# Patient Record
Sex: Female | Born: 1980 | Race: Black or African American | Hispanic: No | Marital: Single | Smoking: Current every day smoker
Health system: Southern US, Community
[De-identification: ages and names within clinical notes are randomized; demographics above are authoritative.]

## PROBLEM LIST (undated history)

## (undated) DIAGNOSIS — F329 Major depressive disorder, single episode, unspecified: Secondary | ICD-10-CM

## (undated) DIAGNOSIS — I10 Essential (primary) hypertension: Secondary | ICD-10-CM

## (undated) DIAGNOSIS — D649 Anemia, unspecified: Secondary | ICD-10-CM

## (undated) DIAGNOSIS — F209 Schizophrenia, unspecified: Secondary | ICD-10-CM

## (undated) DIAGNOSIS — Z98891 History of uterine scar from previous surgery: Secondary | ICD-10-CM

## (undated) DIAGNOSIS — O24419 Gestational diabetes mellitus in pregnancy, unspecified control: Secondary | ICD-10-CM

## (undated) DIAGNOSIS — F4310 Post-traumatic stress disorder, unspecified: Secondary | ICD-10-CM

## (undated) DIAGNOSIS — R519 Headache, unspecified: Secondary | ICD-10-CM

## (undated) DIAGNOSIS — F32A Depression, unspecified: Secondary | ICD-10-CM

## (undated) DIAGNOSIS — K649 Unspecified hemorrhoids: Secondary | ICD-10-CM

## (undated) DIAGNOSIS — R51 Headache: Secondary | ICD-10-CM

## (undated) DIAGNOSIS — F410 Panic disorder [episodic paroxysmal anxiety] without agoraphobia: Secondary | ICD-10-CM

---
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
## 2002-04-01 ENCOUNTER — Emergency Department (HOSPITAL_COMMUNITY): Admission: EM | Admit: 2002-04-01 | Discharge: 2002-04-01 | Payer: Self-pay

## 2002-04-01 ENCOUNTER — Encounter: Payer: Self-pay | Admitting: Emergency Medicine

## 2002-05-20 ENCOUNTER — Inpatient Hospital Stay (HOSPITAL_COMMUNITY): Admission: AD | Admit: 2002-05-20 | Discharge: 2002-05-20 | Payer: Self-pay | Admitting: Obstetrics and Gynecology

## 2002-10-06 ENCOUNTER — Emergency Department (HOSPITAL_COMMUNITY): Admission: EM | Admit: 2002-10-06 | Discharge: 2002-10-06 | Payer: Self-pay | Admitting: Emergency Medicine

## 2002-10-06 ENCOUNTER — Encounter: Payer: Self-pay | Admitting: Emergency Medicine

## 2002-10-06 ENCOUNTER — Inpatient Hospital Stay (HOSPITAL_COMMUNITY): Admission: AD | Admit: 2002-10-06 | Discharge: 2002-10-06 | Payer: Self-pay | Admitting: Obstetrics

## 2002-11-10 ENCOUNTER — Observation Stay (HOSPITAL_COMMUNITY): Admission: AD | Admit: 2002-11-10 | Discharge: 2002-11-11 | Payer: Self-pay | Admitting: Obstetrics

## 2002-12-09 ENCOUNTER — Encounter: Payer: Self-pay | Admitting: Obstetrics

## 2002-12-09 ENCOUNTER — Inpatient Hospital Stay (HOSPITAL_COMMUNITY): Admission: AD | Admit: 2002-12-09 | Discharge: 2002-12-09 | Payer: Self-pay | Admitting: Obstetrics

## 2002-12-21 ENCOUNTER — Inpatient Hospital Stay (HOSPITAL_COMMUNITY): Admission: AD | Admit: 2002-12-21 | Discharge: 2002-12-24 | Payer: Self-pay | Admitting: *Deleted

## 2005-03-02 ENCOUNTER — Emergency Department (HOSPITAL_COMMUNITY): Admission: EM | Admit: 2005-03-02 | Discharge: 2005-03-02 | Payer: Self-pay | Admitting: Family Medicine

## 2005-05-21 ENCOUNTER — Emergency Department (HOSPITAL_COMMUNITY): Admission: EM | Admit: 2005-05-21 | Discharge: 2005-05-21 | Payer: Self-pay | Admitting: Emergency Medicine

## 2006-03-10 ENCOUNTER — Emergency Department (HOSPITAL_COMMUNITY): Admission: EM | Admit: 2006-03-10 | Discharge: 2006-03-10 | Payer: Self-pay | Admitting: Emergency Medicine

## 2006-06-28 ENCOUNTER — Inpatient Hospital Stay (HOSPITAL_COMMUNITY): Admission: AD | Admit: 2006-06-28 | Discharge: 2006-06-30 | Payer: Self-pay | Admitting: Obstetrics

## 2006-09-27 ENCOUNTER — Inpatient Hospital Stay (HOSPITAL_COMMUNITY): Admission: AD | Admit: 2006-09-27 | Discharge: 2006-09-29 | Payer: Self-pay | Admitting: Obstetrics

## 2007-01-01 ENCOUNTER — Emergency Department (HOSPITAL_COMMUNITY): Admission: EM | Admit: 2007-01-01 | Discharge: 2007-01-01 | Payer: Self-pay | Admitting: Family Medicine

## 2007-01-03 ENCOUNTER — Emergency Department (HOSPITAL_COMMUNITY): Admission: EM | Admit: 2007-01-03 | Discharge: 2007-01-03 | Payer: Self-pay | Admitting: Family Medicine

## 2007-01-22 ENCOUNTER — Ambulatory Visit: Payer: Self-pay | Admitting: Gastroenterology

## 2007-04-15 ENCOUNTER — Emergency Department (HOSPITAL_COMMUNITY): Admission: EM | Admit: 2007-04-15 | Discharge: 2007-04-15 | Payer: Self-pay | Admitting: Emergency Medicine

## 2007-10-20 ENCOUNTER — Inpatient Hospital Stay (HOSPITAL_COMMUNITY): Admission: RE | Admit: 2007-10-20 | Discharge: 2007-10-20 | Payer: Self-pay | Admitting: Obstetrics & Gynecology

## 2007-10-22 ENCOUNTER — Encounter: Payer: Self-pay | Admitting: Obstetrics & Gynecology

## 2007-10-22 ENCOUNTER — Ambulatory Visit: Payer: Self-pay | Admitting: Obstetrics & Gynecology

## 2007-11-03 ENCOUNTER — Encounter: Admission: RE | Admit: 2007-11-03 | Discharge: 2007-11-03 | Payer: Self-pay | Admitting: Obstetrics & Gynecology

## 2007-11-03 ENCOUNTER — Ambulatory Visit: Payer: Self-pay | Admitting: Obstetrics & Gynecology

## 2007-11-10 ENCOUNTER — Ambulatory Visit (HOSPITAL_COMMUNITY): Admission: RE | Admit: 2007-11-10 | Discharge: 2007-11-10 | Payer: Self-pay | Admitting: Family Medicine

## 2007-11-10 ENCOUNTER — Ambulatory Visit: Payer: Self-pay | Admitting: Obstetrics & Gynecology

## 2007-12-10 ENCOUNTER — Inpatient Hospital Stay (HOSPITAL_COMMUNITY): Admission: AD | Admit: 2007-12-10 | Discharge: 2007-12-12 | Payer: Self-pay | Admitting: Obstetrics

## 2008-05-03 ENCOUNTER — Emergency Department (HOSPITAL_COMMUNITY): Admission: EM | Admit: 2008-05-03 | Discharge: 2008-05-03 | Payer: Self-pay | Admitting: Emergency Medicine

## 2008-05-05 ENCOUNTER — Emergency Department (HOSPITAL_COMMUNITY): Admission: EM | Admit: 2008-05-05 | Discharge: 2008-05-05 | Payer: Self-pay | Admitting: Emergency Medicine

## 2008-07-21 ENCOUNTER — Emergency Department (HOSPITAL_COMMUNITY): Admission: EM | Admit: 2008-07-21 | Discharge: 2008-07-21 | Payer: Self-pay | Admitting: Family Medicine

## 2008-09-24 DIAGNOSIS — Z98891 History of uterine scar from previous surgery: Secondary | ICD-10-CM

## 2008-09-24 HISTORY — PX: TUBAL LIGATION: SHX77

## 2008-09-24 HISTORY — DX: History of uterine scar from previous surgery: Z98.891

## 2009-01-15 ENCOUNTER — Inpatient Hospital Stay (HOSPITAL_COMMUNITY): Admission: AD | Admit: 2009-01-15 | Discharge: 2009-01-15 | Payer: Self-pay | Admitting: Obstetrics

## 2009-02-06 ENCOUNTER — Inpatient Hospital Stay (HOSPITAL_COMMUNITY): Admission: AD | Admit: 2009-02-06 | Discharge: 2009-02-09 | Payer: Self-pay | Admitting: Obstetrics

## 2009-03-02 ENCOUNTER — Inpatient Hospital Stay (HOSPITAL_COMMUNITY): Admission: AD | Admit: 2009-03-02 | Discharge: 2009-03-03 | Payer: Self-pay | Admitting: Obstetrics

## 2009-03-07 ENCOUNTER — Encounter (INDEPENDENT_AMBULATORY_CARE_PROVIDER_SITE_OTHER): Payer: Self-pay | Admitting: Obstetrics

## 2009-03-07 ENCOUNTER — Inpatient Hospital Stay (HOSPITAL_COMMUNITY): Admission: AD | Admit: 2009-03-07 | Discharge: 2009-03-10 | Payer: Self-pay | Admitting: Obstetrics

## 2009-05-29 ENCOUNTER — Emergency Department (HOSPITAL_COMMUNITY): Admission: EM | Admit: 2009-05-29 | Discharge: 2009-05-29 | Payer: Self-pay | Admitting: Emergency Medicine

## 2009-07-19 ENCOUNTER — Emergency Department (HOSPITAL_COMMUNITY): Admission: EM | Admit: 2009-07-19 | Discharge: 2009-07-19 | Payer: Self-pay | Admitting: Emergency Medicine

## 2009-09-21 ENCOUNTER — Emergency Department (HOSPITAL_COMMUNITY): Admission: EM | Admit: 2009-09-21 | Discharge: 2009-09-21 | Payer: Self-pay | Admitting: Emergency Medicine

## 2009-11-08 ENCOUNTER — Emergency Department (HOSPITAL_COMMUNITY): Admission: EM | Admit: 2009-11-08 | Discharge: 2009-11-08 | Payer: Self-pay | Admitting: Emergency Medicine

## 2009-12-04 ENCOUNTER — Emergency Department (HOSPITAL_COMMUNITY): Admission: EM | Admit: 2009-12-04 | Discharge: 2009-12-04 | Payer: Self-pay | Admitting: Emergency Medicine

## 2010-12-13 LAB — RAPID STREP SCREEN (MED CTR MEBANE ONLY): Streptococcus, Group A Screen (Direct): POSITIVE — AB

## 2010-12-18 LAB — COMPREHENSIVE METABOLIC PANEL
AST: 13 U/L (ref 0–37)
Albumin: 3.4 g/dL — ABNORMAL LOW (ref 3.5–5.2)
BUN: 10 mg/dL (ref 6–23)
Calcium: 8.8 mg/dL (ref 8.4–10.5)
Chloride: 109 mEq/L (ref 96–112)
Creatinine, Ser: 0.54 mg/dL (ref 0.4–1.2)
Total Protein: 7.3 g/dL (ref 6.0–8.3)

## 2010-12-18 LAB — POCT PREGNANCY, URINE

## 2010-12-18 LAB — DIFFERENTIAL
Lymphocytes Relative: 11 % — ABNORMAL LOW (ref 12–46)
Lymphs Abs: 1.5 10*3/uL (ref 0.7–4.0)
Monocytes Absolute: 0.7 10*3/uL (ref 0.1–1.0)
Monocytes Relative: 5 % (ref 3–12)
Neutro Abs: 11.8 10*3/uL — ABNORMAL HIGH (ref 1.7–7.7)
Neutrophils Relative %: 84 % — ABNORMAL HIGH (ref 43–77)

## 2010-12-18 LAB — CBC
HCT: 37.1 % (ref 36.0–46.0)
MCHC: 31.4 g/dL (ref 30.0–36.0)
MCV: 99.8 fL (ref 78.0–100.0)
Platelets: 184 10*3/uL (ref 150–400)
RDW: 14.4 % (ref 11.5–15.5)

## 2010-12-18 LAB — URINALYSIS, ROUTINE W REFLEX MICROSCOPIC
Ketones, ur: NEGATIVE mg/dL
Nitrite: NEGATIVE
Urobilinogen, UA: 1 mg/dL (ref 0.0–1.0)
pH: 7 (ref 5.0–8.0)

## 2010-12-18 LAB — URINE MICROSCOPIC-ADD ON

## 2010-12-28 LAB — DIFFERENTIAL
Lymphocytes Relative: 42 % (ref 12–46)
Lymphs Abs: 3.1 10*3/uL (ref 0.7–4.0)
Monocytes Relative: 5 % (ref 3–12)
Neutro Abs: 3.8 10*3/uL (ref 1.7–7.7)
Neutrophils Relative %: 51 % (ref 43–77)

## 2010-12-28 LAB — URINALYSIS, ROUTINE W REFLEX MICROSCOPIC
Glucose, UA: NEGATIVE mg/dL
Ketones, ur: NEGATIVE mg/dL
Nitrite: NEGATIVE
Protein, ur: NEGATIVE mg/dL
pH: 6.5 (ref 5.0–8.0)

## 2010-12-28 LAB — ETHANOL

## 2010-12-28 LAB — BASIC METABOLIC PANEL
CO2: 25 mEq/L (ref 19–32)
Calcium: 9.3 mg/dL (ref 8.4–10.5)
Creatinine, Ser: 0.64 mg/dL (ref 0.4–1.2)
Sodium: 136 mEq/L (ref 135–145)

## 2010-12-28 LAB — URINE MICROSCOPIC-ADD ON

## 2010-12-28 LAB — CBC
Platelets: 219 10*3/uL (ref 150–400)
RBC: 4.34 MIL/uL (ref 3.87–5.11)
WBC: 7.4 10*3/uL (ref 4.0–10.5)

## 2010-12-28 LAB — POCT PREGNANCY, URINE

## 2010-12-28 LAB — RAPID URINE DRUG SCREEN, HOSP PERFORMED
Amphetamines: NOT DETECTED
Benzodiazepines: NOT DETECTED

## 2010-12-29 LAB — URINALYSIS, ROUTINE W REFLEX MICROSCOPIC
Glucose, UA: NEGATIVE mg/dL
Ketones, ur: NEGATIVE mg/dL
Nitrite: NEGATIVE
Specific Gravity, Urine: 1.025 (ref 1.005–1.030)
pH: 7 (ref 5.0–8.0)

## 2010-12-29 LAB — URINE MICROSCOPIC-ADD ON

## 2010-12-29 LAB — GC/CHLAMYDIA PROBE AMP, GENITAL

## 2010-12-29 LAB — WET PREP, GENITAL

## 2011-01-01 LAB — CBC
Hemoglobin: 10.6 g/dL — ABNORMAL LOW (ref 12.0–15.0)
Hemoglobin: 10.9 g/dL — ABNORMAL LOW (ref 12.0–15.0)
MCHC: 34.4 g/dL (ref 30.0–36.0)
MCV: 101.6 fL — ABNORMAL HIGH (ref 78.0–100.0)
Platelets: 108 10*3/uL — ABNORMAL LOW (ref 150–400)
Platelets: 118 10*3/uL — ABNORMAL LOW (ref 150–400)
RDW: 14.7 % (ref 11.5–15.5)
RDW: 15.1 % (ref 11.5–15.5)
WBC: 7.7 10*3/uL (ref 4.0–10.5)
WBC: 8 10*3/uL (ref 4.0–10.5)

## 2011-01-02 LAB — RPR: RPR Ser Ql: NONREACTIVE

## 2011-01-02 LAB — URINALYSIS, ROUTINE W REFLEX MICROSCOPIC
Bilirubin Urine: NEGATIVE
Glucose, UA: NEGATIVE mg/dL
Ketones, ur: NEGATIVE mg/dL
pH: 6 (ref 5.0–8.0)

## 2011-01-02 LAB — COMPREHENSIVE METABOLIC PANEL
AST: 18 U/L (ref 0–37)
Albumin: 2.6 g/dL — ABNORMAL LOW (ref 3.5–5.2)
Alkaline Phosphatase: 121 U/L — ABNORMAL HIGH (ref 39–117)
Chloride: 108 mEq/L (ref 96–112)
GFR calc Af Amer: >60 mL/min (ref >60)
Potassium: 3.7 mEq/L (ref 3.5–5.1)
Total Bilirubin: 0.3 mg/dL (ref 0.3–1.2)

## 2011-01-02 LAB — CBC
RBC: 3.27 MIL/uL — ABNORMAL LOW (ref 3.87–5.11)
WBC: 7.3 10*3/uL (ref 4.0–10.5)

## 2011-01-02 LAB — FETAL FIBRONECTIN: Fetal Fibronectin: POSITIVE

## 2011-01-02 LAB — URINE MICROSCOPIC-ADD ON

## 2011-01-02 LAB — GLUCOSE, CAPILLARY: Glucose-Capillary: 73 mg/dL (ref 70–99)

## 2011-01-03 LAB — URINALYSIS, ROUTINE W REFLEX MICROSCOPIC
Glucose, UA: NEGATIVE mg/dL
Hgb urine dipstick: NEGATIVE
Ketones, ur: NEGATIVE mg/dL
Protein, ur: NEGATIVE mg/dL

## 2011-02-06 NOTE — Discharge Summary (Signed)
[name], [name]              ACCOUNT NO.:  [id number]   MEDICAL RECORD NO.:  [id number]          PATIENT TYPE:  INP   LOCATION:  9156                          FACILITY:  [facility]   PHYSICIAN:  Kathreen Cosier, M.D.DATE OF BIRTH:  [birth year]   DATE OF ADMISSION:  02/06/2009  DATE OF DISCHARGE:  02/09/2009                               DISCHARGE SUMMARY   The patient is a 30 year old gravida 4, para 3, EDC March 25, 2009, with  twin gestation at 32 weeks and 2, and she was admitted with contractions  and separation of her pubic symphysis.  She was started on magnesium  sulfate and on May 17, magnesium was discontinued.  She was started on  Procardia XL 30 b.i.d.  By May 18, she was contracting again and she was  placed back on magnesium.  She was also given betamethasone, ampicillin,  and erythromycin IV.  She also continued having irritability; however,  she was not feeling the irritability.  White count was 7.3, hemoglobin  10.9, platelets 135.  Sodium 137, potassium 3.7, chloride 108, and  glucose 84.  Fetal fibronectin positive.  Urinalysis was positive.  The  patient tolerated the Procardia well and stopped contracting, and was  discharged on Feb 09, 2009, on Procardia XL 30 p.o. b.i.d. and  ampicillin 500 p.o. q.6 h.   DISCHARGE DIAGNOSIS:  Status post premature contractions with twin  gestation at 33+ weeks.           ______________________________  Kathreen Cosier, M.D.     BAM/MEDQ  D:  02/23/2009  T:  02/23/2009  Job:  [id number]

## 2011-02-06 NOTE — Op Note (Signed)
Stephanie [name], Stephanie [name]              ACCOUNT NO.:  [id number]   MEDICAL RECORD NO.:  [id number]          PATIENT TYPE:  INP   LOCATION:  9104                          [facility]   PHYSICIAN:  Kathreen Cosier, M.D.DATE OF BIRTH:  12/17/1980   DATE OF PROCEDURE:  03/07/2009  DATE OF DISCHARGE:                               OPERATIVE REPORT   PREOPERATIVE DIAGNOSES:  Twin gestation at 37 weeks and 2 days, vaginal  delivery of twin A and cesarean section for twin B because of transverse  lie with back down.   POSTOPERATIVE DIAGNOSES:  Twin gestation at 37 weeks and 2 days, vaginal  delivery of twin A and cesarean section for twin B because of transverse  lie with back down, multiparity, and tubal ligation.   SURGEON:  Kathreen Cosier, MD   ANESTHESIA:  Epidural.   PROCEDURE:  The patient presented at 37 weeks in labor, fully dilated,  twin A vertex, and epidural was placed in the delivery room.  She had a  normal vaginal delivery of twin A female, Apgar 8 and 9, weighing 5  pounds 3 ounces at 7:42 a.m.  On examination, the cervix, ovary rest of  4 cm, and there was a transverse lie and hand could be palpated at the  cervix.  The feet were inaccessible and it was decided she would be  delivered by a C-section for transverse lie with a hand presenting.  So,  the patient was taken to the operating room.  Abdomen prepped and  draped, bladder emptied with Foley catheter.  Transverse suprapubic  incision was made, carried down to rectus fascia.  Fascia cleaned and  incised length of incision.  Recti muscles were retracted laterally.  Peritoneum incised longitudinally.  She had a large number of a very  large vessels in the lower segment.  Transverse lower uterine incision  was made after the bladder was reduced, and after the transverse  incision made, it was noted that she had a frank breech with the back  down and a hand at the cervix with some difficulty, both feet were  grasped, and she was delivered as a frank breech, female, Apgar were 8 and  9, weighing 6 pounds 1 ounce.  Team was in attendance and the placentas  were fundal, removed manually, and sent to Pathology.  Uterine cavity  cleaned with dry laps.  Uterine incision was closed in one layer with  continuous suture of #1 chromic.  Hemostasis was satisfactory.  Bladder  flap reattached with 2-0 chromic.  The right tube grasped in the  midportion with Babcock clamp.  Zero plain suture was placed in the  mesosalpinx below the portion of tube within the clamp.  This was tied  with 0 plain and cut and sent to Pathology.  The procedure done in a  similar fashion the other side.  Lap and sponge counts were correct.  Abdomen closed in layers, peritoneum continuous suture of 0 chromic  fascia, continuous suture with Dexon and the skin was closed with  subcuticular stitch of 4-0 Monocryl.  The blood loss was 1500  mL.  The  patient tolerated the procedure well, taken to recovery room in good  condition.           ______________________________  Kathreen Cosier, M.D.     BAM/MEDQ  D:  03/07/2009  T:  03/07/2009  Job:  [id number]

## 2011-02-06 NOTE — Discharge Summary (Signed)
Stephanie [name], Stephanie [name]              ACCOUNT NO.:  [id number]   MEDICAL RECORD NO.:  [id number]          PATIENT TYPE:  INP   LOCATION:  9104                          [facility]   PHYSICIAN:  Kathreen Cosier, M.D.DATE OF BIRTH:  10-11-1980   DATE OF ADMISSION:  03/07/2009  DATE OF DISCHARGE:                               DISCHARGE SUMMARY   The patient is a 30 year old, gravida 4, para 3-0-0-3, [facility] March 25, 2009  pregnant with twins.  Twin A vertex, twin B transverse.  She was  admitted fully dilated with 0 station.  She got an epidural and then  have vaginal delivery of twin A, Apgar 8 and 9, weighing 5 pounds 3  ounces at 7:42 a.m..   On examination, after twin A was delivered, the cervix was back to 4 cm.  There was a IM presenting at the cervix and a transverse lie unable to  grasp the legs, so it was decided she would be delivered by C-section  for twin B.  At the time of C-section, the twin B transverse lower  incision was made and it was discovered she had a transverse lie over  the back down frank breech, however, that with some difficulty.  Twin B  was delivered as a breech Apgar 8 and 9, weighing 6 pound 1 ounce.  The  fundal placenta was removed manually and sent to pathology.  She had a  tubal ligation done.  Her hemoglobin was 7.5.  She was asymptomatic and  was discharged on the third postoperative day ambulatory on a regular  diet to see me in 6 weeks.   DISCHARGE DIAGNOSES:  Status post vaginal delivery of twin A on cesarean  section, twin B because of transverse lie on the background, multiparity  tubal ligation.           ______________________________  Kathreen Cosier, M.D.     BAM/MEDQ  D:  03/10/2009  T:  03/10/2009  Job:  [id number]

## 2011-02-09 NOTE — Discharge Summary (Signed)
Stephanie [name], Stephanie [name]              ACCOUNT NO.:  [id number]   MEDICAL RECORD NO.:  [id number]          PATIENT TYPE:  INP   LOCATION:  9105                          [facility]   PHYSICIAN:  Kathreen Cosier, M.D.DATE OF BIRTH:  10-03-1980   DATE OF ADMISSION:  06/28/2006  DATE OF DISCHARGE:  06/30/2006                                 DISCHARGE SUMMARY   The patient is a 30 year old gravida 2, para 1-0-0-1, EDC September 28, 2005  who was admitted with a temperature elevation, white count of 12.8,  platelets 178,000, sodium 133, potassium 3.5, chloride 100 and urine showed  leukocyte esterase, 7-10 white blood cells, few bacteria.  The patient had  severe right flank pain and she was started on ampicillin and gentamicin IV.  She rapidly defervesced and over the next two days felt better.  She was  discharged home on June 30, 2006 afebrile for greater than 24 hours on  ampicillin 500 p.o. every six hours for 10 days.   DISCHARGE DIAGNOSES:  1. Intrauterine pregnancy.  2. Pyelonephritis.           ______________________________  Kathreen Cosier, M.D.     BAM/MEDQ  D:  07/17/2006  T:  07/17/2006  Job:  [id number]

## 2011-02-09 NOTE — Assessment & Plan Note (Signed)
HEALTHCARE                         GASTROENTEROLOGY OFFICE NOTE   Stephanie, [name]                       MRN:          [id number]  DATE:01/22/2007                            DOB:          1981-07-04    REFERRING PHYSICIAN:  Kathreen [name], M.D.   OFFICE CONSULTATION:   REASON FOR REFERRAL:  Constipation and hemorrhoids.   HISTORY OF PRESENT ILLNESS:  [age]-year-old female who relates  problems with rectal discomfort and rectal pressure associated with  constipation.  She was seen in [facility] on April 9  and April 11 for the above complaints.  The examination on the 11th  revealed a small external hemorrhoids with a lesion on rectal exam  consistent with an internal hemorrhoid.  She was advised to take  ibuprofen, MiraLax, AnaMantle and dibucaine ointment.  Her symptoms have  completely resolved.  She has not noted any rectal bleeding, change in  stool caliber or abdominal pain.  She is 3 months postpartum.  No family  history of colon cancer, colon polyps or inflammatory bowel disease.   PAST MEDICAL HISTORY:  As in history of present illness.   PAST SURGICAL HISTORY:  Negative.   CURRENT MEDICATIONS:  Listed on the chart, updated and reviewed.   MEDICATION ALLERGIES:  None known.   SOCIAL HISTORY:  Per the handwritten form.   REVIEW OF SYSTEMS:  Per the handwritten form.   PHYSICAL EXAMINATION:  A well-developed, well-nourished African American  female in no acute distress.  Height 5 feet 6 inches.  Weight 167 pounds.  Blood pressure is 112/72,  pulse 64 and regular.  HEENT:  Anicteric sclerae.  Oropharynx clear.  CHEST:  Clear to auscultation bilaterally.  CARDIAC:  Regular rate and rhythm without murmurs.  ABDOMEN:  Soft, nontender and non-distended.  Normoactive bowel sounds.  No palpable organomegaly, masses or hernias.  RECTAL:  Examination reveals a small external hemorrhoidal tag, no  internal lesions and Hemoccult-negative stool in the vault.   ASSESSMENT AND PLAN:  1. Internal and external hemorrhoids. Constipation. Given that the      abnormality noted on her recent rectal examination has now      resolved, I strongly suspect it was a hemorrhoid.  If she has      further symptoms, a flexible sigmoidoscopy could be performed for      further evaluation.  At this time, I have asked her to remain on a      long-term high-fiber diet with daily fiber supplements and      increased fluid intake.  She may use MiraLax daily on an as-needed      basis for management of constipation that is not controlled with      dietary fiber and water.  She may reuse her current prescriptions      for hemorrhoidal management if her symptoms return.  I have advised      her to return to the care of Dr. Francoise [name] and I will see      back on referral.     Stephanie Petit T. Russella Dar, MD, [facility]  Electronically Signed    MTS/MedQ  DD: 01/22/2007  DT: 01/22/2007  Job #: [id number]   cc:   Stephanie [name], M.D.

## 2011-03-14 ENCOUNTER — Emergency Department (HOSPITAL_COMMUNITY)
Admission: EM | Admit: 2011-03-14 | Discharge: 2011-03-14 | Disposition: A | Discharge status: Home / Self Care | Payer: Self-pay | Attending: Emergency Medicine | Admitting: Emergency Medicine

## 2011-03-14 DIAGNOSIS — F3289 Other specified depressive episodes: Secondary | ICD-10-CM | POA: Insufficient documentation

## 2011-03-14 DIAGNOSIS — N949 Unspecified condition associated with female genital organs and menstrual cycle: Secondary | ICD-10-CM | POA: Insufficient documentation

## 2011-03-14 DIAGNOSIS — N898 Other specified noninflammatory disorders of vagina: Secondary | ICD-10-CM | POA: Insufficient documentation

## 2011-03-14 DIAGNOSIS — F329 Major depressive disorder, single episode, unspecified: Secondary | ICD-10-CM | POA: Insufficient documentation

## 2011-03-14 DIAGNOSIS — A5901 Trichomonal vulvovaginitis: Secondary | ICD-10-CM | POA: Insufficient documentation

## 2011-03-14 LAB — URINALYSIS, ROUTINE W REFLEX MICROSCOPIC
Bilirubin Urine: NEGATIVE
Nitrite: NEGATIVE
Protein, ur: NEGATIVE mg/dL
Specific Gravity, Urine: 1.02 (ref 1.005–1.030)
Urobilinogen, UA: 1 mg/dL (ref 0.0–1.0)

## 2011-03-14 LAB — WET PREP, GENITAL: Clue Cells Wet Prep HPF POC: NONE SEEN

## 2011-03-14 LAB — POCT PREGNANCY, URINE: Preg Test, Ur: NEGATIVE

## 2011-03-14 LAB — URINE MICROSCOPIC-ADD ON

## 2011-03-15 ENCOUNTER — Emergency Department (HOSPITAL_COMMUNITY): Payer: Self-pay

## 2011-03-15 ENCOUNTER — Emergency Department (HOSPITAL_COMMUNITY)
Admission: EM | Admit: 2011-03-15 | Discharge: 2011-03-15 | Disposition: A | Discharge status: Home / Self Care | Payer: Self-pay | Attending: Emergency Medicine | Admitting: Emergency Medicine

## 2011-03-15 DIAGNOSIS — J3489 Other specified disorders of nose and nasal sinuses: Secondary | ICD-10-CM | POA: Insufficient documentation

## 2011-03-15 DIAGNOSIS — R05 Cough: Secondary | ICD-10-CM | POA: Insufficient documentation

## 2011-03-15 DIAGNOSIS — R51 Headache: Secondary | ICD-10-CM | POA: Insufficient documentation

## 2011-03-15 DIAGNOSIS — R5381 Other malaise: Secondary | ICD-10-CM | POA: Insufficient documentation

## 2011-03-15 DIAGNOSIS — R062 Wheezing: Secondary | ICD-10-CM | POA: Insufficient documentation

## 2011-03-15 DIAGNOSIS — R059 Cough, unspecified: Secondary | ICD-10-CM | POA: Insufficient documentation

## 2011-03-15 DIAGNOSIS — IMO0001 Reserved for inherently not codable concepts without codable children: Secondary | ICD-10-CM | POA: Insufficient documentation

## 2011-03-15 DIAGNOSIS — Z8659 Personal history of other mental and behavioral disorders: Secondary | ICD-10-CM | POA: Insufficient documentation

## 2011-03-15 DIAGNOSIS — F3289 Other specified depressive episodes: Secondary | ICD-10-CM | POA: Insufficient documentation

## 2011-03-15 DIAGNOSIS — R509 Fever, unspecified: Secondary | ICD-10-CM | POA: Insufficient documentation

## 2011-03-15 DIAGNOSIS — R0989 Other specified symptoms and signs involving the circulatory and respiratory systems: Secondary | ICD-10-CM | POA: Insufficient documentation

## 2011-03-15 DIAGNOSIS — R0609 Other forms of dyspnea: Secondary | ICD-10-CM | POA: Insufficient documentation

## 2011-03-15 DIAGNOSIS — J069 Acute upper respiratory infection, unspecified: Secondary | ICD-10-CM | POA: Insufficient documentation

## 2011-03-15 DIAGNOSIS — R110 Nausea: Secondary | ICD-10-CM | POA: Insufficient documentation

## 2011-03-15 DIAGNOSIS — R070 Pain in throat: Secondary | ICD-10-CM | POA: Insufficient documentation

## 2011-03-15 DIAGNOSIS — F329 Major depressive disorder, single episode, unspecified: Secondary | ICD-10-CM | POA: Insufficient documentation

## 2011-03-15 LAB — GC/CHLAMYDIA PROBE AMP, GENITAL: Chlamydia, DNA Probe: NEGATIVE

## 2011-06-14 LAB — COMPREHENSIVE METABOLIC PANEL
ALT: 10
Alkaline Phosphatase: 91
BUN: 6
CO2: 22
Chloride: 108
Glucose, Bld: 91
Potassium: 3.4 — ABNORMAL LOW
Sodium: 136
Total Bilirubin: 0.4

## 2011-06-14 LAB — DIFFERENTIAL
Basophils Absolute: 0
Basophils Relative: 0
Eosinophils Absolute: 0
Neutro Abs: 5.2
Neutrophils Relative %: 67

## 2011-06-14 LAB — RAPID URINE DRUG SCREEN, HOSP PERFORMED
Amphetamines: NOT DETECTED
Tetrahydrocannabinol: POSITIVE — AB

## 2011-06-14 LAB — URINALYSIS, ROUTINE W REFLEX MICROSCOPIC
Bilirubin Urine: NEGATIVE
Hgb urine dipstick: NEGATIVE
Ketones, ur: NEGATIVE
Nitrite: NEGATIVE
Urobilinogen, UA: 0.2

## 2011-06-14 LAB — TYPE AND SCREEN
ABO/RH(D): O POS
Antibody Screen: NEGATIVE

## 2011-06-14 LAB — POCT URINALYSIS DIP (DEVICE)
Ketones, ur: NEGATIVE
Nitrite: NEGATIVE
Operator id: 297281
Protein, ur: 30 — AB
pH: 6.5

## 2011-06-14 LAB — CBC
HCT: 29.8 — ABNORMAL LOW
Hemoglobin: 10.2 — ABNORMAL LOW
RBC: 2.96 — ABNORMAL LOW
RDW: 13.5
WBC: 7.8

## 2011-06-14 LAB — ABO/RH: ABO/RH(D): O POS

## 2011-06-14 LAB — HIV ANTIBODY (ROUTINE TESTING W REFLEX): HIV: NONREACTIVE

## 2011-06-14 LAB — SICKLE CELL SCREEN: Sickle Cell Screen: NEGATIVE

## 2011-06-14 LAB — RUBELLA SCREEN: Rubella: 11.6 — ABNORMAL HIGH

## 2011-06-15 LAB — POCT URINALYSIS DIP (DEVICE)
Bilirubin Urine: NEGATIVE
Ketones, ur: NEGATIVE
Ketones, ur: NEGATIVE
Nitrite: NEGATIVE
Nitrite: NEGATIVE
Operator id: 148111
Operator id: 120861
Protein, ur: 30 — AB
Protein, ur: 30 — AB
Urobilinogen, UA: 1
pH: 7
pH: 8.5 — ABNORMAL HIGH

## 2011-06-18 LAB — CBC
Hemoglobin: 9.8 — ABNORMAL LOW
MCHC: 33.5
Platelets: 162
RBC: 2.92 — ABNORMAL LOW
RDW: 13.3
WBC: 8.2
WBC: 9.5

## 2011-06-18 LAB — RPR: RPR Ser Ql: NONREACTIVE

## 2011-06-22 LAB — GLUCOSE, CAPILLARY: Glucose-Capillary: 77

## 2011-06-22 LAB — OCCULT BLOOD X 1 CARD TO LAB, STOOL: Fecal Occult Bld: NEGATIVE

## 2011-06-25 LAB — POCT PREGNANCY, URINE: Preg Test, Ur: POSITIVE

## 2011-07-09 LAB — POCT PREGNANCY, URINE
Operator id: 277751
Preg Test, Ur: POSITIVE

## 2011-07-09 LAB — URINALYSIS, ROUTINE W REFLEX MICROSCOPIC
Glucose, UA: NEGATIVE
Hgb urine dipstick: NEGATIVE
Specific Gravity, Urine: 1.028

## 2011-07-09 LAB — DIFFERENTIAL
Basophils Relative: 1
Eosinophils Relative: 0
Lymphocytes Relative: 31
Monocytes Absolute: 0.5
Monocytes Relative: 6
Neutro Abs: 5.5

## 2011-07-09 LAB — URINE MICROSCOPIC-ADD ON

## 2011-07-09 LAB — COMPREHENSIVE METABOLIC PANEL
AST: 11
Albumin: 4.2
Alkaline Phosphatase: 48
BUN: 9
GFR calc Af Amer: >60
Potassium: 3.6
Total Protein: 7.1

## 2011-07-09 LAB — CBC
HCT: 38.6
Platelets: 211
RDW: 13.6
WBC: 8.7

## 2011-07-09 LAB — URINE CULTURE

## 2011-09-16 ENCOUNTER — Emergency Department (HOSPITAL_COMMUNITY): Payer: Self-pay

## 2011-09-16 ENCOUNTER — Emergency Department (HOSPITAL_COMMUNITY)
Admission: EM | Admit: 2011-09-16 | Discharge: 2011-09-16 | Disposition: A | Discharge status: Home / Self Care | Payer: Self-pay | Attending: Emergency Medicine | Admitting: Emergency Medicine

## 2011-09-16 ENCOUNTER — Encounter: Payer: Self-pay | Admitting: *Deleted

## 2011-09-16 DIAGNOSIS — F419 Anxiety disorder, unspecified: Secondary | ICD-10-CM

## 2011-09-16 DIAGNOSIS — F17200 Nicotine dependence, unspecified, uncomplicated: Secondary | ICD-10-CM | POA: Insufficient documentation

## 2011-09-16 DIAGNOSIS — R059 Cough, unspecified: Secondary | ICD-10-CM | POA: Insufficient documentation

## 2011-09-16 DIAGNOSIS — R05 Cough: Secondary | ICD-10-CM | POA: Insufficient documentation

## 2011-09-16 DIAGNOSIS — R6889 Other general symptoms and signs: Secondary | ICD-10-CM | POA: Insufficient documentation

## 2011-09-16 DIAGNOSIS — F411 Generalized anxiety disorder: Secondary | ICD-10-CM | POA: Insufficient documentation

## 2011-09-16 DIAGNOSIS — R109 Unspecified abdominal pain: Secondary | ICD-10-CM | POA: Insufficient documentation

## 2011-09-16 DIAGNOSIS — R51 Headache: Secondary | ICD-10-CM | POA: Insufficient documentation

## 2011-09-16 LAB — RAPID URINE DRUG SCREEN, HOSP PERFORMED
Barbiturates: NOT DETECTED
Benzodiazepines: NOT DETECTED
Cocaine: NOT DETECTED
Opiates: NOT DETECTED

## 2011-09-16 LAB — BASIC METABOLIC PANEL
Calcium: 9.5 mg/dL (ref 8.4–10.5)
GFR calc non Af Amer: >90 mL/min (ref >90)
Glucose, Bld: 75 mg/dL (ref 70–99)
Potassium: 3.6 mEq/L (ref 3.5–5.1)
Sodium: 140 mEq/L (ref 135–145)

## 2011-09-16 LAB — CBC
Hemoglobin: 13.7 g/dL (ref 12.0–15.0)
MCH: 33.2 pg (ref 26.0–34.0)
MCHC: 33.3 g/dL (ref 30.0–36.0)
Platelets: 151 10*3/uL (ref 150–400)
RBC: 4.13 MIL/uL (ref 3.87–5.11)

## 2011-09-16 LAB — URINALYSIS, ROUTINE W REFLEX MICROSCOPIC
Bilirubin Urine: NEGATIVE
Glucose, UA: NEGATIVE mg/dL
Hgb urine dipstick: NEGATIVE
Nitrite: NEGATIVE
Specific Gravity, Urine: 1.024 (ref 1.005–1.030)
pH: 6.5 (ref 5.0–8.0)

## 2011-09-16 MED ORDER — LIDOCAINE HCL 2 % IJ SOLN
INTRAMUSCULAR | Status: AC
  Administered 2011-09-16: 20 mg
  Filled 2011-09-16: qty 1

## 2011-09-16 MED ORDER — OXYCODONE-ACETAMINOPHEN 5-325 MG PO TABS
1.0000 | ORAL_TABLET | Freq: Once | ORAL | Status: AC
  Administered 2011-09-16: 1 via ORAL
  Filled 2011-09-16: qty 1

## 2011-09-16 MED ORDER — ONDANSETRON 4 MG PO TBDP
4.0000 mg | ORAL_TABLET | Freq: Once | ORAL | Status: AC
  Administered 2011-09-16: 4 mg via ORAL
  Filled 2011-09-16: qty 1

## 2011-09-16 MED ORDER — LORAZEPAM 1 MG PO TABS
1.0000 mg | ORAL_TABLET | Freq: Once | ORAL | Status: AC
  Administered 2011-09-16: 1 mg via ORAL
  Filled 2011-09-16: qty 1

## 2011-09-16 MED ORDER — IBUPROFEN 800 MG PO TABS
800.0000 mg | ORAL_TABLET | Freq: Once | ORAL | Status: AC
  Administered 2011-09-16: 800 mg via ORAL
  Filled 2011-09-16: qty 1

## 2011-09-16 MED ORDER — AZITHROMYCIN 250 MG PO TABS
1000.0000 mg | ORAL_TABLET | Freq: Once | ORAL | Status: AC
  Administered 2011-09-16: 1000 mg via ORAL
  Filled 2011-09-16: qty 4

## 2011-09-16 MED ORDER — METRONIDAZOLE 500 MG PO TABS
2000.0000 mg | ORAL_TABLET | Freq: Once | ORAL | Status: AC
  Administered 2011-09-16: 2000 mg via ORAL
  Filled 2011-09-16: qty 4

## 2011-09-16 MED ORDER — CEFTRIAXONE SODIUM 250 MG IJ SOLR
250.0000 mg | Freq: Once | INTRAMUSCULAR | Status: AC
  Administered 2011-09-16: 250 mg via INTRAMUSCULAR
  Filled 2011-09-16 (×2): qty 250

## 2011-09-16 MED ORDER — BENZONATATE 100 MG PO CAPS: 100.0000 mg | ORAL_CAPSULE | Freq: Three times a day (TID) | ORAL | Status: AC

## 2011-09-16 NOTE — ED Notes (Signed)
Reports headache for several days, having a cough and lower abd pain with vaginal discharge. No acute distress noted at triage.

## 2011-09-16 NOTE — ED Notes (Signed)
This rn attempted to assess pt, pt was on our phone talking, did not acknowledge this rn

## 2011-09-16 NOTE — ED Notes (Signed)
Paged act team again

## 2011-09-16 NOTE — ED Provider Notes (Signed)
History     CSN: [id number]  Arrival date & time 09/16/11  1129   First MD Initiated Contact with Patient 09/16/11 1249      Chief Complaint  Patient presents with  . Abdominal Pain  . Cough  . Headache    (Consider location/radiation/quality/duration/timing/severity/associated sxs/prior treatment) Patient is a 30 y.o. female presenting with abdominal pain, cough, and headaches.  Abdominal Pain The primary symptoms of the illness include abdominal pain.  Cough Associated symptoms include headaches.  Headache     Pt presents to the ED with complaints abdominal pain, headache, cough. Pt states that she is not having vaginal discharge but was told that she has an STD by someone and would like to be treated. Pt has not tried taking anything for the STD. Upon my arrival the patient is upset that she had waited two hours and yelled at me while crying. She believes that she has been mistreated because she doesn't have medicaid. Pt states that she is hungry, has PTSD, panic attacks and schizophrenia. She also states that she does not take her medication like she is supposed to. Pt is upset but not in distress.  History reviewed. No pertinent past medical history.  History reviewed. No pertinent past surgical history.  History reviewed. No pertinent family history.  History  Substance Use Topics  . Smoking status: Current Everyday Smoker  . Smokeless tobacco: Not on file  . Alcohol Use: Yes    OB History    Grav Para Term Preterm Abortions TAB SAB Ect Mult Living                  Review of Systems  Respiratory: Positive for cough.   Gastrointestinal: Positive for abdominal pain.  Neurological: Positive for headaches.    Allergies  Review of patient's allergies indicates no known allergies.  Home Medications  No current outpatient prescriptions on file.  BP 109/81  Pulse 56  Temp(Src) 98.6 F (37 C) (Oral)  Resp 20  SpO2 100%  LMP 08/27/2011  Physical Exam    Constitutional: She is oriented to person, place, and time. She appears well-developed and well-nourished.  HENT:  Head: Normocephalic and atraumatic.  Eyes: Conjunctivae are normal. Pupils are equal, round, and reactive to light.  Neck: Trachea normal, normal range of motion and full passive range of motion without pain. Neck supple.  Cardiovascular: Normal rate, regular rhythm and normal pulses.   Pulmonary/Chest: Effort normal and breath sounds normal. No respiratory distress (pt is coughint during exam). She has no wheezes. She has no rales. Chest wall is not dull to percussion. She exhibits no tenderness, no crepitus, no edema, no deformity and no retraction.  Abdominal: Soft. Normal appearance and bowel sounds are normal.  Musculoskeletal: Normal range of motion.  Neurological: She is oriented to person, place, and time. She has normal strength.  Skin: Skin is warm, dry and intact.  Psychiatric: Her speech is normal. Judgment and thought content normal. Her mood appears anxious. Her affect is angry and labile. She is agitated and aggressive. Cognition and memory are normal. She exhibits a depressed mood.    ED Course  Procedures (including critical care time)  Labs Reviewed  URINALYSIS, ROUTINE W REFLEX MICROSCOPIC - Abnormal; Notable for the following:    Ketones, ur 15 (*)    All other components within normal limits  CBC  BASIC METABOLIC PANEL  ETHANOL  POCT PREGNANCY, URINE  POCT PREGNANCY, URINE  URINE RAPID DRUG SCREEN (HOSP PERFORMED)  Dg Chest 2 View  09/16/2011  *RADIOLOGY REPORT*  Clinical Data: Cough.  Chest pain.  Nausea.  CHEST - 2 VIEW  Comparison:  03/15/2011  Findings:  The heart size and mediastinal contours are within normal limits.  Both lungs are clear.  The visualized skeletal structures are unremarkable.  IMPRESSION: No active cardiopulmonary disease.  Original Report Authenticated By: Danae Orleans, M.D.     No diagnosis found.    MDM  pts  symptoms consistent with the flu. A psych consult was ordered for the patient because of her psych complaints and being agitated upon my arrival. however, we had a hard time contacting them, it took 3 hours. The patient is not homocidal or suicidal and requests to leave and have some outpatient resources. Pt is in a good mood at discharge and stable.       Dorthula Matas, PA 09/16/11 1544

## 2011-09-16 NOTE — ED Notes (Signed)
MD at bedside. Tiffany EDPA at bedside, pt yelling and screaming at Cecil, Elmarie Shiley stepped out of room and went back in w/Kristie charge RN to assess

## 2011-09-16 NOTE — ED Notes (Signed)
Pt dc'd home in good condition, ambulatory, dc instructions completed, family is picking pt up. Unable to obtain this feature in epic

## 2011-09-16 NOTE — ED Notes (Signed)
Pt complains of headache, abd pain and cough with upper respiratory irritation. Onset three days ago. And getting worse, sts cough produces phlegm clear in color, admits to smoking x 3-4 ciggarrettes a day and uses marijuana and beer daily.

## 2011-09-16 NOTE — ED Notes (Signed)
Paged Act Team to T. Neva Seat @ [phone]

## 2011-09-18 NOTE — ED Provider Notes (Signed)
Medical screening examination/treatment/procedure(s) were performed by non-physician practitioner and as supervising physician I was immediately available for consultation/collaboration.  Shelda Jakes, MD 09/18/11 [phone]

## 2011-12-20 ENCOUNTER — Encounter (HOSPITAL_COMMUNITY): Payer: Self-pay | Admitting: Cardiology

## 2011-12-20 ENCOUNTER — Emergency Department (INDEPENDENT_AMBULATORY_CARE_PROVIDER_SITE_OTHER)
Admission: EM | Admit: 2011-12-20 | Discharge: 2011-12-20 | Disposition: A | Discharge status: Home / Self Care | Payer: Self-pay | Source: Home / Self Care | Attending: Family Medicine | Admitting: Family Medicine

## 2011-12-20 DIAGNOSIS — R51 Headache: Secondary | ICD-10-CM

## 2011-12-20 DIAGNOSIS — B86 Scabies: Secondary | ICD-10-CM

## 2011-12-20 HISTORY — DX: History of uterine scar from previous surgery: Z98.891

## 2011-12-20 MED ORDER — PERMETHRIN 5 % EX CREA: TOPICAL_CREAM | CUTANEOUS | Status: AC
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
## 2011-12-20 NOTE — Discharge Instructions (Signed)
Use cream as directed. Leave on for at least 8 hours (8 - 14 hours is optimal) before washing off. You may repeat after one week. You must wash all clothing in the highest temperature setting on your washer. Also, spray all cloth (or porous) surfaces such as furniture, with Rid X or Nix. Please call [facility] and ask about providers that accept Medicaid (tell the provider office that yours is pending), and establish care with a primary care provider for further evaluation and management of your headaches. Also, please call [facility] (again, inform them that your Medicaid is pending) and make an appointment for evaluation regarding your abnormal vaginal bleeding.

## 2011-12-20 NOTE — ED Notes (Addendum)
Pt reports abd pain and headache for the past week along with fine rash all over her body that is itchy. Pt reports rash started on arms then went all over her body. Denies fever. Normal bowel movements. Tolerating PO intake. Pt has a history of c-section 3 years and has had abd pain since that time off and on. Pt has not seen OB-GYN for this problem. Pt also reports she has been having 2 periods a month for the past year that are regular type periods.

## 2012-01-06 ENCOUNTER — Encounter (HOSPITAL_COMMUNITY): Payer: Self-pay

## 2012-01-06 ENCOUNTER — Inpatient Hospital Stay (HOSPITAL_COMMUNITY)
Admission: AD | Admit: 2012-01-06 | Discharge: 2012-01-06 | Disposition: A | Discharge status: Home / Self Care | Payer: Self-pay | Source: Ambulatory Visit | Attending: Obstetrics & Gynecology | Admitting: Obstetrics & Gynecology

## 2012-01-06 DIAGNOSIS — N760 Acute vaginitis: Secondary | ICD-10-CM | POA: Insufficient documentation

## 2012-01-06 DIAGNOSIS — M25473 Effusion, unspecified ankle: Secondary | ICD-10-CM | POA: Insufficient documentation

## 2012-01-06 DIAGNOSIS — A499 Bacterial infection, unspecified: Secondary | ICD-10-CM | POA: Insufficient documentation

## 2012-01-06 DIAGNOSIS — M25476 Effusion, unspecified foot: Secondary | ICD-10-CM | POA: Insufficient documentation

## 2012-01-06 DIAGNOSIS — R109 Unspecified abdominal pain: Secondary | ICD-10-CM | POA: Insufficient documentation

## 2012-01-06 DIAGNOSIS — M7989 Other specified soft tissue disorders: Secondary | ICD-10-CM | POA: Insufficient documentation

## 2012-01-06 DIAGNOSIS — M25579 Pain in unspecified ankle and joints of unspecified foot: Secondary | ICD-10-CM | POA: Insufficient documentation

## 2012-01-06 DIAGNOSIS — B9689 Other specified bacterial agents as the cause of diseases classified elsewhere: Secondary | ICD-10-CM | POA: Insufficient documentation

## 2012-01-06 LAB — POCT PREGNANCY, URINE: Preg Test, Ur: NEGATIVE

## 2012-01-06 LAB — URINALYSIS, ROUTINE W REFLEX MICROSCOPIC
Glucose, UA: NEGATIVE mg/dL
Hgb urine dipstick: NEGATIVE
Leukocytes, UA: NEGATIVE
Protein, ur: NEGATIVE mg/dL
Specific Gravity, Urine: >1.03 — ABNORMAL HIGH (ref 1.005–1.030)
pH: 5.5 (ref 5.0–8.0)

## 2012-01-06 LAB — WET PREP, GENITAL
Trich, Wet Prep: NONE SEEN
Yeast Wet Prep HPF POC: NONE SEEN

## 2012-01-06 MED ORDER — METRONIDAZOLE 500 MG PO TABS: 500.0000 mg | ORAL_TABLET | Freq: Two times a day (BID) | ORAL | Status: AC

## 2012-01-06 NOTE — MAU Provider Note (Signed)
History     CSN: [id number]  Arrival date and time: 01/06/12 0758   None     Chief Complaint  Patient presents with  . Abdominal Pain  . Leg Swelling   HPI 31 y.o. [id number] with multiple complaints.   1. Swollen ankles, left worse than right, some pain/pressure in feet, gets worse at work (on her feet all day, works at [employer]). No difficulty with walking.   2. Abdominal pain - mid abdomen, ongoing since about 6 months after last c/s (in 2010), nausea and diarrhea x 2 days  3. Irregular periods - states she has "2 periods per month", not heavy or painful, has varying cycle length by about 7 days, but not bleeding every 2 weeks    Past Medical History  Diagnosis Date  . H/O cesarean section 2010  . Vaginal delivery 2004 , 2008, 2009    Past Surgical History  Procedure Date  . Tubal ligation 2010    Family History  Problem Relation Age of Onset  . Hypertension Other     History  Substance Use Topics  . Smoking status: Current Everyday Smoker -- 0.5 packs/day  . Smokeless tobacco: Not on file  . Alcohol Use: Yes     1 beer a day    Allergies: No Known Allergies  No prescriptions prior to admission    Review of Systems  Constitutional: Negative.   Respiratory: Negative.   Cardiovascular: Negative.   Gastrointestinal: Positive for nausea, abdominal pain and diarrhea. Negative for vomiting and constipation.  Genitourinary: Negative for dysuria, urgency, frequency, hematuria and flank pain.       Negative for vaginal bleeding, vaginal discharge, dyspareunia  Musculoskeletal: Negative.   Neurological: Negative.   Psychiatric/Behavioral: Negative.    Physical Exam   Last menstrual period 12/16/2011.  Physical Exam  Nursing note and vitals reviewed. Constitutional: She is oriented to person, place, and time. She appears well-developed and well-nourished. No distress.  HENT:  Head: Normocephalic and atraumatic.  Cardiovascular: Normal rate, regular  rhythm and normal heart sounds.   Respiratory: Effort normal and breath sounds normal. No respiratory distress.  GI: Soft. Bowel sounds are normal. She exhibits no distension and no mass. There is no tenderness. There is no rebound and no guarding.  Genitourinary: There is no rash or lesion on the right labia. There is no rash or lesion on the left labia. Uterus is not deviated, not enlarged, not fixed and not tender. Cervix exhibits no motion tenderness, no discharge and no friability. Right adnexum displays no mass, no tenderness and no fullness. Left adnexum displays no mass, no tenderness and no fullness. No erythema, tenderness or bleeding around the vagina. Vaginal discharge (white, malodorous) found.  Musculoskeletal: Normal range of motion. She exhibits edema (mild, left ankle). She exhibits no tenderness.       Right ankle: Normal.       Left ankle: She exhibits swelling. She exhibits normal range of motion. no tenderness.       Right foot: Normal.       Left foot: Normal. She exhibits normal range of motion and no tenderness.  Neurological: She is alert and oriented to person, place, and time.  Skin: Skin is warm and dry.  Psychiatric: She has a normal mood and affect.    MAU Course  Procedures  Results for orders placed during the hospital encounter of 01/06/12 (from the past 24 hour(s))  URINALYSIS, ROUTINE W REFLEX MICROSCOPIC     Status: Abnormal  Collection Time   01/06/12  8:05 AM      Component Value Range   Color, Urine YELLOW  YELLOW    APPearance CLEAR  CLEAR    Specific Gravity, Urine >1.030 (*) 1.005 - 1.030    pH 5.5  5.0 - 8.0    Glucose, UA NEGATIVE  NEGATIVE (mg/dL)   Hgb urine dipstick NEGATIVE  NEGATIVE    Bilirubin Urine NEGATIVE  NEGATIVE    Ketones, ur NEGATIVE  NEGATIVE (mg/dL)   Protein, ur NEGATIVE  NEGATIVE (mg/dL)   Urobilinogen, UA 0.2  0.0 - 1.0 (mg/dL)   Nitrite NEGATIVE  NEGATIVE    Leukocytes, UA NEGATIVE  NEGATIVE   POCT PREGNANCY, URINE      Status: Normal   Collection Time   01/06/12  8:08 AM      Component Value Range   Preg Test, Ur NEGATIVE  NEGATIVE   WET PREP, GENITAL     Status: Abnormal   Collection Time   01/06/12  8:25 AM      Component Value Range   Yeast Wet Prep HPF POC NONE SEEN  NONE SEEN    Trich, Wet Prep NONE SEEN  NONE SEEN    Clue Cells Wet Prep HPF POC MODERATE (*) NONE SEEN    WBC, Wet Prep HPF POC FEW (*) NONE SEEN      Assessment and Plan  BV - rx Flagyl Ankle swelling/pain - try either ACE wrap or support hose when on feet, f/u with PCP or urgent care if no improvement Rev'd normal menstrual cycles and when to be concerned Encouraged follow up with PCP - resources for finding health care provided  [facility] 01/06/2012, 8:14 AM

## 2012-01-06 NOTE — MAU Note (Signed)
Patient presents with c/o abdominal pain mid and left leg swelling which has been going on for some time, patient states she has two periods LMP end of March

## 2012-01-06 NOTE — MAU Provider Note (Signed)
Medical Screening exam and patient care preformed by advanced practice provider.  Agree with the above management.  

## 2012-01-07 LAB — GC/CHLAMYDIA PROBE AMP, GENITAL
Chlamydia, DNA Probe: NEGATIVE
GC Probe Amp, Genital: NEGATIVE

## 2012-01-09 ENCOUNTER — Encounter (HOSPITAL_COMMUNITY): Payer: Self-pay | Admitting: *Deleted

## 2012-01-09 ENCOUNTER — Emergency Department (HOSPITAL_COMMUNITY)
Admission: EM | Admit: 2012-01-09 | Discharge: 2012-01-09 | Disposition: A | Discharge status: Home / Self Care | Payer: Self-pay | Attending: Emergency Medicine | Admitting: Emergency Medicine

## 2012-01-09 DIAGNOSIS — M7989 Other specified soft tissue disorders: Secondary | ICD-10-CM | POA: Insufficient documentation

## 2012-01-09 HISTORY — DX: Anemia, unspecified: D64.9

## 2012-01-09 MED ORDER — ALPRAZOLAM 0.5 MG PO TABS
0.5000 mg | ORAL_TABLET | Freq: Once | ORAL | Status: AC
  Administered 2012-01-09: 0.5 mg via ORAL
  Filled 2012-01-09: qty 1

## 2012-01-09 NOTE — Progress Notes (Signed)
VASCULAR LAB PRELIMINARY  PRELIMINARY  PRELIMINARY  PRELIMINARY  Left lower extremity venous duplex completed.    Preliminary report:  Left:  No evidence of DVT, superficial thrombosis, or Baker's cyst.  Terance Hart, RVT 01/09/2012, 2:02 PM

## 2012-01-09 NOTE — ED Notes (Signed)
Pt to desk crying and yelling that she needs to talk to the charge nurse and the head of ultra sound.  Pt informed that her ultrasound was ordered and the staff will call the dept to inquire about a time of procedure. Pt continues speaking loudly and walks back to room.

## 2012-01-09 NOTE — ED Notes (Signed)
Charge nurse called and will send rep to speak to pt. Spoke with ultrasound and they states that pt's test will be next and they will send for her in about 20 minutes. Pt informed. Continues to be anxious and complaining about being in dept for over an hour.

## 2012-01-09 NOTE — ED Notes (Signed)
Assisted pt in understanding the process of care. Verbalized understanding. Reassured pt that her care is underway and staff will be here for her needs.

## 2012-01-09 NOTE — Discharge Instructions (Signed)
Elevate leg, consider using compression stockings. Take motrin or aleve as need. Follow up with primary care doctor in the coming week for recheck. Return to ER if worse, severe leg pain, redness, fevers, other concern.   You were given medication in the ER that can cause drowsiness - no driving for the next 6 hours.     Peripheral Edema You have swelling in your legs (peripheral edema). This swelling is due to excess accumulation of salt and water in your body. Edema may be a sign of heart, kidney or liver disease, or a side effect of a medication. It may also be due to problems in the leg veins. Elevating your legs and using special support stockings may be very helpful, if the cause of the swelling is due to poor venous circulation. Avoid long periods of standing, whatever the cause. Treatment of edema depends on identifying the cause. Chips, pretzels, pickles and other salty foods should be avoided. Restricting salt in your diet is almost always needed. Water pills (diuretics) are often used to remove the excess salt and water from your body via urine. These medicines prevent the kidney from reabsorbing sodium. This increases urine flow. Diuretic treatment may also result in lowering of potassium levels in your body. Potassium supplements may be needed if you have to use diuretics daily. Daily weights can help you keep track of your progress in clearing your edema. You should call your caregiver for follow up care as recommended. SEEK IMMEDIATE MEDICAL CARE IF:   You have increased swelling, pain, redness, or heat in your legs.   You develop shortness of breath, especially when lying down.   You develop chest or abdominal pain, weakness, or fainting.   You have a fever.  Document Released: 10/18/2004 Document Revised: 08/30/2011 Document Reviewed: 09/28/2009 [facility] Patient Information 2012 [place], [place].       RESOURCE GUIDE  Dental Problems  Patients with  Medicaid: [facility] [phone] [address]                                           [phone] [address] Phone:  [phone]                                                  Phone:  [phone]  If unable to pay or uninsured, contact:  [facility] or [facility] to become qualified for the adult dental clinic.  Chronic Pain Problems Contact [facility]  [phone] Patients need to be referred by their primary care doctor.  Insufficient Money for Medicine Contact United Way:  call "211" or [facility] [phone].  No Primary Care Doctor Call [facility]  [phone] Other agencies that provide inexpensive medical care    [facility]  [phone]    [facility]  [phone]    [facility]  [phone]    [facility]  [phone]    [facility]  [phone]    [facility]  [phone]  Psychological Services [facility]  [phone] [facility]  [phone]  [facility]   [phone] (emergency services [phone])  Substance Abuse Resources [facility]  2234714759 [facility] [phone] [facility] [phone] Floydene Flock [phone] Residential & Outpatient Substance Abuse Program  [phone]  Abuse/Neglect [facility] Child Abuse Hotline 978-645-9069 [facility] Child Abuse Hotline 306-704-1144 (After Hours)  Emergency Shelter [facility] 306-327-1918  Maternity Homes [facility] [place] [facility] [phone] [facility] ([phone]  MRSA Hotline #:   (831)542-6751    [facility] Resources  [facility]     [employer]                          [facility] Dept. [address] [place]                       [address]      [address]  Blondell Reveal Phone:  [phone]                                   Phone:  [phone]                 Phone:  [phone]  [facility] Phone:  [phone]  [facility] Child Abuse Hotline (717)150-7244 872-054-2216 (After Hours)

## 2012-01-09 NOTE — ED Notes (Signed)
Pt returned from vascular. Very calm and appreciative of staff. Call bell within reach

## 2012-01-09 NOTE — ED Provider Notes (Signed)
History     CSN: [id number]  Arrival date & time 01/09/12  1058   First MD Initiated Contact with Patient 01/09/12 1133      Chief Complaint  Patient presents with  . Leg Swelling    left knee down to foot    (Consider location/radiation/quality/duration/timing/severity/associated sxs/prior treatment) The history is provided by the patient.  pt c/o left leg swelling for past couple weeks. Denies injury. No hx dvt or pe. No cp or sob. No immobility or trauma. Denies leg numbness/weakness. No skin changes, erythema or rash. Has been ambulatory. No hip or radicular pain. No sob. Stats constant, denies specific exacerbating or alleviating factors.     Past Medical History  Diagnosis Date  . H/O cesarean section 2010  . Vaginal delivery 2004 , 2008, 2009  . Diabetes mellitus     gestational  . Anemia     Past Surgical History  Procedure Date  . Tubal ligation 2010  . Cesarean section     Family History  Problem Relation Age of Onset  . Hypertension Other   . Hypertension Mother     History  Substance Use Topics  . Smoking status: Current Everyday Smoker -- 0.5 packs/day  . Smokeless tobacco: Not on file  . Alcohol Use: Yes     1 beer a day    OB History    Grav Para Term Preterm Abortions TAB SAB Ect Mult Living   4 4 3 1      5       Review of Systems  Constitutional: Negative for fever and chills.  HENT: Negative for neck pain.   Eyes: Negative for redness.  Respiratory: Negative for shortness of breath.   Cardiovascular: Negative for chest pain.  Gastrointestinal: Negative for abdominal pain.  Genitourinary: Negative for flank pain.  Musculoskeletal: Negative for back pain.  Skin: Negative for rash.  Neurological: Negative for headaches.  Hematological: Does not bruise/bleed easily.  Psychiatric/Behavioral: Negative for confusion.    Allergies  Review of patient's allergies indicates no known allergies.  Home Medications   Current Outpatient Rx   Name Route Sig Dispense Refill  . ACETAMINOPHEN 500 MG PO TABS Oral Take 500 mg by mouth every 6 (six) hours as needed. For headaches.    Marland Kitchen METRONIDAZOLE 500 MG PO TABS Oral Take 1 tablet (500 mg total) by mouth 2 (two) times daily. 14 tablet 0  . PERMETHRIN 5 % EX CREA Topical Apply 1 application topically once.      BP 119/71  Pulse 82  Temp(Src) 98.4 F (36.9 C) (Oral)  Resp 14  SpO2 100%  LMP 12/16/2011  Physical Exam  Nursing note and vitals reviewed. Constitutional: She is oriented to person, place, and time. She appears well-developed and well-nourished. No distress.  Eyes: Conjunctivae are normal. No scleral icterus.  Neck: Neck supple. No tracheal deviation present.  Cardiovascular: Normal rate, regular rhythm, normal heart sounds and intact distal pulses.   Pulmonary/Chest: Effort normal and breath sounds normal. No respiratory distress.  Abdominal: Soft. Normal appearance and bowel sounds are normal. She exhibits no distension and no mass. There is no tenderness. There is no rebound and no guarding.  Musculoskeletal: Normal range of motion. She exhibits no tenderness.       Mild swelling left lower leg. Distal pulses palp. Good rom at hip, knee, and ankle without pain. No knee effusion. No skin changes or erythema. No rash.  No focal bony tenderness noted.   Neurological: She is  alert and oriented to person, place, and time.       Steady gait.   Skin: Skin is warm and dry. No rash noted.  Psychiatric: She has a normal mood and affect.    ED Course  Procedures (including critical care time)   Author:  Terance Hart  Service:  (none)  Author Type:  Sonographer   Filed:  01/09/12 1403  Note Time:  01/09/12 1402          VASCULAR LAB  PRELIMINARY PRELIMINARY PRELIMINARY PRELIMINARY  Left lower extremity venous duplex completed.  Preliminary report: Left: No evidence of DVT, superficial thrombosis, or Baker's cyst.  Terance Hart, RVT    01/09/2012, 2:02 PM       MDM  Vascular dopplers.   Pt w anxiety attack in ed. Pt states has hx anxiety/panic and that the wait was causing her anxiety. Pt reassured by multiple ed staff, remains anxious. Xanax po.   Discussed results w pt. rec close pcp f/u.       Suzi Roots, MD 01/09/12 1426

## 2012-01-09 NOTE — ED Notes (Signed)
Pt is here with left knee swelling that has travelled distal to foot.  Pulses present.

## 2012-02-10 ENCOUNTER — Emergency Department (HOSPITAL_COMMUNITY)
Admission: EM | Admit: 2012-02-10 | Discharge: 2012-02-10 | Disposition: A | Discharge status: Home / Self Care | Payer: Self-pay | Attending: Emergency Medicine | Admitting: Emergency Medicine

## 2012-02-10 ENCOUNTER — Encounter (HOSPITAL_COMMUNITY): Payer: Self-pay | Admitting: Physical Medicine and Rehabilitation

## 2012-02-10 DIAGNOSIS — K644 Residual hemorrhoidal skin tags: Secondary | ICD-10-CM | POA: Insufficient documentation

## 2012-02-10 DIAGNOSIS — M79609 Pain in unspecified limb: Secondary | ICD-10-CM | POA: Insufficient documentation

## 2012-02-10 DIAGNOSIS — F17200 Nicotine dependence, unspecified, uncomplicated: Secondary | ICD-10-CM | POA: Insufficient documentation

## 2012-02-10 DIAGNOSIS — M25579 Pain in unspecified ankle and joints of unspecified foot: Secondary | ICD-10-CM | POA: Insufficient documentation

## 2012-02-10 MED ORDER — PRAMOXINE HCL 1 % RE OINT
TOPICAL_OINTMENT | Freq: Three times a day (TID) | RECTAL | Status: DC | PRN
  Filled 2012-02-10: qty 30

## 2012-02-10 NOTE — Discharge Instructions (Signed)
Your evaluation today did not demonstrate bleeding or thrombosed hemorrhoids.  Nor was your physical exam consistent with dangerous changes in your ankle.  However, it is important that you return to the emergency department should you experience any concerning changes in your condition.  As discussed, primary care management of your conditions is important.  Please make sure to followup at the community clinic, as discussed

## 2012-02-10 NOTE — ED Notes (Signed)
Pt provided Tuck's cream at discharge, instructions provided.

## 2012-02-10 NOTE — ED Notes (Signed)
Pt presents to department for evaluation of hemorrhoids and chronic bilateral feet swelling. No relief with creams for hemorrhoids. Pt states chronic issues with feet swelling, was told to come back if it became worse. 8/10 pain at the time. She is alert and oriented x4. No signs of distress at the time.

## 2012-02-10 NOTE — ED Notes (Signed)
Pt c/o problem with hemorrhoids, reporting "I work on concrete floor, it is making my hemorrhoids come out". Unsure if rectal bleeding present due to being on cycle currently. Denying any constipation. Also c/o swelling to feet, trace edema noted to ankles. Pt ambulated independently

## 2012-02-10 NOTE — ED Provider Notes (Signed)
History  Scribed for Gerhard Munch, MD, the patient was seen in room STRE8/STRE8. This chart was scribed by Candelaria Stagers. The patient's care started at 12:17 PM    CSN: [id number]  Arrival date & time 02/10/12  1146   First MD Initiated Contact with Patient 02/10/12 1208      Chief Complaint  Patient presents with  . Hemorrhoids  . Foot Pain     Foot Pain This is a recurrent problem. The current episode started more than 1 week ago. The problem occurs constantly. The problem has been gradually worsening. Pertinent negatives include no chest pain, no abdominal pain, no headaches and no shortness of breath. The symptoms are aggravated by nothing. The symptoms are relieved by nothing. She has tried nothing for the symptoms. The treatment provided no relief.   Stephanie [name] is a 31 y.o. female who presents to the Emergency Department complaining of hemorrhoids that have gotten worse over the last few days.  Pt is also experiencing pain and swelling of the left ankle which is a previous sx that is not related to the presenting chief complaint.  She was here last month with pain in the left ankle and was dx with peripheral edema.  Pt has used preparation H cream and used suppository for the hemorrhoids with no relief. She denies fever, nausea, or vomiting.  She has h/o migraines.    Past Medical History  Diagnosis Date  . H/O cesarean section 2010  . Vaginal delivery 2004 , 2008, 2009  . Diabetes mellitus     gestational  . Anemia     Past Surgical History  Procedure Date  . Tubal ligation 2010  . Cesarean section     Family History  Problem Relation Age of Onset  . Hypertension Other   . Hypertension Mother     History  Substance Use Topics  . Smoking status: Current Everyday Smoker -- 0.5 packs/day  . Smokeless tobacco: Not on file  . Alcohol Use: Yes     1 beer a day    OB History    Grav Para Term Preterm Abortions TAB SAB Ect Mult Living   4 4 3 1      5       Review of Systems  Respiratory: Negative for shortness of breath.   Cardiovascular: Negative for chest pain.  Gastrointestinal: Negative for abdominal pain.  Genitourinary:       Hemorrhoids   Musculoskeletal: Positive for arthralgias (left ankle pain).  Neurological: Negative for headaches.    Allergies  Review of patient's allergies indicates no known allergies.  Home Medications   Current Outpatient Rx  Name Route Sig Dispense Refill  . ACETAMINOPHEN 500 MG PO TABS Oral Take 500 mg by mouth every 6 (six) hours as needed. For headaches.    . EXCEDRIN PO Oral Take 1 tablet by mouth 2 (two) times daily as needed. For headaches    . PERMETHRIN 5 % EX CREA Topical Apply 1 application topically once.      BP 125/81  Pulse 78  Temp(Src) 98.1 F (36.7 C) (Oral)  Resp 20  SpO2 98%  Physical Exam  Nursing note and vitals reviewed. Constitutional: She is oriented to person, place, and time. She appears well-developed and well-nourished. No distress.  HENT:  Head: Normocephalic and atraumatic.  Eyes: EOM are normal.  Neck: Neck supple. No tracheal deviation present.  Cardiovascular: Normal rate.   Pulmonary/Chest: Effort normal. No respiratory distress.  Genitourinary:  No bleeding no thrombosis of hemorrhoid on exam.   Musculoskeletal: Normal range of motion.       Trace asymmetry of ankles.  Normal movement, strength, sensation in ankle bilaterally.   Neurological: She is alert and oriented to person, place, and time.  Skin: Skin is warm and dry.  Psychiatric: She has a normal mood and affect. Her behavior is normal.    ED Course  Procedures    COORDINATION OF CARE:  12:24 PM Discussed course of care and will give information on the [facility].   Labs Reviewed - No data to display No results found.   No diagnosis found.    MDM  I personally performed the services described in this documentation, which was scribed in my presence. The  recorded information has been reviewed and considered.  This generally well-appearing young female presents with concerns of ongoing left ankle pain and hemorrhoids.  Patient reports that you were not for her hemorrhoids she will in the emergency department today, and a notable recent changes in her chronic left ankle initiated.  On exam the patient is in no distress, she is notable external hemorrhoids, that are neither thrombosed or actively bleeding.  The patient was provided topical analgesia, and discharged following a discussion on the need for continued primary care management of her medical issues.     Gerhard Munch, MD 02/10/12 1454

## 2012-02-11 ENCOUNTER — Encounter (HOSPITAL_COMMUNITY): Payer: Self-pay | Admitting: *Deleted

## 2012-02-11 ENCOUNTER — Emergency Department (HOSPITAL_COMMUNITY)
Admission: EM | Admit: 2012-02-11 | Discharge: 2012-02-11 | Disposition: A | Discharge status: Home / Self Care | Payer: Self-pay | Attending: Emergency Medicine | Admitting: Emergency Medicine

## 2012-02-11 DIAGNOSIS — K6289 Other specified diseases of anus and rectum: Secondary | ICD-10-CM | POA: Insufficient documentation

## 2012-02-11 DIAGNOSIS — K644 Residual hemorrhoidal skin tags: Secondary | ICD-10-CM | POA: Insufficient documentation

## 2012-02-11 DIAGNOSIS — F17200 Nicotine dependence, unspecified, uncomplicated: Secondary | ICD-10-CM | POA: Insufficient documentation

## 2012-02-11 MED ORDER — DOCUSATE SODIUM 100 MG PO CAPS: 100.0000 mg | ORAL_CAPSULE | Freq: Two times a day (BID) | ORAL | Status: AC

## 2012-02-11 MED ORDER — HYDROCORTISONE ACETATE 25 MG RE SUPP: 25.0000 mg | Freq: Two times a day (BID) | RECTAL | Status: AC

## 2012-02-11 MED ORDER — LIDOCAINE HCL 2 % EX GEL: CUTANEOUS | Status: DC | PRN

## 2012-02-11 NOTE — ED Notes (Signed)
Patient here today for evaluation of boil vs hemorrhoids rectum.  States pain intermittent 7-10/10 cramping intermittent tearful.  Currently calm, resting comfortably on stretcher , and watching TV. States while in triage pain was severe and tearful upset people in triage were going in front of her.  Patient states upset due to anxiety and panic attacks. Feels better now.  Verbalized understanding of PA Plan of care.

## 2012-02-11 NOTE — Discharge Instructions (Signed)

## 2012-02-11 NOTE — ED Notes (Signed)
Pt was seen in ED yesterday and dx with hemorrhoids. States she has followed the treatment but is feeling worse and her friend noticed 'white stuff' coming from her 'butt'. Pt is very tearful. Reassured.

## 2012-02-11 NOTE — ED Notes (Signed)
PA at bedside.

## 2012-02-13 NOTE — ED Provider Notes (Signed)
History     CSN: [id number]  Arrival date & time 02/11/12  1042   First MD Initiated Contact with Patient 02/11/12 1139      Chief Complaint  Patient presents with  . Abscess    (Consider location/radiation/quality/duration/timing/severity/associated sxs/prior treatment) HPI Comments: Stephanie [name] presents for re evaluation of her hemorrhoid problem she was treated for yesterday here.  She has been using tucks wipes and warm soaks along with preparation H but continues to have pain and cramping at her rectum.  She denies a history of constipation, but has not had a bm since yesterday.  A friend saw "white drainage" from her rectum,  So is concerned about possible abscess.  She denies fevers and chills, nausea , vomiting and abdominal pain.  She does have a history of hemorrhoids in the past with her last pregnancy.  Her symptoms are constant and have been present for the past week and gradually worsening.  The history is provided by the patient.    Past Medical History  Diagnosis Date  . H/O cesarean section 2010  . Vaginal delivery 2004 , 2008, 2009  . Diabetes mellitus     gestational  . Anemia     Past Surgical History  Procedure Date  . Tubal ligation 2010  . Cesarean section     Family History  Problem Relation Age of Onset  . Hypertension Other   . Hypertension Mother     History  Substance Use Topics  . Smoking status: Current Everyday Smoker -- 0.5 packs/day    Types: Cigarettes  . Smokeless tobacco: Not on file  . Alcohol Use: Yes     1 beer a day    OB History    Grav Para Term Preterm Abortions TAB SAB Ect Mult Living   4 4 3 1      5       Review of Systems  Constitutional: Negative for fever.  HENT: Negative for congestion, sore throat and neck pain.   Eyes: Negative.   Respiratory: Negative for chest tightness and shortness of breath.   Cardiovascular: Negative for chest pain.  Gastrointestinal: Positive for rectal pain. Negative for  nausea, vomiting and abdominal pain.  Genitourinary: Negative.  Negative for dysuria and vaginal discharge.  Musculoskeletal: Negative for joint swelling and arthralgias.  Skin: Negative.  Negative for rash and wound.  Neurological: Negative for dizziness, weakness, light-headedness, numbness and headaches.  Hematological: Negative.   Psychiatric/Behavioral: Negative.     Allergies  Review of patient's allergies indicates no known allergies.  Home Medications   Current Outpatient Rx  Name Route Sig Dispense Refill  . ACETAMINOPHEN 500 MG PO TABS Oral Take 1,000 mg by mouth every 6 (six) hours as needed. For headaches.    Marland Kitchen DOCUSATE SODIUM 100 MG PO CAPS Oral Take 1 capsule (100 mg total) by mouth every 12 (twelve) hours. To prevent constipation 60 capsule 0  . HYDROCORTISONE ACETATE 25 MG RE SUPP Rectal Place 1 suppository (25 mg total) rectally 2 (two) times daily. After warm sitz bath 12 suppository 0  . LIDOCAINE HCL 2 % EX GEL Topical Apply topically as needed. 30 mL 0    BP 133/75  Pulse 94  Temp(Src) 98.4 F (36.9 C) (Oral)  Resp 20  SpO2 100%  Physical Exam  Nursing note and vitals reviewed. Constitutional: She appears well-developed and well-nourished.  HENT:  Head: Normocephalic and atraumatic.  Neck: Neck supple.  Cardiovascular: Normal rate.   Pulmonary/Chest: Effort normal.  Abdominal: Soft. Bowel sounds are normal. She exhibits no distension. There is no tenderness. There is no guarding.  Genitourinary: Rectal exam shows external hemorrhoid and tenderness.       White ointment noted at anus and perineum (Prep H?).  No thrombosis noted.  Musculoskeletal: Normal range of motion.  Neurological: She is alert.  Skin: Skin is warm and dry.  Psychiatric: She has a normal mood and affect.    ED Course  Procedures (including critical care time)  Labs Reviewed - No data to display No results found.   1. Hemorrhoids       MDM  Patient prescribed lidocaine  2% - advised she can mix this in with her tucks pads for additional pain relief.  Given prescription for anusol suppositories to be inserted after sitz bath,  Also prescribed colace to avoid constipation.  Referral to general surgery if sx not resolving over the next week.        Burgess Amor, [place] 02/13/12 [phone]

## 2012-02-14 NOTE — ED Provider Notes (Signed)
Medical screening examination/treatment/procedure(s) were performed by non-physician practitioner and as supervising physician I was immediately available for consultation/collaboration.   Dione Booze, MD 02/14/12 1336

## 2012-03-11 ENCOUNTER — Ambulatory Visit (INDEPENDENT_AMBULATORY_CARE_PROVIDER_SITE_OTHER): Payer: Self-pay | Admitting: General Surgery

## 2012-04-15 ENCOUNTER — Ambulatory Visit (INDEPENDENT_AMBULATORY_CARE_PROVIDER_SITE_OTHER): Payer: Self-pay | Admitting: General Surgery

## 2012-04-28 ENCOUNTER — Emergency Department (HOSPITAL_COMMUNITY)
Admission: EM | Admit: 2012-04-28 | Discharge: 2012-04-28 | Discharge status: Against Medical Advice | Payer: Self-pay | Attending: Emergency Medicine | Admitting: Emergency Medicine

## 2012-04-28 ENCOUNTER — Encounter (HOSPITAL_COMMUNITY): Payer: Self-pay | Admitting: *Deleted

## 2012-04-28 DIAGNOSIS — R197 Diarrhea, unspecified: Secondary | ICD-10-CM | POA: Insufficient documentation

## 2012-04-28 DIAGNOSIS — R1110 Vomiting, unspecified: Secondary | ICD-10-CM | POA: Insufficient documentation

## 2012-04-28 LAB — CBC WITH DIFFERENTIAL/PLATELET
Basophils Absolute: 0 10*3/uL (ref 0.0–0.1)
HCT: 37.9 % (ref 36.0–46.0)
Hemoglobin: 12.5 g/dL (ref 12.0–15.0)
Lymphocytes Relative: 15 % (ref 12–46)
Monocytes Absolute: 0.6 10*3/uL (ref 0.1–1.0)
Monocytes Relative: 5 % (ref 3–12)
Neutro Abs: 9.2 10*3/uL — ABNORMAL HIGH (ref 1.7–7.7)

## 2012-04-28 LAB — POCT I-STAT, CHEM 8
BUN: 8 mg/dL (ref 6–23)
Calcium, Ion: 1.21 mmol/L (ref 1.12–1.23)
Chloride: 105 mEq/L (ref 96–112)
Glucose, Bld: 86 mg/dL (ref 70–99)

## 2012-04-28 NOTE — ED Notes (Signed)
Pt stated she was going to call [facility] and she was leaving. Mother remained in chair and attempted to calm pt down, RN attempted to calm pt. t refused to speak with RN and walked out of ER.

## 2012-04-28 NOTE — ED Notes (Signed)
PT is here with vomiting and some diarrhea that started today and reports her hemorrhoids slipped out a couple of days ago.  LMP==July 7th

## 2012-04-28 NOTE — ED Notes (Signed)
Offered pt something for nausea, pt refused. She is crying stating, "I have xanax and anxiety and I need something for a panic attack, I am sweating out my hands and no one cares. If I leave and come in EMS you will put me in the back" Pt is speaking in a loud voice.

## 2012-04-29 ENCOUNTER — Emergency Department (HOSPITAL_COMMUNITY)
Admission: EM | Admit: 2012-04-29 | Discharge: 2012-04-29 | Disposition: A | Discharge status: Home / Self Care | Payer: Self-pay | Attending: Emergency Medicine | Admitting: Emergency Medicine

## 2012-04-29 ENCOUNTER — Encounter (HOSPITAL_COMMUNITY): Payer: Self-pay | Admitting: *Deleted

## 2012-04-29 DIAGNOSIS — F209 Schizophrenia, unspecified: Secondary | ICD-10-CM | POA: Insufficient documentation

## 2012-04-29 DIAGNOSIS — D649 Anemia, unspecified: Secondary | ICD-10-CM | POA: Insufficient documentation

## 2012-04-29 DIAGNOSIS — Z711 Person with feared health complaint in whom no diagnosis is made: Secondary | ICD-10-CM

## 2012-04-29 DIAGNOSIS — K644 Residual hemorrhoidal skin tags: Secondary | ICD-10-CM | POA: Insufficient documentation

## 2012-04-29 DIAGNOSIS — F411 Generalized anxiety disorder: Secondary | ICD-10-CM | POA: Insufficient documentation

## 2012-04-29 DIAGNOSIS — F17200 Nicotine dependence, unspecified, uncomplicated: Secondary | ICD-10-CM | POA: Insufficient documentation

## 2012-04-29 HISTORY — DX: Panic disorder (episodic paroxysmal anxiety): F41.0

## 2012-04-29 HISTORY — DX: Depression, unspecified: F32.A

## 2012-04-29 HISTORY — DX: Schizophrenia, unspecified: F20.9

## 2012-04-29 HISTORY — DX: Major depressive disorder, single episode, unspecified: F32.9

## 2012-04-29 NOTE — ED Notes (Signed)
Pt states "the hemorrhoid slipped out 2-3 days ago but the n/v started yesterday, went to [facility] but left cause I got tired of waiting"

## 2012-04-29 NOTE — ED Provider Notes (Signed)
History     CSN: [id number]  Arrival date & time 04/29/12  4098   First MD Initiated Contact with Patient 04/29/12 1001      Chief Complaint  Patient presents with  . Hemorrhoids    (Consider location/radiation/quality/duration/timing/severity/associated sxs/prior treatment) HPI  31 year old female with history of schizophrenia, depression, and panic attack presents for an evaluation of her hemorrhoid.  Pt reports for the past month she has been noticing having hemorrhoid.  Denies any significant pain but does endorse discomfort which worries her. Denies rectal pain, bloody BM or melena.   She has been using suppository, preparation H, and lidocaine Jelly without adequate relief.   Pt also c/o having anxiety, which makes her palm sweaty and she would feel nauseate and vomit.  She denies SI/HI.  She does not know what causes her to stress out but she has been dealing with stress "all my life".  Pt currently denies fever, chills, cp, sob, abd pain, back pain, urinary sxs.  Her menstruation started today.      Past Medical History  Diagnosis Date  . H/O cesarean section 2010  . Vaginal delivery 2004 , 2008, 2009  . Diabetes mellitus     gestational  . Anemia   . Panic attacks   . Depression   . Schizophrenia     Past Surgical History  Procedure Date  . Tubal ligation 2010  . Cesarean section     Family History  Problem Relation Age of Onset  . Hypertension Other   . Hypertension Mother     History  Substance Use Topics  . Smoking status: Current Everyday Smoker -- 0.5 packs/day    Types: Cigarettes  . Smokeless tobacco: Not on file  . Alcohol Use: Yes     ocassionally    OB History    Grav Para Term Preterm Abortions TAB SAB Ect Mult Living   4 4 3 1      5       Review of Systems  All other systems reviewed and are negative.    Allergies  Review of patient's allergies indicates no known allergies.  Home Medications   Current Outpatient Rx  Name  Route Sig Dispense Refill  . ACETAMINOPHEN 500 MG PO TABS Oral Take 1,000 mg by mouth every 6 (six) hours as needed. For headaches.    Marland Kitchen PHENYLEPH-SHARK LIV OIL-MO-PET 0.25-3-14-71.9 % RE OINT Rectal Place 1 application rectally 2 (two) times daily as needed. pain    . PRAMOXINE HCL 1 % RE OINT Rectal Place 1 application rectally every 2 (two) hours as needed. pain    . LIDOCAINE HCL 2 % EX GEL Topical Apply topically as needed. 30 mL 0    BP 118/71  Pulse 96  Temp 98.9 F (37.2 C) (Oral)  Resp 20  Wt 163 lb 3.2 oz (74.027 kg)  SpO2 98%  LMP 03/30/2012  Physical Exam  Nursing note and vitals reviewed. Constitutional: She is oriented to person, place, and time. She appears well-developed and well-nourished. No distress.  HENT:  Head: Atraumatic.  Mouth/Throat: Oropharynx is clear and moist.  Eyes: Conjunctivae are normal.  Neck: Neck supple. No thyromegaly present.  Genitourinary: Rectal exam shows external hemorrhoid. Rectal exam shows no internal hemorrhoid, no fissure, no mass, no tenderness and anal tone normal.       Chaperone present  Musculoskeletal: She exhibits no edema.  Neurological: She is alert and oriented to person, place, and time.  Skin: Skin is warm. No  rash noted.  Psychiatric: She has a normal mood and affect.    ED Course  Procedures (including critical care time)  Labs Reviewed - No data to display No results found.   No diagnosis found.  1. External hemorrhoid 2. anxiety  MDM  Pt has mild evidence of external hemorrhoid, no evidence of rectal prolapse or hemorrhoid thrombosis.  She has Tucks, Xylocaine Jelly, and Preparation H.  I reassure that hemorrhoids are normal, especially when they are not hurting.  I also recommend avoid excessive straining as it can make it worse.   Pt also concern of sweaty palms and anxiety.  She has hx of panic attack and schizophrenia.  This is not emergent.  Will give referral to PCP for further care.  Pt voice  understanding and agrees with plan.          Fayrene Helper, PA-C 04/29/12 1022

## 2012-04-30 NOTE — ED Provider Notes (Signed)
Medical screening examination/treatment/procedure(s) were performed by non-physician practitioner and as supervising physician I was immediately available for consultation/collaboration.   Loren Racer, MD 04/30/12 2231

## 2012-11-04 ENCOUNTER — Emergency Department (HOSPITAL_COMMUNITY)
Admission: EM | Admit: 2012-11-04 | Discharge: 2012-11-04 | Disposition: A | Discharge status: Home / Self Care | Payer: Medicaid Other | Attending: Emergency Medicine | Admitting: Emergency Medicine

## 2012-11-04 ENCOUNTER — Encounter (HOSPITAL_COMMUNITY): Payer: Self-pay | Admitting: Emergency Medicine

## 2012-11-04 DIAGNOSIS — Z8632 Personal history of gestational diabetes: Secondary | ICD-10-CM | POA: Insufficient documentation

## 2012-11-04 DIAGNOSIS — K648 Other hemorrhoids: Secondary | ICD-10-CM

## 2012-11-04 DIAGNOSIS — F1210 Cannabis abuse, uncomplicated: Secondary | ICD-10-CM | POA: Insufficient documentation

## 2012-11-04 DIAGNOSIS — K5900 Constipation, unspecified: Secondary | ICD-10-CM | POA: Insufficient documentation

## 2012-11-04 DIAGNOSIS — R197 Diarrhea, unspecified: Secondary | ICD-10-CM | POA: Insufficient documentation

## 2012-11-04 DIAGNOSIS — F17200 Nicotine dependence, unspecified, uncomplicated: Secondary | ICD-10-CM | POA: Insufficient documentation

## 2012-11-04 DIAGNOSIS — Z862 Personal history of diseases of the blood and blood-forming organs and certain disorders involving the immune mechanism: Secondary | ICD-10-CM | POA: Insufficient documentation

## 2012-11-04 DIAGNOSIS — Z8659 Personal history of other mental and behavioral disorders: Secondary | ICD-10-CM | POA: Insufficient documentation

## 2012-11-04 DIAGNOSIS — Z79899 Other long term (current) drug therapy: Secondary | ICD-10-CM | POA: Insufficient documentation

## 2012-11-04 DIAGNOSIS — R300 Dysuria: Secondary | ICD-10-CM | POA: Insufficient documentation

## 2012-11-04 DIAGNOSIS — K6289 Other specified diseases of anus and rectum: Secondary | ICD-10-CM | POA: Insufficient documentation

## 2012-11-04 MED ORDER — HYDROCORTISONE ACETATE 25 MG RE SUPP: 25.0000 mg | Freq: Two times a day (BID) | RECTAL | Status: DC

## 2012-11-04 MED ORDER — DOCUSATE SODIUM 100 MG PO CAPS: 100.0000 mg | ORAL_CAPSULE | Freq: Two times a day (BID) | ORAL | Status: DC | PRN

## 2012-11-04 MED ORDER — HYDROCORTISONE ACETATE 25 MG RE SUPP
25.0000 mg | Freq: Once | RECTAL | Status: AC
  Administered 2012-11-04: 25 mg via RECTAL
  Filled 2012-11-04: qty 1

## 2012-11-04 NOTE — ED Notes (Signed)
Patient c/o hemorrhoids causing severe pain. Patient states she has used Preparation H Cooling Gels and Suppository, a saline laxative, and tucks pads, also Aleve. Patient reports runny stool about 2-3 hours ago. Patient diaphoretic. Patient reports using Marijuana this week, denies any other illegal drugs. Patient reports a long history of hemorrhoids.

## 2012-11-04 NOTE — ED Notes (Signed)
Pt c/o hemorrhoid pain,N/V, unable to sleep. Pt has used many over counter medications such as tucks pads. perpetration H, cooling gels, with no relief.

## 2012-11-04 NOTE — ED Provider Notes (Signed)
Medical screening examination/treatment/procedure(s) were performed by non-physician practitioner and as supervising physician I was immediately available for consultation/collaboration.  Jaycob Mcclenton, MD 11/04/12 0628 

## 2012-11-04 NOTE — ED Provider Notes (Signed)
History     CSN: [id number]  Arrival date & time 11/04/12  4098   First MD Initiated Contact with Patient 11/04/12 [phone]      Chief Complaint  Patient presents with  . Hemorrhoids    (Consider location/radiation/quality/duration/timing/severity/associated sxs/prior treatment) HPI Comments: Patient states, that she has had hemorrhoids since the birth of her daughter 4 years ago, that intermittently flared up.  Presents tonight with 2 days of rectal pain.  She is having bowel movements.  They are painful.  She has tried over-the-counter Preparation H Tucks pads without relief.  Denies any fever.  Recent episodes of diarrhea, or constipation, dysuria  The history is provided by the patient.    Past Medical History  Diagnosis Date  . H/O cesarean section 2010  . Vaginal delivery 2004 , 2008, 2009  . Diabetes mellitus     gestational  . Anemia   . Panic attacks   . Depression   . Schizophrenia     Past Surgical History  Procedure Laterality Date  . Tubal ligation  2010  . Cesarean section      Family History  Problem Relation Age of Onset  . Hypertension Other   . Hypertension Mother     History  Substance Use Topics  . Smoking status: Current Every Day Smoker -- 0.50 packs/day    Types: Cigarettes  . Smokeless tobacco: Not on file  . Alcohol Use: Yes     Comment: ocassionally    OB History   Grav Para Term Preterm Abortions TAB SAB Ect Mult Living   4 4 3 1      5       Review of Systems  Constitutional: Negative for fever and chills.  Gastrointestinal: Positive for rectal pain. Negative for nausea, vomiting, abdominal pain, diarrhea, constipation, blood in stool and anal bleeding.  Genitourinary: Negative for dysuria.  Musculoskeletal: Negative for back pain.  All other systems reviewed and are negative.    Allergies  Review of patient's allergies indicates no known allergies.  Home Medications   Current Outpatient Rx  Name  Route  Sig  Dispense   Refill  . acetaminophen (TYLENOL) 500 MG tablet   Oral   Take 1,000 mg by mouth every 6 (six) hours as needed. For headaches.         . phenylephrine-shark liver oil-mineral oil-petrolatum (PREPARATION H) 0.25-3-14-71.9 % rectal ointment   Rectal   Place 1 application rectally 2 (two) times daily as needed. pain         . pramoxine-mineral oil-zinc (TUCK'S) 1 % rectal ointment   Rectal   Place 1 application rectally every 2 (two) hours as needed. pain         . docusate sodium (COLACE) 100 MG capsule   Oral   Take 1 capsule (100 mg total) by mouth 2 (two) times daily as needed for constipation.   60 capsule   0     1 tablet twice a day onto stools are soft and easi ...   . hydrocortisone (ANUSOL-HC) 25 MG suppository   Rectal   Place 1 suppository (25 mg total) rectally 2 (two) times daily.   12 suppository   0     BP 132/84  Pulse 91  Temp(Src) 98.8 F (37.1 C) (Oral)  Resp 20  SpO2 100%  LMP 10/30/2012  Physical Exam  Constitutional: She appears well-developed.  HENT:  Head: Normocephalic.  Eyes: Pupils are equal, round, and reactive to light.  Neck: Normal  range of motion.  Cardiovascular: Normal rate.   Pulmonary/Chest: Effort normal.  Abdominal: Soft. Bowel sounds are normal. She exhibits no distension. There is no tenderness.  Genitourinary: Rectal exam shows internal hemorrhoid and tenderness.  Patient has internal hemorrhoid at the 6:00 location.  Approximately 2 cm long, fluctuant without evidence of thrombosis    ED Course  Procedures (including critical care time)  Labs Reviewed - No data to display No results found.   1. Hemorrhoids, internal       MDM  Fluctuant, internal hemorrhoid, without evidence of, thrombosis.  We'll treat with Anusol HC suppositories, and stool softener        Arman Filter, NP 11/04/12 0402  Arman Filter, NP 11/04/12 6578

## 2013-04-02 ENCOUNTER — Emergency Department (HOSPITAL_COMMUNITY)
Admission: EM | Admit: 2013-04-02 | Discharge: 2013-04-03 | Disposition: A | Discharge status: Home / Self Care | Payer: Medicaid Other | Attending: Emergency Medicine | Admitting: Emergency Medicine

## 2013-04-02 ENCOUNTER — Encounter (HOSPITAL_COMMUNITY): Payer: Self-pay | Admitting: *Deleted

## 2013-04-02 DIAGNOSIS — F329 Major depressive disorder, single episode, unspecified: Secondary | ICD-10-CM | POA: Insufficient documentation

## 2013-04-02 DIAGNOSIS — Z862 Personal history of diseases of the blood and blood-forming organs and certain disorders involving the immune mechanism: Secondary | ICD-10-CM | POA: Insufficient documentation

## 2013-04-02 DIAGNOSIS — K649 Unspecified hemorrhoids: Secondary | ICD-10-CM

## 2013-04-02 DIAGNOSIS — F209 Schizophrenia, unspecified: Secondary | ICD-10-CM | POA: Insufficient documentation

## 2013-04-02 DIAGNOSIS — F3289 Other specified depressive episodes: Secondary | ICD-10-CM | POA: Insufficient documentation

## 2013-04-02 DIAGNOSIS — Z79899 Other long term (current) drug therapy: Secondary | ICD-10-CM | POA: Insufficient documentation

## 2013-04-02 DIAGNOSIS — Z8659 Personal history of other mental and behavioral disorders: Secondary | ICD-10-CM | POA: Insufficient documentation

## 2013-04-02 DIAGNOSIS — E119 Type 2 diabetes mellitus without complications: Secondary | ICD-10-CM | POA: Insufficient documentation

## 2013-04-02 DIAGNOSIS — K648 Other hemorrhoids: Secondary | ICD-10-CM | POA: Insufficient documentation

## 2013-04-02 DIAGNOSIS — F17200 Nicotine dependence, unspecified, uncomplicated: Secondary | ICD-10-CM | POA: Insufficient documentation

## 2013-04-02 HISTORY — DX: Unspecified hemorrhoids: K64.9

## 2013-04-02 MED ORDER — LIDOCAINE HCL 2 % EX GEL
CUTANEOUS | Status: AC
  Filled 2013-04-02: qty 10

## 2013-04-02 MED ORDER — KETOROLAC TROMETHAMINE 60 MG/2ML IM SOLN
60.0000 mg | Freq: Once | INTRAMUSCULAR | Status: AC
  Administered 2013-04-03: 60 mg via INTRAMUSCULAR
  Filled 2013-04-02: qty 2

## 2013-04-02 NOTE — ED Notes (Signed)
Hemorrhoid pain x 3 days; severe; no bleeding

## 2013-04-02 NOTE — ED Provider Notes (Signed)
History    CSN: [id number] Arrival date & time 04/02/13  2030  First MD Initiated Contact with Patient 04/02/13 2205     Chief Complaint  Patient presents with  . Rectal Pain   (Consider location/radiation/quality/duration/timing/severity/associated sxs/prior Treatment) HPI  Patient is a 32 year old female past medical history significant for hemorrhoids presenting for a three-day flareup of stabbing constant non-radiating rectal pain that she rates a 8/10. Patient states this rectal pain feels like previous hemorrhoid flare-ups. She states she has not been to see the general surgery yet. Denies alleviating or aggravating factors. Patient denies any fevers, chills, nausea, vomiting, bright red blood per rectum, black stools, diarrhea, constipation.  Past Medical History  Diagnosis Date  . H/O cesarean section 2010  . Vaginal delivery 2004 , 2008, 2009  . Diabetes mellitus     gestational  . Anemia   . Panic attacks   . Depression   . Schizophrenia   . Hemorrhoids    Past Surgical History  Procedure Laterality Date  . Tubal ligation  2010  . Cesarean section     Family History  Problem Relation Age of Onset  . Hypertension Other   . Hypertension Mother    History  Substance Use Topics  . Smoking status: Current Every Day Smoker -- 0.50 packs/day    Types: Cigarettes  . Smokeless tobacco: Not on file  . Alcohol Use: Yes     Comment: ocassionally   OB History   Grav Para Term Preterm Abortions TAB SAB Ect Mult Living   4 4 3 1      5      Review of Systems  Constitutional: Negative for fever and chills.  Respiratory: Negative for shortness of breath.   Cardiovascular: Negative for chest pain.  Gastrointestinal: Positive for rectal pain. Negative for nausea, vomiting, blood in stool and anal bleeding.  Musculoskeletal: Negative.   All other systems reviewed and are negative.    Allergies  Review of patient's allergies indicates no known allergies.  Home  Medications   Current Outpatient Rx  Name  Route  Sig  Dispense  Refill  . acetaminophen (TYLENOL) 500 MG tablet   Oral   Take 1,000 mg by mouth every 6 (six) hours as needed. For headaches.         . ARIPiprazole (ABILIFY) 5 MG tablet   Oral   Take 5 mg by mouth daily.         . mirtazapine (REMERON) 15 MG tablet   Oral   Take 15 mg by mouth at bedtime.         . phenylephrine-shark liver oil-mineral oil-petrolatum (PREPARATION H) 0.25-3-14-71.9 % rectal ointment   Rectal   Place 1 application rectally 2 (two) times daily as needed. pain         . pramoxine-mineral oil-zinc (TUCK'S) 1 % rectal ointment   Rectal   Place 1 application rectally every 2 (two) hours as needed. pain         . docusate sodium (COLACE) 100 MG capsule   Oral   Take 1 capsule (100 mg total) by mouth every 12 (twelve) hours.   30 capsule   0   . hydrocortisone (ANUSOL-HC) 2.5 % rectal cream      Apply rectally 2 times daily   30 g   0    BP 134/99  Pulse 98  Temp(Src) 97.9 F (36.6 C)  Resp 20  SpO2 96% Physical Exam  Constitutional: She is oriented to  person, place, and time. She appears well-developed and well-nourished. No distress.  HENT:  Head: Normocephalic and atraumatic.  Eyes: Conjunctivae are normal.  Neck: Neck supple.  Cardiovascular: Normal rate, regular rhythm and normal heart sounds.   Pulmonary/Chest: Effort normal and breath sounds normal.  Abdominal: Soft. Bowel sounds are normal. There is no tenderness.  Genitourinary: Rectal exam shows internal hemorrhoid and tenderness. Rectal exam shows no external hemorrhoid, no fissure, no mass and anal tone normal.  Fluctuant internal hemorrhoid No thrombosed hemorrhoids No rectal prolapse  Musculoskeletal: She exhibits no edema.  Neurological: She is alert and oriented to person, place, and time.  Skin: Skin is warm and dry. She is not diaphoretic.  Psychiatric: She has a normal mood and affect.    ED Course   Procedures (including critical care time) Labs Reviewed - No data to display No results found. 1. Hemorrhoid     MDM  Pt to ER with fluctuant internal hemorrhoids on rectal examination. No evidence of rectal prolapse. Treated w lidocaine ointment while in ED. Will d/c home w/ Anusol, stool softener and recommendation for surgery f-u. Pt w normal VS and in NAD prior to dc.    Jeannetta Ellis, PA-C 04/03/13 1610

## 2013-04-02 NOTE — ED Notes (Signed)
[id number]<WG> Expected date:<BR> Expected time:<BR> Means of arrival:<BR> Comments:<BR> EMS/32 yo with hemorrhoid pain/diaphoretic

## 2013-04-02 NOTE — ED Notes (Signed)
Pt states she has had rectal pain off and on for months due to hemorrhoids,  Pt is tearful,  States pain is 7/10,

## 2013-04-03 MED ORDER — DOCUSATE SODIUM 100 MG PO CAPS: 100.0000 mg | ORAL_CAPSULE | Freq: Two times a day (BID) | ORAL | Status: DC

## 2013-04-03 MED ORDER — HYDROCORTISONE 2.5 % RE CREA: TOPICAL_CREAM | RECTAL | Status: DC

## 2013-04-03 NOTE — ED Provider Notes (Signed)
Medical screening examination/treatment/procedure(s) were performed by non-physician practitioner and as supervising physician I was immediately available for consultation/collaboration.  Olivia Mackie, MD 04/03/13 [phone]

## 2013-04-04 ENCOUNTER — Emergency Department (HOSPITAL_COMMUNITY)
Admission: EM | Admit: 2013-04-04 | Discharge: 2013-04-04 | Disposition: A | Discharge status: Home / Self Care | Payer: Medicaid Other | Attending: Emergency Medicine | Admitting: Emergency Medicine

## 2013-04-04 ENCOUNTER — Encounter (HOSPITAL_COMMUNITY): Payer: Self-pay | Admitting: Emergency Medicine

## 2013-04-04 DIAGNOSIS — K644 Residual hemorrhoidal skin tags: Secondary | ICD-10-CM | POA: Insufficient documentation

## 2013-04-04 DIAGNOSIS — Z79899 Other long term (current) drug therapy: Secondary | ICD-10-CM | POA: Insufficient documentation

## 2013-04-04 DIAGNOSIS — F209 Schizophrenia, unspecified: Secondary | ICD-10-CM | POA: Insufficient documentation

## 2013-04-04 DIAGNOSIS — F329 Major depressive disorder, single episode, unspecified: Secondary | ICD-10-CM | POA: Insufficient documentation

## 2013-04-04 DIAGNOSIS — Z862 Personal history of diseases of the blood and blood-forming organs and certain disorders involving the immune mechanism: Secondary | ICD-10-CM | POA: Insufficient documentation

## 2013-04-04 DIAGNOSIS — F17200 Nicotine dependence, unspecified, uncomplicated: Secondary | ICD-10-CM | POA: Insufficient documentation

## 2013-04-04 DIAGNOSIS — F3289 Other specified depressive episodes: Secondary | ICD-10-CM | POA: Insufficient documentation

## 2013-04-04 DIAGNOSIS — IMO0002 Reserved for concepts with insufficient information to code with codable children: Secondary | ICD-10-CM | POA: Insufficient documentation

## 2013-04-04 DIAGNOSIS — F410 Panic disorder [episodic paroxysmal anxiety] without agoraphobia: Secondary | ICD-10-CM | POA: Insufficient documentation

## 2013-04-04 DIAGNOSIS — Z8632 Personal history of gestational diabetes: Secondary | ICD-10-CM | POA: Insufficient documentation

## 2013-04-04 DIAGNOSIS — K6289 Other specified diseases of anus and rectum: Secondary | ICD-10-CM | POA: Insufficient documentation

## 2013-04-04 MED ORDER — LIDOCAINE HCL 2 % EX GEL: CUTANEOUS | Status: DC | PRN

## 2013-04-04 MED ORDER — LIDOCAINE HCL 2 % EX GEL
Freq: Once | CUTANEOUS | Status: AC
  Administered 2013-04-04: 10 via TOPICAL
  Filled 2013-04-04: qty 10

## 2013-04-04 MED ORDER — HYDROMORPHONE HCL PF 1 MG/ML IJ SOLN
1.0000 mg | Freq: Once | INTRAMUSCULAR | Status: AC
  Administered 2013-04-04: 1 mg via INTRAMUSCULAR
  Filled 2013-04-04: qty 1

## 2013-04-04 NOTE — ED Provider Notes (Signed)
History    CSN: [id number] Arrival date & time 04/04/13  0113  First MD Initiated Contact with Patient 04/04/13 0243     Chief Complaint  Patient presents with  . Rectal Pain   (Consider location/radiation/quality/duration/timing/severity/associated sxs/prior Treatment) HPI History provided by patient. Has known hemorrhoids and recent evaluation here for the same, was given referral to [facility] surgery but has not yet scheduled appointment. She's been using Anusol and narcotic pain medications at home with persistent pain and discomfort. Last bowel movement yesterday, denies constipation. Is taking Colace. No blood in stools. No abdominal pain. Rectal pain is sharp and severe. No known alleviating factors.  Past Medical History  Diagnosis Date  . H/O cesarean section 2010  . Vaginal delivery 2004 , 2008, 2009  . Diabetes mellitus     gestational  . Anemia   . Panic attacks   . Depression   . Schizophrenia   . Hemorrhoids    Past Surgical History  Procedure Laterality Date  . Tubal ligation  2010  . Cesarean section     Family History  Problem Relation Age of Onset  . Hypertension Other   . Hypertension Mother    History  Substance Use Topics  . Smoking status: Current Every Day Smoker -- 0.50 packs/day    Types: Cigarettes  . Smokeless tobacco: Not on file  . Alcohol Use: Yes     Comment: ocassionally   OB History   Grav Para Term Preterm Abortions TAB SAB Ect Mult Living   4 4 3 1      5      Review of Systems  Constitutional: Negative for fever and chills.  HENT: Negative for neck pain and neck stiffness.   Eyes: Negative for pain.  Respiratory: Negative for shortness of breath.   Cardiovascular: Negative for chest pain.  Gastrointestinal: Positive for rectal pain. Negative for abdominal pain.  Genitourinary: Negative for dysuria.  Musculoskeletal: Negative for back pain.  Skin: Negative for rash.  Neurological: Negative for headaches.  All  other systems reviewed and are negative.    Allergies  Review of patient's allergies indicates no known allergies.  Home Medications   Current Outpatient Rx  Name  Route  Sig  Dispense  Refill  . acetaminophen (TYLENOL) 500 MG tablet   Oral   Take 1,000 mg by mouth every 6 (six) hours as needed (headache). For headaches.         . ARIPiprazole (ABILIFY) 5 MG tablet   Oral   Take 5 mg by mouth every morning.          Marland Kitchen aspirin-sod bicarb-citric acid (ALKA-SELTZER) 325 MG TBEF   Oral   Take 325 mg by mouth every 6 (six) hours as needed (pain).         . bisacodyl (DULCOLAX) 10 MG suppository   Rectal   Place 10 mg rectally as needed for constipation.         . docusate sodium (COLACE) 100 MG capsule   Oral   Take 1 capsule (100 mg total) by mouth every 12 (twelve) hours.   30 capsule   0   . hydrocortisone (ANUSOL-HC) 2.5 % rectal cream   Rectal   Place 1 application rectally 2 (two) times daily. Apply rectally 2 times daily         . ibuprofen (ADVIL,MOTRIN) 200 MG tablet   Oral   Take 800 mg by mouth every 8 (eight) hours as needed for pain.         Marland Kitchen  mirtazapine (REMERON) 15 MG tablet   Oral   Take 15 mg by mouth at bedtime.         . phenylephrine-shark liver oil-mineral oil-petrolatum (PREPARATION H) 0.25-3-14-71.9 % rectal ointment   Rectal   Place 1 application rectally 2 (two) times daily as needed for hemorrhoids. pain         . pramoxine-mineral oil-zinc (TUCK'S) 1 % rectal ointment   Rectal   Place 1 application rectally every 2 (two) hours as needed for hemorrhoids. pain          BP 124/74  Pulse 87  Temp(Src) 98.1 F (36.7 C) (Oral)  Resp 20  SpO2 100%  LMP 03/23/2013 Physical Exam  Constitutional: She is oriented to person, place, and time. She appears well-developed and well-nourished.  HENT:  Head: Normocephalic and atraumatic.  Eyes: EOM are normal. Pupils are equal, round, and reactive to light.  Neck: Neck supple.   Cardiovascular: Regular rhythm and intact distal pulses.   Pulmonary/Chest: Effort normal. No respiratory distress.  Genitourinary:  Rectal exam: Very small external hemorrhoid, not thrombosed. No fissure. Tender on digital exam without mass.   Musculoskeletal: Normal range of motion. She exhibits no edema.  Neurological: She is alert and oriented to person, place, and time.  Skin: Skin is warm and dry.    ED Course  Procedures (including critical care time)  IM Dilaudid provided. Topical lidocaine jelly.  4:05 AM on recheck is feeling much better. Patient agrees to followup with general surgery and primary care physician. Prescription for topical lidocaine provided. She will continue sitz baths. Precautions verbalized is understood.  MDM  Rectal pain previously diagnosed with hemorrhoids  Symptomatically improved with medications as above  Previous records, vital signs and nursing notes reviewed and considered  Sunnie Nielsen, MD 04/04/13 [phone]

## 2013-04-04 NOTE — ED Notes (Signed)
GEX:BM84<XL> Expected date:04/04/13<BR> Expected time:12:50 AM<BR> Means of arrival:Ambulance<BR> Comments:<BR> Rectal pain

## 2013-04-04 NOTE — ED Notes (Signed)
Per EMS: Pt was in ED yesterday with c/o of hemorrhoids pain. Pt instructed to call surgeon.

## 2013-04-04 NOTE — ED Notes (Signed)
Pharmacy contacted in regards to Pt taking home lidocaine jelly 2%. Pharmacist approved of pt taking home jelly.

## 2013-04-17 ENCOUNTER — Ambulatory Visit (INDEPENDENT_AMBULATORY_CARE_PROVIDER_SITE_OTHER): Payer: Medicaid Other | Admitting: General Surgery

## 2013-05-01 ENCOUNTER — Encounter (INDEPENDENT_AMBULATORY_CARE_PROVIDER_SITE_OTHER): Payer: Self-pay | Admitting: General Surgery

## 2013-05-01 ENCOUNTER — Ambulatory Visit (INDEPENDENT_AMBULATORY_CARE_PROVIDER_SITE_OTHER): Payer: Medicaid Other | Admitting: General Surgery

## 2013-05-01 VITALS — BP 134/82 | HR 76 | Temp 98.0°F | Resp 18 | Ht 68.0 in | Wt 179.0 lb

## 2013-05-01 DIAGNOSIS — K642 Third degree hemorrhoids: Secondary | ICD-10-CM | POA: Insufficient documentation

## 2013-05-01 DIAGNOSIS — K648 Other hemorrhoids: Secondary | ICD-10-CM

## 2013-05-01 NOTE — Progress Notes (Signed)
Patient ID: Stephanie [name], female   DOB: 1981/07/20, 32 y.o.   MRN: [id number]  Chief Complaint  Patient presents with  . New Evaluation    hems    HPI Stephanie [name] is a 32 y.o. female.   HPI 32 year old [place] American female referred by Dr. Norlene Campbell for evaluation of hemorrhoidal problems. The patient states that she has had intermittent problems for the past 3-4 years. She states that they will flareup at times. She states that she drinks about one glass of water a day. She does not take any supplemental fiber. She eats a low fiber diet. She sits on the commode for 20-25 minutes at a time. She denies any perianal itching or burning. She denies any anal or rectal bleeding. She states her stools are hard. She denies any pain with defecation. She denies any weight loss. She denies any family history of colorectal cancer. She states that her bowel movements occur on average every other day. She will use the ointments and suppositories when needed. Sometimes they work Past Medical History  Diagnosis Date  . H/O cesarean section 2010  . Vaginal delivery 2004 , 2008, 2009  . Diabetes mellitus     gestational  . Anemia   . Panic attacks   . Depression   . Schizophrenia   . Hemorrhoids     Past Surgical History  Procedure Laterality Date  . Tubal ligation  2010  . Cesarean section      Family History  Problem Relation Age of Onset  . Hypertension Other   . Hypertension Mother     Social History History  Substance Use Topics  . Smoking status: Current Every Day Smoker -- 0.50 packs/day    Types: Cigarettes  . Smokeless tobacco: Not on file  . Alcohol Use: Yes     Comment: ocassionally    No Known Allergies  Current Outpatient Prescriptions  Medication Sig Dispense Refill  . acetaminophen (TYLENOL) 500 MG tablet Take 1,000 mg by mouth every 6 (six) hours as needed (headache). For headaches.      . ARIPiprazole (ABILIFY) 5 MG tablet Take 5 mg by mouth every morning.        Marland Kitchen aspirin-sod bicarb-citric acid (ALKA-SELTZER) 325 MG TBEF Take 325 mg by mouth every 6 (six) hours as needed (pain).      . bisacodyl (DULCOLAX) 10 MG suppository Place 10 mg rectally as needed for constipation.      . docusate sodium (COLACE) 100 MG capsule Take 1 capsule (100 mg total) by mouth every 12 (twelve) hours.  30 capsule  0  . hydrocortisone (ANUSOL-HC) 2.5 % rectal cream Place 1 application rectally 2 (two) times daily. Apply rectally 2 times daily      . ibuprofen (ADVIL,MOTRIN) 200 MG tablet Take 800 mg by mouth every 8 (eight) hours as needed for pain.      Marland Kitchen lidocaine (XYLOCAINE JELLY) 2 % jelly Apply topically as needed.  30 mL  0  . mirtazapine (REMERON) 15 MG tablet Take 15 mg by mouth at bedtime.      . phenylephrine-shark liver oil-mineral oil-petrolatum (PREPARATION H) 0.25-3-14-71.9 % rectal ointment Place 1 application rectally 2 (two) times daily as needed for hemorrhoids. pain      . pramoxine-mineral oil-zinc (TUCK'S) 1 % rectal ointment Place 1 application rectally every 2 (two) hours as needed for hemorrhoids. pain       No current facility-administered medications for this visit.    Review of Systems  Review of Systems  Constitutional: Negative for fever, chills and unexpected weight change.  HENT: Negative for hearing loss, congestion, sore throat, trouble swallowing and voice change.   Eyes: Negative for visual disturbance.  Respiratory: Negative for cough and wheezing.   Cardiovascular: Negative for chest pain, palpitations and leg swelling.  Gastrointestinal: Positive for constipation. Negative for nausea, vomiting, abdominal pain, diarrhea, blood in stool, abdominal distention and anal bleeding.  Genitourinary: Negative for hematuria, vaginal bleeding and difficulty urinating.       Tubes tied  Musculoskeletal: Negative for arthralgias.  Skin: Negative for rash and wound.  Neurological: Negative for seizures, syncope and headaches.  Hematological:  Negative for adenopathy. Does not bruise/bleed easily.  Psychiatric/Behavioral: Negative for confusion.    Blood pressure 134/82, pulse 76, temperature 98 F (36.7 C), resp. rate 18, height 5\' 8"  (1.727 m), weight 179 lb (81.194 kg), last menstrual period 03/23/2013.  Physical Exam Physical Exam  Vitals reviewed. Constitutional: She is oriented to person, place, and time. She appears well-developed and well-nourished. No distress.  HENT:  Head: Normocephalic and atraumatic.  Eyes: Conjunctivae are normal. No scleral icterus.  Neck: Normal range of motion. No tracheal deviation present.  Cardiovascular: Normal rate, regular rhythm and normal heart sounds.   Pulmonary/Chest: Effort normal and breath sounds normal. She has no wheezes.  Abdominal: Soft. She exhibits no distension. There is no tenderness. There is no rebound and no guarding.  Genitourinary: Rectal exam shows no external hemorrhoid, no fissure and anal tone normal.  4 small rectal skin tags. Good tone. No ext hemorrhoid. Anoscopy - 3 column grade 1 internal hemorrhoids.  Musculoskeletal: She exhibits no edema and no tenderness.  Lymphadenopathy:    She has no cervical adenopathy.  Neurological: She is alert and oriented to person, place, and time. She exhibits normal muscle tone.  Skin: Skin is warm and dry. No rash noted. She is not diaphoretic. No erythema.  Psychiatric: She has a normal mood and affect. Her behavior is normal. Judgment and thought content normal.    Data Reviewed ED notes from 03/2013; winter 2014  Assessment    3 column internal hemorrhoids, Grade 1 Rectal skin tags     Plan    We discussed the etiology of hemorrhoids. The patient was given educational material as well as diagrams. We discussed nonoperative and operative management of hemorrhoidal disease.  We discussed the importance of having a daily soft bowel movement and avoiding constipation. We also discussed good bowel habits such as not  reading in the bathroom, not straining, and drinking 6-8 glasses of water per day. We also discussed the importance of a high fiber diet. We discussed foods that were high in fiber as well as fiber supplements. We discussed the importance of trying to get 25-30 g of fiber per day in their diet. We discussed the need to start with a low dose of fiber and then gradually increasing their daily fiber dose over several weeks in order to avoid bloating and cramping.  PLAN:  The patient has poor underlying bowel habits. I believe by correcting these habits she will see significant improvement in her hemorrhoidal burden. She was instructed to increase the amount of water she is drinking on a daily basis, about the high fiber diet. We also discussed cutting back the amount of time she sits in the commode for. She was given Agricultural engineer. She was instructed to call if her symptoms persisted. I explained That these new habits would have to be  done on a daily basis to see any significant improvement and it may take several weeks to notice an improvement. I also informed her that these would need to be lifelong habits.  Mary Sella. Andrey Campanile, MD, FACS General, Bariatric, & Minimally Invasive Surgery [facility], [place]          [facility] M 05/01/2013, 10:11 AM

## 2013-05-01 NOTE — Patient Instructions (Signed)
GETTING TO GOOD BOWEL HEALTH. Irregular bowel habits such as constipation and diarrhea can lead to many problems over time.  Having one soft bowel movement a day is the most important way to prevent further problems.  The anorectal canal is designed to handle stretching and feces to safely manage our ability to get rid of solid waste (feces, poop, stool) out of our body.  BUT, hard constipated stools can act like ripping concrete bricks and diarrhea can be a burning fire to this very sensitive area of our body, causing inflamed hemorrhoids, anal fissures, increasing risk is perirectal abscesses, abdominal pain/bloating, an making irritable bowel worse.     The goal: ONE SOFT BOWEL MOVEMENT A DAY!  To have soft, regular bowel movements:    Drink at least 8 tall glasses of water a day.     Take plenty of fiber.  Fiber is the undigested part of plant food that passes into the colon, acting s "natures broom" to encourage bowel motility and movement.  Fiber can absorb and hold large amounts of water. This results in a larger, bulkier stool, which is soft and easier to pass. Work gradually over several weeks up to 6 servings a day of fiber (25g a day even more if needed) in the form of: o Vegetables -- Root (potatoes, carrots, turnips), leafy green (lettuce, salad greens, celery, spinach), or cooked high residue (cabbage, broccoli, etc) o Fruit -- Fresh (unpeeled skin & pulp), Dried (prunes, apricots, cherries, etc ),  or stewed ( applesauce)  o Whole grain breads, pasta, etc (whole wheat)  o Bran cereals    Bulking Agents -- This type of water-retaining fiber generally is easily obtained each day by one of the following:  o Psyllium bran -- The psyllium plant is remarkable because its ground seeds can retain so much water. This product is available as Metamucil, Konsyl, Effersyllium, Per Diem Fiber, or the less expensive generic preparation in drug and health food stores. Although labeled a laxative, it really  is not a laxative.  o Methylcellulose -- This is another fiber derived from wood which also retains water. It is available as Citrucel. o Polyethylene Glycol - and "artificial" fiber commonly called Miralax or Glycolax.  It is helpful for people with gassy or bloated feelings with regular fiber o Flax Seed - a less gassy fiber than psyllium   No reading or other relaxing activity while on the toilet. If bowel movements take longer than 5 minutes, you are too constipated   AVOID CONSTIPATION.  High fiber and water intake usually takes care of this.  Sometimes a laxative is needed to stimulate more frequent bowel movements, but    Laxatives are not a good long-term solution as it can wear the colon out. o Osmotics (Milk of Magnesia, Fleets phosphosoda, Magnesium citrate, MiraLax, GoLytely) are safer than  o Stimulants (Senokot, Castor Oil, Dulcolax, Ex Lax)    o Do not take laxatives for more than 7days in a row.    IF SEVERELY CONSTIPATED, try a Bowel Retraining Program: o Do not use laxatives.  o Eat a diet high in roughage, such as bran cereals and leafy vegetables.  o Drink six (6) ounces of prune or apricot juice each morning.  o Eat two (2) large servings of stewed fruit each day.  o Take one (1) heaping tablespoon of a psyllium-based bulking agent twice a day. Use sugar-free sweetener when possible to avoid excessive calories.  o Eat a normal breakfast.  o   Set aside 15 minutes after breakfast to sit on the toilet, but do not strain to have a bowel movement.  o If you do not have a bowel movement by the third day, use an enema and repeat the above steps.  o  

## 2013-11-18 ENCOUNTER — Emergency Department (HOSPITAL_COMMUNITY)
Admission: EM | Admit: 2013-11-18 | Discharge: 2013-11-18 | Disposition: A | Discharge status: Home / Self Care | Payer: Medicaid Other | Attending: Emergency Medicine | Admitting: Emergency Medicine

## 2013-11-18 ENCOUNTER — Encounter (HOSPITAL_COMMUNITY): Payer: Self-pay | Admitting: Emergency Medicine

## 2013-11-18 DIAGNOSIS — Z8659 Personal history of other mental and behavioral disorders: Secondary | ICD-10-CM | POA: Insufficient documentation

## 2013-11-18 DIAGNOSIS — K602 Anal fissure, unspecified: Secondary | ICD-10-CM

## 2013-11-18 DIAGNOSIS — Z862 Personal history of diseases of the blood and blood-forming organs and certain disorders involving the immune mechanism: Secondary | ICD-10-CM | POA: Insufficient documentation

## 2013-11-18 DIAGNOSIS — Z8632 Personal history of gestational diabetes: Secondary | ICD-10-CM | POA: Insufficient documentation

## 2013-11-18 DIAGNOSIS — F17200 Nicotine dependence, unspecified, uncomplicated: Secondary | ICD-10-CM | POA: Insufficient documentation

## 2013-11-18 DIAGNOSIS — IMO0002 Reserved for concepts with insufficient information to code with codable children: Secondary | ICD-10-CM | POA: Insufficient documentation

## 2013-11-18 DIAGNOSIS — K6289 Other specified diseases of anus and rectum: Secondary | ICD-10-CM

## 2013-11-18 MED ORDER — POLYETHYLENE GLYCOL 3350 17 GM/SCOOP PO POWD: 17.0000 g | Freq: Every day | ORAL | Status: DC

## 2013-11-18 MED ORDER — LIDOCAINE HCL 2 % EX GEL
Freq: Once | CUTANEOUS | Status: AC
  Administered 2013-11-18: 20 via TOPICAL
  Filled 2013-11-18: qty 20

## 2013-11-18 MED ORDER — LIDOCAINE HCL 2 % EX GEL: 1.0000 "application " | Freq: Two times a day (BID) | CUTANEOUS | Status: DC | PRN

## 2013-11-18 NOTE — Discharge Instructions (Signed)
You were seen and evaluated for a year rectal pain. At this time it is recommended that you continue to followup with the general surgeon or primary care provider for continued evaluation and treatment. Use warm sitz baths to help with comfort and healing. Use a stool soft to have soft bowel movements.     Anal Fissure, Adult An anal fissure is a small tear or crack in the skin around the opening of the butt (anus).Bleeding from the tear or crack usually stops on its own within a few minutes. The bleeding may happen every time you poop until the tear or crack heals. HOME CARE  Eat lots of fruit, whole grains, and vegetables. Avoid foods like bananas and dairy products. These foods can make it hard to poop.  Take a warm water bath (sitz bath) as told by your doctor.  Drink enough fluids to keep your pee (urine) clear or pale yellow.  Only take medicines as told by your doctor. Do not take aspirin.  Do not use numbing creams or hydrocortisone cream on the area. These creams can slow healing. GET HELP RIGHT AWAY IF:  Your tear or crack is not healed in 3 days.  You have more bleeding.  You have a fever.  You have watery poop (diarrhea) mixed with blood.  You have pain.  You are getting worse, not better. MAKE SURE YOU:   Understand these instructions.  Will watch your condition.  Will get help right away if you are not doing well or get worse. Document Released: 05/09/2011 Document Revised: 12/03/2011 Document Reviewed: 05/09/2011 [facility] Patient Information 2014 [place], [place].   Hemorrhoids Hemorrhoids are swollen veins around the rectum or anus. There are two types of hemorrhoids:   Internal hemorrhoids. These occur in the veins just inside the rectum. They may poke through to the outside and become irritated and painful.  External hemorrhoids. These occur in the veins outside the anus and can be felt as a painful swelling or hard lump near the  anus. CAUSES  Pregnancy.   Obesity.   Constipation or diarrhea.   Straining to have a bowel movement.   Sitting for long periods on the toilet.  Heavy lifting or other activity that caused you to strain.  Anal intercourse. SYMPTOMS   Pain.   Anal itching or irritation.   Rectal bleeding.   Fecal leakage.   Anal swelling.   One or more lumps around the anus.  DIAGNOSIS  Your caregiver may be able to diagnose hemorrhoids by visual examination. Other examinations or tests that may be performed include:   Examination of the rectal area with a gloved hand (digital rectal exam).   Examination of anal canal using a small tube (scope).   A blood test if you have lost a significant amount of blood.  A test to look inside the colon (sigmoidoscopy or colonoscopy). TREATMENT Most hemorrhoids can be treated at home. However, if symptoms do not seem to be getting better or if you have a lot of rectal bleeding, your caregiver may perform a procedure to help make the hemorrhoids get smaller or remove them completely. Possible treatments include:   Placing a rubber band at the base of the hemorrhoid to cut off the circulation (rubber band ligation).   Injecting a chemical to shrink the hemorrhoid (sclerotherapy).   Using a tool to burn the hemorrhoid (infrared light therapy).   Surgically removing the hemorrhoid (hemorrhoidectomy).   Stapling the hemorrhoid to block blood flow to the  tissue (hemorrhoid stapling).  HOME CARE INSTRUCTIONS   Eat foods with fiber, such as whole grains, beans, nuts, fruits, and vegetables. Ask your doctor about taking products with added fiber in them (fibersupplements).  Increase fluid intake. Drink enough water and fluids to keep your urine clear or pale yellow.   Exercise regularly.   Go to the bathroom when you have the urge to have a bowel movement. Do not wait.   Avoid straining to have bowel movements.   Keep the  anal area dry and clean. Use wet toilet paper or moist towelettes after a bowel movement.   Medicated creams and suppositories may be used or applied as directed.   Only take over-the-counter or prescription medicines as directed by your caregiver.   Take warm sitz baths for 15 20 minutes, 3 4 times a day to ease pain and discomfort.   Place ice packs on the hemorrhoids if they are tender and swollen. Using ice packs between sitz baths may be helpful.   Put ice in a plastic bag.   Place a towel between your skin and the bag.   Leave the ice on for 15 20 minutes, 3 4 times a day.   Do not use a donut-shaped pillow or sit on the toilet for long periods. This increases blood pooling and pain.  SEEK MEDICAL CARE IF:  You have increasing pain and swelling that is not controlled by treatment or medicine.  You have uncontrolled bleeding.  You have difficulty or you are unable to have a bowel movement.  You have pain or inflammation outside the area of the hemorrhoids. MAKE SURE YOU:  Understand these instructions.  Will watch your condition.  Will get help right away if you are not doing well or get worse. Document Released: 09/07/2000 Document Revised: 08/27/2012 Document Reviewed: 07/15/2012 [facility] Patient Information 2014 [place], [place].

## 2013-11-18 NOTE — ED Notes (Signed)
C/o hemorrhoidal pain and nausea x 3 days. shes tried several otc meds with no relief of the pain

## 2013-11-18 NOTE — ED Provider Notes (Signed)
CSN: [id number]     Arrival date & time 11/18/13  1844 History   First MD Initiated Contact with Patient 11/18/13 2019     Chief Complaint  Patient presents with  . Hemorrhoids   HPI  History provided by the patient and previous medical charts. Patient is a 33 year old female with history of schizophrenia, depression, anxiety, diabetes and internal hemorrhoids who presents with complaints of rectal pain. Patient states that she has had a problem with hemorrhoids for the past several years. Over the last 3 days she has had another flareup of pain in the rectal area. She reports seeing one episode of very small amounts of blood but no other bleeding. She denies having any constipation or diarrhea. She does use a stool softener every other day and states her stools are generally soft. Patient was seen for similar symptoms multiple times in the past in emergency department as well as Gen. surgery office last August. At that time she had grade 1 internal hemorrhoids with recommendations for continued symptomatic treatment. She denies having any other associated symptoms with her complaint today. No abdominal pain. No fever, chills or sweats. No vomiting.    Past Medical History  Diagnosis Date  . H/O cesarean section 2010  . Vaginal delivery 2004 , 2008, 2009  . Diabetes mellitus     gestational  . Anemia   . Panic attacks   . Depression   . Schizophrenia   . Hemorrhoids    Past Surgical History  Procedure Laterality Date  . Tubal ligation  2010  . Cesarean section     Family History  Problem Relation Age of Onset  . Hypertension Other   . Hypertension Mother    History  Substance Use Topics  . Smoking status: Current Every Day Smoker -- 0.50 packs/day    Types: Cigarettes  . Smokeless tobacco: Not on file  . Alcohol Use: Yes     Comment: ocassionally   OB History   Grav Para Term Preterm Abortions TAB SAB Ect Mult Living   4 4 3 1      5      Review of Systems    Constitutional: Negative for fever, chills and diaphoresis.  Gastrointestinal: Positive for rectal pain. Negative for vomiting, abdominal pain, diarrhea, constipation, blood in stool and anal bleeding.  All other systems reviewed and are negative.      Allergies  Review of patient's allergies indicates no known allergies.  Home Medications   Current Outpatient Rx  Name  Route  Sig  Dispense  Refill  . Aspirin-Acetaminophen-Caffeine (GOODY HEADACHE PO)   Oral   Take 1 Package by mouth daily as needed (fr pain).         . hydrocortisone (ANUSOL-HC) 2.5 % rectal cream   Rectal   Place 1 application rectally 2 (two) times daily. Apply rectally 2 times daily         . phenylephrine-shark liver oil-mineral oil-petrolatum (PREPARATION H) 0.25-3-14-71.9 % rectal ointment   Rectal   Place 1 application rectally 2 (two) times daily as needed for hemorrhoids. pain         . lidocaine (XYLOCAINE JELLY) 2 % jelly   Topical   Apply 1 application topically 2 (two) times daily as needed.   30 mL   0   . polyethylene glycol powder (GLYCOLAX/MIRALAX) powder   Oral   Take 17 g by mouth daily.   255 g   0    BP 111/68  Pulse  100  Temp(Src) 98.6 F (37 C) (Oral)  Resp 18  Ht 5\' 6"  (1.676 m)  Wt 161 lb (73.029 kg)  BMI 26.00 kg/m2  SpO2 98% Physical Exam  Nursing note and vitals reviewed. Constitutional: She is oriented to person, place, and time. She appears well-developed and well-nourished. No distress.  HENT:  Head: Normocephalic.  Cardiovascular: Normal rate and regular rhythm.   No murmur heard. Pulmonary/Chest: Effort normal and breath sounds normal. No respiratory distress. She has no wheezes. She has no rales.  Abdominal: Soft. She exhibits no distension. There is no tenderness. There is no rebound.  Genitourinary:     Chaperone present. There are a few external hemorrhoids that do not appear thrombosed or tender. There is an area of rawness and slight  fissuring to the posterior edge. No bleeding. Area is tender. Gross blood per rectum.  Neurological: She is alert and oriented to person, place, and time.  Skin: Skin is warm and dry. No rash noted.  Psychiatric: She has a normal mood and affect. Her behavior is normal.    ED Course  Procedures   DIAGNOSTIC STUDIES: Oxygen Saturation is 99% on room air.    COORDINATION OF CARE:  Nursing notes reviewed. Vital signs reviewed. Initial pt interview and examination performed.   8:57 PM-patient seen and evaluated. She appears well no acute distress. Discussed continued treatment plan and the need to followup with primary care provider or general surgeon. Patient expressed her understanding.  Treatment plan initiated: Medications  lidocaine (XYLOCAINE) 2 % jelly (not administered)    MDM   Final diagnoses:  Rectal pain  Anal fissure        Angus Sellereter S Lachina Salsberry, PA-C 11/18/13 2102

## 2013-11-19 NOTE — ED Provider Notes (Signed)
Medical screening examination/treatment/procedure(s) were performed by non-physician practitioner and as supervising physician I was immediately available for consultation/collaboration.  EKG Interpretation   None        Starsky Nanna, MD 11/19/13 1617 

## 2014-07-26 ENCOUNTER — Encounter (HOSPITAL_COMMUNITY): Payer: Self-pay | Admitting: Emergency Medicine

## 2014-09-24 HISTORY — PX: OTHER SURGICAL HISTORY: SHX169

## 2015-01-25 ENCOUNTER — Emergency Department (HOSPITAL_COMMUNITY)
Admission: EM | Admit: 2015-01-25 | Discharge: 2015-01-25 | Disposition: A | Discharge status: Home / Self Care | Payer: Medicaid Other | Source: Home / Self Care | Attending: Emergency Medicine | Admitting: Emergency Medicine

## 2015-01-25 ENCOUNTER — Encounter (HOSPITAL_COMMUNITY): Payer: Self-pay | Admitting: Emergency Medicine

## 2015-01-25 ENCOUNTER — Emergency Department (HOSPITAL_COMMUNITY)
Admission: EM | Admit: 2015-01-25 | Discharge: 2015-01-25 | Disposition: A | Discharge status: Home / Self Care | Payer: Medicaid Other | Attending: Emergency Medicine | Admitting: Emergency Medicine

## 2015-01-25 DIAGNOSIS — Z7952 Long term (current) use of systemic steroids: Secondary | ICD-10-CM

## 2015-01-25 DIAGNOSIS — Z862 Personal history of diseases of the blood and blood-forming organs and certain disorders involving the immune mechanism: Secondary | ICD-10-CM

## 2015-01-25 DIAGNOSIS — Z8659 Personal history of other mental and behavioral disorders: Secondary | ICD-10-CM | POA: Insufficient documentation

## 2015-01-25 DIAGNOSIS — R6883 Chills (without fever): Secondary | ICD-10-CM

## 2015-01-25 DIAGNOSIS — K644 Residual hemorrhoidal skin tags: Secondary | ICD-10-CM | POA: Diagnosis not present

## 2015-01-25 DIAGNOSIS — Z8632 Personal history of gestational diabetes: Secondary | ICD-10-CM

## 2015-01-25 DIAGNOSIS — Z720 Tobacco use: Secondary | ICD-10-CM | POA: Insufficient documentation

## 2015-01-25 DIAGNOSIS — F419 Anxiety disorder, unspecified: Secondary | ICD-10-CM | POA: Insufficient documentation

## 2015-01-25 DIAGNOSIS — G8929 Other chronic pain: Secondary | ICD-10-CM | POA: Insufficient documentation

## 2015-01-25 DIAGNOSIS — K6289 Other specified diseases of anus and rectum: Secondary | ICD-10-CM | POA: Insufficient documentation

## 2015-01-25 DIAGNOSIS — Z9851 Tubal ligation status: Secondary | ICD-10-CM | POA: Insufficient documentation

## 2015-01-25 DIAGNOSIS — Z79899 Other long term (current) drug therapy: Secondary | ICD-10-CM

## 2015-01-25 LAB — CBG MONITORING, ED: Glucose-Capillary: 99 mg/dL (ref 70–99)

## 2015-01-25 MED ORDER — POLYETHYLENE GLYCOL 3350 17 GM/SCOOP PO POWD: 17.0000 g | Freq: Two times a day (BID) | ORAL | Status: DC

## 2015-01-25 MED ORDER — HYDROCORTISONE ACETATE 25 MG RE SUPP
25.0000 mg | Freq: Once | RECTAL | Status: AC
  Administered 2015-01-25: 25 mg via RECTAL
  Filled 2015-01-25: qty 1

## 2015-01-25 MED ORDER — HYDROMORPHONE HCL 1 MG/ML IJ SOLN
1.0000 mg | Freq: Once | INTRAMUSCULAR | Status: AC
  Administered 2015-01-25: 1 mg via INTRAMUSCULAR
  Filled 2015-01-25: qty 1

## 2015-01-25 MED ORDER — LIDOCAINE HCL 2 % EX GEL
1.0000 "application " | Freq: Once | CUTANEOUS | Status: AC
  Administered 2015-01-25: 1 via TOPICAL
  Filled 2015-01-25: qty 20

## 2015-01-25 MED ORDER — HYDROCORTISONE ACETATE 25 MG RE SUPP: 25.0000 mg | Freq: Two times a day (BID) | RECTAL | Status: DC

## 2015-01-25 MED ORDER — LIDOCAINE HCL 2 % EX GEL
1.0000 | Freq: Two times a day (BID) | CUTANEOUS | Status: DC | PRN
Start: 2015-01-25 — End: 2017-03-21

## 2015-01-25 NOTE — Discharge Instructions (Signed)
Please follow up with your primary care physician in 1-2 days. If you do not have one please call the [facility] listed above. Please follow up with central  surgery to schedule a follow up appointment.  Please read all discharge instructions and return precautions.    Hemorrhoids Hemorrhoids are swollen veins around the rectum or anus. There are two types of hemorrhoids:   Internal hemorrhoids. These occur in the veins just inside the rectum. They may poke through to the outside and become irritated and painful.  External hemorrhoids. These occur in the veins outside the anus and can be felt as a painful swelling or hard lump near the anus. CAUSES  Pregnancy.   Obesity.   Constipation or diarrhea.   Straining to have a bowel movement.   Sitting for long periods on the toilet.  Heavy lifting or other activity that caused you to strain.  Anal intercourse. SYMPTOMS   Pain.   Anal itching or irritation.   Rectal bleeding.   Fecal leakage.   Anal swelling.   One or more lumps around the anus.  DIAGNOSIS  Your caregiver may be able to diagnose hemorrhoids by visual examination. Other examinations or tests that may be performed include:   Examination of the rectal area with a gloved hand (digital rectal exam).   Examination of anal canal using a small tube (scope).   A blood test if you have lost a significant amount of blood.  A test to look inside the colon (sigmoidoscopy or colonoscopy). TREATMENT Most hemorrhoids can be treated at home. However, if symptoms do not seem to be getting better or if you have a lot of rectal bleeding, your caregiver may perform a procedure to help make the hemorrhoids get smaller or remove them completely. Possible treatments include:   Placing a rubber band at the base of the hemorrhoid to cut off the circulation (rubber band ligation).   Injecting a chemical to shrink the hemorrhoid  (sclerotherapy).   Using a tool to burn the hemorrhoid (infrared light therapy).   Surgically removing the hemorrhoid (hemorrhoidectomy).   Stapling the hemorrhoid to block blood flow to the tissue (hemorrhoid stapling).  HOME CARE INSTRUCTIONS   Eat foods with fiber, such as whole grains, beans, nuts, fruits, and vegetables. Ask your doctor about taking products with added fiber in them (fibersupplements).  Increase fluid intake. Drink enough water and fluids to keep your urine clear or pale yellow.   Exercise regularly.   Go to the bathroom when you have the urge to have a bowel movement. Do not wait.   Avoid straining to have bowel movements.   Keep the anal area dry and clean. Use wet toilet paper or moist towelettes after a bowel movement.   Medicated creams and suppositories may be used or applied as directed.   Only take over-the-counter or prescription medicines as directed by your caregiver.   Take warm sitz baths for 15-20 minutes, 3-4 times a day to ease pain and discomfort.   Place ice packs on the hemorrhoids if they are tender and swollen. Using ice packs between sitz baths may be helpful.   Put ice in a plastic bag.   Place a towel between your skin and the bag.   Leave the ice on for 15-20 minutes, 3-4 times a day.   Do not use a donut-shaped pillow or sit on the toilet for long periods. This increases blood pooling and pain.  SEEK MEDICAL CARE  IF:  You have increasing pain and swelling that is not controlled by treatment or medicine.  You have uncontrolled bleeding.  You have difficulty or you are unable to have a bowel movement.  You have pain or inflammation outside the area of the hemorrhoids. MAKE SURE YOU:  Understand these instructions.  Will watch your condition.  Will get help right away if you are not doing well or get worse. Document Released: 09/07/2000 Document Revised: 08/27/2012 Document Reviewed:  07/15/2012 [facility] Patient Information 2015 [place], [place]. This information is not intended to replace advice given to you by your health care provider. Make sure you discuss any questions you have with your health care provider.

## 2015-01-25 NOTE — Discharge Instructions (Signed)
Hemorrhoids Hemorrhoids are puffy (swollen) veins around the rectum or anus. Hemorrhoids can cause pain, itching, bleeding, or irritation. HOME CARE  Eat foods with fiber, such as whole grains, beans, nuts, fruits, and vegetables. Ask your doctor about taking products with added fiber in them (fibersupplements).  Drink enough fluid to keep your pee (urine) clear or pale yellow.  Exercise often.  Go to the bathroom when you have the urge to poop. Do not wait.  Avoid straining to poop (bowel movement).  Keep the butt area dry and clean. Use wet toilet paper or moist paper towels.  Medicated creams and medicine inserted into the anus (anal suppository) may be used or applied as told.  Only take medicine as told by your doctor.  Take a warm water bath (sitz bath) for 15-20 minutes to ease pain. Do this 3-4 times a day.  Place ice packs on the area if it is tender or puffy. Use the ice packs between the warm water baths.  Put ice in a plastic bag.  Place a towel between your skin and the bag.  Leave the ice on for 15-20 minutes, 03-04 times a day.  Do not use a donut-shaped pillow or sit on the toilet for a long time. GET HELP RIGHT AWAY IF:   You have more pain that is not controlled by treatment or medicine.  You have bleeding that will not stop.  You have trouble or are unable to poop (bowel movement).  You have pain or puffiness outside the area of the hemorrhoids. MAKE SURE YOU:   Understand these instructions.  Will watch your condition.  Will get help right away if you are not doing well or get worse. Document Released: 06/19/2008 Document Revised: 08/27/2012 Document Reviewed: 07/22/2012 [employer] Patient Information 2015 [employer]. This information is not intended to replace advice given to you by your health care provider. Make sure you discuss any questions you have with your health care provider.  

## 2015-01-25 NOTE — ED Notes (Signed)
Bed: [facility]PT1 Expected date:  Expected time:  Means of arrival:  Comments: EMS 

## 2015-01-25 NOTE — ED Provider Notes (Signed)
CSN: [id number]     Arrival date & time 01/25/15  2001 History   First MD Initiated Contact with Patient 01/25/15 2023     Chief Complaint  Patient presents with  . Hemorrhoids     (Consider location/radiation/quality/duration/timing/severity/associated sxs/prior Treatment) HPI Comments: Patient returns to ED complaining of persistent rectal pain despite taking medication as directed yesterday. Patient with recurrent hemorrhoid pain, has seen surgery but surgical intervention was not indicated at that time.  Patient appears uncomfortable, is diaphoretic.  Patient is a 34 y.o. female presenting with GI illness.  GI Problem This is a recurrent problem. The current episode started yesterday. The problem has been gradually worsening. Associated symptoms include chills. Pertinent negatives include no fever.    Past Medical History  Diagnosis Date  . H/O cesarean section 2010  . Vaginal delivery 2004 , 2008, 2009  . Diabetes mellitus     gestational  . Anemia   . Panic attacks   . Depression   . Schizophrenia   . Hemorrhoids    Past Surgical History  Procedure Laterality Date  . Tubal ligation  2010  . Cesarean section     Family History  Problem Relation Age of Onset  . Hypertension Other   . Hypertension Mother    History  Substance Use Topics  . Smoking status: Current Every Day Smoker -- 0.50 packs/day    Types: Cigarettes  . Smokeless tobacco: Not on file  . Alcohol Use: Yes     Comment: ocassionally   OB History    Gravida Para Term Preterm AB TAB SAB Ectopic Multiple Living   Review of Systems  Constitutional: Positive for chills. Negative for fever.  Psychiatric/Behavioral: The patient is nervous/anxious.   All other systems reviewed and are negative.     Allergies  Review of patient's allergies indicates no known allergies.  Home Medications   Prior to Admission medications   Medication Sig Start Date End Date Taking? Authorizing  Provider  Aspirin-Acetaminophen-Caffeine (GOODY HEADACHE PO) Take 1 Package by mouth daily as needed (fr pain).    Historical Provider, MD  hydrocortisone (ANUSOL-HC) 2.5 % rectal cream Place 1 application rectally 2 (two) times daily. Apply rectally 2 times daily 04/03/13   Francee Piccolo, PA-C  lidocaine (XYLOCAINE JELLY) 2 % jelly Apply 1 application topically 2 (two) times daily as needed. 11/18/13   Ivonne Andrew, PA-C  lidocaine (XYLOCAINE) 2 % jelly Apply 1 application topically 2 (two) times daily as needed. 01/25/15   Jennifer Piepenbrink, PA-C  phenylephrine-shark liver oil-mineral oil-petrolatum (PREPARATION H) 0.25-3-14-71.9 % rectal ointment Place 1 application rectally 2 (two) times daily as needed for hemorrhoids. pain    Historical Provider, MD  polyethylene glycol powder (GLYCOLAX/MIRALAX) powder Take 17 g by mouth daily. 11/18/13   Ivonne Andrew, PA-C  polyethylene glycol powder (GLYCOLAX/MIRALAX) powder Take 17 g by mouth 2 (two) times daily. Until daily soft stools  OTC 01/25/15   Jennifer Piepenbrink, PA-C   BP 119/98 mmHg  Pulse 94  Temp(Src) 97.9 F (36.6 C) (Oral)  Resp 20  SpO2 100% Physical Exam  Constitutional: She is oriented to person, place, and time. She appears well-developed and well-nourished.  HENT:  Head: Normocephalic and atraumatic.  Eyes: Conjunctivae are normal.  Cardiovascular: Normal rate and regular rhythm.   Pulmonary/Chest: Effort normal and breath sounds normal.  Abdominal: Soft. Bowel sounds are normal.  Genitourinary: Rectal exam shows no fissure  and anal tone normal.  No mass palpated on digital rectal exam.  Musculoskeletal: She exhibits no edema or tenderness.  Lymphadenopathy:    She has no cervical adenopathy.  Neurological: She is alert and oriented to person, place, and time.  Skin: Skin is warm. She is diaphoretic.  Psychiatric: Her mood appears anxious.  Nursing note and vitals reviewed.   ED Course  Procedures (including  critical care time) Labs Review Labs Reviewed - No data to display  Imaging Review No results found.   EKG Interpretation None     Rectal pain.  History of hemorrhoids.  Pain improved after medications.  Patient to follow-up with her PCP.  Return precautions discussed. MDM   Final diagnoses:  None    Rectal pain. Hemorrhoids.    Felicie Mornavid Ina Poupard, NP 01/25/15 [id number]  Richardean Canalavid H Yao, MD 01/25/15 [phone]

## 2015-01-25 NOTE — ED Provider Notes (Signed)
CSN: [id number]     Arrival date & time 01/25/15  7829 History   First MD Initiated Contact with Patient 01/25/15 0845     Chief Complaint  Patient presents with  . Hemorrhoids     (Consider location/radiation/quality/duration/timing/severity/associated sxs/prior Treatment) HPI Comments: Patient is a 34 yo F history of schizophrenia, depression, anxiety, diabetes and internal hemorrhoids who presents with complaints of rectal pain. Patient states that she has had a problem with hemorrhoids for the past several years. Over the last 3 days she has had another flareup of pain in the rectal area. Denies any bloody or black stools. She has not been using the stool softeners recently. States she saw the surgeon two years ago and was advised against surgery. Denies fevers, chills, nausea, vomiting, constipation, abdominal pain.   The history is provided by the patient.    Past Medical History  Diagnosis Date  . H/O cesarean section 2010  . Vaginal delivery 2004 , 2008, 2009  . Diabetes mellitus     gestational  . Anemia   . Panic attacks   . Depression   . Schizophrenia   . Hemorrhoids    Past Surgical History  Procedure Laterality Date  . Tubal ligation  2010  . Cesarean section     Family History  Problem Relation Age of Onset  . Hypertension Other   . Hypertension Mother    History  Substance Use Topics  . Smoking status: Current Every Day Smoker -- 0.50 packs/day    Types: Cigarettes  . Smokeless tobacco: Not on file  . Alcohol Use: Yes     Comment: ocassionally   OB History    Gravida Para Term Preterm AB TAB SAB Ectopic Multiple Living   Review of Systems  Gastrointestinal: Positive for rectal pain. Negative for nausea, vomiting, abdominal pain, diarrhea, constipation, blood in stool, abdominal distention and anal bleeding.  All other systems reviewed and are negative.     Allergies  Review of patient's allergies indicates no known  allergies.  Home Medications   Prior to Admission medications   Medication Sig Start Date End Date Taking? Authorizing Provider  Aspirin-Acetaminophen-Caffeine (GOODY HEADACHE PO) Take 1 Package by mouth daily as needed (fr pain).    Historical Provider, MD  hydrocortisone (ANUSOL-HC) 2.5 % rectal cream Place 1 application rectally 2 (two) times daily. Apply rectally 2 times daily 04/03/13   Francee Piccolo, PA-C  lidocaine (XYLOCAINE JELLY) 2 % jelly Apply 1 application topically 2 (two) times daily as needed. 11/18/13   Ivonne Andrew, PA-C  lidocaine (XYLOCAINE) 2 % jelly Apply 1 application topically 2 (two) times daily as needed. 01/25/15   Taneah Masri, PA-C  phenylephrine-shark liver oil-mineral oil-petrolatum (PREPARATION H) 0.25-3-14-71.9 % rectal ointment Place 1 application rectally 2 (two) times daily as needed for hemorrhoids. pain    Historical Provider, MD  polyethylene glycol powder (GLYCOLAX/MIRALAX) powder Take 17 g by mouth daily. 11/18/13   Ivonne Andrew, PA-C  polyethylene glycol powder (GLYCOLAX/MIRALAX) powder Take 17 g by mouth 2 (two) times daily. Until daily soft stools  OTC 01/25/15   Mee Macdonnell, PA-C   BP 130/67 mmHg  Temp(Src) 97.3 F (36.3 C) (Oral)  Resp 16  SpO2 100% Physical Exam  Constitutional: She is oriented to person, place, and time. She appears well-developed and well-nourished. No distress.  HENT:  Head: Normocephalic and atraumatic.  Right Ear: External ear normal.  Left  Ear: External ear normal.  Nose: Nose normal.  Mouth/Throat: Oropharynx is clear and moist.  Eyes: Conjunctivae are normal.  Neck: Normal range of motion. Neck supple.  No nuchal rigidity.   Cardiovascular: Normal rate.   Pulmonary/Chest: Effort normal.  Abdominal: Soft. There is no tenderness.  Genitourinary:     Musculoskeletal: Normal range of motion.  Neurological: She is alert and oriented to person, place, and time.  Skin: Skin is warm and dry. She  is not diaphoretic.  Psychiatric: She has a normal mood and affect.  Nursing note and vitals reviewed.   ED Course  Procedures (including critical care time) Medications  lidocaine (XYLOCAINE) 2 % jelly 1 application (1 application Topical Given 01/25/15 0901)    Labs Review Labs Reviewed - No data to display  Imaging Review No results found.   EKG Interpretation None      MDM   Final diagnoses:  External hemorrhoid    Filed Vitals:   01/25/15 0840  BP: 130/67  Temp: 97.3 F (36.3 C)  Resp: 16   Afebrile, NAD, non-toxic appearing, AAOx4.   She appears well no acute distress. Discussed continued treatment plan and the need to followup with primary care provider or general surgeon. Patient expressed her understanding. Return precautions discussed. Patient is agreeable to plan. Patient is stable at time of discharge.   Francee PiccoloJennifer Arya Boxley, PA-C 01/25/15 [phone]  Derwood KaplanAnkit Nanavati, MD 01/27/15 [phone]

## 2015-01-25 NOTE — ED Notes (Signed)
Pt reports chronic hemorrhoid pain starting yesterday. Pt unable to get relief from OTC meds. Was seen in ED yesterday and states med is not working.

## 2015-01-25 NOTE — ED Notes (Signed)
Per PT[facility], pt has been having rectal pain related to hemorrhoids x 3days. NAD at this time. Pt alert x4.

## 2015-01-26 ENCOUNTER — Encounter (HOSPITAL_COMMUNITY): Payer: Self-pay | Admitting: Emergency Medicine

## 2015-01-26 ENCOUNTER — Emergency Department (HOSPITAL_COMMUNITY)
Admission: EM | Admit: 2015-01-26 | Discharge: 2015-01-26 | Disposition: A | Discharge status: Home / Self Care | Payer: Medicaid Other | Attending: Emergency Medicine | Admitting: Emergency Medicine

## 2015-01-26 DIAGNOSIS — Z7952 Long term (current) use of systemic steroids: Secondary | ICD-10-CM | POA: Insufficient documentation

## 2015-01-26 DIAGNOSIS — Z9851 Tubal ligation status: Secondary | ICD-10-CM | POA: Diagnosis not present

## 2015-01-26 DIAGNOSIS — Z862 Personal history of diseases of the blood and blood-forming organs and certain disorders involving the immune mechanism: Secondary | ICD-10-CM | POA: Insufficient documentation

## 2015-01-26 DIAGNOSIS — Z79899 Other long term (current) drug therapy: Secondary | ICD-10-CM | POA: Diagnosis not present

## 2015-01-26 DIAGNOSIS — K644 Residual hemorrhoidal skin tags: Secondary | ICD-10-CM | POA: Insufficient documentation

## 2015-01-26 DIAGNOSIS — Z8659 Personal history of other mental and behavioral disorders: Secondary | ICD-10-CM | POA: Diagnosis not present

## 2015-01-26 DIAGNOSIS — Z720 Tobacco use: Secondary | ICD-10-CM | POA: Diagnosis not present

## 2015-01-26 DIAGNOSIS — Z8632 Personal history of gestational diabetes: Secondary | ICD-10-CM | POA: Insufficient documentation

## 2015-01-26 DIAGNOSIS — K6289 Other specified diseases of anus and rectum: Secondary | ICD-10-CM | POA: Diagnosis present

## 2015-01-26 MED ORDER — OXYCODONE-ACETAMINOPHEN 5-325 MG PO TABS
2.0000 | ORAL_TABLET | Freq: Once | ORAL | Status: AC
Start: 2015-01-26 — End: 2015-01-26
  Administered 2015-01-26: 2 via ORAL
  Filled 2015-01-26: qty 2

## 2015-01-26 NOTE — Discharge Instructions (Signed)
Continue applying Preparation H and topical lidocaine jelly. Follow-up with your primary care physician.  Hemorrhoids Hemorrhoids are swollen veins around the rectum or anus. There are two types of hemorrhoids:   Internal hemorrhoids. These occur in the veins just inside the rectum. They may poke through to the outside and become irritated and painful.  External hemorrhoids. These occur in the veins outside the anus and can be felt as a painful swelling or hard lump near the anus. CAUSES  Pregnancy.   Obesity.   Constipation or diarrhea.   Straining to have a bowel movement.   Sitting for long periods on the toilet.  Heavy lifting or other activity that caused you to strain.  Anal intercourse. SYMPTOMS   Pain.   Anal itching or irritation.   Rectal bleeding.   Fecal leakage.   Anal swelling.   One or more lumps around the anus.  DIAGNOSIS  Your caregiver may be able to diagnose hemorrhoids by visual examination. Other examinations or tests that may be performed include:   Examination of the rectal area with a gloved hand (digital rectal exam).   Examination of anal canal using a small tube (scope).   A blood test if you have lost a significant amount of blood.  A test to look inside the colon (sigmoidoscopy or colonoscopy). TREATMENT Most hemorrhoids can be treated at home. However, if symptoms do not seem to be getting better or if you have a lot of rectal bleeding, your caregiver may perform a procedure to help make the hemorrhoids get smaller or remove them completely. Possible treatments include:   Placing a rubber band at the base of the hemorrhoid to cut off the circulation (rubber band ligation).   Injecting a chemical to shrink the hemorrhoid (sclerotherapy).   Using a tool to burn the hemorrhoid (infrared light therapy).   Surgically removing the hemorrhoid (hemorrhoidectomy).   Stapling the hemorrhoid to block blood flow to the tissue  (hemorrhoid stapling).  HOME CARE INSTRUCTIONS   Eat foods with fiber, such as whole grains, beans, nuts, fruits, and vegetables. Ask your doctor about taking products with added fiber in them (fibersupplements).  Increase fluid intake. Drink enough water and fluids to keep your urine clear or pale yellow.   Exercise regularly.   Go to the bathroom when you have the urge to have a bowel movement. Do not wait.   Avoid straining to have bowel movements.   Keep the anal area dry and clean. Use wet toilet paper or moist towelettes after a bowel movement.   Medicated creams and suppositories may be used or applied as directed.   Only take over-the-counter or prescription medicines as directed by your caregiver.   Take warm sitz baths for 15-20 minutes, 3-4 times a day to ease pain and discomfort.   Place ice packs on the hemorrhoids if they are tender and swollen. Using ice packs between sitz baths may be helpful.   Put ice in a plastic bag.   Place a towel between your skin and the bag.   Leave the ice on for 15-20 minutes, 3-4 times a day.   Do not use a donut-shaped pillow or sit on the toilet for long periods. This increases blood pooling and pain.  SEEK MEDICAL CARE IF:  You have increasing pain and swelling that is not controlled by treatment or medicine.  You have uncontrolled bleeding.  You have difficulty or you are unable to have a bowel movement.  You have pain  or inflammation outside the area of the hemorrhoids. MAKE SURE YOU:  Understand these instructions.  Will watch your condition.  Will get help right away if you are not doing well or get worse. Document Released: 09/07/2000 Document Revised: 08/27/2012 Document Reviewed: 07/15/2012 [facility] Patient Information 2015 [place]. This information is not intended to replace advice given to you by your health care provider. Make sure you discuss any questions you have with your health care  provider.

## 2015-01-26 NOTE — ED Notes (Signed)
Bed: FA21WA12 Expected date:  Expected time:  Means of arrival:  Comments: hemmorhoids

## 2015-01-26 NOTE — ED Provider Notes (Signed)
CSN: [id number]     Arrival date & time 01/26/15  1745 History   First MD Initiated Contact with Patient 01/26/15 1749     Chief Complaint  Patient presents with  . Hemorrhoids     (Consider location/radiation/quality/duration/timing/severity/associated sxs/prior Treatment) HPI Comments: 34 year old female presents for the third time in 2 days complaining of chronic hemorrhoid pain. She was seen twice yesterday for the same, discharge home with Preparation H and lidocaine jelly which she reports her not working. She has an appointment tomorrow with her primary care physician, however states she cannot wait until then because the pain is so severe, 10/10. Denies any bloody stools. Pain increased with having a bowel movement. States it hurts to sit on her bottom.  The history is provided by the patient and the EMS personnel.    Past Medical History  Diagnosis Date  . H/O cesarean section 2010  . Vaginal delivery 2004 , 2008, 2009  . Diabetes mellitus     gestational  . Anemia   . Panic attacks   . Depression   . Schizophrenia   . Hemorrhoids    Past Surgical History  Procedure Laterality Date  . Tubal ligation  2010  . Cesarean section     Family History  Problem Relation Age of Onset  . Hypertension Other   . Hypertension Mother    History  Substance Use Topics  . Smoking status: Current Every Day Smoker -- 0.50 packs/day    Types: Cigarettes  . Smokeless tobacco: Not on file  . Alcohol Use: Yes     Comment: ocassionally   OB History    Gravida Para Term Preterm AB TAB SAB Ectopic Multiple Living   4 4 3 1      5      Review of Systems  Gastrointestinal: Positive for rectal pain.  All other systems reviewed and are negative.     Allergies  Review of patient's allergies indicates no known allergies.  Home Medications   Prior to Admission medications   Medication Sig Start Date End Date Taking? Authorizing Provider  Aspirin-Acetaminophen-Caffeine (GOODY  HEADACHE PO) Take 1 Package by mouth daily as needed (fr pain).   Yes Historical Provider, MD  hydrocortisone (ANUSOL-HC) 2.5 % rectal cream Place 1 application rectally 2 (two) times daily.  04/03/13  Yes Jennifer Piepenbrink, PA-C  hydrocortisone (ANUSOL-HC) 25 MG suppository Place 1 suppository (25 mg total) rectally 2 (two) times daily. For 7 days 01/25/15  Yes Felicie Mornavid Smith, NP  lidocaine (XYLOCAINE) 2 % jelly Apply 1 application topically 2 (two) times daily as needed. 01/25/15  Yes Jennifer Piepenbrink, PA-C  phenylephrine-shark liver oil-mineral oil-petrolatum (PREPARATION H) 0.25-3-14-71.9 % rectal ointment Place 1 application rectally 2 (two) times daily as needed for hemorrhoids (hemorrhoids). pain   Yes Historical Provider, MD  polyethylene glycol powder (GLYCOLAX/MIRALAX) powder Take 17 g by mouth 2 (two) times daily. Until daily soft stools  OTC 01/25/15  Yes Jennifer Piepenbrink, PA-C  lidocaine (XYLOCAINE JELLY) 2 % jelly Apply 1 application topically 2 (two) times daily as needed. Patient not taking: Reported on 01/25/2015 11/18/13   Ivonne AndrewPeter Dammen, PA-C  polyethylene glycol powder (GLYCOLAX/MIRALAX) powder Take 17 g by mouth daily. Patient not taking: Reported on 01/25/2015 11/18/13   Ivonne AndrewPeter Dammen, PA-C   BP 138/96 mmHg  Pulse 94  Temp(Src) 98.4 F (36.9 C) (Oral)  Resp 24  SpO2 100% Physical Exam  Constitutional: She is oriented to person, place, and time. She appears well-developed and well-nourished. No  distress.  HENT:  Head: Normocephalic and atraumatic.  Mouth/Throat: Oropharynx is clear and moist.  Eyes: Conjunctivae and EOM are normal.  Neck: Normal range of motion. Neck supple.  Cardiovascular: Normal rate, regular rhythm and normal heart sounds.   Pulmonary/Chest: Effort normal and breath sounds normal. No respiratory distress.  Genitourinary:  3 very small external hemorrhoids present. No thrombosis or bleeding. Tender. Digital rectal exam without mass.  Musculoskeletal:  Normal range of motion. She exhibits no edema.  Neurological: She is alert and oriented to person, place, and time. No sensory deficit.  Skin: Skin is warm and dry.  Psychiatric: She has a normal mood and affect. Her behavior is normal.  Nursing note and vitals reviewed.   ED Course  Procedures (including critical care time) Labs Review Labs Reviewed - No data to display  Imaging Review No results found.   EKG Interpretation None      MDM   Final diagnoses:  External hemorrhoids without complication   Nontoxic appearing, NAD. Vital signs stable. No thrombosis or bleeding. No internal mass. This is her third visit in 2 days. She has an appointment tomorrow with her PCP. I advised her to keep this. Pain treated in the ED, advised her to continue Preparation H and lidocaine jelly. The hemorrhoids are very small. Stable for d/c. Return precautions given. Patient states understanding of treatment care plan and is agreeable.   Kathrynn SpeedRobyn M Miriana Gaertner, PA-C 01/26/15 1839  Mancel BaleElliott Wentz, MD 01/27/15 1346

## 2015-01-26 NOTE — ED Notes (Signed)
Per EMS: Pt c/o of hemorrhoids, was seen yesterday for same. Pt has doctor's appointment tomorrow with surgeon but states she needs something for pain.

## 2015-05-28 ENCOUNTER — Emergency Department (HOSPITAL_COMMUNITY): Payer: Medicaid Other

## 2015-05-28 ENCOUNTER — Encounter (HOSPITAL_COMMUNITY): Payer: Self-pay | Admitting: Emergency Medicine

## 2015-05-28 ENCOUNTER — Encounter (HOSPITAL_COMMUNITY): Admission: EM | Disposition: A | Discharge status: Home / Self Care | Payer: Self-pay | Source: Home / Self Care

## 2015-05-28 ENCOUNTER — Inpatient Hospital Stay (HOSPITAL_COMMUNITY)
Admission: EM | Admit: 2015-05-28 | Discharge: 2015-06-04 | DRG: 989 | Disposition: A | Discharge status: Home / Self Care | Payer: Medicaid Other | Attending: Surgery | Admitting: Surgery

## 2015-05-28 ENCOUNTER — Observation Stay (HOSPITAL_COMMUNITY): Payer: Medicaid Other | Admitting: Registered Nurse

## 2015-05-28 DIAGNOSIS — K644 Residual hemorrhoidal skin tags: Secondary | ICD-10-CM | POA: Diagnosis present

## 2015-05-28 DIAGNOSIS — F17210 Nicotine dependence, cigarettes, uncomplicated: Secondary | ICD-10-CM | POA: Diagnosis present

## 2015-05-28 DIAGNOSIS — N739 Female pelvic inflammatory disease, unspecified: Secondary | ICD-10-CM | POA: Diagnosis present

## 2015-05-28 DIAGNOSIS — K5900 Constipation, unspecified: Secondary | ICD-10-CM | POA: Diagnosis present

## 2015-05-28 DIAGNOSIS — Z8632 Personal history of gestational diabetes: Secondary | ICD-10-CM

## 2015-05-28 DIAGNOSIS — F418 Other specified anxiety disorders: Secondary | ICD-10-CM | POA: Diagnosis present

## 2015-05-28 DIAGNOSIS — R319 Hematuria, unspecified: Secondary | ICD-10-CM | POA: Diagnosis present

## 2015-05-28 DIAGNOSIS — K611 Rectal abscess: Principal | ICD-10-CM | POA: Diagnosis present

## 2015-05-28 DIAGNOSIS — Z8249 Family history of ischemic heart disease and other diseases of the circulatory system: Secondary | ICD-10-CM

## 2015-05-28 DIAGNOSIS — F209 Schizophrenia, unspecified: Secondary | ICD-10-CM | POA: Diagnosis present

## 2015-05-28 DIAGNOSIS — D72829 Elevated white blood cell count, unspecified: Secondary | ICD-10-CM | POA: Diagnosis present

## 2015-05-28 DIAGNOSIS — E8770 Fluid overload, unspecified: Secondary | ICD-10-CM

## 2015-05-28 HISTORY — PX: INCISION AND DRAINAGE PERIRECTAL ABSCESS: SHX1804

## 2015-05-28 LAB — CBC
HEMATOCRIT: 34.7 % — AB (ref 36.0–46.0)
HEMOGLOBIN: 11.8 g/dL — AB (ref 12.0–15.0)
MCH: 33.1 pg (ref 26.0–34.0)
MCHC: 34 g/dL (ref 30.0–36.0)
MCV: 97.5 fL (ref 78.0–100.0)
Platelets: 239 10*3/uL (ref 150–400)
RBC: 3.56 MIL/uL — AB (ref 3.87–5.11)
RDW: 12.5 % (ref 11.5–15.5)
WBC: 18.3 10*3/uL — AB (ref 4.0–10.5)

## 2015-05-28 LAB — COMPREHENSIVE METABOLIC PANEL
ALBUMIN: 3.7 g/dL (ref 3.5–5.0)
ALK PHOS: 88 U/L (ref 38–126)
ALT: 12 U/L — AB (ref 14–54)
AST: 17 U/L (ref 15–41)
Anion gap: 10 (ref 5–15)
BILIRUBIN TOTAL: 0.6 mg/dL (ref 0.3–1.2)
BUN: 8 mg/dL (ref 6–20)
CO2: 29 mmol/L (ref 22–32)
CREATININE: 0.46 mg/dL (ref 0.44–1.00)
Calcium: 9.1 mg/dL (ref 8.9–10.3)
Chloride: 97 mmol/L — ABNORMAL LOW (ref 101–111)
GFR calc Af Amer: >60 mL/min (ref >60)
GLUCOSE: 90 mg/dL (ref 65–99)
Potassium: 3.1 mmol/L — ABNORMAL LOW (ref 3.5–5.1)
Sodium: 136 mmol/L (ref 135–145)
TOTAL PROTEIN: 8.1 g/dL (ref 6.5–8.1)

## 2015-05-28 LAB — LIPASE, BLOOD: LIPASE: 10 U/L — AB (ref 22–51)

## 2015-05-28 SURGERY — INCISION AND DRAINAGE, ABSCESS, PERIRECTAL
Anesthesia: General

## 2015-05-28 MED ORDER — IOHEXOL 300 MG/ML  SOLN
100.0000 mL | Freq: Once | INTRAMUSCULAR | Status: AC | PRN
  Administered 2015-05-28: 100 mL via INTRAVENOUS

## 2015-05-28 MED ORDER — LIDOCAINE HCL (CARDIAC) 20 MG/ML IV SOLN
INTRAVENOUS | Status: AC
  Filled 2015-05-28: qty 5

## 2015-05-28 MED ORDER — HYDROMORPHONE HCL 1 MG/ML IJ SOLN
1.0000 mg | Freq: Once | INTRAMUSCULAR | Status: AC
  Administered 2015-05-28: 1 mg via INTRAVENOUS
  Filled 2015-05-28: qty 1

## 2015-05-28 MED ORDER — FENTANYL CITRATE (PF) 250 MCG/5ML IJ SOLN
INTRAMUSCULAR | Status: AC
  Filled 2015-05-28: qty 25

## 2015-05-28 MED ORDER — MIDAZOLAM HCL 5 MG/5ML IJ SOLN
INTRAMUSCULAR | Status: DC | PRN
  Administered 2015-05-28: 2 mg via INTRAVENOUS

## 2015-05-28 MED ORDER — LACTATED RINGERS IV SOLN
INTRAVENOUS | Status: DC | PRN
  Administered 2015-05-28: via INTRAVENOUS

## 2015-05-28 MED ORDER — PIPERACILLIN-TAZOBACTAM 3.375 G IVPB 30 MIN
3.3750 g | Freq: Once | INTRAVENOUS | Status: AC
  Administered 2015-05-28: 3.375 g via INTRAVENOUS
  Filled 2015-05-28: qty 50

## 2015-05-28 MED ORDER — KCL IN DEXTROSE-NACL 20-5-0.45 MEQ/L-%-% IV SOLN
INTRAVENOUS | Status: DC
  Administered 2015-05-28: 75 mL/h via INTRAVENOUS
  Filled 2015-05-28: qty 1000

## 2015-05-28 MED ORDER — MIDAZOLAM HCL 2 MG/2ML IJ SOLN
INTRAMUSCULAR | Status: AC
  Filled 2015-05-28: qty 4

## 2015-05-28 MED ORDER — ONDANSETRON HCL 4 MG/2ML IJ SOLN
INTRAMUSCULAR | Status: AC
  Filled 2015-05-28: qty 2

## 2015-05-28 MED ORDER — PROPOFOL 10 MG/ML IV BOLUS
INTRAVENOUS | Status: AC
  Filled 2015-05-28: qty 20

## 2015-05-28 MED ORDER — SODIUM CHLORIDE 0.9 % IV BOLUS (SEPSIS)
1000.0000 mL | Freq: Once | INTRAVENOUS | Status: AC
  Administered 2015-05-28: 1000 mL via INTRAVENOUS

## 2015-05-28 SURGICAL SUPPLY — 30 items
BLADE HEX COATED 2.75 (ELECTRODE) ×3 IMPLANT
BLADE SURG 15 STRL LF DISP TIS (BLADE) ×1 IMPLANT
BLADE SURG 15 STRL SS (BLADE) ×3
COVER SURGICAL LIGHT HANDLE (MISCELLANEOUS) ×6 IMPLANT
DRSG PAD ABDOMINAL 8X10 ST (GAUZE/BANDAGES/DRESSINGS) IMPLANT
ELECT PENCIL ROCKER SW 15FT (MISCELLANEOUS) ×3 IMPLANT
ELECT REM PT RETURN 9FT ADLT (ELECTROSURGICAL) ×3
ELECTRODE REM PT RTRN 9FT ADLT (ELECTROSURGICAL) ×1 IMPLANT
GAUZE SPONGE 4X4 12PLY STRL (GAUZE/BANDAGES/DRESSINGS) ×3 IMPLANT
GAUZE SPONGE 4X4 16PLY XRAY LF (GAUZE/BANDAGES/DRESSINGS) ×3 IMPLANT
GLOVE BIOGEL PI IND STRL 7.0 (GLOVE) ×1 IMPLANT
GLOVE BIOGEL PI INDICATOR 7.0 (GLOVE) ×2
GLOVE SURG ORTHO 8.0 STRL STRW (GLOVE) ×3 IMPLANT
GOWN STRL REUS W/TWL LRG LVL3 (GOWN DISPOSABLE) ×3 IMPLANT
GOWN STRL REUS W/TWL XL LVL3 (GOWN DISPOSABLE) ×6 IMPLANT
KIT BASIN OR (CUSTOM PROCEDURE TRAY) ×3 IMPLANT
LEGGING LITHOTOMY PAIR STRL (DRAPES) ×2 IMPLANT
LUBRICANT JELLY K Y 4OZ (MISCELLANEOUS) ×2 IMPLANT
NDL HYPO 25X1 1.5 SAFETY (NEEDLE) IMPLANT
NEEDLE HYPO 25X1 1.5 SAFETY (NEEDLE) IMPLANT
PACK LITHOTOMY IV (CUSTOM PROCEDURE TRAY) ×3 IMPLANT
PACKING VAGINAL (PACKING) ×2 IMPLANT
PAD ABD 8X10 STRL (GAUZE/BANDAGES/DRESSINGS) ×4 IMPLANT
SOL PREP PROV IODINE SCRUB 4OZ (MISCELLANEOUS) ×3 IMPLANT
SWAB COLLECTION DEVICE MRSA (MISCELLANEOUS) IMPLANT
SYR BULB IRRIGATION 50ML (SYRINGE) ×2 IMPLANT
SYR CONTROL 10ML LL (SYRINGE) IMPLANT
TOWEL OR 17X26 10 PK STRL BLUE (TOWEL DISPOSABLE) ×3 IMPLANT
UNDERPAD 30X30 INCONTINENT (UNDERPADS AND DIAPERS) ×3 IMPLANT
YANKAUER SUCT BULB TIP 10FT TU (MISCELLANEOUS) ×3 IMPLANT

## 2015-05-28 NOTE — ED Notes (Signed)
Patient transported to CT 

## 2015-05-28 NOTE — H&P (Signed)
Stephanie [name] is an 34 y.o. female.    General Surgery [facility]  Chief Complaint: perirectal abscesses, pelvic pain  HPI:  34 yo BF with one week history of pelvic pain.  Seen by primary MD and thought to have hemorrhoids.  Referral made to surgery for one month.  Patient developed worsening pain, fever, sweats and constipation.  Presents to ER.  WBC elevated at 18K.  CT scan shows large "horseshoe" type perirectal abscesses.  Surgery called for urgent management.  Previous C-section.  Schizophrenia, anxiety.  Past Medical History  Diagnosis Date  . H/O cesarean section 2010  . Vaginal delivery 2004 , 2008, 2009  . Diabetes mellitus     gestational  . Anemia   . Panic attacks   . Depression   . Schizophrenia   . Hemorrhoids     Past Surgical History  Procedure Laterality Date  . Tubal ligation  2010  . Cesarean section      Family History  Problem Relation Age of Onset  . Hypertension Other   . Hypertension Mother    Social History:  reports that she has been smoking Cigarettes.  She has been smoking about 0.50 packs per day. She does not have any smokeless tobacco history on file. She reports that she drinks alcohol. She reports that she uses illicit drugs (Marijuana) about 3 times per week.  Allergies: No Known Allergies   (Not in a hospital admission)  Results for orders placed or performed during the hospital encounter of 05/28/15 (from the past 48 hour(s))  CBC     Status: Abnormal   Collection Time: 05/28/15  5:47 PM  Result Value Ref Range   WBC 18.3 (H) 4.0 - 10.5 K/uL   RBC 3.56 (L) 3.87 - 5.11 MIL/uL   Hemoglobin 11.8 (L) 12.0 - 15.0 g/dL   HCT 34.7 (L) 36.0 - 46.0 %   MCV 97.5 78.0 - 100.0 fL   MCH 33.1 26.0 - 34.0 pg   MCHC 34.0 30.0 - 36.0 g/dL   RDW 12.5 11.5 - 15.5 %   Platelets 239 150 - 400 K/uL  Comprehensive metabolic panel     Status: Abnormal   Collection Time: 05/28/15  5:47 PM  Result Value Ref Range   Sodium  136 135 - 145 mmol/L   Potassium 3.1 (L) 3.5 - 5.1 mmol/L   Chloride 97 (L) 101 - 111 mmol/L   CO2 29 22 - 32 mmol/L   Glucose, Bld 90 65 - 99 mg/dL   BUN 8 6 - 20 mg/dL   Creatinine, Ser 0.46 0.44 - 1.00 mg/dL   Calcium 9.1 8.9 - 10.3 mg/dL   Total Protein 8.1 6.5 - 8.1 g/dL   Albumin 3.7 3.5 - 5.0 g/dL   AST 17 15 - 41 U/L   ALT 12 (L) 14 - 54 U/L   Alkaline Phosphatase 88 38 - 126 U/L   Total Bilirubin 0.6 0.3 - 1.2 mg/dL   GFR calc non Af Amer >60 >60 mL/min   GFR calc Af Amer >60 >60 mL/min    Comment: (NOTE) The eGFR has been calculated using the CKD EPI equation. This calculation has not been validated in all clinical situations. eGFR's persistently <60 mL/min signify possible Chronic Kidney Disease.    Anion gap 10 5 - 15  Lipase, blood     Status: Abnormal   Collection Time: 05/28/15  5:47 PM  Result Value Ref Range   Lipase 10 (L)  22 - 51 U/L   Ct Abdomen Pelvis W Contrast  05/28/2015   CLINICAL DATA:  Lower pelvic pain and pressure. Pelvic abnormalities on a noncontrast CT earlier today.  EXAM: CT ABDOMEN AND PELVIS WITH CONTRAST  TECHNIQUE: Multidetector CT imaging of the abdomen and pelvis was performed using the standard protocol following bolus administration of intravenous contrast.  CONTRAST:  183m OMNIPAQUE IOHEXOL 300 MG/ML  SOLN  FINDINGS: Diffuse medium to low density wall thickening of the rectum with severe luminal narrowing. The maximum wall thickness is 1.7 cm on image number 68. Again demonstrated is a perirectal fluid collection posteriorly and on both sides. This has peripheral rim enhancement and contains a tiny amount of gas. This fluid collection measures 6.5 x 4.9 cm on image number 70 for. There is also an oval left pelvic fluid collection with a thin peripheral enhancing rim measuring 5.9 x 3.9 cm on image number 66. This is in close proximity to the left ovary but not definitely arising from the ovary.  The sigmoid colon is mildly dilated and filled  with fluid to the level of marked rectal narrowing. The colon proximal to the sigmoid colon is mildly dilated and filled with gas.  Unremarkable liver, spleen, pancreas, gallbladder, adrenal glands, kidneys, ureters, urinary bladder, uterus and ovaries. No enlarged lymph nodes. Clear lung bases. Unremarkable bones.  IMPRESSION: 1. Marked proctitis with marked rectal luminal narrowing in partial: Obstruction. 2. 6.5 x 4.9 cm and 5.9 x 3.9 cm perirectal abscesses. The 6.5 cm abscess contains a small amount of gas.   Electronically Signed   By: SClaudie ReveringM.D.   On: 05/28/2015 20:13   Ct Renal Stone Study  05/28/2015   CLINICAL DATA:  34year old female with abdominal and left flank pain and nausea  EXAM: CT ABDOMEN AND PELVIS WITHOUT CONTRAST  TECHNIQUE: Multidetector CT imaging of the abdomen and pelvis was performed following the standard protocol without IV contrast.  COMPARISON:  None.  FINDINGS: Please note that parenchymal abnormalities may be missed without intravenous contrast.  Lower chest:  Unremarkable  Hepatobiliary: The liver and gallbladder are unremarkable. There is no evidence of biliary dilatation.  Pancreas: Unremarkable  Spleen: Unremarkable  Adrenals/Urinary Tract: The kidneys, adrenal glands and bladder are unremarkable. An indeterminate 1 mm calcification in the left anatomic pelvis (image 50) could represent a nonobstructing ureteral calculus but favor vascular.  Stomach/Bowel: There are is diffuse circumferential wall thickening of the rectum with apparent moderate fluid along the lateral and posterior aspect of the rectum. Mild gaseous distention of the colon is noted. No other definite bowel wall thickening is noted.  Vascular/Lymphatic: No enlarged lymph nodes or abdominal aortic aneurysm.  Reproductive: A 4 x 6.3 cm cyst in the posterior left pelvis appears to be separate from the left ovary. There is a hyperdense tubular structure extending from this cyst down to the tip of the coccyx.   Other: There may be a small amount of free fluid within the pelvis. There is no evidence of pneumoperitoneum.  Musculoskeletal: No acute or suspicious abnormalities are identified.  IMPRESSION: Diffuse circumferential wall thickening of the rectum with strong suggestion of surrounding fluid possibly representing infection with adjacent abscess but nonspecific. Further evaluation is recommended.  4 x 6.3 cm cyst in the posterior left pelvis with hyper dense tubular structure extending inferiorly down to the tip of the coccyx. This is nonspecific as well and may represent congenital or neural abnormality. Further evaluation is also recommended.  1 mm calcifications  in the left anatomic pelvis which could represent a nonobstructing ureteral calculus but slightly favor vascular.   Electronically Signed   By: Margarette Canada M.D.   On: 05/28/2015 18:14    Review of Systems  Constitutional: Positive for fever, chills and diaphoresis.  HENT: Negative.   Eyes: Negative.   Respiratory: Negative.   Cardiovascular: Negative.   Gastrointestinal: Positive for abdominal pain (pelvic pain) and constipation.  Genitourinary: Negative.   Musculoskeletal: Negative.   Skin: Negative.   Neurological: Negative.   Endo/Heme/Allergies: Negative.   Psychiatric/Behavioral: The patient is nervous/anxious.        Schizophrenia    Blood pressure 139/82, pulse 82, temperature 98.5 F (36.9 C), temperature source Oral, resp. rate 18, height _0  (1.676 m), weight 79.379 kg (175 lb), last menstrual period 05/19/2015, SpO2 98 %. Physical Exam  Constitutional: She is oriented to person, place, and time. She appears well-developed and well-nourished. She appears distressed.  HENT:  Head: Normocephalic and atraumatic.  Right Ear: External ear normal.  Left Ear: External ear normal.  Eyes: Conjunctivae are normal. Pupils are equal, round, and reactive to light. No scleral icterus.  Neck: Normal range of motion. Neck supple. No  tracheal deviation present. No thyromegaly present.  Cardiovascular: Regular rhythm and normal heart sounds.   No murmur heard. tachycardic  Respiratory: Effort normal and breath sounds normal. No respiratory distress. She has no wheezes.  GI: Soft. Bowel sounds are normal. She exhibits no distension and no mass. There is tenderness (lower abd wall). There is guarding.  Musculoskeletal: Normal range of motion. She exhibits no edema.  Neurological: She is alert and oriented to person, place, and time.  Skin: Skin is warm. She is diaphoretic.  Psychiatric: She has a normal mood and affect. Her behavior is normal.     Assessment/Plan  Large "horseshoe" perirectal abscesses  Admit to general surgery service  Zosyn started in ER  Plan urgent OR tonight for I&D, packing  The risks and benefits of the procedure have been discussed at length with the patient.  The patient understands the proposed procedure, potential alternative treatments, and the course of recovery to be expected.  All of the patient's questions have been answered at this time.  The patient wishes to proceed with surgery.  Earnstine Regal, MD, [facility] Office: [place] 05/28/2015, 9:39 PM

## 2015-05-28 NOTE — ED Notes (Addendum)
Pt arrived via EMS with report of generalized abd pain, hemorrhoidal pain, and lt flank pain. Abd soft/distended but tender to palpate. Pt reported nausea without vomiting and diarrhea. LBM 3 days ago. BS active x4 quadrants. Pt reported hematuria, pressure with voiding, denies urinary frequency/urgency and dysuria. Denies bright red rectal bleeding. No swelling noted to external hemorrhoids. Noted yellowish of sclera.

## 2015-05-28 NOTE — ED Provider Notes (Signed)
CSN: [id number]     Arrival date & time 05/28/15  1621 History   First MD Initiated Contact with Patient 05/28/15 1628     Chief Complaint  Patient presents with  . Abdominal Pain     (Consider location/radiation/quality/duration/timing/severity/associated sxs/prior Treatment) Patient is a 34 y.o. female presenting with abdominal pain. The history is provided by the patient.  Abdominal Pain Pain location:  L flank and R flank Pain quality: throbbing   Pain radiates to:  Does not radiate Pain severity:  Moderate Onset quality:  Gradual Timing:  Constant Progression:  Unchanged Chronicity:  New Context: not recent illness and not sick contacts   Relieved by:  Nothing Worsened by:  Nothing tried Associated symptoms: constipation (for 3 days) and vomiting   Associated symptoms: no cough, no diarrhea, no fever and no shortness of breath     Past Medical History  Diagnosis Date  . H/O cesarean section 2010  . Vaginal delivery 2004 , 2008, 2009  . Diabetes mellitus     gestational  . Anemia   . Panic attacks   . Depression   . Schizophrenia   . Hemorrhoids    Past Surgical History  Procedure Laterality Date  . Tubal ligation  2010  . Cesarean section     Family History  Problem Relation Age of Onset  . Hypertension Other   . Hypertension Mother    Social History  Substance Use Topics  . Smoking status: Current Every Day Smoker -- 0.50 packs/day    Types: Cigarettes  . Smokeless tobacco: None  . Alcohol Use: Yes     Comment: ocassionally   OB History    Gravida Para Term Preterm AB TAB SAB Ectopic Multiple Living   Review of Systems  Constitutional: Negative for fever.  Respiratory: Negative for cough and shortness of breath.   Gastrointestinal: Positive for vomiting, abdominal pain and constipation (for 3 days). Negative for diarrhea.  All other systems reviewed and are negative.     Allergies  Review of patient's allergies indicates  no known allergies.  Home Medications   Prior to Admission medications   Medication Sig Start Date End Date Taking? Authorizing Provider  Aspirin-Acetaminophen-Caffeine (GOODY HEADACHE PO) Take 1 Package by mouth daily as needed (fr pain).    Historical Provider, MD  hydrocortisone (ANUSOL-HC) 2.5 % rectal cream Place 1 application rectally 2 (two) times daily.  04/03/13   Jennifer Piepenbrink, PA-C  hydrocortisone (ANUSOL-HC) 25 MG suppository Place 1 suppository (25 mg total) rectally 2 (two) times daily. For 7 days 01/25/15   Felicie Morn, NP  lidocaine (XYLOCAINE JELLY) 2 % jelly Apply 1 application topically 2 (two) times daily as needed. Patient not taking: Reported on 01/25/2015 11/18/13   Ivonne Andrew, PA-C  lidocaine (XYLOCAINE) 2 % jelly Apply 1 application topically 2 (two) times daily as needed. 01/25/15   Jennifer Piepenbrink, PA-C  phenylephrine-shark liver oil-mineral oil-petrolatum (PREPARATION H) 0.25-3-14-71.9 % rectal ointment Place 1 application rectally 2 (two) times daily as needed for hemorrhoids (hemorrhoids). pain    Historical Provider, MD  polyethylene glycol powder (GLYCOLAX/MIRALAX) powder Take 17 g by mouth daily. Patient not taking: Reported on 01/25/2015 11/18/13   Ivonne Andrew, PA-C  polyethylene glycol powder (GLYCOLAX/MIRALAX) powder Take 17 g by mouth 2 (two) times daily. Until daily soft stools  OTC 01/25/15   Jennifer Piepenbrink, PA-C   BP 136/91 mmHg  Pulse 85  Temp(Src) 98.6 F (37 C) (Oral)  Resp 18  Ht 5\' 6"  (1.676 m)  Wt 175 lb (79.379 kg)  BMI 28.26 kg/m2  SpO2 98%  LMP 05/19/2015 Physical Exam  Constitutional: She is oriented to person, place, and time. She appears well-developed and well-nourished. No distress.  HENT:  Head: Normocephalic and atraumatic.  Mouth/Throat: Oropharynx is clear and moist.  Eyes: EOM are normal. Pupils are equal, round, and reactive to light.  Neck: Normal range of motion. Neck supple.  Cardiovascular: Normal rate and  regular rhythm.  Exam reveals no friction rub.   No murmur heard. Pulmonary/Chest: Effort normal and breath sounds normal. No respiratory distress. She has no wheezes. She has no rales.  Abdominal: Soft. She exhibits no distension. There is no tenderness. There is no rebound.  Musculoskeletal: Normal range of motion. She exhibits no edema.  Neurological: She is alert and oriented to person, place, and time.  Skin: No rash noted. She is not diaphoretic.  Nursing note and vitals reviewed.   ED Course  Procedures (including critical care time) Labs Review Labs Reviewed - No data to display  Imaging Review Ct Abdomen Pelvis W Contrast  05/28/2015   CLINICAL DATA:  Lower pelvic pain and pressure. Pelvic abnormalities on a noncontrast CT earlier today.  EXAM: CT ABDOMEN AND PELVIS WITH CONTRAST  TECHNIQUE: Multidetector CT imaging of the abdomen and pelvis was performed using the standard protocol following bolus administration of intravenous contrast.  CONTRAST:  OMNIPAQUE IOHEXOL 300 MG/ML  SOLN  FINDINGS: Diffuse medium to low density wall thickening of the rectum with severe luminal narrowing. The maximum wall thickness is 1.7 cm on image number 68. Again demonstrated is a perirectal fluid collection posteriorly and on both sides. This has peripheral rim enhancement and contains a tiny amount of gas. This fluid collection measures 6.5 x 4.9 cm on image number 70 for. There is also an oval left pelvic fluid collection with a thin peripheral enhancing rim measuring 5.9 x 3.9 cm on image number 66. This is in close proximity to the left ovary but not definitely arising from the ovary.  The sigmoid colon is mildly dilated and filled with fluid to the level of marked rectal narrowing. The colon proximal to the sigmoid colon is mildly dilated and filled with gas.  Unremarkable liver, spleen, pancreas, gallbladder, adrenal glands, kidneys, ureters, urinary bladder, uterus and ovaries. No enlarged lymph  nodes. Clear lung bases. Unremarkable bones.  IMPRESSION: 1. Marked proctitis with marked rectal luminal narrowing in partial: Obstruction. 2. 6.5 x 4.9 cm and 5.9 x 3.9 cm perirectal abscesses. The 6.5 cm abscess contains a small amount of gas.   Electronically Signed   By: Beckie Salts M.D.   On: 05/28/2015 20:13   Ct Renal Stone Study  05/28/2015   CLINICAL DATA:  34 year old female with abdominal and left flank pain and nausea  EXAM: CT ABDOMEN AND PELVIS WITHOUT CONTRAST  TECHNIQUE: Multidetector CT imaging of the abdomen and pelvis was performed following the standard protocol without IV contrast.  COMPARISON:  None.  FINDINGS: Please note that parenchymal abnormalities may be missed without intravenous contrast.  Lower chest:  Unremarkable  Hepatobiliary: The liver and gallbladder are unremarkable. There is no evidence of biliary dilatation.  Pancreas: Unremarkable  Spleen: Unremarkable  Adrenals/Urinary Tract: The kidneys, adrenal glands and bladder are unremarkable. An indeterminate 1 mm calcification in the left anatomic pelvis (image 50) could represent a nonobstructing ureteral calculus but favor vascular.  Stomach/Bowel:  There are is diffuse circumferential wall thickening of the rectum with apparent moderate fluid along the lateral and posterior aspect of the rectum. Mild gaseous distention of the colon is noted. No other definite bowel wall thickening is noted.  Vascular/Lymphatic: No enlarged lymph nodes or abdominal aortic aneurysm.  Reproductive: A 4 x 6.3 cm cyst in the posterior left pelvis appears to be separate from the left ovary. There is a hyperdense tubular structure extending from this cyst down to the tip of the coccyx.  Other: There may be a small amount of free fluid within the pelvis. There is no evidence of pneumoperitoneum.  Musculoskeletal: No acute or suspicious abnormalities are identified.  IMPRESSION: Diffuse circumferential wall thickening of the rectum with strong suggestion  of surrounding fluid possibly representing infection with adjacent abscess but nonspecific. Further evaluation is recommended.  4 x 6.3 cm cyst in the posterior left pelvis with hyper dense tubular structure extending inferiorly down to the tip of the coccyx. This is nonspecific as well and may represent congenital or neural abnormality. Further evaluation is also recommended.  1 mm calcifications in the left anatomic pelvis which could represent a nonobstructing ureteral calculus but slightly favor vascular.   Electronically Signed   By: Harmon Pier M.D.   On: 05/28/2015 18:14   I have personally reviewed and evaluated these images and lab results as part of my medical decision-making.   EKG Interpretation None      MDM   Final diagnoses:  Hematuria  Perirectal abscess    34 year old female here with constipation, throbbing flank pain for the past 3 days. Associated hematuria. Has had some nausea but no vomiting or fever. She also is complaining of rectal pressure. On exam she has some small external, nonthrombosed hemorrhoids. We'll scan for possible kidney stone.  Scan showed no kidney stone, but colonic thickening with possible abscess. Rescanned with contrast and she's got 2 perirectal abscesses. Dr. Gerrit Friends with Surgery consulted. Zosyn given.  Elwin Mocha, MD 05/28/15 [phone]

## 2015-05-28 NOTE — ED Notes (Signed)
Pt tried again for UA.  "I can't pee"

## 2015-05-28 NOTE — Anesthesia Preprocedure Evaluation (Addendum)
Anesthesia Evaluation  Patient identified by MRN, date of birth, ID band Patient awake    Reviewed: Allergy & Precautions, NPO status , Patient's Chart, lab work & pertinent test results  Airway Mallampati: II  TM Distance: >3 FB Neck ROM: Full    Dental  (+) Teeth Intact   Pulmonary Current Smoker,  breath sounds clear to auscultation        Cardiovascular negative cardio ROS  Rhythm:Regular Rate:Normal     Neuro/Psych PSYCHIATRIC DISORDERS Anxiety Depression Schizophrenia    GI/Hepatic   Endo/Other  diabetes  Renal/GU   negative genitourinary   Musculoskeletal negative musculoskeletal ROS (+)   Abdominal   Peds negative pediatric ROS (+)  Hematology negative hematology ROS (+)   Anesthesia Other Findings   Reproductive/Obstetrics negative OB ROS                          Lab Results  Component Value Date   WBC 18.3* 05/28/2015   HGB 11.8* 05/28/2015   HCT 34.7* 05/28/2015   MCV 97.5 05/28/2015   PLT 239 05/28/2015   Lab Results  Component Value Date   CREATININE 0.46 05/28/2015   BUN 8 05/28/2015   NA 136 05/28/2015   K 3.1* 05/28/2015   CL 97* 05/28/2015   CO2 29 05/28/2015   No results found for: INR, PROTIME   Anesthesia Physical Anesthesia Plan  ASA: II  Anesthesia Plan: General   Post-op Pain Management:    Induction: Intravenous  Airway Management Planned: Oral ETT  Additional Equipment:   Intra-op Plan:   Post-operative Plan: Extubation in OR  Informed Consent: I have reviewed the patients History and Physical, chart, labs and discussed the procedure including the risks, benefits and alternatives for the proposed anesthesia with the patient or authorized representative who has indicated his/her understanding and acceptance.   Dental advisory given  Plan Discussed with: CRNA  Anesthesia Plan Comments:         Anesthesia Quick Evaluation

## 2015-05-28 NOTE — ED Notes (Signed)
Surgeon explained to pt that she is unable to urinate because of her condition.

## 2015-05-28 NOTE — ED Notes (Signed)
Bed: WA06 Expected date:  Expected time:  Means of arrival:  Comments: EMS- abd/flank pain

## 2015-05-29 DIAGNOSIS — K644 Residual hemorrhoidal skin tags: Secondary | ICD-10-CM | POA: Diagnosis present

## 2015-05-29 DIAGNOSIS — Z8632 Personal history of gestational diabetes: Secondary | ICD-10-CM | POA: Diagnosis not present

## 2015-05-29 DIAGNOSIS — N739 Female pelvic inflammatory disease, unspecified: Secondary | ICD-10-CM | POA: Diagnosis present

## 2015-05-29 DIAGNOSIS — F17210 Nicotine dependence, cigarettes, uncomplicated: Secondary | ICD-10-CM | POA: Diagnosis present

## 2015-05-29 DIAGNOSIS — K611 Rectal abscess: Secondary | ICD-10-CM | POA: Diagnosis present

## 2015-05-29 DIAGNOSIS — K5900 Constipation, unspecified: Secondary | ICD-10-CM | POA: Diagnosis present

## 2015-05-29 DIAGNOSIS — Z8249 Family history of ischemic heart disease and other diseases of the circulatory system: Secondary | ICD-10-CM | POA: Diagnosis not present

## 2015-05-29 DIAGNOSIS — R319 Hematuria, unspecified: Secondary | ICD-10-CM | POA: Diagnosis present

## 2015-05-29 DIAGNOSIS — D72829 Elevated white blood cell count, unspecified: Secondary | ICD-10-CM | POA: Diagnosis present

## 2015-05-29 DIAGNOSIS — F418 Other specified anxiety disorders: Secondary | ICD-10-CM | POA: Diagnosis present

## 2015-05-29 DIAGNOSIS — F209 Schizophrenia, unspecified: Secondary | ICD-10-CM | POA: Diagnosis present

## 2015-05-29 LAB — SURGICAL PCR SCREEN
MRSA, PCR: NEGATIVE
Staphylococcus aureus: POSITIVE — AB

## 2015-05-29 MED ORDER — NEOSTIGMINE METHYLSULFATE 10 MG/10ML IV SOLN
INTRAVENOUS | Status: AC
  Filled 2015-05-29: qty 1

## 2015-05-29 MED ORDER — ACETAMINOPHEN 325 MG PO TABS: 650.0000 mg | ORAL_TABLET | Freq: Four times a day (QID) | ORAL | Status: DC | PRN

## 2015-05-29 MED ORDER — PROMETHAZINE HCL 25 MG/ML IJ SOLN: 6.2500 mg | INTRAMUSCULAR | Status: DC | PRN

## 2015-05-29 MED ORDER — ROCURONIUM BROMIDE 100 MG/10ML IV SOLN
INTRAVENOUS | Status: DC | PRN
  Administered 2015-05-29: 10 mg via INTRAVENOUS

## 2015-05-29 MED ORDER — GLYCOPYRROLATE 0.2 MG/ML IJ SOLN
INTRAMUSCULAR | Status: AC
  Filled 2015-05-29: qty 3

## 2015-05-29 MED ORDER — NEOSTIGMINE METHYLSULFATE 10 MG/10ML IV SOLN
INTRAVENOUS | Status: DC | PRN
  Administered 2015-05-29: 4 mg via INTRAVENOUS

## 2015-05-29 MED ORDER — LIDOCAINE HCL (CARDIAC) 20 MG/ML IV SOLN
INTRAVENOUS | Status: DC | PRN
  Administered 2015-05-29: 80 mg via INTRAVENOUS

## 2015-05-29 MED ORDER — SUCCINYLCHOLINE CHLORIDE 20 MG/ML IJ SOLN
INTRAMUSCULAR | Status: DC | PRN
  Administered 2015-05-29: 100 mg via INTRAVENOUS

## 2015-05-29 MED ORDER — PROPOFOL 10 MG/ML IV BOLUS
INTRAVENOUS | Status: DC | PRN
  Administered 2015-05-29: 150 mg via INTRAVENOUS
  Administered 2015-05-29: 30 mg via INTRAVENOUS

## 2015-05-29 MED ORDER — PIPERACILLIN-TAZOBACTAM 3.375 G IVPB
3.3750 g | Freq: Three times a day (TID) | INTRAVENOUS | Status: DC
  Administered 2015-05-29 – 2015-06-04 (×19): 3.375 g via INTRAVENOUS
  Filled 2015-05-29 (×19): qty 50

## 2015-05-29 MED ORDER — KCL IN DEXTROSE-NACL 20-5-0.45 MEQ/L-%-% IV SOLN
INTRAVENOUS | Status: DC
  Administered 2015-05-29 – 2015-06-02 (×4): via INTRAVENOUS
  Filled 2015-05-29 (×7): qty 1000

## 2015-05-29 MED ORDER — FENTANYL CITRATE (PF) 100 MCG/2ML IJ SOLN
INTRAMUSCULAR | Status: DC | PRN
Start: 2015-05-28 — End: 2015-05-29
  Administered 2015-05-28 – 2015-05-29 (×3): 50 ug via INTRAVENOUS

## 2015-05-29 MED ORDER — ACETAMINOPHEN 650 MG RE SUPP: 650.0000 mg | Freq: Four times a day (QID) | RECTAL | Status: DC | PRN

## 2015-05-29 MED ORDER — GLYCOPYRROLATE 0.2 MG/ML IJ SOLN
INTRAMUSCULAR | Status: DC | PRN
  Administered 2015-05-29: .6 mg via INTRAVENOUS

## 2015-05-29 MED ORDER — HYDROMORPHONE HCL 1 MG/ML IJ SOLN
INTRAMUSCULAR | Status: AC
  Filled 2015-05-29: qty 1

## 2015-05-29 MED ORDER — HYDROMORPHONE HCL 1 MG/ML IJ SOLN
1.0000 mg | INTRAMUSCULAR | Status: DC | PRN
  Administered 2015-05-29 – 2015-06-02 (×30): 1 mg via INTRAVENOUS
  Filled 2015-05-29 (×31): qty 1

## 2015-05-29 MED ORDER — ONDANSETRON HCL 4 MG/2ML IJ SOLN
4.0000 mg | Freq: Four times a day (QID) | INTRAMUSCULAR | Status: DC | PRN
  Administered 2015-06-01: 4 mg via INTRAVENOUS
  Filled 2015-05-29: qty 2

## 2015-05-29 MED ORDER — 0.9 % SODIUM CHLORIDE (POUR BTL) OPTIME
TOPICAL | Status: DC | PRN
  Administered 2015-05-29: 1000 mL

## 2015-05-29 MED ORDER — MEPERIDINE HCL 50 MG/ML IJ SOLN: 6.2500 mg | INTRAMUSCULAR | Status: DC | PRN

## 2015-05-29 MED ORDER — HYDROMORPHONE HCL 1 MG/ML IJ SOLN
0.2500 mg | INTRAMUSCULAR | Status: DC | PRN
  Administered 2015-05-29 (×2): 0.5 mg via INTRAVENOUS

## 2015-05-29 MED ORDER — ONDANSETRON HCL 4 MG/2ML IJ SOLN
INTRAMUSCULAR | Status: DC | PRN
  Administered 2015-05-29: 4 mg via INTRAVENOUS

## 2015-05-29 MED ORDER — ONDANSETRON 4 MG PO TBDP: 4.0000 mg | ORAL_TABLET | Freq: Four times a day (QID) | ORAL | Status: DC | PRN

## 2015-05-29 MED ORDER — LACTATED RINGERS IV SOLN: INTRAVENOUS | Status: DC

## 2015-05-29 MED ORDER — HYDROCODONE-ACETAMINOPHEN 5-325 MG PO TABS
1.0000 | ORAL_TABLET | ORAL | Status: DC | PRN
  Administered 2015-05-30 – 2015-05-31 (×5): 2 via ORAL
  Filled 2015-05-29 (×6): qty 2

## 2015-05-29 NOTE — Brief Op Note (Signed)
05/28/2015 - 05/29/2015  12:44 AM  PATIENT:  Stephanie [name]  34 y.o. female  PRE-OPERATIVE DIAGNOSIS:  Perirectal Abscess  POST-OPERATIVE DIAGNOSIS:  perirectal abscess  PROCEDURE:  Procedure(s): IRRIGATION AND DEBRIDEMENT PERIRECTAL ABSCESS (N/A)  SURGEON:  Surgeon(s) and Role:    * Darnell Level, MD - Primary  ANESTHESIA:   general  EBL:  Total I/O In: 2000 [I.V.:1000; IV Piggyback:1000] Out: -   BLOOD ADMINISTERED:none  DRAINS: none   LOCAL MEDICATIONS USED:  NONE  SPECIMEN:  Source of Specimen:  cultures from abscess  DISPOSITION OF SPECIMEN:  PATHOLOGY  COUNTS:  YES  TOURNIQUET:  * No tourniquets in log *  DICTATION: .Other Dictation: Dictation Number [id number]  PLAN OF CARE: Admit for overnight observation  PATIENT DISPOSITION:  PACU - hemodynamically stable.   Delay start of Pharmacological VTE agent (>24hrs) due to surgical blood loss or risk of bleeding: yes  Velora Heckler, MD, [facility] Office: [phone]

## 2015-05-29 NOTE — Anesthesia Procedure Notes (Signed)
Procedure Name: Intubation Date/Time: 05/29/2015 12:03 AM Performed by: Jarvis Newcomer A Pre-anesthesia Checklist: Patient identified, Timeout performed, Emergency Drugs available, Suction available and Patient being monitored Patient Re-evaluated:Patient Re-evaluated prior to inductionOxygen Delivery Method: Circle system utilized Preoxygenation: Pre-oxygenation with 100% oxygen Laryngoscope Size: Mac and 3 Grade View: Grade I Tube type: Oral Tube size: 7.5 mm Number of attempts: 1 Airway Equipment and Method: Stylet Placement Confirmation: breath sounds checked- equal and bilateral,  ETT inserted through vocal cords under direct vision and positive ETCO2 Secured at: 21 cm Tube secured with: Tape Dental Injury: Teeth and Oropharynx as per pre-operative assessment  Comments: RSI with cricoid pressure by Dr. Hart Rochester.

## 2015-05-29 NOTE — Progress Notes (Signed)
Patient ID: Stephanie [name], female   DOB: 1981/08/04, 34 y.o.   MRN: [id number]  General Surgery [facility]  POD#: 1  Subjective: Patient much more comfortable, talking on the phone.  Ate all of clear liquid tray this AM.  Objective: Vital signs in last 24 hours: Temp:  [97.9 F (36.6 C)-98.6 F (37 C)] 97.9 F (36.6 C) (09/04 0623) Pulse Rate:  [67-95] 95 (09/04 0623) Resp:  [15-19] 16 (09/04 0623) BP: (112-139)/(68-91) 112/77 mmHg (09/04 0623) SpO2:  [98 %-100 %] 98 % (09/04 0623) FiO2 (%):  [2 %] 2 % (09/04 0623) Weight:  [79.379 kg (175 lb)] 79.379 kg (175 lb) (09/03 1644) Last BM Date: 05/25/15  Intake/Output from previous day: 09/03 0701 - 09/04 0700 In: 2000 [I.V.:1000; IV Piggyback:1000] Out: 25 [Blood:25] Intake/Output this shift:    Physical Exam: HEENT - sclerae clear, mucous membranes moist Perineum - dressing dry and intact  Lab Results:   Recent Labs  05/28/15 1747  WBC 18.3*  HGB 11.8*  HCT 34.7*  PLT 239   BMET  Recent Labs  05/28/15 1747  NA 136  K 3.1*  CL 97*  CO2 29  GLUCOSE 90  BUN 8  CREATININE 0.46  CALCIUM 9.1   PT/INR No results for input(s): LABPROT, INR in the last 72 hours. Comprehensive Metabolic Panel:    Component Value Date/Time   NA 136 05/28/2015 1747   NA 140 04/28/2012 1812   K 3.1* 05/28/2015 1747   K 3.5 04/28/2012 1812   CL 97* 05/28/2015 1747   CL 105 04/28/2012 1812   CO2 29 05/28/2015 1747   CO2 25 09/16/2011 1348   BUN 8 05/28/2015 1747   BUN 8 04/28/2012 1812   CREATININE 0.46 05/28/2015 1747   CREATININE 0.50 04/28/2012 1812   GLUCOSE 90 05/28/2015 1747   GLUCOSE 86 04/28/2012 1812   CALCIUM 9.1 05/28/2015 1747   CALCIUM 9.5 09/16/2011 1348   AST 17 05/28/2015 1747   AST 13 12/04/2009 0750   ALT 12* 05/28/2015 1747   ALT 12 12/04/2009 0750   ALKPHOS 88 05/28/2015 1747   ALKPHOS 51 12/04/2009 0750   BILITOT 0.6 05/28/2015 1747   BILITOT 0.6 12/04/2009 0750   PROT  8.1 05/28/2015 1747   PROT 7.3 12/04/2009 0750   ALBUMIN 3.7 05/28/2015 1747   ALBUMIN 3.4* 12/04/2009 0750    Studies/Results: Ct Abdomen Pelvis W Contrast  05/28/2015   CLINICAL DATA:  Lower pelvic pain and pressure. Pelvic abnormalities on a noncontrast CT earlier today.  EXAM: CT ABDOMEN AND PELVIS WITH CONTRAST  TECHNIQUE: Multidetector CT imaging of the abdomen and pelvis was performed using the standard protocol following bolus administration of intravenous contrast.  CONTRAST:  OMNIPAQUE IOHEXOL 300 MG/ML  SOLN  FINDINGS: Diffuse medium to low density wall thickening of the rectum with severe luminal narrowing. The maximum wall thickness is 1.7 cm on image number 68. Again demonstrated is a perirectal fluid collection posteriorly and on both sides. This has peripheral rim enhancement and contains a tiny amount of gas. This fluid collection measures 6.5 x 4.9 cm on image number 70 for. There is also an oval left pelvic fluid collection with a thin peripheral enhancing rim measuring 5.9 x 3.9 cm on image number 66. This is in close proximity to the left ovary but not definitely arising from the ovary.  The sigmoid colon is mildly dilated and filled with fluid to the level of marked rectal narrowing. The  colon proximal to the sigmoid colon is mildly dilated and filled with gas.  Unremarkable liver, spleen, pancreas, gallbladder, adrenal glands, kidneys, ureters, urinary bladder, uterus and ovaries. No enlarged lymph nodes. Clear lung bases. Unremarkable bones.  IMPRESSION: 1. Marked proctitis with marked rectal luminal narrowing in partial: Obstruction. 2. 6.5 x 4.9 cm and 5.9 x 3.9 cm perirectal abscesses. The 6.5 cm abscess contains a small amount of gas.   Electronically Signed   By: Beckie Salts M.D.   On: 05/28/2015 20:13   Ct Renal Stone Study  05/28/2015   CLINICAL DATA:  34 year old female with abdominal and left flank pain and nausea  EXAM: CT ABDOMEN AND PELVIS WITHOUT CONTRAST   TECHNIQUE: Multidetector CT imaging of the abdomen and pelvis was performed following the standard protocol without IV contrast.  COMPARISON:  None.  FINDINGS: Please note that parenchymal abnormalities may be missed without intravenous contrast.  Lower chest:  Unremarkable  Hepatobiliary: The liver and gallbladder are unremarkable. There is no evidence of biliary dilatation.  Pancreas: Unremarkable  Spleen: Unremarkable  Adrenals/Urinary Tract: The kidneys, adrenal glands and bladder are unremarkable. An indeterminate 1 mm calcification in the left anatomic pelvis (image 50) could represent a nonobstructing ureteral calculus but favor vascular.  Stomach/Bowel: There are is diffuse circumferential wall thickening of the rectum with apparent moderate fluid along the lateral and posterior aspect of the rectum. Mild gaseous distention of the colon is noted. No other definite bowel wall thickening is noted.  Vascular/Lymphatic: No enlarged lymph nodes or abdominal aortic aneurysm.  Reproductive: A 4 x 6.3 cm cyst in the posterior left pelvis appears to be separate from the left ovary. There is a hyperdense tubular structure extending from this cyst down to the tip of the coccyx.  Other: There may be a small amount of free fluid within the pelvis. There is no evidence of pneumoperitoneum.  Musculoskeletal: No acute or suspicious abnormalities are identified.  IMPRESSION: Diffuse circumferential wall thickening of the rectum with strong suggestion of surrounding fluid possibly representing infection with adjacent abscess but nonspecific. Further evaluation is recommended.  4 x 6.3 cm cyst in the posterior left pelvis with hyper dense tubular structure extending inferiorly down to the tip of the coccyx. This is nonspecific as well and may represent congenital or neural abnormality. Further evaluation is also recommended.  1 mm calcifications in the left anatomic pelvis which could represent a nonobstructing ureteral  calculus but slightly favor vascular.   Electronically Signed   By: Harmon Pier M.D.   On: 05/28/2015 18:14    Anti-infectives: Anti-infectives    Start     Dose/Rate Route Frequency Ordered Stop   05/29/15 0600  piperacillin-tazobactam (ZOSYN) IVPB 3.375 g     3.375 g 12.5 mL/hr over 240 Minutes Intravenous Every 8 hours 05/29/15 0211     05/28/15 2030  piperacillin-tazobactam (ZOSYN) IVPB 3.375 g     3.375 g 100 mL/hr over 30 Minutes Intravenous  Once 05/28/15 2029 05/28/15 2206      Assessment & Plans: Horseshoe perirectal abscess  Advance to regular diet  Continue IV Zosyn for now  Will leave packing in place today, Sitz bath and remove in AM 9/5  Home 1-2 days  Check CBC in AM 9/5  Velora Heckler, MD, [facility] Office: [phone]   Stephanie [name] Judie Petit 05/29/2015

## 2015-05-29 NOTE — Op Note (Signed)
NAMEBABYGIRL, TRAGER NO.:  [id number]  MEDICAL RECORD NO.:  [id number]  LOCATION:  1343                         FACILITY:  [facility]  PHYSICIAN:  Velora Heckler, MD      DATE OF BIRTH:  07-Aug-1981  DATE OF PROCEDURE:  05/29/2015                              OPERATIVE REPORT   PREOPERATIVE DIAGNOSIS:  Horseshoe perirectal abscess.  POSTOPERATIVE DIAGNOSIS:  Horseshoe perirectal abscess.  PROCEDURE:  Incision, drainage, and open packing of horseshoe perirectal abscess.  SURGEON:  Velora Heckler, MD, FACS  ANESTHESIA:  General.  ESTIMATED BLOOD LOSS:  Minimal.  PREPARATION:  Betadine.  COMPLICATIONS:  None.  INDICATIONS:  The patient is a 34 year old black female who presents to the emergency department with a 1-week history of pelvic pain.  White blood cell count was elevated at 18,000.  CT scan revealed an extensive perirectal abscess involving both the right and left sides of the rectum and extending posteriorly into the low pelvis.  The patient is now brought to the operating room for incision and drainage.  BODY OF REPORT:  Procedure was done in OR #1 at the [facility].  The patient was brought to the operating room, placed in supine position on the operating room table.  Following administration of general anesthesia, the patient was placed in lithotomy and prepped and draped in the usual aseptic fashion.  After ascertaining that an adequate level of anesthesia had been achieved, an ellipse of skin was excised from the posterior midline posterior to the rectum.  Dissection was carried into the subcutaneous tissues bluntly. Using a hemostat, the abscess cavity was entered and a large amount of foul-smelling purulent fluid was evacuated.  Using gentle digital dissection, the abscess cavities on both the right and left sides of the rectum are opened and loculations were broken down with blunt dissection.  Both cavities are irrigated  with warm saline.  Abscess cavities were finally packed with 2-inch vaginal packing saturated with Betadine.  Dry gauze dressings and ABD pads were placed on the perineum.  Hemostasis was obtained around the skin edges with the electrocautery.  The patient was taken out of lithotomy, awakened from anesthesia, and brought to the recovery room. The patient tolerated the procedure well.   Velora Heckler, MD, [facility] Office: [phone]    TMG/MEDQ  D:  05/29/2015  T:  05/29/2015  Job:  [id number]  cc:   Velora Heckler, M.D.'s Office

## 2015-05-29 NOTE — Transfer of Care (Signed)
Immediate Anesthesia Transfer of Care Note  Patient: Stephanie [name]  Procedure(s) Performed: Procedure(s): IRRIGATION AND DEBRIDEMENT PERIRECTAL ABSCESS (N/A)  Patient Location: PACU  Anesthesia Type:General  Level of Consciousness: awake, alert , oriented and patient cooperative  Airway & Oxygen Therapy: Patient Spontanous Breathing and Patient connected to face mask oxygen  Post-op Assessment: Report given to RN, Post -op Vital signs reviewed and stable and Patient moving all extremities  Post vital signs: Reviewed and stable  Last Vitals:  Filed Vitals:   05/28/15 2241  BP: 116/72  Pulse: 78  Temp:   Resp: 16    Complications: No apparent anesthesia complications

## 2015-05-29 NOTE — Anesthesia Postprocedure Evaluation (Signed)
  Anesthesia Post-op Note  Patient: Stephanie [name]  Procedure(s) Performed: Procedure(s): IRRIGATION AND DEBRIDEMENT PERIRECTAL ABSCESS (N/A)  Patient Location: PACU  Anesthesia Type:General  Level of Consciousness: awake, alert  and oriented  Airway and Oxygen Therapy: Patient Spontanous Breathing and Patient connected to nasal cannula oxygen  Post-op Pain: mild  Post-op Assessment: Post-op Vital signs reviewed and Patient's Cardiovascular Status Stable              Post-op Vital Signs: Reviewed and stable  Last Vitals:  Filed Vitals:   05/29/15 0153  BP: 122/68  Pulse: 69  Temp: 36.9 C  Resp: 18    Complications: No apparent anesthesia complications

## 2015-05-30 LAB — CBC
HEMATOCRIT: 31 % — AB (ref 36.0–46.0)
Hemoglobin: 9.9 g/dL — ABNORMAL LOW (ref 12.0–15.0)
MCH: 31.6 pg (ref 26.0–34.0)
MCHC: 31.9 g/dL (ref 30.0–36.0)
MCV: 99 fL (ref 78.0–100.0)
PLATELETS: 221 10*3/uL (ref 150–400)
RBC: 3.13 MIL/uL — AB (ref 3.87–5.11)
RDW: 13 % (ref 11.5–15.5)
WBC: 18.7 10*3/uL — ABNORMAL HIGH (ref 4.0–10.5)

## 2015-05-30 MED ORDER — CHLORHEXIDINE GLUCONATE CLOTH 2 % EX PADS
6.0000 | MEDICATED_PAD | Freq: Every day | CUTANEOUS | Status: AC
  Administered 2015-05-30 – 2015-06-02 (×4): 6 via TOPICAL

## 2015-05-30 MED ORDER — MUPIROCIN 2 % EX OINT
1.0000 "application " | TOPICAL_OINTMENT | Freq: Two times a day (BID) | CUTANEOUS | Status: AC
  Administered 2015-05-30 – 2015-06-03 (×10): 1 via NASAL
  Filled 2015-05-30: qty 22

## 2015-05-30 NOTE — Progress Notes (Signed)
Patient ID: Stephanie [name], female   DOB: 1981-04-07, 34 y.o.   MRN: [id number]  General Surgery [facility]  POD#: 2  Subjective: Patient more comfortable, family at bedside.  Tolerating regular diet.  Objective: Vital signs in last 24 hours: Temp:  [98 F (36.7 C)-98.4 F (36.9 C)] 98 F (36.7 C) (09/05 0448) Pulse Rate:  [87-99] 94 (09/05 0448) Resp:  [16-18] 16 (09/05 0448) BP: (115-125)/(66-73) 125/68 mmHg (09/05 0448) SpO2:  [100 %] 100 % (09/05 0448) Last BM Date: 05/27/15  Intake/Output from previous day: 09/04 0701 - 09/05 0700 In: 3067 [P.O.:1560; I.V.:1357; IV Piggyback:150] Out: 1700 [Urine:1700] Intake/Output this shift:    Physical Exam: HEENT - sclerae clear, mucous membranes moist Neck - soft GU - dressing dry and intact  Lab Results:   Recent Labs  05/28/15 1747 05/30/15 0419  WBC 18.3* 18.7*  HGB 11.8* 9.9*  HCT 34.7* 31.0*  PLT 239 221   BMET  Recent Labs  05/28/15 1747  NA 136  K 3.1*  CL 97*  CO2 29  GLUCOSE 90  BUN 8  CREATININE 0.46  CALCIUM 9.1   PT/INR No results for input(s): LABPROT, INR in the last 72 hours. Comprehensive Metabolic Panel:    Component Value Date/Time   NA 136 05/28/2015 1747   NA 140 04/28/2012 1812   K 3.1* 05/28/2015 1747   K 3.5 04/28/2012 1812   CL 97* 05/28/2015 1747   CL 105 04/28/2012 1812   CO2 29 05/28/2015 1747   CO2 25 09/16/2011 1348   BUN 8 05/28/2015 1747   BUN 8 04/28/2012 1812   CREATININE 0.46 05/28/2015 1747   CREATININE 0.50 04/28/2012 1812   GLUCOSE 90 05/28/2015 1747   GLUCOSE 86 04/28/2012 1812   CALCIUM 9.1 05/28/2015 1747   CALCIUM 9.5 09/16/2011 1348   AST 17 05/28/2015 1747   AST 13 12/04/2009 0750   ALT 12* 05/28/2015 1747   ALT 12 12/04/2009 0750   ALKPHOS 88 05/28/2015 1747   ALKPHOS 51 12/04/2009 0750   BILITOT 0.6 05/28/2015 1747   BILITOT 0.6 12/04/2009 0750   PROT 8.1 05/28/2015 1747   PROT 7.3 12/04/2009 0750   ALBUMIN 3.7  05/28/2015 1747   ALBUMIN 3.4* 12/04/2009 0750    Studies/Results: Ct Abdomen Pelvis W Contrast  05/28/2015   CLINICAL DATA:  Lower pelvic pain and pressure. Pelvic abnormalities on a noncontrast CT earlier today.  EXAM: CT ABDOMEN AND PELVIS WITH CONTRAST  TECHNIQUE: Multidetector CT imaging of the abdomen and pelvis was performed using the standard protocol following bolus administration of intravenous contrast.  CONTRAST:  OMNIPAQUE IOHEXOL 300 MG/ML  SOLN  FINDINGS: Diffuse medium to low density wall thickening of the rectum with severe luminal narrowing. The maximum wall thickness is 1.7 cm on image number 68. Again demonstrated is a perirectal fluid collection posteriorly and on both sides. This has peripheral rim enhancement and contains a tiny amount of gas. This fluid collection measures 6.5 x 4.9 cm on image number 70 for. There is also an oval left pelvic fluid collection with a thin peripheral enhancing rim measuring 5.9 x 3.9 cm on image number 66. This is in close proximity to the left ovary but not definitely arising from the ovary.  The sigmoid colon is mildly dilated and filled with fluid to the level of marked rectal narrowing. The colon proximal to the sigmoid colon is mildly dilated and filled with gas.  Unremarkable liver, spleen, pancreas, gallbladder,  adrenal glands, kidneys, ureters, urinary bladder, uterus and ovaries. No enlarged lymph nodes. Clear lung bases. Unremarkable bones.  IMPRESSION: 1. Marked proctitis with marked rectal luminal narrowing in partial: Obstruction. 2. 6.5 x 4.9 cm and 5.9 x 3.9 cm perirectal abscesses. The 6.5 cm abscess contains a small amount of gas.   Electronically Signed   By: Beckie Salts M.D.   On: 05/28/2015 20:13   Ct Renal Stone Study  05/28/2015   CLINICAL DATA:  34 year old female with abdominal and left flank pain and nausea  EXAM: CT ABDOMEN AND PELVIS WITHOUT CONTRAST  TECHNIQUE: Multidetector CT imaging of the abdomen and pelvis was  performed following the standard protocol without IV contrast.  COMPARISON:  None.  FINDINGS: Please note that parenchymal abnormalities may be missed without intravenous contrast.  Lower chest:  Unremarkable  Hepatobiliary: The liver and gallbladder are unremarkable. There is no evidence of biliary dilatation.  Pancreas: Unremarkable  Spleen: Unremarkable  Adrenals/Urinary Tract: The kidneys, adrenal glands and bladder are unremarkable. An indeterminate 1 mm calcification in the left anatomic pelvis (image 50) could represent a nonobstructing ureteral calculus but favor vascular.  Stomach/Bowel: There are is diffuse circumferential wall thickening of the rectum with apparent moderate fluid along the lateral and posterior aspect of the rectum. Mild gaseous distention of the colon is noted. No other definite bowel wall thickening is noted.  Vascular/Lymphatic: No enlarged lymph nodes or abdominal aortic aneurysm.  Reproductive: A 4 x 6.3 cm cyst in the posterior left pelvis appears to be separate from the left ovary. There is a hyperdense tubular structure extending from this cyst down to the tip of the coccyx.  Other: There may be a small amount of free fluid within the pelvis. There is no evidence of pneumoperitoneum.  Musculoskeletal: No acute or suspicious abnormalities are identified.  IMPRESSION: Diffuse circumferential wall thickening of the rectum with strong suggestion of surrounding fluid possibly representing infection with adjacent abscess but nonspecific. Further evaluation is recommended.  4 x 6.3 cm cyst in the posterior left pelvis with hyper dense tubular structure extending inferiorly down to the tip of the coccyx. This is nonspecific as well and may represent congenital or neural abnormality. Further evaluation is also recommended.  1 mm calcifications in the left anatomic pelvis which could represent a nonobstructing ureteral calculus but slightly favor vascular.   Electronically Signed   By:  Harmon Pier M.D.   On: 05/28/2015 18:14    Anti-infectives: Anti-infectives    Start     Dose/Rate Route Frequency Ordered Stop   05/29/15 0600  piperacillin-tazobactam (ZOSYN) IVPB 3.375 g     3.375 g 12.5 mL/hr over 240 Minutes Intravenous Every 8 hours 05/29/15 0211     05/28/15 2030  piperacillin-tazobactam (ZOSYN) IVPB 3.375 g     3.375 g 100 mL/hr over 30 Minutes Intravenous  Once 05/28/15 2029 05/28/15 2206      Assessment & Plans: Large multiloculated perirectal abscess  WBC improved but not normal - 18K - concerned about imcomplete drainage  Will remove packing today and begin Sitz baths  Check CBC in AM 9/6 - if still elevated, consider repeat CT scan  Velora Heckler, MD, [facility] Office: [phone]   Tiaira Arambula Judie Petit 05/30/2015

## 2015-05-31 ENCOUNTER — Inpatient Hospital Stay (HOSPITAL_COMMUNITY): Payer: Medicaid Other

## 2015-05-31 ENCOUNTER — Encounter (HOSPITAL_COMMUNITY): Payer: Self-pay | Admitting: Radiology

## 2015-05-31 LAB — CBC
HCT: 33 % — ABNORMAL LOW (ref 36.0–46.0)
Hemoglobin: 10.9 g/dL — ABNORMAL LOW (ref 12.0–15.0)
MCH: 32.9 pg (ref 26.0–34.0)
MCHC: 33 g/dL (ref 30.0–36.0)
MCV: 99.7 fL (ref 78.0–100.0)
PLATELETS: 237 10*3/uL (ref 150–400)
RBC: 3.31 MIL/uL — AB (ref 3.87–5.11)
RDW: 13 % (ref 11.5–15.5)
WBC: 18.1 10*3/uL — AB (ref 4.0–10.5)

## 2015-05-31 MED ORDER — IOHEXOL 300 MG/ML  SOLN
25.0000 mL | INTRAMUSCULAR | Status: AC
  Administered 2015-05-31: 25 mL via ORAL

## 2015-05-31 MED ORDER — MIDAZOLAM HCL 2 MG/2ML IJ SOLN
INTRAMUSCULAR | Status: AC | PRN
  Administered 2015-05-31: 1 mg via INTRAVENOUS
  Administered 2015-05-31: 0.5 mg via INTRAVENOUS
  Administered 2015-05-31: 1 mg via INTRAVENOUS
  Administered 2015-05-31: 0.5 mg via INTRAVENOUS
  Administered 2015-05-31: 1 mg via INTRAVENOUS

## 2015-05-31 MED ORDER — FENTANYL CITRATE (PF) 100 MCG/2ML IJ SOLN
INTRAMUSCULAR | Status: AC | PRN
  Administered 2015-05-31 (×2): 25 ug via INTRAVENOUS
  Administered 2015-05-31: 50 ug via INTRAVENOUS

## 2015-05-31 MED ORDER — IOHEXOL 300 MG/ML  SOLN
100.0000 mL | Freq: Once | INTRAMUSCULAR | Status: AC | PRN
  Administered 2015-05-31: 100 mL via INTRAVENOUS

## 2015-05-31 MED ORDER — FENTANYL CITRATE (PF) 100 MCG/2ML IJ SOLN
INTRAMUSCULAR | Status: AC
  Filled 2015-05-31: qty 4

## 2015-05-31 MED ORDER — MIDAZOLAM HCL 2 MG/2ML IJ SOLN
INTRAMUSCULAR | Status: AC
  Filled 2015-05-31: qty 6

## 2015-05-31 NOTE — Procedures (Signed)
Interventional Radiology Procedure Note  Procedure: 1) CT guided drainage of posterior left pelvic abscess with drain placement 2) CT guided needle aspiration drainage of left perirectal fluid collection  Complications:  None  Estimated Blood Loss: < 10 mL  Findings:  Rounded posterior left pelvic fluid collection yielded grossly purulent fluid.  Sample sent for culture.  10 Fr pigtail drain placed and attached to suction bulb. Left perirectal fluid collection extending to obturator region yielded thin, bloody fluid; not purulent.  15 mL drawn off by syringe and sample sent for culture studies.  Drain not placed in this collection.  Plan:  Will follow drain output and culture results.  Jodi Marble. Fredia Sorrow, M.D Pager:  [phone]

## 2015-05-31 NOTE — H&P (Signed)
Chief Complaint: Patient was seen in consultation today for perirectal abscess Chief Complaint  Patient presents with  . Abdominal Pain   at the request of [facility] Dr. Derrell Lolling   Referring Physician(s): [facility] Dr. Derrell Lolling   History of Present Illness: Stephanie [name] is a 34 y.o. female with 1 week history of pelvic pressure subjective fevers and nausea. She was seen by her PCP and was then referred to specialist for hemorrhoids. She states her pelvic pressure was so bad that she decided to call EMS and went to ED. She was found to have leukocytosis and perirectal abscess s/p I&D on 9/4. She has continued leukocytosis and repeat CT was done today revealing persistent pelvic fluid collections. IR received request for percutaneous abscess drain placement. The patient denies any chest pain, shortness of breath or palpitations. She denies any active signs of bleeding or excessive bruising. The patient denies any history of sleep apnea or chronic oxygen use. She denies any known complications to sedation.    Past Medical History  Diagnosis Date  . H/O cesarean section 2010  . Vaginal delivery 2004 , 2008, 2009  . Diabetes mellitus     gestational  . Anemia   . Panic attacks   . Depression   . Schizophrenia   . Hemorrhoids     Past Surgical History  Procedure Laterality Date  . Tubal ligation  2010  . Cesarean section    . Incision and drainage perirectal abscess N/A 05/28/2015    Procedure: IRRIGATION AND DEBRIDEMENT PERIRECTAL ABSCESS;  Surgeon: Darnell Level, MD;  Location: [facility];  Service: General;  Laterality: N/A;    Allergies: Review of patient's allergies indicates no known allergies.  Medications: Prior to Admission medications   Medication Sig Start Date End Date Taking? Authorizing Provider  acetaminophen (TYLENOL) 500 MG tablet Take 1,000 mg by mouth every 6 (six) hours as needed for moderate pain.   Yes Historical Provider, MD  Aspirin-Acetaminophen-Caffeine (GOODY  HEADACHE PO) Take 1 Package by mouth daily as needed (fr pain).   Yes Historical Provider, MD  furosemide (LASIX) 20 MG tablet Take 1 tablet by mouth daily as needed for fluid.  02/26/15  Yes Historical Provider, MD  lidocaine (XYLOCAINE) 2 % jelly Apply 1 application topically 2 (two) times daily as needed. Patient taking differently: Apply 1 application topically 2 (two) times daily as needed (pain).  01/25/15  Yes Jennifer Piepenbrink, PA-C  magnesium citrate ([employer] MAGNESIUM CITRATE) SOLN Take 1 Bottle by mouth once.   Yes Historical Provider, MD  phenylephrine-shark liver oil-mineral oil-petrolatum (PREPARATION H) 0.25-3-14-71.9 % rectal ointment Place 1 application rectally 2 (two) times daily as needed for hemorrhoids (hemorrhoids). pain   Yes Historical Provider, MD  polyethylene glycol powder (GLYCOLAX/MIRALAX) powder Take 17 g by mouth 2 (two) times daily. Until daily soft stools  OTC 01/25/15  Yes Jennifer Piepenbrink, PA-C  hydrocortisone (ANUSOL-HC) 25 MG suppository Place 1 suppository (25 mg total) rectally 2 (two) times daily. For 7 days Patient not taking: Reported on 05/28/2015 01/25/15   Felicie Morn, NP  lidocaine (XYLOCAINE JELLY) 2 % jelly Apply 1 application topically 2 (two) times daily as needed. Patient not taking: Reported on 01/25/2015 11/18/13   Ivonne Andrew, PA-C  polyethylene glycol powder (GLYCOLAX/MIRALAX) powder Take 17 g by mouth daily. Patient not taking: Reported on 01/25/2015 11/18/13   Ivonne Andrew, PA-C     Family History  Problem Relation Age of Onset  . Hypertension Other   . Hypertension  Mother     Social History   Social History  . Marital Status: Single    Spouse Name: N/A  . Number of Children: N/A  . Years of Education: N/A   Social History Main Topics  . Smoking status: Current Every Day Smoker -- 0.50 packs/day    Types: Cigarettes  . Smokeless tobacco: None  . Alcohol Use: Yes     Comment: ocassionally  . Drug Use: 3.00 per week    Special:  Marijuana  . Sexual Activity: Yes   Other Topics Concern  . None   Social History Narrative   Review of Systems: A 12 point ROS discussed and pertinent positives are indicated in the HPI above.  All other systems are negative.  Review of Systems  Vital Signs: BP 123/77 mmHg  Pulse 88  Temp(Src) 98.2 F (36.8 C) (Oral)  Resp 16  Ht 5\' 6"  (1.676 m)  Wt 175 lb (79.379 kg)  BMI 28.26 kg/m2  SpO2 100%  LMP 05/19/2015  Physical Exam  Constitutional: She is oriented to person, place, and time. No distress.  HENT:  Head: Normocephalic and atraumatic.  Cardiovascular: Normal rate and regular rhythm.  Exam reveals no gallop and no friction rub.   No murmur heard. Pulmonary/Chest: Effort normal and breath sounds normal. No respiratory distress. She has no wheezes. She has no rales.  Abdominal: Soft. Bowel sounds are normal.  Neurological: She is alert and oriented to person, place, and time.  Skin: Skin is warm and dry. She is not diaphoretic.    Mallampati Score:  MD Evaluation Airway: WNL Heart: WNL Abdomen: WNL Chest/ Lungs: WNL ASA  Classification: 2 Mallampati/Airway Score: Two  Imaging: Ct Abdomen Pelvis W Contrast  05/31/2015   CLINICAL DATA:  Persistent perirectal pain, recent perirectal abscess, increasing WBC  EXAM: CT ABDOMEN AND PELVIS WITH CONTRAST  TECHNIQUE: Multidetector CT imaging of the abdomen and pelvis was performed using the standard protocol following bolus administration of intravenous contrast.  CONTRAST:  OMNIPAQUE IOHEXOL 300 MG/ML  SOLN  COMPARISON:  05/28/2015  FINDINGS: There is moderate gaseous distension of the colon best seen on scout view suspicious for colonic ileus or partial obstruction. Lung bases shows dependent atelectasis bilateral lung bases posteriorly.  Liver, pancreas, spleen and adrenal glands are unremarkable.  Moderate stool noted within cecum. Moderate gas noted within cecum. No pericecal inflammation. Normal appendix. There  is fecal material within terminal ileum probable incompetent ileocecal valve.  There is no small bowel obstruction. No free abdominal air is noted. Kidneys are symmetrical in size and enhancement. No hydronephrosis or hydroureter.  Delayed renal images shows bilateral renal symmetrical excretion. Bilateral visualized ureter on delayed images is unremarkable. Contrast material is noted within urinary bladder on delayed images.  Again noted thickening of rectal anal wall consistent with proctitis. There is significant narrowing of anal lumen highly suspicious for at least partial obstruction. Liquid stool and gas noted within moderate distended sigmoid colon. There is improvement from prior exam in perirectal U shaped abscess. Small amount of perirectal air is probable post instrumentation. Persistent left posterior pelvic fluid collection best seen in axial image 70 measures 6 by 4.5 cm. On the prior exam measures 6 x 4 cm. There is small amount of residual abscess left perirectal with some air-fluid level best seen in image 76 measures 4.8 by 2.5 cm. There is small abscess in left mid pelvis just medial to left acetabulum measures 4 by 1.7 cm.  There is no evidence  of septic joint. Bilateral hip joints are unremarkable. Pubic symphysis is unremarkable.  There is mild thickening of left posterior wall of the urinary bladder suspicious for satellite cystitis. Small amount of pelvic free fluid is noted. There is small amount of air in left posterior perineum probable site of recent surgery best seen in axial image 85.  Small amount of free fluid perihepatic inferiorly see axial image 32. Normal appendix.  IMPRESSION: 1. There is colonic dilatation highly suspicious for at least partial colonic obstruction. 2.  Again noted thickening of rectal anal wall consistent with proctitis. There is significant narrowing of anal lumen highly suspicious for at least partial obstruction. Liquid stool and gas noted within moderate  distended sigmoid colon. There is improvement from prior exam in perirectal U shaped abscess. Small amount of perirectal air is probable post instrumentation. Persistent left posterior pelvic fluid collection best seen in axial image 70 measures 6 by 4.5 cm. On the prior exam measures 6 x 4 cm. There is small amount of residual abscess left perirectal with some air-fluid level best seen in image 76 measures 4.8 by 2.5 cm. There is small abscess in left mid pelvis just medial to left acetabulum measures 4 by 1.7 cm.  3. Mild thickening of left posterior wall of the urinary bladder suspicious for satellite cystitis. Small amount of pelvic free fluid.   Electronically Signed   By: Natasha Mead M.D.   On: 05/31/2015 11:49   Ct Abdomen Pelvis W Contrast  05/28/2015   CLINICAL DATA:  Lower pelvic pain and pressure. Pelvic abnormalities on a noncontrast CT earlier today.  EXAM: CT ABDOMEN AND PELVIS WITH CONTRAST  TECHNIQUE: Multidetector CT imaging of the abdomen and pelvis was performed using the standard protocol following bolus administration of intravenous contrast.  CONTRAST:  OMNIPAQUE IOHEXOL 300 MG/ML  SOLN  FINDINGS: Diffuse medium to low density wall thickening of the rectum with severe luminal narrowing. The maximum wall thickness is 1.7 cm on image number 68. Again demonstrated is a perirectal fluid collection posteriorly and on both sides. This has peripheral rim enhancement and contains a tiny amount of gas. This fluid collection measures 6.5 x 4.9 cm on image number 70 for. There is also an oval left pelvic fluid collection with a thin peripheral enhancing rim measuring 5.9 x 3.9 cm on image number 66. This is in close proximity to the left ovary but not definitely arising from the ovary.  The sigmoid colon is mildly dilated and filled with fluid to the level of marked rectal narrowing. The colon proximal to the sigmoid colon is mildly dilated and filled with gas.  Unremarkable liver, spleen,  pancreas, gallbladder, adrenal glands, kidneys, ureters, urinary bladder, uterus and ovaries. No enlarged lymph nodes. Clear lung bases. Unremarkable bones.  IMPRESSION: 1. Marked proctitis with marked rectal luminal narrowing in partial: Obstruction. 2. 6.5 x 4.9 cm and 5.9 x 3.9 cm perirectal abscesses. The 6.5 cm abscess contains a small amount of gas.   Electronically Signed   By: Beckie Salts M.D.   On: 05/28/2015 20:13   Ct Renal Stone Study  05/28/2015   CLINICAL DATA:  34 year old female with abdominal and left flank pain and nausea  EXAM: CT ABDOMEN AND PELVIS WITHOUT CONTRAST  TECHNIQUE: Multidetector CT imaging of the abdomen and pelvis was performed following the standard protocol without IV contrast.  COMPARISON:  None.  FINDINGS: Please note that parenchymal abnormalities may be missed without intravenous contrast.  Lower chest:  Unremarkable  Hepatobiliary: The liver and gallbladder are unremarkable. There is no evidence of biliary dilatation.  Pancreas: Unremarkable  Spleen: Unremarkable  Adrenals/Urinary Tract: The kidneys, adrenal glands and bladder are unremarkable. An indeterminate 1 mm calcification in the left anatomic pelvis (image 50) could represent a nonobstructing ureteral calculus but favor vascular.  Stomach/Bowel: There are is diffuse circumferential wall thickening of the rectum with apparent moderate fluid along the lateral and posterior aspect of the rectum. Mild gaseous distention of the colon is noted. No other definite bowel wall thickening is noted.  Vascular/Lymphatic: No enlarged lymph nodes or abdominal aortic aneurysm.  Reproductive: A 4 x 6.3 cm cyst in the posterior left pelvis appears to be separate from the left ovary. There is a hyperdense tubular structure extending from this cyst down to the tip of the coccyx.  Other: There may be a small amount of free fluid within the pelvis. There is no evidence of pneumoperitoneum.  Musculoskeletal: No acute or suspicious  abnormalities are identified.  IMPRESSION: Diffuse circumferential wall thickening of the rectum with strong suggestion of surrounding fluid possibly representing infection with adjacent abscess but nonspecific. Further evaluation is recommended.  4 x 6.3 cm cyst in the posterior left pelvis with hyper dense tubular structure extending inferiorly down to the tip of the coccyx. This is nonspecific as well and may represent congenital or neural abnormality. Further evaluation is also recommended.  1 mm calcifications in the left anatomic pelvis which could represent a nonobstructing ureteral calculus but slightly favor vascular.   Electronically Signed   By: Harmon Pier M.D.   On: 05/28/2015 18:14    Labs:  CBC:  Recent Labs  05/28/15 1747 05/30/15 0419 05/31/15 0435  WBC 18.3* 18.7* 18.1*  HGB 11.8* 9.9* 10.9*  HCT 34.7* 31.0* 33.0*  PLT 239 221 237    COAGS: No results for input(s): INR, APTT in the last 8760 hours.  BMP:  Recent Labs  05/28/15 1747  NA 136  K 3.1*  CL 97*  CO2 29  GLUCOSE 90  BUN 8  CALCIUM 9.1  CREATININE 0.46  GFRNONAA >60  GFRAA >60    LIVER FUNCTION TESTS:  Recent Labs  05/28/15 1747  BILITOT 0.6  AST 17  ALT 12*  ALKPHOS 88  PROT 8.1  ALBUMIN 3.7    Assessment and Plan: Pelvic pain x 1 week with associated nausea and fevers Leukocytosis Perirectal abscess S/p surgical I&D 05/29/15 still with continued leukocytosis Repeat CT today with persistent fluid collections, request for percutaneous drain placement Imaging has been reviewed by Dr. Fredia Sorrow and will place drain via Left Transgluteal approach today The patient has been NPO, no blood thinners taken, labs and vitals have been reviewed. Risks and Benefits discussed with the patient including bleeding, infection and damage to adjacent structures. All of the patient's questions were answered, patient is agreeable to proceed. Consent signed and in chart.    Thank you for this  interesting consult.  I greatly enjoyed meeting LORECE KEACH and look forward to participating in their care.  A copy of this report was sent to the requesting provider on this date.  SignedBerneta Levins 05/31/2015, 2:35 PM   I spent a total of 20 Minutes in face to face in clinical consultation, greater than 50% of which was counseling/coordinating care for perirectal abscess.

## 2015-05-31 NOTE — Progress Notes (Signed)
[facility] Progress Note  3 Days Post-Op  Subjective: Pt says she feels significantly better.  Worried about having a BM.  Having flatus, urinating normally.  No N/V, tolerating diet well.  Refused to let anyone re-pack her abscess.  Dry dressing at outside.  Pending sitz baths.  Objective: Vital signs in last 24 hours: Temp:  [98 F (36.7 C)-98.7 F (37.1 C)] 98.7 F (37.1 C) (09/06 0503) Pulse Rate:  [78-98] 87 (09/06 0503) Resp:  [16] 16 (09/06 0503) BP: (109-119)/(57-70) 109/57 mmHg (09/06 0503) SpO2:  [97 %-100 %] 100 % (09/06 0503) Last BM Date: 05/27/15  Intake/Output from previous day: 09/05 0701 - 09/06 0700 In: 2096 [P.O.:720; I.V.:1139; IV Piggyback:237] Out: 850 [Urine:850] Intake/Output this shift:    PE: Gen:  Alert, NAD, pleasant Abd: Soft, NT/ND, +BS, no HSM Rectum:  Perirectal tissue is soft, not indurated or fluctuant, serosanguinous drainage from wound pours out with palpation of I&D site.  Patient does have tenderness to this area.  No purulent drainage or foul smell.   Lab Results:   Recent Labs  05/30/15 0419 05/31/15 0435  WBC 18.7* 18.1*  HGB 9.9* 10.9*  HCT 31.0* 33.0*  PLT 221 237   BMET  Recent Labs  05/28/15 1747  NA 136  K 3.1*  CL 97*  CO2 29  GLUCOSE 90  BUN 8  CREATININE 0.46  CALCIUM 9.1   PT/INR No results for input(s): LABPROT, INR in the last 72 hours. CMP     Component Value Date/Time   NA 136 05/28/2015 1747   K 3.1* 05/28/2015 1747   CL 97* 05/28/2015 1747   CO2 29 05/28/2015 1747   GLUCOSE 90 05/28/2015 1747   BUN 8 05/28/2015 1747   CREATININE 0.46 05/28/2015 1747   CALCIUM 9.1 05/28/2015 1747   PROT 8.1 05/28/2015 1747   ALBUMIN 3.7 05/28/2015 1747   AST 17 05/28/2015 1747   ALT 12* 05/28/2015 1747   ALKPHOS 88 05/28/2015 1747   BILITOT 0.6 05/28/2015 1747   GFRNONAA >60 05/28/2015 1747   GFRAA >60 05/28/2015 1747   Lipase     Component Value Date/Time   LIPASE 10* 05/28/2015 1747        Studies/Results: No results found.  Anti-infectives: Anti-infectives    Start     Dose/Rate Route Frequency Ordered Stop   05/29/15 0600  piperacillin-tazobactam (ZOSYN) IVPB 3.375 g     3.375 g 12.5 mL/hr over 240 Minutes Intravenous Every 8 hours 05/29/15 0211     05/28/15 2030  piperacillin-tazobactam (ZOSYN) IVPB 3.375 g     3.375 g 100 mL/hr over 30 Minutes Intravenous  Once 05/28/15 2029 05/28/15 2206       Assessment/Plan Large multiloculated horseshoe perirectal abscess POD #2 I&D, open packing - Dr. Gerrit Friends -WBC still 18K - concerned about imcomplete drainage -Will remove packing today and begin Sitz baths -Ambulate and IS -SCD's and hold Lovenox in case we need to take her back to OR, await CT scan -Clears for CT.  Repeat CT scan today to check for incomplete drainage    LOS: 2 days    Nonie Hoyer 05/31/2015, 7:42 AM Pager: [phone]

## 2015-05-31 NOTE — Plan of Care (Signed)
Problem: Phase I Progression Outcomes Goal: OOB as tolerated unless otherwise ordered Outcome: Progressing oob to BSC to void

## 2015-05-31 NOTE — Plan of Care (Signed)
Problem: Phase I Progression Outcomes Goal: Incision/dressings dry and intact Outcome: Not Applicable Date Met:  73/41/93 Packing removed on 9/5. Change ABD pad prn that is over open area.

## 2015-05-31 NOTE — Progress Notes (Signed)
  Gen. surgery attending:  CT scan shows a large water density fluid collection in the left cul-de-sac and a smaller water density fluid collection in the right cul-de-sac.  Dr.  Fredia Sorrow feels that these are both adnexal cysts.  In addition is also a higher density but smaller fluid collection in the left pelvis lateral to the rectum above the levator muscles with an air-fluid level.  Dr. Fredia Sorrow is going to try to drain the lower part of this, but the upper part cannot be drained because he would have to go through the sciatic notch.  In any event he is going to try to get fluid for culture from both areas and possibly even drain in the left supralevator fluid collection.  In general, the CT scan looks much better with most of the fluid collections having been drained.   Stephanie [name]. Derrell Lolling, M.D., [facility] General and Minimally invasive Surgery Breast and Colorectal Surgery Office:   [phone]

## 2015-06-01 MED ORDER — DOCUSATE SODIUM 100 MG PO CAPS
100.0000 mg | ORAL_CAPSULE | Freq: Two times a day (BID) | ORAL | Status: DC
  Administered 2015-06-01 – 2015-06-04 (×7): 100 mg via ORAL
  Filled 2015-06-01 (×7): qty 1

## 2015-06-01 MED ORDER — NICOTINE POLACRILEX 2 MG MT GUM
2.0000 mg | CHEWING_GUM | OROMUCOSAL | Status: DC | PRN
  Filled 2015-06-01: qty 1

## 2015-06-01 MED ORDER — ENOXAPARIN SODIUM 40 MG/0.4ML ~~LOC~~ SOLN
40.0000 mg | SUBCUTANEOUS | Status: DC
  Administered 2015-06-01 – 2015-06-03 (×3): 40 mg via SUBCUTANEOUS
  Filled 2015-06-01 (×3): qty 0.4

## 2015-06-01 NOTE — Progress Notes (Signed)
Referring Physician(s): CCS  Chief Complaint: Pelvic/perirectal abscess s/p drain and aspiration 9/6  Subjective: Patient states she feels overall better and is tolerating soft food. She admits to some tenderness at drain site, but not too severe.   Allergies: Review of patient's allergies indicates no known allergies.  Medications: Prior to Admission medications   Medication Sig Start Date End Date Taking? Authorizing Provider  acetaminophen (TYLENOL) 500 MG tablet Take 1,000 mg by mouth every 6 (six) hours as needed for moderate pain.   Yes Historical Provider, MD  Aspirin-Acetaminophen-Caffeine (GOODY HEADACHE PO) Take 1 Package by mouth daily as needed (fr pain).   Yes Historical Provider, MD  furosemide (LASIX) 20 MG tablet Take 1 tablet by mouth daily as needed for fluid.  02/26/15  Yes Historical Provider, MD  lidocaine (XYLOCAINE) 2 % jelly Apply 1 application topically 2 (two) times daily as needed. Patient taking differently: Apply 1 application topically 2 (two) times daily as needed (pain).  01/25/15  Yes Jennifer Piepenbrink, PA-C  magnesium citrate ([employer] MAGNESIUM CITRATE) SOLN Take 1 Bottle by mouth once.   Yes Historical Provider, MD  phenylephrine-shark liver oil-mineral oil-petrolatum (PREPARATION H) 0.25-3-14-71.9 % rectal ointment Place 1 application rectally 2 (two) times daily as needed for hemorrhoids (hemorrhoids). pain   Yes Historical Provider, MD  polyethylene glycol powder (GLYCOLAX/MIRALAX) powder Take 17 g by mouth 2 (two) times daily. Until daily soft stools  OTC 01/25/15  Yes Jennifer Piepenbrink, PA-C  hydrocortisone (ANUSOL-HC) 25 MG suppository Place 1 suppository (25 mg total) rectally 2 (two) times daily. For 7 days Patient not taking: Reported on 05/28/2015 01/25/15   Felicie Morn, NP  lidocaine (XYLOCAINE JELLY) 2 % jelly Apply 1 application topically 2 (two) times daily as needed. Patient not taking: Reported on 01/25/2015 11/18/13   Ivonne Andrew, PA-C    polyethylene glycol powder (GLYCOLAX/MIRALAX) powder Take 17 g by mouth daily. Patient not taking: Reported on 01/25/2015 11/18/13   Ivonne Andrew, PA-C    Vital Signs: BP 116/65 mmHg  Pulse 92  Temp(Src) 98.7 F (37.1 C) (Oral)  Resp 16  Ht 5\' 6"  (1.676 m)  Wt 175 lb (79.379 kg)  BMI 28.26 kg/m2  SpO2 100%  LMP 05/19/2015  Physical Exam General: A&Ox3, NAD Abd: Soft, NT, Left TG drain intact TTP, white thick output in bulb  Imaging: Ct Abdomen Pelvis W Contrast  05/31/2015   CLINICAL DATA:  Persistent perirectal pain, recent perirectal abscess, increasing WBC  EXAM: CT ABDOMEN AND PELVIS WITH CONTRAST  TECHNIQUE: Multidetector CT imaging of the abdomen and pelvis was performed using the standard protocol following bolus administration of intravenous contrast.  CONTRAST:  OMNIPAQUE IOHEXOL 300 MG/ML  SOLN  COMPARISON:  05/28/2015  FINDINGS: There is moderate gaseous distension of the colon best seen on scout view suspicious for colonic ileus or partial obstruction. Lung bases shows dependent atelectasis bilateral lung bases posteriorly.  Liver, pancreas, spleen and adrenal glands are unremarkable.  Moderate stool noted within cecum. Moderate gas noted within cecum. No pericecal inflammation. Normal appendix. 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  There is no small bowel obstruction. No free abdominal air is noted. Kidneys are symmetrical in size and enhancement. No hydronephrosis or hydroureter.  Delayed renal images shows bilateral renal symmetrical excretion. Bilateral visualized ureter on delayed images is unremarkable. Contrast material is noted within urinary bladder on delayed images.  Again noted thickening of rectal anal wall consistent with proctitis. There is significant narrowing  of anal lumen highly suspicious for at least partial obstruction. Liquid stool and gas noted within moderate distended sigmoid colon. There is improvement  from prior exam in perirectal U shaped abscess. Small amount of perirectal air is probable post instrumentation. Persistent left posterior pelvic fluid collection best seen in axial image 70 measures 6 by 4.5 cm. On the prior exam measures 6 x 4 cm. There is small amount of residual abscess left perirectal with some air-fluid level best seen in image 76 measures 4.8 by 2.5 cm. There is small abscess in left mid pelvis just medial to left acetabulum measures 4 by 1.7 cm.  There is no evidence of septic joint. Bilateral hip joints are unremarkable. Pubic symphysis is unremarkable.  There is mild thickening of left posterior wall of the urinary bladder suspicious for satellite cystitis. Small amount of pelvic free fluid is noted. There is small amount of air in left posterior perineum probable site of recent surgery best seen in axial image 85.  Small amount of free fluid perihepatic inferiorly see axial image 32. Normal appendix.  IMPRESSION: 1. There is colonic dilatation highly suspicious for at least partial colonic obstruction. 2.  Again noted thickening of rectal anal wall consistent with proctitis. There is significant narrowing of anal lumen highly suspicious for at least partial obstruction. Liquid stool and gas noted within moderate distended sigmoid colon. There is improvement from prior exam in perirectal U shaped abscess. Small amount of perirectal air is probable post instrumentation. Persistent left posterior pelvic fluid collection best seen in axial image 70 measures 6 by 4.5 cm. On the prior exam measures 6 x 4 cm. There is small amount of residual abscess left perirectal with some air-fluid level best seen in image 76 measures 4.8 by 2.5 cm. There is small abscess in left mid pelvis just medial to left acetabulum measures 4 by 1.7 cm.  3. Mild thickening of left posterior wall of the urinary bladder suspicious for satellite cystitis. Small amount of pelvic free fluid.   Electronically Signed   By:  Natasha Mead M.D.   On: 05/31/2015 11:49   Ct Abdomen Pelvis W Contrast  05/28/2015   CLINICAL DATA:  Lower pelvic pain and pressure. Pelvic abnormalities on a noncontrast CT earlier today.  EXAM: CT ABDOMEN AND PELVIS WITH CONTRAST  TECHNIQUE: Multidetector CT imaging of the abdomen and pelvis was performed using the standard protocol following bolus administration of intravenous contrast.  CONTRAST:  OMNIPAQUE IOHEXOL 300 MG/ML  SOLN  FINDINGS: Diffuse medium to low density wall thickening of the rectum with severe luminal narrowing. The maximum wall thickness is 1.7 cm on image number 68. Again demonstrated is a perirectal fluid collection posteriorly and on both sides. This has peripheral rim enhancement and contains a tiny amount of gas. This fluid collection measures 6.5 x 4.9 cm on image number 70 for. There is also an oval left pelvic fluid collection with a thin peripheral enhancing rim measuring 5.9 x 3.9 cm on image number 66. This is in close proximity to the left ovary but not definitely arising from the ovary.  The sigmoid colon is mildly dilated and filled with fluid to the level of marked rectal narrowing. The colon proximal to the sigmoid colon is mildly dilated and filled with gas.  Unremarkable liver, spleen, pancreas, gallbladder, adrenal glands, kidneys, ureters, urinary bladder, uterus and ovaries. No enlarged lymph nodes. Clear lung bases. Unremarkable bones.  IMPRESSION: 1. Marked proctitis with marked rectal luminal narrowing in  partial: Obstruction. 2. 6.5 x 4.9 cm and 5.9 x 3.9 cm perirectal abscesses. The 6.5 cm abscess contains a small amount of gas.   Electronically Signed   By: Beckie Salts M.D.   On: 05/28/2015 20:13   Ct Aspiration  05/31/2015   CLINICAL DATA:  Status post operative incision and drainage of large perirectal abscess 2 days ago. Followup CT performed earlier today due to persistent elevation of white blood cell count demonstrates improvement in the perirectal  abscess with residual posterior left pelvic fluid collection identified as well as new collection in the left perirectal area extending superiorly into the obturator region containing fluid and air.  EXAM: 1. CT-GUIDED PERCUTANEOUS DRAINAGE OF LEFT POSTERIOR PELVIC ABSCESS WITH PLACEMENT OF INDWELLING DRAINAGE CATHETER. 2. CT-GUIDED PERCUTANEOUS NEEDLE ASPIRATION DRAINAGE OF LEFT PERIRECTAL FLUID COLLECTION.  ANESTHESIA/SEDATION: 4.0 Mg IV Versed 100 mcg IV Fentanyl  Total Moderate Sedation Time:  34 minutes  PROCEDURE: The procedure, risks, benefits, and alternatives were explained to the patient. Questions regarding the procedure were encouraged and answered. The patient understands and consents to the procedure. A time-out was performed prior to the procedure.  The left gluteal region was prepped with Betadine in a sterile fashion, and a sterile drape was applied covering the operative field. A sterile gown and sterile gloves were used for the procedure. Local anesthesia was provided with 1% Lidocaine.  CT was performed in a prone position. After localizing collections, skin was marked and prepped. An 18 gauge trocar needle was initially advanced under CT guidance into the posterior left pelvic abscess. Aspiration of a fluid sample was performed and fluid sent for culture analysis. A guidewire was advanced into the collection. The tract was dilated and a 10 French percutaneous drainage catheter placed. This was attached to suction bulb drainage. The catheter was flushed with saline and secured at the skin with a Prolene retention suture and StatLock device.  A more inferior left perirectal collection was then targeted. Under CT guidance, an 18 gauge trocar needle was advanced into this collection. Aspiration was performed. A fluid sample was submitted for culture analysis.  COMPLICATIONS: None  FINDINGS: The oval-shaped posterior left pelvic fluid collection located anterior to the lower sacrum measures  approximately 5.2 x 4.5 cm. Aspiration yielded grossly purulent fluid. A 10 French drain was therefore placed and will be attached to suction bulb drainage.  The smaller and more inferior collection yielded thin, bloody fluid. A sample was sent for culture analysis and labeled left perirectal fluid collection. Aspiration through an 18 gauge needle yielded 15 mL of fluid as well as some air. CT shows significant decompression after needle aspiration and given nature of fluid, a drain was not placed.  IMPRESSION: 1. Posterior left pelvic fluid collection yielded purulent fluid. A 10 French drain was placed. A sample was sent for culture analysis as left pelvic abscess. 2. The smaller left perirectal collection tracking towards the obturator region yielded thin, bloody fluid. 15 mL was able to be aspirated, resulting in significant decompression by CT. A drain was not left in this collection. A sample was sent for culture analysis as left perirectal fluid collection.   Electronically Signed   By: Irish Lack M.D.   On: 05/31/2015 17:42   Ct Renal Stone Study  05/28/2015   CLINICAL DATA:  34 year old female with abdominal and left flank pain and nausea  EXAM: CT ABDOMEN AND PELVIS WITHOUT CONTRAST  TECHNIQUE: Multidetector CT imaging of the abdomen and pelvis was performed following  the standard protocol without IV contrast.  COMPARISON:  None.  FINDINGS: Please note that parenchymal abnormalities may be missed without intravenous contrast.  Lower chest:  Unremarkable  Hepatobiliary: The liver and gallbladder are unremarkable. There is no evidence of biliary dilatation.  Pancreas: Unremarkable  Spleen: Unremarkable  Adrenals/Urinary Tract: The kidneys, adrenal glands and bladder are unremarkable. An indeterminate 1 mm calcification in the left anatomic pelvis (image 50) could represent a nonobstructing ureteral calculus but favor vascular.  Stomach/Bowel: There are is diffuse circumferential wall thickening of the  rectum with apparent moderate fluid along the lateral and posterior aspect of the rectum. Mild gaseous distention of the colon is noted. No other definite bowel wall thickening is noted.  Vascular/Lymphatic: No enlarged lymph nodes or abdominal aortic aneurysm.  Reproductive: A 4 x 6.3 cm cyst in the posterior left pelvis appears to be separate from the left ovary. There is a hyperdense tubular structure extending from this cyst down to the tip of the coccyx.  Other: There may be a small amount of free fluid within the pelvis. There is no evidence of pneumoperitoneum.  Musculoskeletal: No acute or suspicious abnormalities are identified.  IMPRESSION: Diffuse circumferential wall thickening of the rectum with strong suggestion of surrounding fluid possibly representing infection with adjacent abscess but nonspecific. Further evaluation is recommended.  4 x 6.3 cm cyst in the posterior left pelvis with hyper dense tubular structure extending inferiorly down to the tip of the coccyx. This is nonspecific as well and may represent congenital or neural abnormality. Further evaluation is also recommended.  1 mm calcifications in the left anatomic pelvis which could represent a nonobstructing ureteral calculus but slightly favor vascular.   Electronically Signed   By: Harmon Pier M.D.   On: 05/28/2015 18:14   Ct Image Guided Drainage By Percutaneous Catheter  05/31/2015   CLINICAL DATA:  Status post operative incision and drainage of large perirectal abscess 2 days ago. Followup CT performed earlier today due to persistent elevation of white blood cell count demonstrates improvement in the perirectal abscess with residual posterior left pelvic fluid collection identified as well as new collection in the left perirectal area extending superiorly into the obturator region containing fluid and air.  EXAM: 1. CT-GUIDED PERCUTANEOUS DRAINAGE OF LEFT POSTERIOR PELVIC ABSCESS WITH PLACEMENT OF INDWELLING DRAINAGE CATHETER. 2.  CT-GUIDED PERCUTANEOUS NEEDLE ASPIRATION DRAINAGE OF LEFT PERIRECTAL FLUID COLLECTION.  ANESTHESIA/SEDATION: 4.0 Mg IV Versed 100 mcg IV Fentanyl  Total Moderate Sedation Time:  34 minutes  PROCEDURE: The procedure, risks, benefits, and alternatives were explained to the patient. Questions regarding the procedure were encouraged and answered. The patient understands and consents to the procedure. A time-out was performed prior to the procedure.  The left gluteal region was prepped with Betadine in a sterile fashion, and a sterile drape was applied covering the operative field. A sterile gown and sterile gloves were used for the procedure. Local anesthesia was provided with 1% Lidocaine.  CT was performed in a prone position. After localizing collections, skin was marked and prepped. An 18 gauge trocar needle was initially advanced under CT guidance into the posterior left pelvic abscess. Aspiration of a fluid sample was performed and fluid sent for culture analysis. A guidewire was advanced into the collection. The tract was dilated and a 10 French percutaneous drainage catheter placed. This was attached to suction bulb drainage. The catheter was flushed with saline and secured at the skin with a Prolene retention suture and StatLock device.  A  more inferior left perirectal collection was then targeted. Under CT guidance, an 18 gauge trocar needle was advanced into this collection. Aspiration was performed. A fluid sample was submitted for culture analysis.  COMPLICATIONS: None  FINDINGS: The oval-shaped posterior left pelvic fluid collection located anterior to the lower sacrum measures approximately 5.2 x 4.5 cm. Aspiration yielded grossly purulent fluid. A 10 French drain was therefore placed and will be attached to suction bulb drainage.  The smaller and more inferior collection yielded thin, bloody fluid. A sample was sent for culture analysis and labeled left perirectal fluid collection. Aspiration through an  18 gauge needle yielded 15 mL of fluid as well as some air. CT shows significant decompression after needle aspiration and given nature of fluid, a drain was not placed.  IMPRESSION: 1. Posterior left pelvic fluid collection yielded purulent fluid. A 10 French drain was placed. A sample was sent for culture analysis as left pelvic abscess. 2. The smaller left perirectal collection tracking towards the obturator region yielded thin, bloody fluid. 15 mL was able to be aspirated, resulting in significant decompression by CT. A drain was not left in this collection. A sample was sent for culture analysis as left perirectal fluid collection.   Electronically Signed   By: Irish Lack M.D.   On: 05/31/2015 17:42    Labs:  CBC:  Recent Labs  05/28/15 1747 05/30/15 0419 05/31/15 0435  WBC 18.3* 18.7* 18.1*  HGB 11.8* 9.9* 10.9*  HCT 34.7* 31.0* 33.0*  PLT 239 221 237    COAGS: No results for input(s): INR, APTT in the last 8760 hours.  BMP:  Recent Labs  05/28/15 1747  NA 136  K 3.1*  CL 97*  CO2 29  GLUCOSE 90  BUN 8  CALCIUM 9.1  CREATININE 0.46  GFRNONAA >60  GFRAA >60    LIVER FUNCTION TESTS:  Recent Labs  05/28/15 1747  BILITOT 0.6  AST 17  ALT 12*  ALKPHOS 88  PROT 8.1  ALBUMIN 3.7    Assessment and Plan: Pelvic/perirectal abscess S/p left pelvic abscess drain placement, afebrile, no new labs, Cx pending, output white/purulent, 50 cc/24 hrs S/p aspiration drainage of left perirectal fluid collection Continue to flush and follow output    Signed: Pattricia Boss D 06/01/2015, 3:34 PM   I spent a total of 15 Minutes at the the patient's bedside AND on the patient's hospital floor or unit, greater than 50% of which was counseling/coordinating care for pelvic abscess/perirectal abscess

## 2015-06-01 NOTE — Progress Notes (Signed)
4 Days Post-Op  Subjective: She is feeling a little better, pretty sore, but otherwise Ok.  Drain is purulent dark white thick drainage.  Open wound can OK , I can only see the outer site.  They are doing sitz baths.  She notes she smokes 1-2 PPD at home.    Objective: Vital signs in last 24 hours: Temp:  [98.2 F (36.8 C)-98.7 F (37.1 C)] 98.5 F (36.9 C) (09/07 0536) Pulse Rate:  [78-97] 82 (09/07 0536) Resp:  [13-26] 18 (09/07 0536) BP: (108-127)/(56-85) 124/81 mmHg (09/07 0536) SpO2:  [95 %-100 %] 100 % (09/07 0536) Last BM Date: 05/31/15 470 PO 2300 urine output Diet: clears Afebrile, VSS No labs this AM WBC 18k yesterday No growth in any culture so far. Intake/Output from previous day: 09/06 0701 - 09/07 0700 In: 1282.5 [P.O.:470; I.V.:762.5; IV Piggyback:50] Out: 2370 [Urine:2300] Intake/Output this shift:    General appearance: alert, cooperative and no distress Skin: open site OK some whitish colored drainage and white fibrous tissue at open site.    Lab Results:   Recent Labs  05/30/15 0419 05/31/15 0435  WBC 18.7* 18.1*  HGB 9.9* 10.9*  HCT 31.0* 33.0*  PLT 221 237    BMET No results for input(s): NA, K, CL, CO2, GLUCOSE, BUN, CREATININE, CALCIUM in the last 72 hours. PT/INR No results for input(s): LABPROT, INR in the last 72 hours.   Recent Labs Lab 05/28/15 1747  AST 17  ALT 12*  ALKPHOS 88  BILITOT 0.6  PROT 8.1  ALBUMIN 3.7     Lipase     Component Value Date/Time   LIPASE 10* 05/28/2015 1747     Studies/Results: Ct Abdomen Pelvis W Contrast  05/31/2015   CLINICAL DATA:  Persistent perirectal pain, recent perirectal abscess, increasing WBC  EXAM: CT ABDOMEN AND PELVIS WITH CONTRAST  TECHNIQUE: Multidetector CT imaging of the abdomen and pelvis was performed using the standard protocol following bolus administration of intravenous contrast.  CONTRAST:  OMNIPAQUE IOHEXOL 300 MG/ML  SOLN  COMPARISON:  05/28/2015  FINDINGS: There  is moderate gaseous distension of the colon best seen on scout view suspicious for colonic ileus or partial obstruction. Lung bases shows dependent atelectasis bilateral lung bases posteriorly.  Liver, pancreas, spleen and adrenal glands are unremarkable.  Moderate stool noted within cecum. Moderate gas noted within cecum. No pericecal inflammation. Normal appendix. 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  There is no small bowel obstruction. No free abdominal air is noted. Kidneys are symmetrical in size and enhancement. No hydronephrosis or hydroureter.  Delayed renal images shows bilateral renal symmetrical excretion. Bilateral visualized ureter on delayed images is unremarkable. Contrast material is noted within urinary bladder on delayed images.  Again noted thickening of rectal anal wall consistent with proctitis. There is significant narrowing of anal lumen highly suspicious for at least partial obstruction. Liquid stool and gas noted within moderate distended sigmoid colon. There is improvement from prior exam in perirectal U shaped abscess. Small amount of perirectal air is probable post instrumentation. Persistent left posterior pelvic fluid collection best seen in axial image 70 measures 6 by 4.5 cm. On the prior exam measures 6 x 4 cm. There is small amount of residual abscess left perirectal with some air-fluid level best seen in image 76 measures 4.8 by 2.5 cm. There is small abscess in left mid pelvis just medial to left acetabulum measures 4 by 1.7 cm.  There is no evidence of  septic joint. Bilateral hip joints are unremarkable. Pubic symphysis is unremarkable.  There is mild thickening of left posterior wall of the urinary bladder suspicious for satellite cystitis. Small amount of pelvic free fluid is noted. There is small amount of air in left posterior perineum probable site of recent surgery best seen in axial image 85.  Small amount of free fluid  perihepatic inferiorly see axial image 32. Normal appendix.  IMPRESSION: 1. There is colonic dilatation highly suspicious for at least partial colonic obstruction. 2.  Again noted thickening of rectal anal wall consistent with proctitis. There is significant narrowing of anal lumen highly suspicious for at least partial obstruction. Liquid stool and gas noted within moderate distended sigmoid colon. There is improvement from prior exam in perirectal U shaped abscess. Small amount of perirectal air is probable post instrumentation. Persistent left posterior pelvic fluid collection best seen in axial image 70 measures 6 by 4.5 cm. On the prior exam measures 6 x 4 cm. There is small amount of residual abscess left perirectal with some air-fluid level best seen in image 76 measures 4.8 by 2.5 cm. There is small abscess in left mid pelvis just medial to left acetabulum measures 4 by 1.7 cm.  3. Mild thickening of left posterior wall of the urinary bladder suspicious for satellite cystitis. Small amount of pelvic free fluid.   Electronically Signed   By: Natasha Mead M.D.   On: 05/31/2015 11:49   Ct Aspiration  05/31/2015   CLINICAL DATA:  Status post operative incision and drainage of large perirectal abscess 2 days ago. Followup CT performed earlier today due to persistent elevation of white blood cell count demonstrates improvement in the perirectal abscess with residual posterior left pelvic fluid collection identified as well as new collection in the left perirectal area extending superiorly into the obturator region containing fluid and air.  EXAM: 1. CT-GUIDED PERCUTANEOUS DRAINAGE OF LEFT POSTERIOR PELVIC ABSCESS WITH PLACEMENT OF INDWELLING DRAINAGE CATHETER. 2. CT-GUIDED PERCUTANEOUS NEEDLE ASPIRATION DRAINAGE OF LEFT PERIRECTAL FLUID COLLECTION.  ANESTHESIA/SEDATION: 4.0 Mg IV Versed 100 mcg IV Fentanyl  Total Moderate Sedation Time:  34 minutes  PROCEDURE: The procedure, risks, benefits, and alternatives were  explained to the patient. Questions regarding the procedure were encouraged and answered. The patient understands and consents to the procedure. A time-out was performed prior to the procedure.  The left gluteal region was prepped with Betadine in a sterile fashion, and a sterile drape was applied covering the operative field. A sterile gown and sterile gloves were used for the procedure. Local anesthesia was provided with 1% Lidocaine.  CT was performed in a prone position. After localizing collections, skin was marked and prepped. An 18 gauge trocar needle was initially advanced under CT guidance into the posterior left pelvic abscess. Aspiration of a fluid sample was performed and fluid sent for culture analysis. A guidewire was advanced into the collection. The tract was dilated and a 10 French percutaneous drainage catheter placed. This was attached to suction bulb drainage. The catheter was flushed with saline and secured at the skin with a Prolene retention suture and StatLock device.  A more inferior left perirectal collection was then targeted. Under CT guidance, an 18 gauge trocar needle was advanced into this collection. Aspiration was performed. A fluid sample was submitted for culture analysis.  COMPLICATIONS: None  FINDINGS: The oval-shaped posterior left pelvic fluid collection located anterior to the lower sacrum measures approximately 5.2 x 4.5 cm. Aspiration yielded grossly purulent fluid. A  10 French drain was therefore placed and will be attached to suction bulb drainage.  The smaller and more inferior collection yielded thin, bloody fluid. A sample was sent for culture analysis and labeled left perirectal fluid collection. Aspiration through an 18 gauge needle yielded 15 mL of fluid as well as some air. CT shows significant decompression after needle aspiration and given nature of fluid, a drain was not placed.  IMPRESSION: 1. Posterior left pelvic fluid collection yielded purulent fluid. A 10  French drain was placed. A sample was sent for culture analysis as left pelvic abscess. 2. The smaller left perirectal collection tracking towards the obturator region yielded thin, bloody fluid. 15 mL was able to be aspirated, resulting in significant decompression by CT. A drain was not left in this collection. A sample was sent for culture analysis as left perirectal fluid collection.   Electronically Signed   By: Irish Lack M.D.   On: 05/31/2015 17:42   Ct Image Guided Drainage By Percutaneous Catheter  05/31/2015   CLINICAL DATA:  Status post operative incision and drainage of large perirectal abscess 2 days ago. Followup CT performed earlier today due to persistent elevation of white blood cell count demonstrates improvement in the perirectal abscess with residual posterior left pelvic fluid collection identified as well as new collection in the left perirectal area extending superiorly into the obturator region containing fluid and air.  EXAM: 1. CT-GUIDED PERCUTANEOUS DRAINAGE OF LEFT POSTERIOR PELVIC ABSCESS WITH PLACEMENT OF INDWELLING DRAINAGE CATHETER. 2. CT-GUIDED PERCUTANEOUS NEEDLE ASPIRATION DRAINAGE OF LEFT PERIRECTAL FLUID COLLECTION.  ANESTHESIA/SEDATION: 4.0 Mg IV Versed 100 mcg IV Fentanyl  Total Moderate Sedation Time:  34 minutes  PROCEDURE: The procedure, risks, benefits, and alternatives were explained to the patient. Questions regarding the procedure were encouraged and answered. The patient understands and consents to the procedure. A time-out was performed prior to the procedure.  The left gluteal region was prepped with Betadine in a sterile fashion, and a sterile drape was applied covering the operative field. A sterile gown and sterile gloves were used for the procedure. Local anesthesia was provided with 1% Lidocaine.  CT was performed in a prone position. After localizing collections, skin was marked and prepped. An 18 gauge trocar needle was initially advanced under CT  guidance into the posterior left pelvic abscess. Aspiration of a fluid sample was performed and fluid sent for culture analysis. A guidewire was advanced into the collection. The tract was dilated and a 10 French percutaneous drainage catheter placed. This was attached to suction bulb drainage. The catheter was flushed with saline and secured at the skin with a Prolene retention suture and StatLock device.  A more inferior left perirectal collection was then targeted. Under CT guidance, an 18 gauge trocar needle was advanced into this collection. Aspiration was performed. A fluid sample was submitted for culture analysis.  COMPLICATIONS: None  FINDINGS: The oval-shaped posterior left pelvic fluid collection located anterior to the lower sacrum measures approximately 5.2 x 4.5 cm. Aspiration yielded grossly purulent fluid. A 10 French drain was therefore placed and will be attached to suction bulb drainage.  The smaller and more inferior collection yielded thin, bloody fluid. A sample was sent for culture analysis and labeled left perirectal fluid collection. Aspiration through an 18 gauge needle yielded 15 mL of fluid as well as some air. CT shows significant decompression after needle aspiration and given nature of fluid, a drain was not placed.  IMPRESSION: 1. Posterior left pelvic fluid collection  yielded purulent fluid. A 10 French drain was placed. A sample was sent for culture analysis as left pelvic abscess. 2. The smaller left perirectal collection tracking towards the obturator region yielded thin, bloody fluid. 15 mL was able to be aspirated, resulting in significant decompression by CT. A drain was not left in this collection. A sample was sent for culture analysis as left perirectal fluid collection.   Electronically Signed   By: Irish Lack M.D.   On: 05/31/2015 17:42    Medications: . Chlorhexidine Gluconate Cloth  6 each Topical Daily  . mupirocin ointment  1 application Nasal BID  .  piperacillin-tazobactam (ZOSYN)  IV  3.375 g Intravenous Q8H   . dextrose 5 % and 0.45 % NaCl with KCl 20 mEq/L 50 mL/hr at 05/31/15 1716   Prior to Admission medications   Medication Sig Start Date End Date Taking? Authorizing Provider  acetaminophen (TYLENOL) 500 MG tablet Take 1,000 mg by mouth every 6 (six) hours as needed for moderate pain.   Yes Historical Provider, MD  Aspirin-Acetaminophen-Caffeine (GOODY HEADACHE PO) Take 1 Package by mouth daily as needed (fr pain).   Yes Historical Provider, MD  furosemide (LASIX) 20 MG tablet Take 1 tablet by mouth daily as needed for fluid.  02/26/15  Yes Historical Provider, MD  lidocaine (XYLOCAINE) 2 % jelly Apply 1 application topically 2 (two) times daily as needed. Patient taking differently: Apply 1 application topically 2 (two) times daily as needed (pain).  01/25/15  Yes Jennifer Piepenbrink, PA-C  magnesium citrate ([employer] MAGNESIUM CITRATE) SOLN Take 1 Bottle by mouth once.   Yes Historical Provider, MD  phenylephrine-shark liver oil-mineral oil-petrolatum (PREPARATION H) 0.25-3-14-71.9 % rectal ointment Place 1 application rectally 2 (two) times daily as needed for hemorrhoids (hemorrhoids). pain   Yes Historical Provider, MD  polyethylene glycol powder (GLYCOLAX/MIRALAX) powder Take 17 g by mouth 2 (two) times daily. Until daily soft stools  OTC 01/25/15  Yes Jennifer Piepenbrink, PA-C  hydrocortisone (ANUSOL-HC) 25 MG suppository Place 1 suppository (25 mg total) rectally 2 (two) times daily. For 7 days Patient not taking: Reported on 05/28/2015 01/25/15   Felicie Morn, NP  lidocaine (XYLOCAINE JELLY) 2 % jelly Apply 1 application topically 2 (two) times daily as needed. Patient not taking: Reported on 01/25/2015 11/18/13   Ivonne Andrew, PA-C  polyethylene glycol powder (GLYCOLAX/MIRALAX) powder Take 17 g by mouth daily. Patient not taking: Reported on 01/25/2015 11/18/13   Ivonne Andrew, PA-C     Assessment/Plan Horseshoe perirectal abscess S/p  Incision, drainage, and open packing of horseshoe perirectal abscess, 05/29/15 Dr. Gerrit Friends S/p  CT guided drainage of posterior left pelvic abscess with drain placement (. Posterior left pelvic fluid collection yielded purulent fluid. A 10 French drain was placed. A sample was sent for culture analysis as left pelvic abscess) 05/31/15 IR CT guided needle aspiration drainage of left perirectal fluid collection (The smaller left perirectal collection tracking towards the obturator region yielded thin, bloody fluid. 15 mL was able to be aspirated, resulting in significant decompression by CT. A drain was not left in this collection.)    05/31/15, Dr. 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  Resume diet, Nicotine gum for tobacco use, continue to monitor drain, sitz baths. Her eyes are a little jaundice in appearance, she says her PCP Dr. Alto Denver has run multiple test with  no reason found for her appearance.      LOS: 3 days    Vollie Brunty 06/01/2015

## 2015-06-01 NOTE — Progress Notes (Signed)
Pharmacy Consult Note - Lovenox for VTE prophylaxis  A/P: Patient with normal renal function (CrCl > 30) and weight > 45kg. Start Lovenox  q24 and will sign off.  Hessie Knows, PharmD, BCPS Pager [phone] 06/01/2015 8:41 AM

## 2015-06-02 LAB — COMPREHENSIVE METABOLIC PANEL
ALBUMIN: 2.6 g/dL — AB (ref 3.5–5.0)
ALK PHOS: 61 U/L (ref 38–126)
ALT: 25 U/L (ref 14–54)
ANION GAP: 5 (ref 5–15)
AST: 33 U/L (ref 15–41)
BUN: 5 mg/dL — ABNORMAL LOW (ref 6–20)
CALCIUM: 8.6 mg/dL — AB (ref 8.9–10.3)
CO2: 31 mmol/L (ref 22–32)
Chloride: 102 mmol/L (ref 101–111)
Creatinine, Ser: 0.48 mg/dL (ref 0.44–1.00)
GFR calc Af Amer: >60 mL/min (ref >60)
GFR calc non Af Amer: >60 mL/min (ref >60)
GLUCOSE: 115 mg/dL — AB (ref 65–99)
Potassium: 4.3 mmol/L (ref 3.5–5.1)
SODIUM: 138 mmol/L (ref 135–145)
Total Bilirubin: 0.6 mg/dL (ref 0.3–1.2)
Total Protein: 6.4 g/dL — ABNORMAL LOW (ref 6.5–8.1)

## 2015-06-02 LAB — CULTURE, ROUTINE-ABSCESS

## 2015-06-02 LAB — CBC
HCT: 26.5 % — ABNORMAL LOW (ref 36.0–46.0)
HEMOGLOBIN: 8.6 g/dL — AB (ref 12.0–15.0)
MCH: 32.7 pg (ref 26.0–34.0)
MCHC: 32.5 g/dL (ref 30.0–36.0)
MCV: 100.8 fL — ABNORMAL HIGH (ref 78.0–100.0)
Platelets: 258 10*3/uL (ref 150–400)
RBC: 2.63 MIL/uL — AB (ref 3.87–5.11)
RDW: 13.3 % (ref 11.5–15.5)
WBC: 9.3 10*3/uL (ref 4.0–10.5)

## 2015-06-02 MED ORDER — POLYETHYLENE GLYCOL 3350 17 G PO PACK
17.0000 g | PACK | Freq: Two times a day (BID) | ORAL | Status: AC
  Administered 2015-06-02 (×2): 17 g via ORAL
  Filled 2015-06-02 (×2): qty 1

## 2015-06-02 MED ORDER — TAB-A-VITE/IRON PO TABS
1.0000 | ORAL_TABLET | Freq: Every day | ORAL | Status: DC
  Administered 2015-06-02 – 2015-06-03 (×2): 1 via ORAL
  Filled 2015-06-02 (×3): qty 1

## 2015-06-02 MED ORDER — HYDROMORPHONE HCL 1 MG/ML IJ SOLN: 0.5000 mg | INTRAMUSCULAR | Status: DC | PRN

## 2015-06-02 MED ORDER — OXYCODONE HCL 5 MG PO TABS
5.0000 mg | ORAL_TABLET | ORAL | Status: DC | PRN
Start: 2015-06-02 — End: 2015-06-04
  Administered 2015-06-02 (×2): 10 mg via ORAL
  Administered 2015-06-02: 15 mg via ORAL
  Administered 2015-06-03: 10 mg via ORAL
  Administered 2015-06-03 (×2): 15 mg via ORAL
  Administered 2015-06-03: 5 mg via ORAL
  Administered 2015-06-04: 10 mg via ORAL
  Filled 2015-06-02 (×2): qty 3
  Filled 2015-06-02 (×2): qty 2
  Filled 2015-06-02: qty 3
  Filled 2015-06-02 (×2): qty 2
  Filled 2015-06-02: qty 3
  Filled 2015-06-02: qty 2

## 2015-06-02 MED ORDER — IBUPROFEN 200 MG PO TABS: 600.0000 mg | ORAL_TABLET | Freq: Four times a day (QID) | ORAL | Status: DC | PRN

## 2015-06-02 NOTE — Progress Notes (Signed)
5 Days Post-Op  Subjective: She continues to do well.  The drainage is still a purulent fluid, but not nearly as thick and cloudy as yesterday.  Open site is still draining some purulent fluid, but it is stable.  Objective: Vital signs in last 24 hours: Temp:  [97.9 F (36.6 C)-98.7 F (37.1 C)] 97.9 F (36.6 C) (09/08 0526) Pulse Rate:  [69-92] 69 (09/08 0526) Resp:  [16] 16 (09/08 0526) BP: (112-125)/(65-79) 125/79 mmHg (09/08 0526) SpO2:  [100 %] 100 % (09/08 0526) Last BM Date: 05/31/15 1500 PO  Regular diet Drain 70 ml 1795 urine Afebrile, VSS Labs OK, H/H down some Intake/Output from previous day: 09/07 0701 - 09/08 0700 In: 2850.3 [P.O.:1500; I.V.:1170.3; IV Piggyback:150] Out: 1795 [Urine:1725; Drains:70] Intake/Output this shift:    General appearance: alert, cooperative and no distress Skin: open site is starting to close up, some white fibrous, purulent fluid at the opening.  I tried to examine with applicatior stick and I could not get it to go very far.  it is still extremely tender to exam.  Lab Results:   Recent Labs  05/31/15 0435 06/02/15 0435  WBC 18.1* 9.3  HGB 10.9* 8.6*  HCT 33.0* 26.5*  PLT 237 258    BMET  Recent Labs  06/02/15 0435  NA 138  K 4.3  CL 102  CO2 31  GLUCOSE 115*  BUN 5*  CREATININE 0.48  CALCIUM 8.6*   PT/INR No results for input(s): LABPROT, INR in the last 72 hours.   Recent Labs Lab 05/28/15 1747 06/02/15 0435  AST 17 33  ALT 12* 25  ALKPHOS 88 61  BILITOT 0.6 0.6  PROT 8.1 6.4*  ALBUMIN 3.7 2.6*     Lipase     Component Value Date/Time   LIPASE 10* 05/28/2015 1747     Studies/Results: Ct Abdomen Pelvis W Contrast  05/31/2015   CLINICAL DATA:  Persistent perirectal pain, recent perirectal abscess, increasing WBC  EXAM: CT ABDOMEN AND PELVIS WITH CONTRAST  TECHNIQUE: Multidetector CT imaging of the abdomen and pelvis was performed using the standard protocol following bolus administration of  intravenous contrast.  CONTRAST:  OMNIPAQUE IOHEXOL 300 MG/ML  SOLN  COMPARISON:  05/28/2015  FINDINGS: There is moderate gaseous distension of the colon best seen on scout view suspicious for colonic ileus or partial obstruction. Lung bases shows dependent atelectasis bilateral lung bases posteriorly.  Liver, pancreas, spleen and adrenal glands are unremarkable.  Moderate stool noted within cecum. Moderate gas noted within cecum. No pericecal inflammation. Normal appendix. 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  There is no small bowel obstruction. No free abdominal air is noted. Kidneys are symmetrical in size and enhancement. No hydronephrosis or hydroureter.  Delayed renal images shows bilateral renal symmetrical excretion. Bilateral visualized ureter on delayed images is unremarkable. Contrast material is noted within urinary bladder on delayed images.  Again noted thickening of rectal anal wall consistent with proctitis. There is significant narrowing of anal lumen highly suspicious for at least partial obstruction. Liquid stool and gas noted within moderate distended sigmoid colon. There is improvement from prior exam in perirectal U shaped abscess. Small amount of perirectal air is probable post instrumentation. Persistent left posterior pelvic fluid collection best seen in axial image 70 measures 6 by 4.5 cm. On the prior exam measures 6 x 4 cm. There is small amount of residual abscess left perirectal with some air-fluid level best seen in image 76 measures  4.8 by 2.5 cm. There is small abscess in left mid pelvis just medial to left acetabulum measures 4 by 1.7 cm.  There is no evidence of septic joint. Bilateral hip joints are unremarkable. Pubic symphysis is unremarkable.  There is mild thickening of left posterior wall of the urinary bladder suspicious for satellite cystitis. Small amount of pelvic free fluid is noted. There is small amount of air in  left posterior perineum probable site of recent surgery best seen in axial image 85.  Small amount of free fluid perihepatic inferiorly see axial image 32. Normal appendix.  IMPRESSION: 1. There is colonic dilatation highly suspicious for at least partial colonic obstruction. 2.  Again noted thickening of rectal anal wall consistent with proctitis. There is significant narrowing of anal lumen highly suspicious for at least partial obstruction. Liquid stool and gas noted within moderate distended sigmoid colon. There is improvement from prior exam in perirectal U shaped abscess. Small amount of perirectal air is probable post instrumentation. Persistent left posterior pelvic fluid collection best seen in axial image 70 measures 6 by 4.5 cm. On the prior exam measures 6 x 4 cm. There is small amount of residual abscess left perirectal with some air-fluid level best seen in image 76 measures 4.8 by 2.5 cm. There is small abscess in left mid pelvis just medial to left acetabulum measures 4 by 1.7 cm.  3. Mild thickening of left posterior wall of the urinary bladder suspicious for satellite cystitis. Small amount of pelvic free fluid.   Electronically Signed   By: Natasha Mead M.D.   On: 05/31/2015 11:49   Ct Aspiration  05/31/2015   CLINICAL DATA:  Status post operative incision and drainage of large perirectal abscess 2 days ago. Followup CT performed earlier today due to persistent elevation of white blood cell count demonstrates improvement in the perirectal abscess with residual posterior left pelvic fluid collection identified as well as new collection in the left perirectal area extending superiorly into the obturator region containing fluid and air.  EXAM: 1. CT-GUIDED PERCUTANEOUS DRAINAGE OF LEFT POSTERIOR PELVIC ABSCESS WITH PLACEMENT OF INDWELLING DRAINAGE CATHETER. 2. CT-GUIDED PERCUTANEOUS NEEDLE ASPIRATION DRAINAGE OF LEFT PERIRECTAL FLUID COLLECTION.  ANESTHESIA/SEDATION: 4.0 Mg IV Versed 100 mcg IV  Fentanyl  Total Moderate Sedation Time:  34 minutes  PROCEDURE: The procedure, risks, benefits, and alternatives were explained to the patient. Questions regarding the procedure were encouraged and answered. The patient understands and consents to the procedure. A time-out was performed prior to the procedure.  The left gluteal region was prepped with Betadine in a sterile fashion, and a sterile drape was applied covering the operative field. A sterile gown and sterile gloves were used for the procedure. Local anesthesia was provided with 1% Lidocaine.  CT was performed in a prone position. After localizing collections, skin was marked and prepped. An 18 gauge trocar needle was initially advanced under CT guidance into the posterior left pelvic abscess. Aspiration of a fluid sample was performed and fluid sent for culture analysis. A guidewire was advanced into the collection. The tract was dilated and a 10 French percutaneous drainage catheter placed. This was attached to suction bulb drainage. The catheter was flushed with saline and secured at the skin with a Prolene retention suture and StatLock device.  A more inferior left perirectal collection was then targeted. Under CT guidance, an 18 gauge trocar needle was advanced into this collection. Aspiration was performed. A fluid sample was submitted for culture analysis.  COMPLICATIONS:  None  FINDINGS: The oval-shaped posterior left pelvic fluid collection located anterior to the lower sacrum measures approximately 5.2 x 4.5 cm. Aspiration yielded grossly purulent fluid. A 10 French drain was therefore placed and will be attached to suction bulb drainage.  The smaller and more inferior collection yielded thin, bloody fluid. A sample was sent for culture analysis and labeled left perirectal fluid collection. Aspiration through an 18 gauge needle yielded 15 mL of fluid as well as some air. CT shows significant decompression after needle aspiration and given nature  of fluid, a drain was not placed.  IMPRESSION: 1. Posterior left pelvic fluid collection yielded purulent fluid. A 10 French drain was placed. A sample was sent for culture analysis as left pelvic abscess. 2. The smaller left perirectal collection tracking towards the obturator region yielded thin, bloody fluid. 15 mL was able to be aspirated, resulting in significant decompression by CT. A drain was not left in this collection. A sample was sent for culture analysis as left perirectal fluid collection.   Electronically Signed   By: Irish Lack M.D.   On: 05/31/2015 17:42   Ct Image Guided Drainage By Percutaneous Catheter  05/31/2015   CLINICAL DATA:  Status post operative incision and drainage of large perirectal abscess 2 days ago. Followup CT performed earlier today due to persistent elevation of white blood cell count demonstrates improvement in the perirectal abscess with residual posterior left pelvic fluid collection identified as well as new collection in the left perirectal area extending superiorly into the obturator region containing fluid and air.  EXAM: 1. CT-GUIDED PERCUTANEOUS DRAINAGE OF LEFT POSTERIOR PELVIC ABSCESS WITH PLACEMENT OF INDWELLING DRAINAGE CATHETER. 2. CT-GUIDED PERCUTANEOUS NEEDLE ASPIRATION DRAINAGE OF LEFT PERIRECTAL FLUID COLLECTION.  ANESTHESIA/SEDATION: 4.0 Mg IV Versed 100 mcg IV Fentanyl  Total Moderate Sedation Time:  34 minutes  PROCEDURE: The procedure, risks, benefits, and alternatives were explained to the patient. Questions regarding the procedure were encouraged and answered. The patient understands and consents to the procedure. A time-out was performed prior to the procedure.  The left gluteal region was prepped with Betadine in a sterile fashion, and a sterile drape was applied covering the operative field. A sterile gown and sterile gloves were used for the procedure. Local anesthesia was provided with 1% Lidocaine.  CT was performed in a prone position. After  localizing collections, skin was marked and prepped. An 18 gauge trocar needle was initially advanced under CT guidance into the posterior left pelvic abscess. Aspiration of a fluid sample was performed and fluid sent for culture analysis. A guidewire was advanced into the collection. The tract was dilated and a 10 French percutaneous drainage catheter placed. This was attached to suction bulb drainage. The catheter was flushed with saline and secured at the skin with a Prolene retention suture and StatLock device.  A more inferior left perirectal collection was then targeted. Under CT guidance, an 18 gauge trocar needle was advanced into this collection. Aspiration was performed. A fluid sample was submitted for culture analysis.  COMPLICATIONS: None  FINDINGS: The oval-shaped posterior left pelvic fluid collection located anterior to the lower sacrum measures approximately 5.2 x 4.5 cm. Aspiration yielded grossly purulent fluid. A 10 French drain was therefore placed and will be attached to suction bulb drainage.  The smaller and more inferior collection yielded thin, bloody fluid. A sample was sent for culture analysis and labeled left perirectal fluid collection. Aspiration through an 18 gauge needle yielded 15 mL of fluid as well  as some air. CT shows significant decompression after needle aspiration and given nature of fluid, a drain was not placed.  IMPRESSION: 1. Posterior left pelvic fluid collection yielded purulent fluid. A 10 French drain was placed. A sample was sent for culture analysis as left pelvic abscess. 2. The smaller left perirectal collection tracking towards the obturator region yielded thin, bloody fluid. 15 mL was able to be aspirated, resulting in significant decompression by CT. A drain was not left in this collection. A sample was sent for culture analysis as left perirectal fluid collection.   Electronically Signed   By: Irish Lack M.D.   On: 05/31/2015 17:42    Medications: .  Chlorhexidine Gluconate Cloth  6 each Topical Daily  . docusate sodium  100 mg Oral BID  . enoxaparin (LOVENOX) injection  40 mg Subcutaneous Q24H  . mupirocin ointment  1 application Nasal BID  . piperacillin-tazobactam (ZOSYN)  IV  3.375 g Intravenous Q8H    Assessment/Plan  Horseshoe perirectal abscess S/p Incision, drainage, and open packing of horseshoe perirectal abscess, 05/29/15 Dr. Gerrit Friends S/p CT guided drainage of posterior left pelvic abscess with drain placement (. Posterior left pelvic fluid collection yielded purulent fluid. A 10 French drain was placed. A sample was sent for culture analysis as left pelvic abscess) 05/31/15 IR CT guided needle aspiration drainage of left perirectal fluid collection (The smaller left perirectal collection tracking towards the obturator region yielded thin, bloody fluid. 15 mL was able to be aspirated, resulting in significant decompression by CT. A drain was not left in this collection.) 05/31/15, Dr. 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  Add MVI with FE for her anemia, she is already on a stool softener.  She had 11 mg of dilaudid yesterday, I will put her on oxycodone and ibuprofen PO and decrease IV dilaudid frequency.    LOS: 4 days    Stpehanie Montroy 06/02/2015

## 2015-06-02 NOTE — Progress Notes (Signed)
Patient ID: Stephanie [name], female   DOB: 07/17/1981, 34 y.o.   MRN: [id number]    Referring Physician(s): [facility]  Chief Complaint:  Perirectal/left pelvic abscess  Subjective: Patient feeling a little better today. Still has some rectal soreness. Denies nausea/ vomiting.   Allergies: Review of patient's allergies indicates no known allergies.  Medications: Prior to Admission medications   Medication Sig Start Date End Date Taking? Authorizing Provider  acetaminophen (TYLENOL) 500 MG tablet Take 1,000 mg by mouth every 6 (six) hours as needed for moderate pain.   Yes Historical Provider, MD  Aspirin-Acetaminophen-Caffeine (GOODY HEADACHE PO) Take 1 Package by mouth daily as needed (fr pain).   Yes Historical Provider, MD  furosemide (LASIX) 20 MG tablet Take 1 tablet by mouth daily as needed for fluid.  02/26/15  Yes Historical Provider, MD  lidocaine (XYLOCAINE) 2 % jelly Apply 1 application topically 2 (two) times daily as needed. Patient taking differently: Apply 1 application topically 2 (two) times daily as needed (pain).  01/25/15  Yes Jennifer Piepenbrink, PA-C  magnesium citrate ([employer] MAGNESIUM CITRATE) SOLN Take 1 Bottle by mouth once.   Yes Historical Provider, MD  phenylephrine-shark liver oil-mineral oil-petrolatum (PREPARATION H) 0.25-3-14-71.9 % rectal ointment Place 1 application rectally 2 (two) times daily as needed for hemorrhoids (hemorrhoids). pain   Yes Historical Provider, MD  polyethylene glycol powder (GLYCOLAX/MIRALAX) powder Take 17 g by mouth 2 (two) times daily. Until daily soft stools  OTC 01/25/15  Yes Jennifer Piepenbrink, PA-C  hydrocortisone (ANUSOL-HC) 25 MG suppository Place 1 suppository (25 mg total) rectally 2 (two) times daily. For 7 days Patient not taking: Reported on 05/28/2015 01/25/15   Felicie Morn, NP  lidocaine (XYLOCAINE JELLY) 2 % jelly Apply 1 application topically 2 (two) times daily as needed. Patient not taking: Reported on 01/25/2015 11/18/13    Ivonne Andrew, PA-C  polyethylene glycol powder (GLYCOLAX/MIRALAX) powder Take 17 g by mouth daily. Patient not taking: Reported on 01/25/2015 11/18/13   Ivonne Andrew, PA-C     Vital Signs: BP 125/79 mmHg  Pulse 69  Temp(Src) 97.9 F (36.6 C) (Oral)  Resp 16  Ht 5\' 6"  (1.676 m)  Wt 175 lb (79.379 kg)  BMI 28.26 kg/m2  SpO2 100%  LMP 05/19/2015  Physical Exam left transgluteal drain intact, insertion site mildly tender, output, 70 mL turbid light yellow fluid. Cultures pending.  Imaging: Ct Abdomen Pelvis W Contrast  05/31/2015   CLINICAL DATA:  Persistent perirectal pain, recent perirectal abscess, increasing WBC  EXAM: CT ABDOMEN AND PELVIS WITH CONTRAST  TECHNIQUE: Multidetector CT imaging of the abdomen and pelvis was performed using the standard protocol following bolus administration of intravenous contrast.  CONTRAST:  OMNIPAQUE IOHEXOL 300 MG/ML  SOLN  COMPARISON:  05/28/2015  FINDINGS: There is moderate gaseous distension of the colon best seen on scout view suspicious for colonic ileus or partial obstruction. Lung bases shows dependent atelectasis bilateral lung bases posteriorly.  Liver, pancreas, spleen and adrenal glands are unremarkable.  Moderate stool noted within cecum. Moderate gas noted within cecum. No pericecal inflammation. Normal appendix. 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  There is no small bowel obstruction. No free abdominal air is noted. Kidneys are symmetrical in size and enhancement. No hydronephrosis or hydroureter.  Delayed renal images shows bilateral renal symmetrical excretion. Bilateral visualized ureter on delayed images is unremarkable. Contrast material is noted within urinary bladder on delayed images.  Again noted thickening of rectal anal wall consistent  with proctitis. There is significant narrowing of anal lumen highly suspicious for at least partial obstruction. Liquid stool and gas noted within  moderate distended sigmoid colon. There is improvement from prior exam in perirectal U shaped abscess. Small amount of perirectal air is probable post instrumentation. Persistent left posterior pelvic fluid collection best seen in axial image 70 measures 6 by 4.5 cm. On the prior exam measures 6 x 4 cm. There is small amount of residual abscess left perirectal with some air-fluid level best seen in image 76 measures 4.8 by 2.5 cm. There is small abscess in left mid pelvis just medial to left acetabulum measures 4 by 1.7 cm.  There is no evidence of septic joint. Bilateral hip joints are unremarkable. Pubic symphysis is unremarkable.  There is mild thickening of left posterior wall of the urinary bladder suspicious for satellite cystitis. Small amount of pelvic free fluid is noted. There is small amount of air in left posterior perineum probable site of recent surgery best seen in axial image 85.  Small amount of free fluid perihepatic inferiorly see axial image 32. Normal appendix.  IMPRESSION: 1. There is colonic dilatation highly suspicious for at least partial colonic obstruction. 2.  Again noted thickening of rectal anal wall consistent with proctitis. There is significant narrowing of anal lumen highly suspicious for at least partial obstruction. Liquid stool and gas noted within moderate distended sigmoid colon. There is improvement from prior exam in perirectal U shaped abscess. Small amount of perirectal air is probable post instrumentation. Persistent left posterior pelvic fluid collection best seen in axial image 70 measures 6 by 4.5 cm. On the prior exam measures 6 x 4 cm. There is small amount of residual abscess left perirectal with some air-fluid level best seen in image 76 measures 4.8 by 2.5 cm. There is small abscess in left mid pelvis just medial to left acetabulum measures 4 by 1.7 cm.  3. Mild thickening of left posterior wall of the urinary bladder suspicious for satellite cystitis. Small amount  of pelvic free fluid.   Electronically Signed   By: Natasha Mead M.D.   On: 05/31/2015 11:49   Ct Aspiration  05/31/2015   CLINICAL DATA:  Status post operative incision and drainage of large perirectal abscess 2 days ago. Followup CT performed earlier today due to persistent elevation of white blood cell count demonstrates improvement in the perirectal abscess with residual posterior left pelvic fluid collection identified as well as new collection in the left perirectal area extending superiorly into the obturator region containing fluid and air.  EXAM: 1. CT-GUIDED PERCUTANEOUS DRAINAGE OF LEFT POSTERIOR PELVIC ABSCESS WITH PLACEMENT OF INDWELLING DRAINAGE CATHETER. 2. CT-GUIDED PERCUTANEOUS NEEDLE ASPIRATION DRAINAGE OF LEFT PERIRECTAL FLUID COLLECTION.  ANESTHESIA/SEDATION: 4.0 Mg IV Versed 100 mcg IV Fentanyl  Total Moderate Sedation Time:  34 minutes  PROCEDURE: The procedure, risks, benefits, and alternatives were explained to the patient. Questions regarding the procedure were encouraged and answered. The patient understands and consents to the procedure. A time-out was performed prior to the procedure.  The left gluteal region was prepped with Betadine in a sterile fashion, and a sterile drape was applied covering the operative field. A sterile gown and sterile gloves were used for the procedure. Local anesthesia was provided with 1% Lidocaine.  CT was performed in a prone position. After localizing collections, skin was marked and prepped. An 18 gauge trocar needle was initially advanced under CT guidance into the posterior left pelvic abscess. Aspiration of a  fluid sample was performed and fluid sent for culture analysis. A guidewire was advanced into the collection. The tract was dilated and a 10 French percutaneous drainage catheter placed. This was attached to suction bulb drainage. The catheter was flushed with saline and secured at the skin with a Prolene retention suture and StatLock device.  A  more inferior left perirectal collection was then targeted. Under CT guidance, an 18 gauge trocar needle was advanced into this collection. Aspiration was performed. A fluid sample was submitted for culture analysis.  COMPLICATIONS: None  FINDINGS: The oval-shaped posterior left pelvic fluid collection located anterior to the lower sacrum measures approximately 5.2 x 4.5 cm. Aspiration yielded grossly purulent fluid. A 10 French drain was therefore placed and will be attached to suction bulb drainage.  The smaller and more inferior collection yielded thin, bloody fluid. A sample was sent for culture analysis and labeled left perirectal fluid collection. Aspiration through an 18 gauge needle yielded 15 mL of fluid as well as some air. CT shows significant decompression after needle aspiration and given nature of fluid, a drain was not placed.  IMPRESSION: 1. Posterior left pelvic fluid collection yielded purulent fluid. A 10 French drain was placed. A sample was sent for culture analysis as left pelvic abscess. 2. The smaller left perirectal collection tracking towards the obturator region yielded thin, bloody fluid. 15 mL was able to be aspirated, resulting in significant decompression by CT. A drain was not left in this collection. A sample was sent for culture analysis as left perirectal fluid collection.   Electronically Signed   By: Irish Lack M.D.   On: 05/31/2015 17:42   Ct Image Guided Drainage By Percutaneous Catheter  05/31/2015   CLINICAL DATA:  Status post operative incision and drainage of large perirectal abscess 2 days ago. Followup CT performed earlier today due to persistent elevation of white blood cell count demonstrates improvement in the perirectal abscess with residual posterior left pelvic fluid collection identified as well as new collection in the left perirectal area extending superiorly into the obturator region containing fluid and air.  EXAM: 1. CT-GUIDED PERCUTANEOUS DRAINAGE OF  LEFT POSTERIOR PELVIC ABSCESS WITH PLACEMENT OF INDWELLING DRAINAGE CATHETER. 2. CT-GUIDED PERCUTANEOUS NEEDLE ASPIRATION DRAINAGE OF LEFT PERIRECTAL FLUID COLLECTION.  ANESTHESIA/SEDATION: 4.0 Mg IV Versed 100 mcg IV Fentanyl  Total Moderate Sedation Time:  34 minutes  PROCEDURE: The procedure, risks, benefits, and alternatives were explained to the patient. Questions regarding the procedure were encouraged and answered. The patient understands and consents to the procedure. A time-out was performed prior to the procedure.  The left gluteal region was prepped with Betadine in a sterile fashion, and a sterile drape was applied covering the operative field. A sterile gown and sterile gloves were used for the procedure. Local anesthesia was provided with 1% Lidocaine.  CT was performed in a prone position. After localizing collections, skin was marked and prepped. An 18 gauge trocar needle was initially advanced under CT guidance into the posterior left pelvic abscess. Aspiration of a fluid sample was performed and fluid sent for culture analysis. A guidewire was advanced into the collection. The tract was dilated and a 10 French percutaneous drainage catheter placed. This was attached to suction bulb drainage. The catheter was flushed with saline and secured at the skin with a Prolene retention suture and StatLock device.  A more inferior left perirectal collection was then targeted. Under CT guidance, an 18 gauge trocar needle was advanced into this collection. Aspiration  was performed. A fluid sample was submitted for culture analysis.  COMPLICATIONS: None  FINDINGS: The oval-shaped posterior left pelvic fluid collection located anterior to the lower sacrum measures approximately 5.2 x 4.5 cm. Aspiration yielded grossly purulent fluid. A 10 French drain was therefore placed and will be attached to suction bulb drainage.  The smaller and more inferior collection yielded thin, bloody fluid. A sample was sent for  culture analysis and labeled left perirectal fluid collection. Aspiration through an 18 gauge needle yielded 15 mL of fluid as well as some air. CT shows significant decompression after needle aspiration and given nature of fluid, a drain was not placed.  IMPRESSION: 1. Posterior left pelvic fluid collection yielded purulent fluid. A 10 French drain was placed. A sample was sent for culture analysis as left pelvic abscess. 2. The smaller left perirectal collection tracking towards the obturator region yielded thin, bloody fluid. 15 mL was able to be aspirated, resulting in significant decompression by CT. A drain was not left in this collection. A sample was sent for culture analysis as left perirectal fluid collection.   Electronically Signed   By: Irish Lack M.D.   On: 05/31/2015 17:42    Labs:  CBC:  Recent Labs  05/28/15 1747 05/30/15 0419 05/31/15 0435 06/02/15 0435  WBC 18.3* 18.7* 18.1* 9.3  HGB 11.8* 9.9* 10.9* 8.6*  HCT 34.7* 31.0* 33.0* 26.5*  PLT 239 221 237 258    COAGS: No results for input(s): INR, APTT in the last 8760 hours.  BMP:  Recent Labs  05/28/15 1747 06/02/15 0435  NA 136 138  K 3.1* 4.3  CL 97* 102  CO2 29 31  GLUCOSE 90 115*  BUN 8 5*  CALCIUM 9.1 8.6*  CREATININE 0.46 0.48  GFRNONAA >60 >60  GFRAA >60 >60    LIVER FUNCTION TESTS:  Recent Labs  05/28/15 1747 06/02/15 0435  BILITOT 0.6 0.6  AST 17 33  ALT 12* 25  ALKPHOS 88 61  PROT 8.1 6.4*  ALBUMIN 3.7 2.6*    Assessment and Plan: Patient with history of large perirectal abscess status post recent I and D 05/29/15 as well as drainage of left pelvic fluid collection and aspiration of a left perirectal fluid collection on 05/31/15; patient afebrile, white blood cell count and creatinine normal, hemoglobin 8.6. Check final fluid cultures. Once drain output significantly decreases, obtain follow-up CT and/or drain injection. Additional plans as per surgery.    Signed: D. Jeananne Rama 06/02/2015, 10:56 AM   I spent a total of 15 minutes at the the patient's bedside AND on the patient's hospital floor or unit, greater than 50% of which was counseling/coordinating care for left pelvic fluid collection drain

## 2015-06-03 ENCOUNTER — Inpatient Hospital Stay (HOSPITAL_COMMUNITY): Payer: Medicaid Other

## 2015-06-03 ENCOUNTER — Encounter: Payer: Self-pay | Admitting: General Surgery

## 2015-06-03 LAB — ANAEROBIC CULTURE

## 2015-06-03 MED ORDER — SACCHAROMYCES BOULARDII 250 MG PO CAPS
250.0000 mg | ORAL_CAPSULE | Freq: Two times a day (BID) | ORAL | Status: DC
  Administered 2015-06-03 – 2015-06-04 (×2): 250 mg via ORAL
  Filled 2015-06-03 (×2): qty 1

## 2015-06-03 MED ORDER — IOHEXOL 300 MG/ML  SOLN
150.0000 mL | Freq: Once | INTRAMUSCULAR | Status: DC | PRN
  Administered 2015-06-03: 50 mL
  Filled 2015-06-03: qty 150

## 2015-06-03 MED ORDER — BISACODYL 10 MG RE SUPP: 10.0000 mg | Freq: Every day | RECTAL | Status: DC | PRN

## 2015-06-03 MED ORDER — IOHEXOL 300 MG/ML  SOLN
100.0000 mL | Freq: Once | INTRAMUSCULAR | Status: AC | PRN
  Administered 2015-06-03: 100 mL via INTRAVENOUS

## 2015-06-03 NOTE — Discharge Instructions (Signed)
Peri-Rectal Abscess  Your caregiver has diagnosed you as having a peri-rectal abscess. This is an infected area near the rectum that is filled with pus. If the abscess is near the surface of the skin, your caregiver may open (incise) the area and drain the pus.  HOME CARE INSTRUCTIONS    If your abscess was opened up and drained. A small piece of gauze may be placed in the opening so that it can drain. Do not remove the gauze unless directed by your caregiver.   A loose dressing may be placed over the abscess site. Change the dressing as often as necessary to keep it clean and dry.   After the drain is removed, the area may be washed with a gentle antiseptic (soap) four times per day.   A warm sitz bath, warm packs or heating pad may be used for pain relief, taking care not to burn yourself.   Return for a wound check in 1 day or as directed.   An "inflatable doughnut" may be used for sitting with added comfort. These can be purchased at a drugstore or medical supply house.   To reduce pain and straining with bowel movements, eat a high fiber diet with plenty of fruits and vegetables. Use stool softeners as recommended by your caregiver. This is especially important if narcotic type pain medications were prescribed as these may cause marked constipation.   Only take over-the-counter or prescription medicines for pain, discomfort, or fever as directed by your caregiver.  SEEK IMMEDIATE MEDICAL CARE IF:    You have increasing pain that is not controlled by medication.   There is increased inflammation (redness), swelling, bleeding, or drainage from the area.   An oral temperature above 102 F (38.9 C) develops.   You develop chills or generalized malaise (feel lethargic or feel "washed out").   You develop any new symptoms (problems) you feel may be related to your present problem.  Document Released: 09/07/2000 Document Revised: 12/03/2011 Document Reviewed: 09/07/2008  [employer] Patient Information  2015 [employer]. This information is not intended to replace advice given to you by your health care provider. Make sure you discuss any questions you have with your health care provider.

## 2015-06-03 NOTE — Progress Notes (Signed)
CT scan today shows:  Persistent thickening in the distal rectum stable from the prior exam.  The rounded fluid collection recently drained is decompressed by the catheter.  There is a persistent left pelvic air-fluid collection which is stable in appearance when compared with the prior study. The drain study shows no fistula.  IR did not pull the drain because it had only been there for 3 days.  They did not feel the other fluid collection required drainage.

## 2015-06-03 NOTE — Progress Notes (Signed)
6 Days Post-Op  Subjective: She feels great, drainage is down and clear today.    Objective: Vital signs in last 24 hours: Temp:  [97.4 F (36.3 C)-98 F (36.7 C)] 98 F (36.7 C) (09/09 0615) Pulse Rate:  [82-87] 82 (09/09 0615) Resp:  [16-21] 16 (09/09 0615) BP: (114-118)/(65-91) 114/91 mmHg (09/09 0615) SpO2:  [100 %] 100 % (09/09 0615) Last BM Date: 05/31/15 40 from drain recorded for yesterday:  regular diet 5600 urine No BM Intake/Output from previous day: 09/08 0701 - 09/09 0700 In: 660 [P.O.:480; IV Piggyback:150] Out: 5640 [Urine:5600; Drains:40] Intake/Output this shift:    General appearance: alert, cooperative and no distress GI: soft, non-tender; bowel sounds normal; no masses,  no organomegaly Skin: site looks fine drainage is clear.  Lab Results:   Recent Labs  06/02/15 0435  WBC 9.3  HGB 8.6*  HCT 26.5*  PLT 258    BMET  Recent Labs  06/02/15 0435  NA 138  K 4.3  CL 102  CO2 31  GLUCOSE 115*  BUN 5*  CREATININE 0.48  CALCIUM 8.6*   PT/INR No results for input(s): LABPROT, INR in the last 72 hours.   Recent Labs Lab 05/28/15 1747 06/02/15 0435  AST 17 33  ALT 12* 25  ALKPHOS 88 61  BILITOT 0.6 0.6  PROT 8.1 6.4*  ALBUMIN 3.7 2.6*     Lipase     Component Value Date/Time   LIPASE 10* 05/28/2015 1747     Studies/Results: No results found.  Medications: . Chlorhexidine Gluconate Cloth  6 each Topical Daily  . docusate sodium  100 mg Oral BID  . enoxaparin (LOVENOX) injection  40 mg Subcutaneous Q24H  . multivitamins with iron  1 tablet Oral Daily  . mupirocin ointment  1 application Nasal BID  . piperacillin-tazobactam (ZOSYN)  IV  3.375 g Intravenous Q8H    Assessment/Plan Horseshoe perirectal abscess S/p Incision, drainage, and open packing of horseshoe perirectal abscess, 05/29/15 Dr. Gerrit Friends S/p CT guided drainage of posterior left pelvic abscess with drain placement (. Posterior left pelvic fluid collection  yielded purulent fluid. A 10 French drain was placed. A sample was sent for culture analysis as left pelvic abscess) 05/31/15 IR CT guided needle aspiration drainage of left perirectal fluid collection (The smaller left perirectal collection tracking towards the obturator region yielded thin, bloody fluid. 15 mL was able to be aspirated, resulting in significant decompression by CT. A drain was not left in this collection.) 05/31/15, Dr. 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     LOS: 5 days    Stephanie [name] 06/03/2015

## 2015-06-03 NOTE — Progress Notes (Signed)
Patient ID: Stephanie [name], female   DOB: Apr 03, 1981, 34 y.o.   MRN: [id number]    Referring Physician(s): [facility]  Chief Complaint:  Perirectal/left pelvic abscess  Subjective:  Pt without new c/o  Allergies: Review of patient's allergies indicates no known allergies.  Medications: Prior to Admission medications   Medication Sig Start Date End Date Taking? Authorizing Provider  acetaminophen (TYLENOL) 500 MG tablet Take 1,000 mg by mouth every 6 (six) hours as needed for moderate pain.   Yes Historical Provider, MD  Aspirin-Acetaminophen-Caffeine (GOODY HEADACHE PO) Take 1 Package by mouth daily as needed (fr pain).   Yes Historical Provider, MD  furosemide (LASIX) 20 MG tablet Take 1 tablet by mouth daily as needed for fluid.  02/26/15  Yes Historical Provider, MD  lidocaine (XYLOCAINE) 2 % jelly Apply 1 application topically 2 (two) times daily as needed. Patient taking differently: Apply 1 application topically 2 (two) times daily as needed (pain).  01/25/15  Yes Jennifer Piepenbrink, PA-C  magnesium citrate ([employer] MAGNESIUM CITRATE) SOLN Take 1 Bottle by mouth once.   Yes Historical Provider, MD  phenylephrine-shark liver oil-mineral oil-petrolatum (PREPARATION H) 0.25-3-14-71.9 % rectal ointment Place 1 application rectally 2 (two) times daily as needed for hemorrhoids (hemorrhoids). pain   Yes Historical Provider, MD  polyethylene glycol powder (GLYCOLAX/MIRALAX) powder Take 17 g by mouth 2 (two) times daily. Until daily soft stools  OTC 01/25/15  Yes Jennifer Piepenbrink, PA-C  hydrocortisone (ANUSOL-HC) 25 MG suppository Place 1 suppository (25 mg total) rectally 2 (two) times daily. For 7 days Patient not taking: Reported on 05/28/2015 01/25/15   Felicie Morn, NP  lidocaine (XYLOCAINE JELLY) 2 % jelly Apply 1 application topically 2 (two) times daily as needed. Patient not taking: Reported on 01/25/2015 11/18/13   Ivonne Andrew, PA-C  polyethylene glycol powder (GLYCOLAX/MIRALAX) powder Take  17 g by mouth daily. Patient not taking: Reported on 01/25/2015 11/18/13   Ivonne Andrew, PA-C     Vital Signs: BP 110/65 mmHg  Pulse 76  Temp(Src) 98 F (36.7 C) (Oral)  Resp 16  Ht 5\' 6"  (1.676 m)  Wt 175 lb (79.379 kg)  BMI 28.26 kg/m2  SpO2 100%  LMP 05/19/2015  Physical Exam left pelvic drain intact, dressing dry, site mildly tender, output 40 cc slightly turbid, yellow fluid; fluid cx's neg to date  Imaging: Ct Abdomen Pelvis W Contrast  06/03/2015   CLINICAL DATA:  History  Rectal abscess with drainage catheter  EXAM: CT ABDOMEN AND PELVIS WITH CONTRAST  TECHNIQUE: Multidetector CT imaging of the abdomen and pelvis was performed using the standard protocol following bolus administration of intravenous contrast.  CONTRAST:  OMNIPAQUE IOHEXOL 300 MG/ML  SOLN  COMPARISON:  05/31/2015  FINDINGS: The lung bases are free of acute infiltrate or sizable effusion.  The liver, spleen, gallbladder, adrenal glands and pancreas are all normal in their CT appearance and stable from the prior exam. The kidneys demonstrate a normal enhancement pattern bilaterally. The appendix is air-filled and within normal limits. No significant colonic dilatation is seen. Contrast material is noted within the colon from the previous CT.  The bladder is partially distended. Persistent wall thickening within the distal rectum is noted. The previously seen fluid collection the has been decompressed by the drainage catheter although a few smaller collections are again noted adjacent to the distal rectum on the left. These remain stable in appearance when compared with the prior exam. There best seen on images 71 through 77 of series 2.  The largest component of this collection measures approximately 5.2 cm and is roughly stable. No new focal abscess is seen. No acute bony abnormality is noted.  IMPRESSION: Persistent thickening in the distal rectum stable from the prior exam.  The rounded fluid collection recently drained  is decompressed by the catheter.  There is a persistent left pelvic air-fluid collection which is stable in appearance when compared with the prior study.  The remainder of the exam is stable from the prior study.   Electronically Signed   By: Alcide Clever M.D.   On: 06/03/2015 11:34   Ct Abdomen Pelvis W Contrast  05/31/2015   CLINICAL DATA:  Persistent perirectal pain, recent perirectal abscess, increasing WBC  EXAM: CT ABDOMEN AND PELVIS WITH CONTRAST  TECHNIQUE: Multidetector CT imaging of the abdomen and pelvis was performed using the standard protocol following bolus administration of intravenous contrast.  CONTRAST:  OMNIPAQUE IOHEXOL 300 MG/ML  SOLN  COMPARISON:  05/28/2015  FINDINGS: There is moderate gaseous distension of the colon best seen on scout view suspicious for colonic ileus or partial obstruction. Lung bases shows dependent atelectasis bilateral lung bases posteriorly.  Liver, pancreas, spleen and adrenal glands are unremarkable.  Moderate stool noted within cecum. Moderate gas noted within cecum. No pericecal inflammation. Normal appendix. 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  There is no small bowel obstruction. No free abdominal air is noted. Kidneys are symmetrical in size and enhancement. No hydronephrosis or hydroureter.  Delayed renal images shows bilateral renal symmetrical excretion. Bilateral visualized ureter on delayed images is unremarkable. Contrast material is noted within urinary bladder on delayed images.  Again noted thickening of rectal anal wall consistent with proctitis. There is significant narrowing of anal lumen highly suspicious for at least partial obstruction. Liquid stool and gas noted within moderate distended sigmoid colon. There is improvement from prior exam in perirectal U shaped abscess. Small amount of perirectal air is probable post instrumentation. Persistent left posterior pelvic fluid collection best  seen in axial image 70 measures 6 by 4.5 cm. On the prior exam measures 6 x 4 cm. There is small amount of residual abscess left perirectal with some air-fluid level best seen in image 76 measures 4.8 by 2.5 cm. There is small abscess in left mid pelvis just medial to left acetabulum measures 4 by 1.7 cm.  There is no evidence of septic joint. Bilateral hip joints are unremarkable. Pubic symphysis is unremarkable.  There is mild thickening of left posterior wall of the urinary bladder suspicious for satellite cystitis. Small amount of pelvic free fluid is noted. There is small amount of air in left posterior perineum probable site of recent surgery best seen in axial image 85.  Small amount of free fluid perihepatic inferiorly see axial image 32. Normal appendix.  IMPRESSION: 1. There is colonic dilatation highly suspicious for at least partial colonic obstruction. 2.  Again noted thickening of rectal anal wall consistent with proctitis. There is significant narrowing of anal lumen highly suspicious for at least partial obstruction. Liquid stool and gas noted within moderate distended sigmoid colon. There is improvement from prior exam in perirectal U shaped abscess. Small amount of perirectal air is probable post instrumentation. Persistent left posterior pelvic fluid collection best seen in axial image 70 measures 6 by 4.5 cm. On the prior exam measures 6 x 4 cm. There is small amount of residual abscess left perirectal with some air-fluid level best seen in image 76 measures  4.8 by 2.5 cm. There is small abscess in left mid pelvis just medial to left acetabulum measures 4 by 1.7 cm.  3. Mild thickening of left posterior wall of the urinary bladder suspicious for satellite cystitis. Small amount of pelvic free fluid.   Electronically Signed   By: Natasha Mead M.D.   On: 05/31/2015 11:49   Ct Aspiration  05/31/2015   CLINICAL DATA:  Status post operative incision and drainage of large perirectal abscess 2 days ago.  Followup CT performed earlier today due to persistent elevation of white blood cell count demonstrates improvement in the perirectal abscess with residual posterior left pelvic fluid collection identified as well as new collection in the left perirectal area extending superiorly into the obturator region containing fluid and air.  EXAM: 1. CT-GUIDED PERCUTANEOUS DRAINAGE OF LEFT POSTERIOR PELVIC ABSCESS WITH PLACEMENT OF INDWELLING DRAINAGE CATHETER. 2. CT-GUIDED PERCUTANEOUS NEEDLE ASPIRATION DRAINAGE OF LEFT PERIRECTAL FLUID COLLECTION.  ANESTHESIA/SEDATION: 4.0 Mg IV Versed 100 mcg IV Fentanyl  Total Moderate Sedation Time:  34 minutes  PROCEDURE: The procedure, risks, benefits, and alternatives were explained to the patient. Questions regarding the procedure were encouraged and answered. The patient understands and consents to the procedure. A time-out was performed prior to the procedure.  The left gluteal region was prepped with Betadine in a sterile fashion, and a sterile drape was applied covering the operative field. A sterile gown and sterile gloves were used for the procedure. Local anesthesia was provided with 1% Lidocaine.  CT was performed in a prone position. After localizing collections, skin was marked and prepped. An 18 gauge trocar needle was initially advanced under CT guidance into the posterior left pelvic abscess. Aspiration of a fluid sample was performed and fluid sent for culture analysis. A guidewire was advanced into the collection. The tract was dilated and a 10 French percutaneous drainage catheter placed. This was attached to suction bulb drainage. The catheter was flushed with saline and secured at the skin with a Prolene retention suture and StatLock device.  A more inferior left perirectal collection was then targeted. Under CT guidance, an 18 gauge trocar needle was advanced into this collection. Aspiration was performed. A fluid sample was submitted for culture analysis.   COMPLICATIONS: None  FINDINGS: The oval-shaped posterior left pelvic fluid collection located anterior to the lower sacrum measures approximately 5.2 x 4.5 cm. Aspiration yielded grossly purulent fluid. A 10 French drain was therefore placed and will be attached to suction bulb drainage.  The smaller and more inferior collection yielded thin, bloody fluid. A sample was sent for culture analysis and labeled left perirectal fluid collection. Aspiration through an 18 gauge needle yielded 15 mL of fluid as well as some air. CT shows significant decompression after needle aspiration and given nature of fluid, a drain was not placed.  IMPRESSION: 1. Posterior left pelvic fluid collection yielded purulent fluid. A 10 French drain was placed. A sample was sent for culture analysis as left pelvic abscess. 2. The smaller left perirectal collection tracking towards the obturator region yielded thin, bloody fluid. 15 mL was able to be aspirated, resulting in significant decompression by CT. A drain was not left in this collection. A sample was sent for culture analysis as left perirectal fluid collection.   Electronically Signed   By: Irish Lack M.D.   On: 05/31/2015 17:42   Ct Image Guided Drainage By Percutaneous Catheter  05/31/2015   CLINICAL DATA:  Status post operative incision and drainage of large  perirectal abscess 2 days ago. Followup CT performed earlier today due to persistent elevation of white blood cell count demonstrates improvement in the perirectal abscess with residual posterior left pelvic fluid collection identified as well as new collection in the left perirectal area extending superiorly into the obturator region containing fluid and air.  EXAM: 1. CT-GUIDED PERCUTANEOUS DRAINAGE OF LEFT POSTERIOR PELVIC ABSCESS WITH PLACEMENT OF INDWELLING DRAINAGE CATHETER. 2. CT-GUIDED PERCUTANEOUS NEEDLE ASPIRATION DRAINAGE OF LEFT PERIRECTAL FLUID COLLECTION.  ANESTHESIA/SEDATION: 4.0 Mg IV Versed 100 mcg IV  Fentanyl  Total Moderate Sedation Time:  34 minutes  PROCEDURE: The procedure, risks, benefits, and alternatives were explained to the patient. Questions regarding the procedure were encouraged and answered. The patient understands and consents to the procedure. A time-out was performed prior to the procedure.  The left gluteal region was prepped with Betadine in a sterile fashion, and a sterile drape was applied covering the operative field. A sterile gown and sterile gloves were used for the procedure. Local anesthesia was provided with 1% Lidocaine.  CT was performed in a prone position. After localizing collections, skin was marked and prepped. An 18 gauge trocar needle was initially advanced under CT guidance into the posterior left pelvic abscess. Aspiration of a fluid sample was performed and fluid sent for culture analysis. A guidewire was advanced into the collection. The tract was dilated and a 10 French percutaneous drainage catheter placed. This was attached to suction bulb drainage. The catheter was flushed with saline and secured at the skin with a Prolene retention suture and StatLock device.  A more inferior left perirectal collection was then targeted. Under CT guidance, an 18 gauge trocar needle was advanced into this collection. Aspiration was performed. A fluid sample was submitted for culture analysis.  COMPLICATIONS: None  FINDINGS: The oval-shaped posterior left pelvic fluid collection located anterior to the lower sacrum measures approximately 5.2 x 4.5 cm. Aspiration yielded grossly purulent fluid. A 10 French drain was therefore placed and will be attached to suction bulb drainage.  The smaller and more inferior collection yielded thin, bloody fluid. A sample was sent for culture analysis and labeled left perirectal fluid collection. Aspiration through an 18 gauge needle yielded 15 mL of fluid as well as some air. CT shows significant decompression after needle aspiration and given nature  of fluid, a drain was not placed.  IMPRESSION: 1. Posterior left pelvic fluid collection yielded purulent fluid. A 10 French drain was placed. A sample was sent for culture analysis as left pelvic abscess. 2. The smaller left perirectal collection tracking towards the obturator region yielded thin, bloody fluid. 15 mL was able to be aspirated, resulting in significant decompression by CT. A drain was not left in this collection. A sample was sent for culture analysis as left perirectal fluid collection.   Electronically Signed   By: Irish Lack M.D.   On: 05/31/2015 17:42    Labs:  CBC:  Recent Labs  05/28/15 1747 05/30/15 0419 05/31/15 0435 06/02/15 0435  WBC 18.3* 18.7* 18.1* 9.3  HGB 11.8* 9.9* 10.9* 8.6*  HCT 34.7* 31.0* 33.0* 26.5*  PLT 239 221 237 258    COAGS: No results for input(s): INR, APTT in the last 8760 hours.  BMP:  Recent Labs  05/28/15 1747 06/02/15 0435  NA 136 138  K 3.1* 4.3  CL 97* 102  CO2 29 31  GLUCOSE 90 115*  BUN 8 5*  CALCIUM 9.1 8.6*  CREATININE 0.46 0.48  GFRNONAA >60 >60  GFRAA >60 >60    LIVER FUNCTION TESTS:  Recent Labs  05/28/15 1747 06/02/15 0435  BILITOT 0.6 0.6  AST 17 33  ALT 12* 25  ALKPHOS 88 61  PROT 8.1 6.4*  ALBUMIN 3.7 2.6*    Assessment and Plan: Patient with history of large perirectal abscess status post recent I and D 05/29/15 as well as drainage of left pelvic fluid collection and aspiration of a left perirectal fluid collection on 05/31/15; patient afebrile; drain fluid cultures neg to date; f/u CT today with decompressed drained collection, persistent collections in left pelvis stable; drain injection today with no fistula noted; cont drain for now until [facility]CS reevaluates   Signed: D. Jeananne Rama 06/03/2015, 4:03 PM   I spent a total of 15 minutes at the the patient's bedside AND on the patient's hospital floor or unit, greater than 50% of which was counseling/coordinating care for left pelvic fluid  collection drain

## 2015-06-04 LAB — CULTURE, ROUTINE-ABSCESS
CULTURE: NO GROWTH
Culture: NO GROWTH
SPECIAL REQUESTS: NORMAL
SPECIAL REQUESTS: NORMAL

## 2015-06-04 LAB — CBC
HEMATOCRIT: 28.4 % — AB (ref 36.0–46.0)
HEMOGLOBIN: 9.2 g/dL — AB (ref 12.0–15.0)
MCH: 32.9 pg (ref 26.0–34.0)
MCHC: 32.4 g/dL (ref 30.0–36.0)
MCV: 101.4 fL — AB (ref 78.0–100.0)
Platelets: 309 10*3/uL (ref 150–400)
RBC: 2.8 MIL/uL — ABNORMAL LOW (ref 3.87–5.11)
RDW: 13.4 % (ref 11.5–15.5)
WBC: 9.7 10*3/uL (ref 4.0–10.5)

## 2015-06-04 LAB — BASIC METABOLIC PANEL
ANION GAP: 9 (ref 5–15)
BUN: 10 mg/dL (ref 6–20)
CHLORIDE: 102 mmol/L (ref 101–111)
CO2: 26 mmol/L (ref 22–32)
CREATININE: 0.57 mg/dL (ref 0.44–1.00)
Calcium: 9 mg/dL (ref 8.9–10.3)
GFR calc non Af Amer: >60 mL/min (ref >60)
GLUCOSE: 98 mg/dL (ref 65–99)
Potassium: 4.6 mmol/L (ref 3.5–5.1)
Sodium: 137 mmol/L (ref 135–145)

## 2015-06-04 MED ORDER — AMOXICILLIN-POT CLAVULANATE 875-125 MG PO TABS: 1.0000 | ORAL_TABLET | Freq: Two times a day (BID) | ORAL | Status: DC

## 2015-06-04 MED ORDER — AMOXICILLIN-POT CLAVULANATE 875-125 MG PO TABS
1.0000 | ORAL_TABLET | Freq: Two times a day (BID) | ORAL | Status: DC
  Administered 2015-06-04: 1 via ORAL
  Filled 2015-06-04: qty 1

## 2015-06-04 MED ORDER — OXYCODONE HCL 5 MG PO TABS: 5.0000 mg | ORAL_TABLET | Freq: Four times a day (QID) | ORAL | Status: DC | PRN

## 2015-06-04 NOTE — Progress Notes (Signed)
7 Days Post-Op  Subjective: She feels great, drainage is down and clear today. Denies any perirectal pain   Objective: Vital signs in last 24 hours: Temp:  [97.4 F (36.3 C)-98.1 F (36.7 C)] 98.1 F (36.7 C) (09/10 0525) Pulse Rate:  [66-76] 67 (09/10 0525) Resp:  [16] 16 (09/09 2056) BP: (110-127)/(65-76) 116/70 mmHg (09/10 0525) SpO2:  [100 %] 100 % (09/10 0525) Last BM Date: 05/31/15 40 from drain recorded for yesterday:  regular diet 5600 urine No BM Intake/Output from previous day: 09/09 0701 - 09/10 0700 In: 720 [P.O.:720] Out: 600 [Urine:600] Intake/Output this shift:    General appearance: alert, cooperative and no distress GI: soft, non-tender; bowel sounds normal; no masses,  no organomegaly Skin: abscess site looks fine.  JP drainage is clear.  Lab Results:   Recent Labs  06/02/15 0435 06/04/15 0445  WBC 9.3 9.7  HGB 8.6* 9.2*  HCT 26.5* 28.4*  PLT 258 309    BMET  Recent Labs  06/02/15 0435 06/04/15 0445  NA 138 137  K 4.3 4.6  CL 102 102  CO2 31 26  GLUCOSE 115* 98  BUN 5* 10  CREATININE 0.48 0.57  CALCIUM 8.6* 9.0   PT/INR No results for input(s): LABPROT, INR in the last 72 hours.   Recent Labs Lab 05/28/15 1747 06/02/15 0435  AST 17 33  ALT 12* 25  ALKPHOS 88 61  BILITOT 0.6 0.6  PROT 8.1 6.4*  ALBUMIN 3.7 2.6*     Lipase     Component Value Date/Time   LIPASE 10* 05/28/2015 1747     Studies/Results: Ct Abdomen Pelvis W Contrast  06/03/2015   CLINICAL DATA:  History  Rectal abscess with drainage catheter  EXAM: CT ABDOMEN AND PELVIS WITH CONTRAST  TECHNIQUE: Multidetector CT imaging of the abdomen and pelvis was performed using the standard protocol following bolus administration of intravenous contrast.  CONTRAST:  OMNIPAQUE IOHEXOL 300 MG/ML  SOLN  COMPARISON:  05/31/2015  FINDINGS: The lung bases are free of acute infiltrate or sizable effusion.  The liver, spleen, gallbladder, adrenal glands and pancreas are all  normal in their CT appearance and stable from the prior exam. The kidneys demonstrate a normal enhancement pattern bilaterally. The appendix is air-filled and within normal limits. No significant colonic dilatation is seen. Contrast material is noted within the colon from the previous CT.  The bladder is partially distended. Persistent wall thickening within the distal rectum is noted. The previously seen fluid collection the has been decompressed by the drainage catheter although a few smaller collections are again noted adjacent to the distal rectum on the left. These remain stable in appearance when compared with the prior exam. There best seen on images 71 through 77 of series 2. The largest component of this collection measures approximately 5.2 cm and is roughly stable. No new focal abscess is seen. No acute bony abnormality is noted.  IMPRESSION: Persistent thickening in the distal rectum stable from the prior exam.  The rounded fluid collection recently drained is decompressed by the catheter.  There is a persistent left pelvic air-fluid collection which is stable in appearance when compared with the prior study.  The remainder of the exam is stable from the prior study.   Electronically Signed   By: Alcide Clever M.D.   On: 06/03/2015 11:34   Dg Sinus/fist Tube Chk-non Gi  06/03/2015   CLINICAL DATA:  Evaluate pelvic abscess drain.  EXAM: ABSCESS CATHETER INJECTION  CONTRAST:  10 mL OMNIPAQUE IOHEXOL 300 MG/ML  SOLN  FINDINGS: Fluoroscopy Time (in minutes and seconds):  39 second  COMPARISON:  CT 06/03/2015  FINDINGS: Patient was placed prone on the fluoroscopic table. The drain was injected with contrast. Contrast fills a small collection around the catheter. There is no drainage into adjacent bowel structures or additional fluid collections. All of the contrast was able to be aspirated.  IMPRESSION: Small cavity associated with the drainage catheter. No fistula connection to bowel.   Electronically  Signed   By: Richarda Overlie M.D.   On: 06/03/2015 17:29    Medications: . amoxicillin-clavulanate  1 tablet Oral Q12H  . Chlorhexidine Gluconate Cloth  6 each Topical Daily  . docusate sodium  100 mg Oral BID  . enoxaparin (LOVENOX) injection  40 mg Subcutaneous Q24H  . multivitamins with iron  1 tablet Oral Daily  . saccharomyces boulardii  250 mg Oral BID    Assessment/Plan Horseshoe perirectal abscess S/p Incision, drainage, and open packing of horseshoe perirectal abscess, 05/29/15 Dr. Gerrit Friends S/p CT guided drainage of posterior left pelvic abscess with drain placement (. Posterior left pelvic fluid collection yielded purulent fluid. A 10 French drain was placed. A sample was sent for culture analysis as left pelvic abscess) 05/31/15 IR CT guided needle aspiration drainage of left perirectal fluid collection (The smaller left perirectal collection tracking towards the obturator region yielded thin, bloody fluid. 15 mL was able to be aspirated, resulting in significant decompression by CT. A drain was not left in this collection.) 05/31/15, Dr. 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  Will d/c drain and d/c home     LOS: 6 days    Nidya Bouyer C. 06/04/2015

## 2015-06-04 NOTE — Progress Notes (Signed)
Referring Physician(s): Dr. Derrell Lolling  Chief Complaint:  Perirectal/left pelvic abscess  Subjective:  Stephanie [name] is doing well today.  She is eager to have her drain removed and go home. She has no complaints  Gen Surg note seen. Drain ready to be removed.  Allergies: Review of patient's allergies indicates no known allergies.  Medications: Prior to Admission medications   Medication Sig Start Date End Date Taking? Authorizing Provider  acetaminophen (TYLENOL) 500 MG tablet Take 1,000 mg by mouth every 6 (six) hours as needed for moderate pain.   Yes Historical Provider, MD  Aspirin-Acetaminophen-Caffeine (GOODY HEADACHE PO) Take 1 Package by mouth daily as needed (fr pain).   Yes Historical Provider, MD  furosemide (LASIX) 20 MG tablet Take 1 tablet by mouth daily as needed for fluid.  02/26/15  Yes Historical Provider, MD  lidocaine (XYLOCAINE) 2 % jelly Apply 1 application topically 2 (two) times daily as needed. Patient taking differently: Apply 1 application topically 2 (two) times daily as needed (pain).  01/25/15  Yes Jennifer Piepenbrink, PA-C  magnesium citrate ([employer] MAGNESIUM CITRATE) SOLN Take 1 Bottle by mouth once.   Yes Historical Provider, MD  phenylephrine-shark liver oil-mineral oil-petrolatum (PREPARATION H) 0.25-3-14-71.9 % rectal ointment Place 1 application rectally 2 (two) times daily as needed for hemorrhoids (hemorrhoids). pain   Yes Historical Provider, MD  polyethylene glycol powder (GLYCOLAX/MIRALAX) powder Take 17 g by mouth 2 (two) times daily. Until daily soft stools  OTC 01/25/15  Yes Jennifer Piepenbrink, PA-C  amoxicillin-clavulanate (AUGMENTIN) 875-125 MG per tablet Take 1 tablet by mouth every 12 (twelve) hours. 06/04/15   Romie Levee, MD  hydrocortisone (ANUSOL-HC) 25 MG suppository Place 1 suppository (25 mg total) rectally 2 (two) times daily. For 7 days Patient not taking: Reported on 05/28/2015 01/25/15   Felicie Morn, NP  lidocaine (XYLOCAINE JELLY) 2 %  jelly Apply 1 application topically 2 (two) times daily as needed. Patient not taking: Reported on 01/25/2015 11/18/13   Ivonne Andrew, PA-C  oxyCODONE (OXY IR/ROXICODONE) 5 MG immediate release tablet Take 1-2 tablets (5-10 mg total) by mouth every 6 (six) hours as needed for moderate pain or severe pain. 06/04/15   Romie Levee, MD  polyethylene glycol powder (GLYCOLAX/MIRALAX) powder Take 17 g by mouth daily. Patient not taking: Reported on 01/25/2015 11/18/13   Ivonne Andrew, PA-C     Vital Signs: BP 116/70 mmHg  Pulse 67  Temp(Src) 98.1 F (36.7 C) (Oral)  Resp 16  Ht 5\' 6"  (1.676 m)  Wt 175 lb (79.379 kg)  BMI 28.26 kg/m2  SpO2 100%  LMP 05/19/2015  Physical Exam  Up and ambulating in room. Drain intact.  No measurable drainage in bulb.  Dressing removed, suture clipped, drain cut and easily removed intact, silk suture removed.   Patient tolerated drain removal well with no pain at all.  Imaging: Ct Abdomen Pelvis W Contrast  06/03/2015   CLINICAL DATA:  History  Rectal abscess with drainage catheter  EXAM: CT ABDOMEN AND PELVIS WITH CONTRAST  TECHNIQUE: Multidetector CT imaging of the abdomen and pelvis was performed using the standard protocol following bolus administration of intravenous contrast.  CONTRAST:  OMNIPAQUE IOHEXOL 300 MG/ML  SOLN  COMPARISON:  05/31/2015  FINDINGS: The lung bases are free of acute infiltrate or sizable effusion.  The liver, spleen, gallbladder, adrenal glands and pancreas are all normal in their CT appearance and stable from the prior exam. The kidneys demonstrate a normal enhancement pattern bilaterally. The appendix is air-filled  and within normal limits. No significant colonic dilatation is seen. Contrast material is noted within the colon from the previous CT.  The bladder is partially distended. Persistent wall thickening within the distal rectum is noted. The previously seen fluid collection the has been decompressed by the drainage catheter  although a few smaller collections are again noted adjacent to the distal rectum on the left. These remain stable in appearance when compared with the prior exam. There best seen on images 71 through 77 of series 2. The largest component of this collection measures approximately 5.2 cm and is roughly stable. No new focal abscess is seen. No acute bony abnormality is noted.  IMPRESSION: Persistent thickening in the distal rectum stable from the prior exam.  The rounded fluid collection recently drained is decompressed by the catheter.  There is a persistent left pelvic air-fluid collection which is stable in appearance when compared with the prior study.  The remainder of the exam is stable from the prior study.   Electronically Signed   By: Alcide Clever M.D.   On: 06/03/2015 11:34   Ct Aspiration  05/31/2015   CLINICAL DATA:  Status post operative incision and drainage of large perirectal abscess 2 days ago. Followup CT performed earlier today due to persistent elevation of white blood cell count demonstrates improvement in the perirectal abscess with residual posterior left pelvic fluid collection identified as well as new collection in the left perirectal area extending superiorly into the obturator region containing fluid and air.  EXAM: 1. CT-GUIDED PERCUTANEOUS DRAINAGE OF LEFT POSTERIOR PELVIC ABSCESS WITH PLACEMENT OF INDWELLING DRAINAGE CATHETER. 2. CT-GUIDED PERCUTANEOUS NEEDLE ASPIRATION DRAINAGE OF LEFT PERIRECTAL FLUID COLLECTION.  ANESTHESIA/SEDATION: 4.0 Mg IV Versed 100 mcg IV Fentanyl  Total Moderate Sedation Time:  34 minutes  PROCEDURE: The procedure, risks, benefits, and alternatives were explained to the patient. Questions regarding the procedure were encouraged and answered. The patient understands and consents to the procedure. A time-out was performed prior to the procedure.  The left gluteal region was prepped with Betadine in a sterile fashion, and a sterile drape was applied covering the  operative field. A sterile gown and sterile gloves were used for the procedure. Local anesthesia was provided with 1% Lidocaine.  CT was performed in a prone position. After localizing collections, skin was marked and prepped. An 18 gauge trocar needle was initially advanced under CT guidance into the posterior left pelvic abscess. Aspiration of a fluid sample was performed and fluid sent for culture analysis. A guidewire was advanced into the collection. The tract was dilated and a 10 French percutaneous drainage catheter placed. This was attached to suction bulb drainage. The catheter was flushed with saline and secured at the skin with a Prolene retention suture and StatLock device.  A more inferior left perirectal collection was then targeted. Under CT guidance, an 18 gauge trocar needle was advanced into this collection. Aspiration was performed. A fluid sample was submitted for culture analysis.  COMPLICATIONS: None  FINDINGS: The oval-shaped posterior left pelvic fluid collection located anterior to the lower sacrum measures approximately 5.2 x 4.5 cm. Aspiration yielded grossly purulent fluid. A 10 French drain was therefore placed and will be attached to suction bulb drainage.  The smaller and more inferior collection yielded thin, bloody fluid. A sample was sent for culture analysis and labeled left perirectal fluid collection. Aspiration through an 18 gauge needle yielded 15 mL of fluid as well as some air. CT shows significant decompression after needle aspiration  and given nature of fluid, a drain was not placed.  IMPRESSION: 1. Posterior left pelvic fluid collection yielded purulent fluid. A 10 French drain was placed. A sample was sent for culture analysis as left pelvic abscess. 2. The smaller left perirectal collection tracking towards the obturator region yielded thin, bloody fluid. 15 mL was able to be aspirated, resulting in significant decompression by CT. A drain was not left in this  collection. A sample was sent for culture analysis as left perirectal fluid collection.   Electronically Signed   By: Glenn  Irish Lack.   On: 05/31/2015 17:42   Dg Sinus/fist Tube Chk-non Gi  06/03/2015   CLINICAL DATA:  Evaluate pelvic abscess drain.  EXAM: ABSCESS CATHETER INJECTION  CONTRAST:  10 mL OMNIPAQUE IOHEXOL 300 MG/ML  SOLN  FINDINGS: Fluoroscopy Time (in minutes and seconds):  39 second  COMPARISON:  CT 06/03/2015  FINDINGS: Patient was placed prone on the fluoroscopic table. The drain was injected with contrast. Contrast fills a small collection around the catheter. There is no drainage into adjacent bowel structures or additional fluid collections. All of the contrast was able to be aspirated.  IMPRESSION: Small cavity associated with the drainage catheter. No fistula connection to bowel.   Electronically Signed   By: Richarda Overlie M.D.   On: 06/03/2015 17:29   Ct Image Guided Drainage By Percutaneous Catheter  05/31/2015   CLINICAL DATA:  Status post operative incision and drainage of large perirectal abscess 2 days ago. Followup CT performed earlier today due to persistent elevation of white blood cell count demonstrates improvement in the perirectal abscess with residual posterior left pelvic fluid collection identified as well as new collection in the left perirectal area extending superiorly into the obturator region containing fluid and air.  EXAM: 1. CT-GUIDED PERCUTANEOUS DRAINAGE OF LEFT POSTERIOR PELVIC ABSCESS WITH PLACEMENT OF INDWELLING DRAINAGE CATHETER. 2. CT-GUIDED PERCUTANEOUS NEEDLE ASPIRATION DRAINAGE OF LEFT PERIRECTAL FLUID COLLECTION.  ANESTHESIA/SEDATION: 4.0 Mg IV Versed 100 mcg IV Fentanyl  Total Moderate Sedation Time:  34 minutes  PROCEDURE: The procedure, risks, benefits, and alternatives were explained to the patient. Questions regarding the procedure were encouraged and answered. The patient understands and consents to the procedure. A time-out was performed prior to  the procedure.  The left gluteal region was prepped with Betadine in a sterile fashion, and a sterile drape was applied covering the operative field. A sterile gown and sterile gloves were used for the procedure. Local anesthesia was provided with 1% Lidocaine.  CT was performed in a prone position. After localizing collections, skin was marked and prepped. An 18 gauge trocar needle was initially advanced under CT guidance into the posterior left pelvic abscess. Aspiration of a fluid sample was performed and fluid sent for culture analysis. A guidewire was advanced into the collection. The tract was dilated and a 10 French percutaneous drainage catheter placed. This was attached to suction bulb drainage. The catheter was flushed with saline and secured at the skin with a Prolene retention suture and StatLock device.  A more inferior left perirectal collection was then targeted. Under CT guidance, an 18 gauge trocar needle was advanced into this collection. Aspiration was performed. A fluid sample was submitted for culture analysis.  COMPLICATIONS: None  FINDINGS: The oval-shaped posterior left pelvic fluid collection located anterior to the lower sacrum measures approximately 5.2 x 4.5 cm. Aspiration yielded grossly purulent fluid. A 10 French drain was therefore placed and will be attached to suction bulb drainage.  The smaller and more inferior collection yielded thin, bloody fluid. A sample was sent for culture analysis and labeled left perirectal fluid collection. Aspiration through an 18 gauge needle yielded 15 mL of fluid as well as some air. CT shows significant decompression after needle aspiration and given nature of fluid, a drain was not placed.  IMPRESSION: 1. Posterior left pelvic fluid collection yielded purulent fluid. A 10 French drain was placed. A sample was sent for culture analysis as left pelvic abscess. 2. The smaller left perirectal collection tracking towards the obturator region yielded  thin, bloody fluid. 15 mL was able to be aspirated, resulting in significant decompression by CT. A drain was not left in this collection. A sample was sent for culture analysis as left perirectal fluid collection.   Electronically Signed   By: Irish Lack M.D.   On: 05/31/2015 17:42    Labs:  CBC:  Recent Labs  05/30/15 0419 05/31/15 0435 06/02/15 0435 06/04/15 0445  WBC 18.7* 18.1* 9.3 9.7  HGB 9.9* 10.9* 8.6* 9.2*  HCT 31.0* 33.0* 26.5* 28.4*  PLT 221 237 258 309    COAGS: No results for input(s): INR, APTT in the last 8760 hours.  BMP:  Recent Labs  05/28/15 1747 06/02/15 0435 06/04/15 0445  NA 136 138 137  K 3.1* 4.3 4.6  CL 97* 102 102  CO2 29 31 26   GLUCOSE 90 115* 98  BUN 8 5* 10  CALCIUM 9.1 8.6* 9.0  CREATININE 0.46 0.48 0.57  GFRNONAA >60 >60 >60  GFRAA >60 >60 >60    LIVER FUNCTION TESTS:  Recent Labs  05/28/15 1747 06/02/15 0435  BILITOT 0.6 0.6  AST 17 33  ALT 12* 25  ALKPHOS 88 61  PROT 8.1 6.4*  ALBUMIN 3.7 2.6*    Assessment and Plan:  Patient with history of large perirectal abscess status post recent I and D 05/29/15 as well as drainage of left pelvic fluid collection and aspiration of a left perirectal fluid collection on 05/31/15;   F/U CT yesterday with decompressed drained collection, persistent collections in left pelvis stable  Drain injection yesterday with no fistula noted  Drain easily removed today with no complications  Agree with D/C home  Signed: Gwynneth Macleod PA-C 06/04/2015, 1:35 PM   I spent a total of 15 Minutes at the the patient's bedside AND on the patient's hospital floor or unit, greater than 50% of which was counseling/coordinating care for s/p drain placement

## 2015-06-05 LAB — ANAEROBIC CULTURE
Special Requests: NORMAL
Special Requests: NORMAL

## 2015-06-05 NOTE — Progress Notes (Signed)
Patient discharged to home with family, discharge instructions reviewed with patient who verbalized understanding. 

## 2015-06-06 NOTE — Discharge Summary (Signed)
Physician Discharge Summary  Patient ID: Stephanie [name] MRN: [id number] DOB/AGE: Apr 10, 1981 34 y.o.  Admit date: 05/28/2015 Discharge date: 06/06/2015  Admission Diagnoses:   Discharge Diagnoses:  Principal Problem:   Perirectal abscess   PROCEDURES: 1.   IRRIGATION AND DEBRIDEMENT PERIRECTAL ABSCESS, 05/29/15, Dr. Darnell Level 2.  IR aspiration and placement of drain rectal abscess, 05/31/15, IR  Hospital Course:  34 yo BF with one week history of pelvic pain. Seen by primary MD and thought to have hemorrhoids. Referral made to surgery for one month. Patient developed worsening pain, fever, sweats and constipation. Presents to ER. WBC elevated at 18K. CT scan shows large "horseshoe" type perirectal abscesses. Surgery called for urgent management.  She was admitted by Dr. Gerrit Friends, and taken to the OR the following AM.  She did well post op.  Her diet was advance and the packing was removed on 05/30/15.  WBC was still up the following day and CT scan was repeated.  CT on 05/31/15 showed ongoing colon dilatation and possible obstruction and 2 fluid collections one 6 x 4.5 cm and a second 4.8 x 2.5.  A drain was place in the larger collection with drainage of of purulent fluid.  She was placed on Sitz baths, continued on Zosyn post op.  Her diet was advanced and she made good progress. The open wound was not repacked The drainage from the IR drain place on 05/31/15 went from clearly purulent to clear.  Repeat CT and IR injection of the sinus tract on 9/9?16 showed the abscess was well drained with no further fluid, the second collection was not different.  WBC was back to normal and stable.  The drain was pulled on 06/04/15, by Dr. Maisie Fus and she was sent home on Augmentin.   She will follow up with Dr. Gerrit Friends in 2 weeks.  Follow up as below.  Condition on D/C:  Improved.  CBC Latest Ref Rng 06/04/2015 06/02/2015 05/31/2015  WBC 4.0 - 10.5 K/uL 9.7 9.3 18.1(H)  Hemoglobin 12.0 - 15.0 g/dL 0.8(M) 5.7(Q)  10.9(L)  Hematocrit 36.0 - 46.0 % 28.4(L) 26.5(L) 33.0(L)  Platelets 150 - 400 K/uL 309 258 237     CMP Latest Ref Rng 06/04/2015 06/02/2015 05/28/2015  Glucose 65 - 99 mg/dL 98 469(G) 90  BUN 6 - 20 mg/dL 10 5(L) 8  Creatinine 2.95 - 1.00 mg/dL 2.84 1.32 4.40  Sodium 135 - 145 mmol/L 137 138 136  Potassium 3.5 - 5.1 mmol/L 4.6 4.3 3.1(L)  Chloride 101 - 111 mmol/L 102 102 97(L)  CO2 22 - 32 mmol/L 26 31 29   Calcium 8.9 - 10.3 mg/dL 9.0 1.0(U) 9.1  Total Protein 6.5 - 8.1 g/dL - 6.4(L) 8.1  Total Bilirubin 0.3 - 1.2 mg/dL - 0.6 0.6  Alkaline Phos 38 - 126 U/L - 61 88  AST 15 - 41 U/L - 33 17  ALT 14 - 54 U/L - 25 12(L)    Cultures from 05/31/15 revealed no growth.      Disposition: 01-Home or Self Care     Medication List    TAKE these medications        acetaminophen 500 MG tablet  Commonly known as:  TYLENOL  Take 1,000 mg by mouth every 6 (six) hours as needed for moderate pain.     amoxicillin-clavulanate 875-125 MG per tablet  Commonly known as:  AUGMENTIN  Take 1 tablet by mouth every 12 (twelve) hours.     [employer] MAGNESIUM CITRATE Soln  Generic drug:  magnesium citrate  Take 1 Bottle by mouth once.     furosemide 20 MG tablet  Commonly known as:  LASIX  Take 1 tablet by mouth daily as needed for fluid.     GOODY HEADACHE PO  Take 1 Package by mouth daily as needed (fr pain).     hydrocortisone 25 MG suppository  Commonly known as:  ANUSOL-HC  Place 1 suppository (25 mg total) rectally 2 (two) times daily. For 7 days     lidocaine 2 % jelly  Commonly known as:  XYLOCAINE JELLY  Apply 1 application topically 2 (two) times daily as needed.     lidocaine 2 % jelly  Commonly known as:  XYLOCAINE  Apply 1 application topically 2 (two) times daily as needed.     oxyCODONE 5 MG immediate release tablet  Commonly known as:  Oxy IR/ROXICODONE  Take 1-2 tablets (5-10 mg total) by mouth every 6 (six) hours as needed for moderate pain or severe pain.      polyethylene glycol powder powder  Commonly known as:  GLYCOLAX/MIRALAX  Take 17 g by mouth daily.     polyethylene glycol powder powder  Commonly known as:  GLYCOLAX/MIRALAX  Take 17 g by mouth 2 (two) times daily. Until daily soft stools  OTC     PREPARATION H 0.25-3-14-71.9 % rectal ointment  Generic drug:  phenylephrine-shark liver oil-mineral oil-petrolatum  Place 1 application rectally 2 (two) times daily as needed for hemorrhoids (hemorrhoids). pain           Follow-up Information    Follow up with Velora Heckler, MD. Schedule an appointment as soon as possible for a visit in 2 weeks.   Specialty:  General Surgery   Contact information:   [address] [place] [address] [phone]       Signed: Sherrie George 06/06/2015, 3:21 PM

## 2016-11-29 ENCOUNTER — Other Ambulatory Visit (HOSPITAL_COMMUNITY): Payer: Self-pay | Admitting: Specialist

## 2016-11-29 DIAGNOSIS — R7989 Other specified abnormal findings of blood chemistry: Secondary | ICD-10-CM

## 2016-12-12 ENCOUNTER — Encounter (HOSPITAL_COMMUNITY)
Admission: RE | Admit: 2016-12-12 | Discharge: 2016-12-12 | Disposition: A | Discharge status: Home / Self Care | Payer: Medicaid Other | Source: Ambulatory Visit | Attending: Specialist | Admitting: Specialist

## 2016-12-12 DIAGNOSIS — R7989 Other specified abnormal findings of blood chemistry: Secondary | ICD-10-CM

## 2016-12-12 DIAGNOSIS — R946 Abnormal results of thyroid function studies: Secondary | ICD-10-CM | POA: Insufficient documentation

## 2016-12-13 ENCOUNTER — Encounter (HOSPITAL_COMMUNITY)
Admission: RE | Admit: 2016-12-13 | Discharge: 2016-12-13 | Disposition: A | Discharge status: Home / Self Care | Payer: Medicaid Other | Source: Ambulatory Visit | Attending: Specialist | Admitting: Specialist

## 2016-12-13 DIAGNOSIS — R946 Abnormal results of thyroid function studies: Secondary | ICD-10-CM | POA: Diagnosis present

## 2017-03-21 ENCOUNTER — Inpatient Hospital Stay (HOSPITAL_COMMUNITY)
Admission: EM | Admit: 2017-03-21 | Discharge: 2017-03-25 | DRG: 758 | Disposition: A | Discharge status: Home / Self Care | Payer: Medicaid Other | Attending: General Surgery | Admitting: General Surgery

## 2017-03-21 ENCOUNTER — Emergency Department (HOSPITAL_COMMUNITY): Payer: Medicaid Other

## 2017-03-21 ENCOUNTER — Encounter (HOSPITAL_COMMUNITY): Payer: Self-pay | Admitting: *Deleted

## 2017-03-21 DIAGNOSIS — F17210 Nicotine dependence, cigarettes, uncomplicated: Secondary | ICD-10-CM | POA: Diagnosis present

## 2017-03-21 DIAGNOSIS — Z98891 History of uterine scar from previous surgery: Secondary | ICD-10-CM

## 2017-03-21 DIAGNOSIS — N739 Female pelvic inflammatory disease, unspecified: Principal | ICD-10-CM | POA: Diagnosis present

## 2017-03-21 DIAGNOSIS — F410 Panic disorder [episodic paroxysmal anxiety] without agoraphobia: Secondary | ICD-10-CM | POA: Diagnosis present

## 2017-03-21 DIAGNOSIS — K611 Rectal abscess: Secondary | ICD-10-CM | POA: Diagnosis present

## 2017-03-21 LAB — CBC WITH DIFFERENTIAL/PLATELET
Basophils Absolute: 0 10*3/uL (ref 0.0–0.1)
Basophils Relative: 0 %
Eosinophils Absolute: 0 10*3/uL (ref 0.0–0.7)
Eosinophils Relative: 0 %
HCT: 35.8 % — ABNORMAL LOW (ref 36.0–46.0)
Hemoglobin: 12 g/dL (ref 12.0–15.0)
Lymphocytes Relative: 20 %
Lymphs Abs: 2.1 10*3/uL (ref 0.7–4.0)
MCH: 33.2 pg (ref 26.0–34.0)
MCHC: 33.5 g/dL (ref 30.0–36.0)
MCV: 99.2 fL (ref 78.0–100.0)
Monocytes Absolute: 0.8 10*3/uL (ref 0.1–1.0)
Monocytes Relative: 8 %
Neutro Abs: 7.4 10*3/uL (ref 1.7–7.7)
Neutrophils Relative %: 72 %
Platelets: 141 10*3/uL — ABNORMAL LOW (ref 150–400)
RBC: 3.61 MIL/uL — ABNORMAL LOW (ref 3.87–5.11)
RDW: 13 % (ref 11.5–15.5)
WBC: 10.4 10*3/uL (ref 4.0–10.5)

## 2017-03-21 LAB — PREGNANCY, URINE: Preg Test, Ur: NEGATIVE

## 2017-03-21 LAB — URINALYSIS, ROUTINE W REFLEX MICROSCOPIC
Bilirubin Urine: NEGATIVE
Glucose, UA: NEGATIVE mg/dL
Hgb urine dipstick: NEGATIVE
Ketones, ur: 5 mg/dL — AB
Nitrite: NEGATIVE
Protein, ur: NEGATIVE mg/dL
Specific Gravity, Urine: 1.021 (ref 1.005–1.030)
pH: 6 (ref 5.0–8.0)

## 2017-03-21 LAB — BASIC METABOLIC PANEL
Anion gap: 6 (ref 5–15)
BUN: 7 mg/dL (ref 6–20)
CO2: 26 mmol/L (ref 22–32)
Calcium: 8.6 mg/dL — ABNORMAL LOW (ref 8.9–10.3)
Chloride: 106 mmol/L (ref 101–111)
Creatinine, Ser: 0.59 mg/dL (ref 0.44–1.00)
GFR calc Af Amer: >60 mL/min (ref >60)
GFR calc non Af Amer: >60 mL/min (ref >60)
Glucose, Bld: 83 mg/dL (ref 65–99)
Potassium: 3.7 mmol/L (ref 3.5–5.1)
Sodium: 138 mmol/L (ref 135–145)

## 2017-03-21 MED ORDER — KETOROLAC TROMETHAMINE 15 MG/ML IJ SOLN
15.0000 mg | Freq: Once | INTRAMUSCULAR | Status: AC
  Administered 2017-03-21: 15 mg via INTRAVENOUS
  Filled 2017-03-21: qty 1

## 2017-03-21 MED ORDER — HYDROMORPHONE HCL 1 MG/ML IJ SOLN
0.5000 mg | INTRAMUSCULAR | Status: DC | PRN
  Administered 2017-03-21 (×2): 1 mg via INTRAVENOUS
  Filled 2017-03-21 (×2): qty 1

## 2017-03-21 MED ORDER — PIPERACILLIN-TAZOBACTAM 3.375 G IVPB
3.3750 g | Freq: Three times a day (TID) | INTRAVENOUS | Status: DC
  Administered 2017-03-21 – 2017-03-25 (×12): 3.375 g via INTRAVENOUS
  Filled 2017-03-21 (×13): qty 50

## 2017-03-21 MED ORDER — MORPHINE SULFATE (PF) 4 MG/ML IV SOLN
6.0000 mg | Freq: Once | INTRAVENOUS | Status: AC
  Administered 2017-03-21: 6 mg via INTRAVENOUS
  Filled 2017-03-21: qty 2

## 2017-03-21 MED ORDER — HYDROMORPHONE HCL 1 MG/ML IJ SOLN: 1.0000 mg | Freq: Once | INTRAMUSCULAR | Status: DC

## 2017-03-21 MED ORDER — METRONIDAZOLE IN NACL 5-0.79 MG/ML-% IV SOLN
500.0000 mg | Freq: Once | INTRAVENOUS | Status: AC
  Administered 2017-03-21: 500 mg via INTRAVENOUS
  Filled 2017-03-21: qty 100

## 2017-03-21 MED ORDER — ONDANSETRON HCL 4 MG/2ML IJ SOLN
4.0000 mg | Freq: Four times a day (QID) | INTRAMUSCULAR | Status: DC | PRN
  Administered 2017-03-21: 4 mg via INTRAVENOUS
  Filled 2017-03-21: qty 2

## 2017-03-21 MED ORDER — DEXTROSE 5 % IV SOLN
1.0000 g | Freq: Once | INTRAVENOUS | Status: AC
  Administered 2017-03-21: 1 g via INTRAVENOUS
  Filled 2017-03-21: qty 10

## 2017-03-21 MED ORDER — IOPAMIDOL (ISOVUE-300) INJECTION 61%
INTRAVENOUS | Status: AC
  Administered 2017-03-21: 100 mL
  Filled 2017-03-21: qty 100

## 2017-03-21 MED ORDER — SODIUM CHLORIDE 0.9 % IV BOLUS (SEPSIS)
1000.0000 mL | Freq: Once | INTRAVENOUS | Status: AC
  Administered 2017-03-21: 1000 mL via INTRAVENOUS

## 2017-03-21 MED ORDER — HYDROMORPHONE HCL 4 MG/ML IJ SOLN
1.0000 mg | Freq: Once | INTRAMUSCULAR | Status: AC
  Administered 2017-03-21: 1 mg via INTRAVENOUS
  Filled 2017-03-21: qty 1

## 2017-03-21 MED ORDER — METOPROLOL TARTRATE 5 MG/5ML IV SOLN: 5.0000 mg | Freq: Four times a day (QID) | INTRAVENOUS | Status: DC | PRN

## 2017-03-21 MED ORDER — HYDROMORPHONE HCL 1 MG/ML IJ SOLN
1.0000 mg | INTRAMUSCULAR | Status: DC | PRN
  Administered 2017-03-21 – 2017-03-22 (×8): 1 mg via INTRAVENOUS
  Filled 2017-03-21 (×8): qty 1

## 2017-03-21 MED ORDER — PANTOPRAZOLE SODIUM 40 MG IV SOLR
40.0000 mg | Freq: Every day | INTRAVENOUS | Status: DC
  Administered 2017-03-21: 40 mg via INTRAVENOUS
  Filled 2017-03-21: qty 40

## 2017-03-21 MED ORDER — KCL IN DEXTROSE-NACL 20-5-0.45 MEQ/L-%-% IV SOLN
INTRAVENOUS | Status: DC
  Administered 2017-03-21 – 2017-03-25 (×8): via INTRAVENOUS
  Filled 2017-03-21 (×9): qty 1000

## 2017-03-21 MED ORDER — PROMETHAZINE HCL 25 MG/ML IJ SOLN
12.5000 mg | INTRAMUSCULAR | Status: DC | PRN
  Administered 2017-03-21 – 2017-03-24 (×5): 12.5 mg via INTRAVENOUS
  Filled 2017-03-21 (×6): qty 1

## 2017-03-21 NOTE — Progress Notes (Signed)
Referring Physician(s): Rosenbower,T  Supervising Physician: Gilmer Mor  Patient Status:  [facility] - In-pt  Chief Complaint: Abdominal/rectal pain  Subjective: Patient familiar to IR service from prior left posterior pelvic abscess drain placement on 05/31/15. She underwent I and D of a perirectal abscess on 05/28/15. Past medical history also significant for depression, schizophrenia, anxiety. She presented to the hospital today with three-day history of persistent rectal pain as well as nausea. She does have a history of internal hemorrhoids. In addition the patient has had occasional sweats, headache, intermittent coughing, but no profuse rectal bleeding. CT of the abdomen and pelvis today revealed 2 pelvic abscesses, largest associated with posterior wall the rectum and other in the left posterior pelvis. Current labs include WBC 10.4, hemoglobin 12, platelets 141k, creatinine normal. Request now received from surgery for CT-guided aspiration/drainage of the above-mentioned abscesses. Past Medical History:  Diagnosis Date  . Anemia   . Depression   . Diabetes mellitus    gestational  . H/O cesarean section 2010  . Hemorrhoids   . Panic attacks   . Schizophrenia (HCC)   . Vaginal delivery 2004 , 2008, 2009   Past Surgical History:  Procedure Laterality Date  . CESAREAN SECTION    . INCISION AND DRAINAGE PERIRECTAL ABSCESS N/A 05/28/2015   Procedure: IRRIGATION AND DEBRIDEMENT PERIRECTAL ABSCESS;  Surgeon: Darnell Level, MD;  Location: [facility]S;  Service: General;  Laterality: N/A;  . TUBAL LIGATION  2010      Allergies: Patient has no known allergies.  Medications: Prior to Admission medications   Medication Sig Start Date End Date Taking? Authorizing Provider  acetaminophen (TYLENOL) 500 MG tablet Take 1,000 mg by mouth every 6 (six) hours as needed for mild pain, moderate pain, fever or headache.    Yes [provider]  clonazePAM (KLONOPIN) 0.5 MG tablet Take 0.5 mg  by mouth daily.   Yes [provider]     Vital Signs: BP 115/73 (BP Location: Right Arm)   Pulse 88   Temp 98.4 F (36.9 C) (Oral)   Resp 16   Ht 5\' 7"  (1.702 m)   Wt 170 lb (77.1 kg)   SpO2 97%   BMI 26.63 kg/m   Physical Exam awake, alert, tearful. Chest clear to auscultation bilaterally. Heart with regular rate and rhythm. Abdomen soft, positive bowel sounds, mild pelvic tenderness to palpation; lower extremities with some trace edema.  Imaging: Ct Abdomen Pelvis W Contrast  Result Date: 03/21/2017 CLINICAL DATA:  Lower abdominal pain and rectal pain starting 2 days ago. History of rectal abscesses. EXAM: CT ABDOMEN AND PELVIS WITH CONTRAST TECHNIQUE: Multidetector CT imaging of the abdomen and pelvis was performed using the standard protocol following bolus administration of intravenous contrast. CONTRAST:  ISOVUE-300 IOPAMIDOL (ISOVUE-300) INJECTION 61% COMPARISON:  June 03, 2015 FINDINGS: Lower chest: No acute abnormality. Hepatobiliary: Low-attenuation adjacent to the falciform ligament is thought to be focal fatty deposition, mildly more prominent since 2016. No suspicious liver masses are identified. The gallbladder is normal in appearance. The portal vein is normal. Pancreas: Unremarkable. No pancreatic ductal dilatation or surrounding inflammatory changes. Spleen: Normal in size without focal abnormality. Adrenals/Urinary Tract: The adrenal glands, kidneys, and ureter are normal. The bladder is poorly distended limiting evaluation. Stomach/Bowel: The stomach and small bowel are normal. The lower rectum appears to be thick walled an inflamed. The remainder of the colon is normal. Visualized appendix demonstrates no evidence of appendicitis. There is a small amount of fluid adjacent  to the appendix but this is thought to be secondary to the process which will be described in the pelvis below. The appendix itself is nondilated with no evidence of inflammation.  Vascular/Lymphatic: No significant vascular findings are present. No enlarged abdominal or pelvic lymph nodes. Reproductive: The uterus is normal. Several follicles are seen in both ovaries. Other: No free air. There is a fluid collection containing a few foci of air intimately associated with the posterior aspect of the rectum as seen on axial image 77 measuring 4.5 x 4.4 x 4.1 cm. There is another fluid collection in the posterior left pelvis on series 2, image 69 measuring 4.8 by 3.7 cm. There is free fluid in the pelvis as well with no other drainable abscess identified. Musculoskeletal: No acute or significant osseous findings. IMPRESSION: 1. There are 2 abscesses in the pelvis. The largest is intimately associated with the posterior wall of the rectum and there is adjacent rectal inflammation. The other is in the left posterior pelvis as above. There is also some free fluid in the pelvis but no other drainable abscess. 2. A small amount of fluid around a normal caliber appendix is thought to be reactive from the process in the pelvis. There is no evidence of appendicitis. 3. No other acute abnormalities are seen. Electronically Signed   By: Gerome Samavid  Williams III M.D   On: 03/21/2017 13:48    Labs:  CBC:  Recent Labs  03/21/17 1003  WBC 10.4  HGB 12.0  HCT 35.8*  PLT 141*    COAGS: No results for input(s): INR, APTT in the last 8760 hours.  BMP:  Recent Labs  03/21/17 1003  NA 138  K 3.7  CL 106  CO2 26  GLUCOSE 83  BUN 7  CALCIUM 8.6*  CREATININE 0.59  GFRNONAA >60  GFRAA >60    LIVER FUNCTION TESTS: No results for input(s): BILITOT, AST, ALT, ALKPHOS, PROT, ALBUMIN in the last 8760 hours.  Assessment and Plan: Patient with history of abdominal/rectal pain and recurrent pelvic/perirectal abscesses-previously drained in 2016 ; request now received for CT guided aspiration/drainage of the abscesses. Imaging studies have been reviewed by Dr. Loreta AveWagner. Details/risks of  procedure, including but not limited to, internal bleeding, infection, injury to adjacent structures, need for prolonged drain maintenance discussed with patient with her understanding and consent. Procedure is tentatively scheduled for 6/29.   Electronically Signed: D. Jeananne RamaKevin Milledge Gerding, PA-C 03/21/2017, 4:46 PM   I spent a total of 25 minutes  at the the patient's bedside AND on the patient's hospital floor or unit, greater than 50% of which was counseling/coordinating care for CT-guided drainage of pelvic/perirectal abscesses    Patient ID: Stephanie [name], female   DOB: Feb 03, 1981, 36 y.o.   MRN: [id number]

## 2017-03-21 NOTE — ED Provider Notes (Signed)
[facility]-EMERGENCY DEPT Provider Note   CSN: [id number] Arrival date & time: 03/21/17  0831  By signing my name below, I, Linna Darner, attest that this documentation has been prepared under the direction and in the presence of physician practitioner, Raeford Razor, MD. Electronically Signed: Linna Darner, Scribe. 03/21/2017. 9:39 AM.  History   Chief Complaint Chief Complaint  Patient presents with  . Abdominal Pain  . Rectal Pain   The history is provided by the patient. No language interpreter was used.    HPI Comments: Stephanie [name] is a 36 y.o. female with PMHx including hemorrhoids and perirectal abscess who presents to the Emergency Department via EMS complaining of constant, gradually worsening rectal pain for about three days. She states it feels like there is a "rock" in her rectal area and her pain is consistent with her h/o hemorrhoids as well as a prior perirectal abscess. Patient endorses some associated mild abdominal pain and nausea without vomiting. She has tried epsom salt soaks and topical lidocaine without relief. Patient denies rectal bleeding, hematochezia, dysuria, hematuria, urinary retention, abnormal vaginal bleeding/discharge, fevers, chills, or any other associated symptoms.  Past Medical History:  Diagnosis Date  . Anemia   . Depression   . Diabetes mellitus    gestational  . H/O cesarean section 2010  . Hemorrhoids   . Panic attacks   . Schizophrenia (HCC)   . Vaginal delivery 2004 , 2008, 2009    Patient Active Problem List   Diagnosis Date Noted  . Perirectal abscess 05/28/2015  . Internal hemorrhoids - 3 column 05/01/2013    Past Surgical History:  Procedure Laterality Date  . CESAREAN SECTION    . INCISION AND DRAINAGE PERIRECTAL ABSCESS N/A 05/28/2015   Procedure: IRRIGATION AND DEBRIDEMENT PERIRECTAL ABSCESS;  Surgeon: Darnell Level, MD;  Location: [facility];  Service: General;  Laterality: N/A;  . TUBAL LIGATION  2010    OB History    Gravida Para Term Preterm AB Living   4 4 3 1   5    SAB TAB Ectopic Multiple Live Births                   Home Medications    Prior to Admission medications   Medication Sig Start Date End Date Taking? Authorizing Provider  acetaminophen (TYLENOL) 500 MG tablet Take 1,000 mg by mouth every 6 (six) hours as needed for moderate pain.    [provider]  amoxicillin-clavulanate (AUGMENTIN) 875-125 MG per tablet Take 1 tablet by mouth every 12 (twelve) hours. 06/04/15   Romie Levee, MD  Aspirin-Acetaminophen-Caffeine (GOODY HEADACHE PO) Take 1 Package by mouth daily as needed (fr pain).    [provider]  furosemide (LASIX) 20 MG tablet Take 1 tablet by mouth daily as needed for fluid.  02/26/15   [provider]  hydrocortisone (ANUSOL-HC) 25 MG suppository Place 1 suppository (25 mg total) rectally 2 (two) times daily. For 7 days Patient not taking: Reported on 05/28/2015 01/25/15   Felicie Morn, NP  lidocaine (XYLOCAINE JELLY) 2 % jelly Apply 1 application topically 2 (two) times daily as needed. Patient not taking: Reported on 01/25/2015 11/18/13   Ivonne Andrew, PA-C  lidocaine (XYLOCAINE) 2 % jelly Apply 1 application topically 2 (two) times daily as needed. Patient taking differently: Apply 1 application topically 2 (two) times daily as needed (pain).  01/25/15   Piepenbrink, Victorino Dike, PA-C  magnesium citrate ([employer] MAGNESIUM CITRATE) SOLN Take 1 Bottle by mouth once.  [provider]  oxyCODONE (OXY IR/ROXICODONE) 5 MG immediate release tablet Take 1-2 tablets (5-10 mg total) by mouth every 6 (six) hours as needed for moderate pain or severe pain. 06/04/15   Romie Leveehomas, Alicia, MD  phenylephrine-shark liver oil-mineral oil-petrolatum (PREPARATION H) 0.25-3-14-71.9 % rectal ointment Place 1 application rectally 2 (two) times daily as needed for hemorrhoids (hemorrhoids). pain    [provider]  polyethylene glycol powder (GLYCOLAX/MIRALAX) powder Take 17  g by mouth daily. Patient not taking: Reported on 01/25/2015 11/18/13   Ivonne Andrewammen, Peter, PA-C  polyethylene glycol powder (GLYCOLAX/MIRALAX) powder Take 17 g by mouth 2 (two) times daily. Until daily soft stools  OTC 01/25/15   Piepenbrink, Victorino DikeJennifer, PA-C    Family History Family History  Problem Relation Age of Onset  . Hypertension Mother   . Hypertension Other     Social History Social History  Substance Use Topics  . Smoking status: Current Every Day Smoker    Packs/day: 0.50    Types: Cigarettes  . Smokeless tobacco: Never Used  . Alcohol use Yes     Comment: ocassionally     Allergies   Patient has no known allergies.   Review of Systems Review of Systems  All other systems reviewed and are negative for acute change except as noted in the HPI. Physical Exam Updated Vital Signs BP 127/81 (BP Location: Right Arm)   Pulse 81   Temp 98.5 F (36.9 C) (Oral)   Resp 16   Ht 5\' 7"  (1.702 m)   Wt 170 lb (77.1 kg)   SpO2 100%   BMI 26.63 kg/m   Physical Exam  Constitutional: She appears well-developed and well-nourished.  HENT:  Head: Normocephalic.  Right Ear: External ear normal.  Left Ear: External ear normal.  Nose: Nose normal.  Mouth/Throat: Oropharynx is clear and moist.  Eyes: Conjunctivae are normal. Right eye exhibits no discharge. Left eye exhibits no discharge.  Neck: Normal range of motion.  Cardiovascular: Normal rate, regular rhythm and normal heart sounds.   No murmur heard. Pulmonary/Chest: Effort normal and breath sounds normal. No respiratory distress. She has no wheezes. She has no rales.  Abdominal: Soft. She exhibits no distension. There is no tenderness. There is no rebound and no guarding.  Genitourinary:  Genitourinary Comments: Rectal exam: Small skin tag. Exquisite pain with DRE. No fluctuance or specific mass palpated. No stool in rectal vault. No blood/mucus noted on glove.   Musculoskeletal: Normal range of motion. She exhibits no edema  or tenderness.  Neurological: She is alert. No cranial nerve deficit. Coordination normal.  Skin: Skin is warm and dry. No rash noted. No erythema. No pallor.  Psychiatric: She has a normal mood and affect. Her behavior is normal.  Nursing note and vitals reviewed.  ED Treatments / Results  Labs (all labs ordered are listed, but only abnormal results are displayed) Labs Reviewed  URINALYSIS, ROUTINE W REFLEX MICROSCOPIC - Abnormal; Notable for the following:       Result Value   APPearance HAZY (*)    Ketones, ur 5 (*)    Leukocytes, UA LARGE (*)    Bacteria, UA RARE (*)    Squamous Epithelial / LPF 0-5 (*)    All other components within normal limits  CBC WITH DIFFERENTIAL/PLATELET - Abnormal; Notable for the following:    RBC 3.61 (*)    HCT 35.8 (*)    Platelets 141 (*)    All other components within normal limits  BASIC METABOLIC  PANEL - Abnormal; Notable for the following:    Calcium 8.6 (*)    All other components within normal limits  URINE CULTURE  PREGNANCY, URINE    EKG  EKG Interpretation None       Radiology Ct Abdomen Pelvis W Contrast  Result Date: 03/21/2017 CLINICAL DATA:  Lower abdominal pain and rectal pain starting 2 days ago. History of rectal abscesses. EXAM: CT ABDOMEN AND PELVIS WITH CONTRAST TECHNIQUE: Multidetector CT imaging of the abdomen and pelvis was performed using the standard protocol following bolus administration of intravenous contrast. CONTRAST:  ISOVUE-300 IOPAMIDOL (ISOVUE-300) INJECTION 61% COMPARISON:  June 03, 2015 FINDINGS: Lower chest: No acute abnormality. Hepatobiliary: Low-attenuation adjacent to the falciform ligament is thought to be focal fatty deposition, mildly more prominent since 2016. No suspicious liver masses are identified. The gallbladder is normal in appearance. The portal vein is normal. Pancreas: Unremarkable. No pancreatic ductal dilatation or surrounding inflammatory changes. Spleen: Normal in size  without focal abnormality. Adrenals/Urinary Tract: The adrenal glands, kidneys, and ureter are normal. The bladder is poorly distended limiting evaluation. Stomach/Bowel: The stomach and small bowel are normal. The lower rectum appears to be thick walled an inflamed. The remainder of the colon is normal. Visualized appendix demonstrates no evidence of appendicitis. There is a small amount of fluid adjacent to the appendix but this is thought to be secondary to the process which will be described in the pelvis below. The appendix itself is nondilated with no evidence of inflammation. Vascular/Lymphatic: No significant vascular findings are present. No enlarged abdominal or pelvic lymph nodes. Reproductive: The uterus is normal. Several follicles are seen in both ovaries. Other: No free air. There is a fluid collection containing a few foci of air intimately associated with the posterior aspect of the rectum as seen on axial image 77 measuring 4.5 x 4.4 x 4.1 cm. There is another fluid collection in the posterior left pelvis on series 2, image 69 measuring 4.8 by 3.7 cm. There is free fluid in the pelvis as well with no other drainable abscess identified. Musculoskeletal: No acute or significant osseous findings. IMPRESSION: 1. There are 2 abscesses in the pelvis. The largest is intimately associated with the posterior wall of the rectum and there is adjacent rectal inflammation. The other is in the left posterior pelvis as above. There is also some free fluid in the pelvis but no other drainable abscess. 2. A small amount of fluid around a normal caliber appendix is thought to be reactive from the process in the pelvis. There is no evidence of appendicitis. 3. No other acute abnormalities are seen. Electronically Signed   By: Gerome Sam III M.D   On: 03/21/2017 13:48    Procedures Procedures (including critical care time)  DIAGNOSTIC STUDIES: Oxygen Saturation is 100% on RA, normal by my interpretation.      COORDINATION OF CARE: 9:38 AM Discussed treatment plan with pt at bedside and pt agreed to plan.  Medications Ordered in ED Medications - No data to display   Initial Impression / Assessment and Plan / ED Course  I have reviewed the triage vital signs and the nursing notes.  Pertinent labs & imaging results that were available during my care of the patient were reviewed by me and considered in my medical decision making (see chart for details).  Clinical Course as of Mar 21 1510  Thu Mar 21, 2017  1133 Pt updated. She reports her pain is better. Declining additional meds at this  time. Awaiting CT. Labs thus far unremarkable. Encouraged to provide urine sample.   [SK]  1400 Unfortunately, her suspicions were correct. Reoccurence of abscess. Rocephin previously ordered for possible UTI. Flagyl added. Surgery consulted. Pt updated. More pain meds ordered.   [SK]    Clinical Course User Index [SK] Raeford Razor, MD      Final Clinical Impressions(s) / ED Diagnoses   Final diagnoses:  Pelvic abscess in female    New Prescriptions New Prescriptions   No medications on file   I personally preformed the services scribed in my presence. The recorded information has been reviewed is accurate. Raeford Razor, MD.    Raeford Razor, MD 03/30/17 [phone]

## 2017-03-21 NOTE — Progress Notes (Signed)
She was seen and examined. She has no external perianal swelling. She has 2 pelvic abscesses. Interventional radiology has been consulted for percutaneous drainage. She'll be admitted to the hospital and started on intravenous antibiotics. She's had recurring problems with perirectal abscess. I recommended she be seen by gastroenterology as an outpatient and have colonoscopy performed to rule out inflammatory bowel disease after she gets over this illness.

## 2017-03-21 NOTE — ED Triage Notes (Signed)
Paitent picked up at home.  Found patient with complaints lower abdominal pain and rectal pain that started 2 days ago.  Patient has a Hx of rectal abscesses.  Patient has no fever on arrival to the Ed.  Currently she rates her pain 10 of 10.

## 2017-03-21 NOTE — ED Notes (Signed)
Off floor for testing 

## 2017-03-21 NOTE — H&P (Signed)
[facility] Admission Note  Stephanie [name] May 21, 1981  [id number].    Requesting MD: Virgel Manifold MD Chief Complaint/Reason for Consult: Pelvic abscess HPI: Patient presented to [facility] ED with 3-4 days of rectal fullness and pressure. Hx of hemorrhoids and perirectal abscess, tried sitz baths, laxatives and lidocaine jelly at home to relieve fullness and discomfort. Could not get relief and came to the ED. Also experiencing subjective fevers/chills, suprapubic abdominal pain, nausea without vomiting, constipation. Patient does not take any daily medications. Smokes 0.5 ppd, smokes marijuana, occasional alcohol use. Patient has 5 children. Surgical hx includes cesarean and tubal ligation.   ROS: Review of Systems  Constitutional: Positive for chills, diaphoresis, fever and malaise/fatigue. Negative for weight loss.  Respiratory: Negative for shortness of breath and wheezing.   Cardiovascular: Negative for chest pain and palpitations.  Gastrointestinal: Positive for abdominal pain (suprapubic), constipation and nausea. Negative for blood in stool, diarrhea, heartburn, melena and vomiting.  Genitourinary: Negative for dysuria, frequency and urgency.       No vaginal discharge or pain    Family History  Problem Relation Age of Onset  . Hypertension Mother   . Hypertension Other     Past Medical History:  Diagnosis Date  . Anemia   . Depression   . Diabetes mellitus    gestational  . H/O cesarean section 2010  . Hemorrhoids   . Panic attacks   . Schizophrenia ([place])   . Vaginal delivery 2004 , 2008, 2009    Past Surgical History:  Procedure Laterality Date  . CESAREAN SECTION    . INCISION AND DRAINAGE PERIRECTAL ABSCESS N/A 05/28/2015   Procedure: IRRIGATION AND DEBRIDEMENT PERIRECTAL ABSCESS;  Surgeon: Armandina Gemma, MD;  Location: [facility] ORS;  Service: General;  Laterality: N/A;  . TUBAL LIGATION  2010   Current Meds  Medication Sig  . acetaminophen (TYLENOL) 500 MG  tablet Take 1,000 mg by mouth every 6 (six) hours as needed for mild pain, moderate pain, fever or headache.   . clonazePAM (KLONOPIN) 0.5 MG tablet Take 0.5 mg by mouth daily.    Social History:  reports that she has been smoking Cigarettes.  She has been smoking about 0.50 packs per day. She has never used smokeless tobacco. She reports that she drinks alcohol. She reports that she uses drugs, including Marijuana, about 3 times per week.  Allergies: No Known Allergies   (Not in a hospital admission)  Blood pressure 115/73, pulse 88, temperature 98.4 F (36.9 C), temperature source Oral, resp. rate 16, height 5' 7" (1.702 m), weight 77.1 kg (170 lb), SpO2 97 %. Physical Exam: Physical Exam  Constitutional: She is oriented to person, place, and time. Vital signs are normal. She appears well-developed and well-nourished. She is cooperative.  Non-toxic appearance. No distress.  HENT:  Head: Normocephalic and atraumatic.  Right Ear: Hearing and external ear normal.  Left Ear: Hearing and external ear normal.  Nose: Nose normal.  Mouth/Throat: Oropharynx is clear and moist and mucous membranes are normal.  Eyes: Lids are normal. Pupils are equal, round, and reactive to light. Right conjunctiva is injected. Left conjunctiva is injected. No scleral icterus.  Neck: Trachea normal and normal range of motion. Neck supple.  Cardiovascular: Normal rate, regular rhythm, S1 normal and S2 normal.   Pulses:      Radial pulses are 2+ on the right side, and 2+ on the left side.       Dorsalis pedis pulses are 2+ on the right  side, and 2+ on the left side.  Pulmonary/Chest: Effort normal and breath sounds normal. She has no decreased breath sounds. She has no wheezes. She has no rhonchi. She has no rales.  Abdominal: Soft. Normal appearance and bowel sounds are normal. She exhibits no distension. There is no hepatosplenomegaly. There is no tenderness. There is no rigidity, no rebound and no guarding. No  hernia.  Genitourinary: Rectal exam shows tenderness. Rectal exam shows no external hemorrhoid, no mass and anal tone normal.  Genitourinary Comments: No obvious areas of fluctuance or induration   Musculoskeletal: Normal range of motion.  Patient without gross deformities or edema.   Neurological: She is alert and oriented to person, place, and time. She has normal strength. No sensory deficit.  Skin: Skin is warm and intact. She is diaphoretic.  Psychiatric: Her speech is normal and behavior is normal. Judgment normal. Her mood appears anxious.    Results for orders placed or performed during the hospital encounter of 03/21/17 (from the past 48 hour(s))  CBC with Differential     Status: Abnormal   Collection Time: 03/21/17 10:03 AM  Result Value Ref Range   WBC 10.4 4.0 - 10.5 K/uL   RBC 3.61 (L) 3.87 - 5.11 MIL/uL   Hemoglobin 12.0 12.0 - 15.0 g/dL   HCT 35.8 (L) 36.0 - 46.0 %   MCV 99.2 78.0 - 100.0 fL   MCH 33.2 26.0 - 34.0 pg   MCHC 33.5 30.0 - 36.0 g/dL   RDW 13.0 11.5 - 15.5 %   Platelets 141 (L) 150 - 400 K/uL   Neutrophils Relative % 72 %   Neutro Abs 7.4 1.7 - 7.7 K/uL   Lymphocytes Relative 20 %   Lymphs Abs 2.1 0.7 - 4.0 K/uL   Monocytes Relative 8 %   Monocytes Absolute 0.8 0.1 - 1.0 K/uL   Eosinophils Relative 0 %   Eosinophils Absolute 0.0 0.0 - 0.7 K/uL   Basophils Relative 0 %   Basophils Absolute 0.0 0.0 - 0.1 K/uL  Basic metabolic panel     Status: Abnormal   Collection Time: 03/21/17 10:03 AM  Result Value Ref Range   Sodium 138 135 - 145 mmol/L   Potassium 3.7 3.5 - 5.1 mmol/L   Chloride 106 101 - 111 mmol/L   CO2 26 22 - 32 mmol/L   Glucose, Bld 83 65 - 99 mg/dL   BUN 7 6 - 20 mg/dL   Creatinine, Ser 0.59 0.44 - 1.00 mg/dL   Calcium 8.6 (L) 8.9 - 10.3 mg/dL   GFR calc non Af Amer >60 >60 mL/min   GFR calc Af Amer >60 >60 mL/min    Comment: (NOTE) The eGFR has been calculated using the CKD EPI equation. This calculation has not been validated in  all clinical situations. eGFR's persistently <60 mL/min signify possible Chronic Kidney Disease.    Anion gap 6 5 - 15  Urinalysis, Routine w reflex microscopic     Status: Abnormal   Collection Time: 03/21/17 12:24 PM  Result Value Ref Range   Color, Urine YELLOW YELLOW   APPearance HAZY (A) CLEAR   Specific Gravity, Urine 1.021 1.005 - 1.030   pH 6.0 5.0 - 8.0   Glucose, UA NEGATIVE NEGATIVE mg/dL   Hgb urine dipstick NEGATIVE NEGATIVE   Bilirubin Urine NEGATIVE NEGATIVE   Ketones, ur 5 (A) NEGATIVE mg/dL   Protein, ur NEGATIVE NEGATIVE mg/dL   Nitrite NEGATIVE NEGATIVE   Leukocytes, UA LARGE (A) NEGATIVE  RBC / HPF 6-30 0 - 5 RBC/hpf   WBC, UA TOO NUMEROUS TO COUNT 0 - 5 WBC/hpf   Bacteria, UA RARE (A) NONE SEEN   Squamous Epithelial / LPF 0-5 (A) NONE SEEN   Mucous PRESENT   Pregnancy, urine     Status: None   Collection Time: 03/21/17 12:24 PM  Result Value Ref Range   Preg Test, Ur NEGATIVE NEGATIVE    Comment:        THE SENSITIVITY OF THIS METHODOLOGY IS >20 mIU/mL.    Ct Abdomen Pelvis W Contrast  Result Date: 03/21/2017 CLINICAL DATA:  Lower abdominal pain and rectal pain starting 2 days ago. History of rectal abscesses. EXAM: CT ABDOMEN AND PELVIS WITH CONTRAST TECHNIQUE: Multidetector CT imaging of the abdomen and pelvis was performed using the standard protocol following bolus administration of intravenous contrast. CONTRAST:  177m ISOVUE-300 IOPAMIDOL (ISOVUE-300) INJECTION 61% COMPARISON:  June 03, 2015 FINDINGS: Lower chest: No acute abnormality. Hepatobiliary: Low-attenuation adjacent to the falciform ligament is thought to be focal fatty deposition, mildly more prominent since 2016. No suspicious liver masses are identified. The gallbladder is normal in appearance. The portal vein is normal. Pancreas: Unremarkable. No pancreatic ductal dilatation or surrounding inflammatory changes. Spleen: Normal in size without focal abnormality. Adrenals/Urinary Tract:  The adrenal glands, kidneys, and ureter are normal. The bladder is poorly distended limiting evaluation. Stomach/Bowel: The stomach and small bowel are normal. The lower rectum appears to be thick walled an inflamed. The remainder of the colon is normal. Visualized appendix demonstrates no evidence of appendicitis. There is a small amount of fluid adjacent to the appendix but this is thought to be secondary to the process which will be described in the pelvis below. The appendix itself is nondilated with no evidence of inflammation. Vascular/Lymphatic: No significant vascular findings are present. No enlarged abdominal or pelvic lymph nodes. Reproductive: The uterus is normal. Several follicles are seen in both ovaries. Other: No free air. There is a fluid collection containing a few foci of air intimately associated with the posterior aspect of the rectum as seen on axial image 77 measuring 4.5 x 4.4 x 4.1 cm. There is another fluid collection in the posterior left pelvis on series 2, image 69 measuring 4.8 by 3.7 cm. There is free fluid in the pelvis as well with no other drainable abscess identified. Musculoskeletal: No acute or significant osseous findings. IMPRESSION: 1. There are 2 abscesses in the pelvis. The largest is intimately associated with the posterior wall of the rectum and there is adjacent rectal inflammation. The other is in the left posterior pelvis as above. There is also some free fluid in the pelvis but no other drainable abscess. 2. A small amount of fluid around a normal caliber appendix is thought to be reactive from the process in the pelvis. There is no evidence of appendicitis. 3. No other acute abnormalities are seen. Electronically Signed   By: DDorise BullionIII M.D   On: 03/21/2017 13:48      Assessment/Plan Pelvic Abscesses - CT - 2 abscesses in the pelvis. The largest is intimately associated with the posterior wall of the rectum and there is adjacent rectal inflammation.  The other is in the left posterior pelvis - admit patient  - IV abx, WBC normal, afebrile - recheck labs in AM - IR consulted for percutaneous drainage - NPO after midnight, hold lovenox - Will need colonoscopy at some point to evaluate for possible Crohn's vs other etiology of  recurrent abscess.  UTI - UA - leukocyte + with WBC TNTC - Cx pending   FEN - NPO after midnight.  VTE - SCDs, hold lovenox  ID - IV Zosyn (6/28>>>)  Plan: IR consult for drainage of pelvic abscesses. IV abx. Pain control.   Brigid Re, [facility] 03/21/2017, 3:03 PM Pager: [phone] Consults: [phone] Mon-Fri 7:00 am-4:30 pm Sat-Sun 7:00 am-11:30 am

## 2017-03-22 ENCOUNTER — Inpatient Hospital Stay (HOSPITAL_COMMUNITY): Payer: Medicaid Other

## 2017-03-22 ENCOUNTER — Encounter (HOSPITAL_COMMUNITY): Payer: Self-pay | Admitting: Radiology

## 2017-03-22 LAB — CBC
HEMATOCRIT: 34.1 % — AB (ref 36.0–46.0)
HEMOGLOBIN: 11.4 g/dL — AB (ref 12.0–15.0)
MCH: 32.9 pg (ref 26.0–34.0)
MCHC: 33.4 g/dL (ref 30.0–36.0)
MCV: 98.6 fL (ref 78.0–100.0)
Platelets: 124 10*3/uL — ABNORMAL LOW (ref 150–400)
RBC: 3.46 MIL/uL — ABNORMAL LOW (ref 3.87–5.11)
RDW: 13 % (ref 11.5–15.5)
WBC: 12 10*3/uL — AB (ref 4.0–10.5)

## 2017-03-22 LAB — URINE CULTURE: Culture: NO GROWTH

## 2017-03-22 LAB — BASIC METABOLIC PANEL
ANION GAP: 8 (ref 5–15)
BUN: 7 mg/dL (ref 6–20)
CHLORIDE: 106 mmol/L (ref 101–111)
CO2: 23 mmol/L (ref 22–32)
Calcium: 8 mg/dL — ABNORMAL LOW (ref 8.9–10.3)
Creatinine, Ser: 0.47 mg/dL (ref 0.44–1.00)
GFR calc Af Amer: >60 mL/min (ref >60)
GLUCOSE: 107 mg/dL — AB (ref 65–99)
Potassium: 3.6 mmol/L (ref 3.5–5.1)
Sodium: 137 mmol/L (ref 135–145)

## 2017-03-22 LAB — PROTIME-INR
INR: 1.05
Prothrombin Time: 13.7 seconds (ref 11.4–15.2)

## 2017-03-22 LAB — HIV ANTIBODY (ROUTINE TESTING W REFLEX): HIV Screen 4th Generation wRfx: NONREACTIVE

## 2017-03-22 MED ORDER — HYDROMORPHONE HCL 1 MG/ML IJ SOLN
1.0000 mg | INTRAMUSCULAR | Status: DC | PRN
  Administered 2017-03-22 – 2017-03-24 (×9): 1 mg via INTRAVENOUS
  Filled 2017-03-22 (×9): qty 1

## 2017-03-22 MED ORDER — LIP MEDEX EX OINT
TOPICAL_OINTMENT | CUTANEOUS | Status: AC
  Administered 2017-03-22: 13:00:00
  Filled 2017-03-22: qty 7

## 2017-03-22 MED ORDER — FENTANYL CITRATE (PF) 100 MCG/2ML IJ SOLN
INTRAMUSCULAR | Status: AC
  Filled 2017-03-22: qty 4

## 2017-03-22 MED ORDER — MIDAZOLAM HCL 2 MG/2ML IJ SOLN
INTRAMUSCULAR | Status: AC | PRN
  Administered 2017-03-22 (×2): 1 mg via INTRAVENOUS

## 2017-03-22 MED ORDER — MIDAZOLAM HCL 2 MG/2ML IJ SOLN
INTRAMUSCULAR | Status: AC
  Filled 2017-03-22: qty 4

## 2017-03-22 MED ORDER — PANTOPRAZOLE SODIUM 40 MG PO TBEC
40.0000 mg | DELAYED_RELEASE_TABLET | Freq: Every day | ORAL | Status: DC
  Administered 2017-03-22 – 2017-03-24 (×3): 40 mg via ORAL
  Filled 2017-03-22 (×3): qty 1

## 2017-03-22 MED ORDER — FENTANYL CITRATE (PF) 100 MCG/2ML IJ SOLN
INTRAMUSCULAR | Status: AC | PRN
  Administered 2017-03-22 (×2): 50 ug via INTRAVENOUS

## 2017-03-22 MED ORDER — OXYCODONE HCL 5 MG PO TABS
5.0000 mg | ORAL_TABLET | ORAL | Status: DC | PRN
  Administered 2017-03-22 – 2017-03-25 (×6): 10 mg via ORAL
  Filled 2017-03-22 (×6): qty 2

## 2017-03-22 MED ORDER — LIDOCAINE HCL 1 % IJ SOLN
INTRAMUSCULAR | Status: AC | PRN
  Administered 2017-03-22: 10 mL

## 2017-03-22 NOTE — Progress Notes (Signed)
Assessment Active Problems:   Pelvic abscess in female-s/p percutaneous drainage:  She feels better; purulent fluid in drain   Plan:  Continue IV abxs.  Follow up on cultures and adjust abxs accordingly.  Repeat CT likely Monday.  If source is felt to to GI in nature, would refer for colonoscopy.  If source is felt to be GYN in nature, OP GYN follow up.   LOS: 1 day        Chief Complaint/Subjective: Feels better  Objective: Vital signs in last 24 hours: Temp:  [97.8 F (36.6 C)-99 F (37.2 C)] 97.8 F (36.6 C) (06/29 1100) Pulse Rate:  [74-94] 74 (06/29 1100) Resp:  [14-19] 17 (06/29 1100) BP: (109-138)/(69-110) 128/80 (06/29 1100) SpO2:  [95 %-100 %] 100 % (06/29 1100) Last BM Date: 03/20/17  Intake/Output from previous day: 06/28 0701 - 06/29 0700 In: 2941.7 [I.V.:1691.7; IV Piggyback:1250] Out: 1050 [Urine:1050] Intake/Output this shift: Total I/O In: -  Out: 200 [Urine:200]  PE: General- In NAD.  Awake and alert. Gluteal drain in place with brown fluid output  Lab Results:   Recent Labs  03/21/17 1003 03/22/17 0404  WBC 10.4 12.0*  HGB 12.0 11.4*  HCT 35.8* 34.1*  PLT 141* 124*   BMET  Recent Labs  03/21/17 1003 03/22/17 0404  NA 138 137  K 3.7 3.6  CL 106 106  CO2 26 23  GLUCOSE 83 107*  BUN 7 7  CREATININE 0.59 0.47  CALCIUM 8.6* 8.0*   PT/INR  Recent Labs  03/22/17 0404  LABPROT 13.7  INR 1.05   Comprehensive Metabolic Panel:    Component Value Date/Time   NA 137 03/22/2017 0404   NA 138 03/21/2017 1003   K 3.6 03/22/2017 0404   K 3.7 03/21/2017 1003   CL 106 03/22/2017 0404   CL 106 03/21/2017 1003   CO2 23 03/22/2017 0404   CO2 26 03/21/2017 1003   BUN 7 03/22/2017 0404   BUN 7 03/21/2017 1003   CREATININE 0.47 03/22/2017 0404   CREATININE 0.59 03/21/2017 1003   GLUCOSE 107 (H) 03/22/2017 0404   GLUCOSE 83 03/21/2017 1003   CALCIUM 8.0 (L) 03/22/2017 0404   CALCIUM 8.6 (L) 03/21/2017 1003   AST 33 06/02/2015 0435    AST 17 05/28/2015 1747   ALT 25 06/02/2015 0435   ALT 12 (L) 05/28/2015 1747   ALKPHOS 61 06/02/2015 0435   ALKPHOS 88 05/28/2015 1747   BILITOT 0.6 06/02/2015 0435   BILITOT 0.6 05/28/2015 1747   PROT 6.4 (L) 06/02/2015 0435   PROT 8.1 05/28/2015 1747   ALBUMIN 2.6 (L) 06/02/2015 0435   ALBUMIN 3.7 05/28/2015 1747     Studies/Results: Ct Abdomen Pelvis W Contrast  Result Date: 03/21/2017 CLINICAL DATA:  Lower abdominal pain and rectal pain starting 2 days ago. History of rectal abscesses. EXAM: CT ABDOMEN AND PELVIS WITH CONTRAST TECHNIQUE: Multidetector CT imaging of the abdomen and pelvis was performed using the standard protocol following bolus administration of intravenous contrast. CONTRAST:  100mL ISOVUE-300 IOPAMIDOL (ISOVUE-300) INJECTION 61% COMPARISON:  June 03, 2015 FINDINGS: Lower chest: No acute abnormality. Hepatobiliary: Low-attenuation adjacent to the falciform ligament is thought to be focal fatty deposition, mildly more prominent since 2016. No suspicious liver masses are identified. The gallbladder is normal in appearance. The portal vein is normal. Pancreas: Unremarkable. No pancreatic ductal dilatation or surrounding inflammatory changes. Spleen: Normal in size without focal abnormality. Adrenals/Urinary Tract: The adrenal glands, kidneys, and ureter are normal. The bladder is  poorly distended limiting evaluation. Stomach/Bowel: The stomach and small bowel are normal. The lower rectum appears to be thick walled an inflamed. The remainder of the colon is normal. Visualized appendix demonstrates no evidence of appendicitis. There is a small amount of fluid adjacent to the appendix but this is thought to be secondary to the process which will be described in the pelvis below. The appendix itself is nondilated with no evidence of inflammation. Vascular/Lymphatic: No significant vascular findings are present. No enlarged abdominal or pelvic lymph nodes. Reproductive: The uterus  is normal. Several follicles are seen in both ovaries. Other: No free air. There is a fluid collection containing a few foci of air intimately associated with the posterior aspect of the rectum as seen on axial image 77 measuring 4.5 x 4.4 x 4.1 cm. There is another fluid collection in the posterior left pelvis on series 2, image 69 measuring 4.8 by 3.7 cm. There is free fluid in the pelvis as well with no other drainable abscess identified. Musculoskeletal: No acute or significant osseous findings. IMPRESSION: 1. There are 2 abscesses in the pelvis. The largest is intimately associated with the posterior wall of the rectum and there is adjacent rectal inflammation. The other is in the left posterior pelvis as above. There is also some free fluid in the pelvis but no other drainable abscess. 2. A small amount of fluid around a normal caliber appendix is thought to be reactive from the process in the pelvis. There is no evidence of appendicitis. 3. No other acute abnormalities are seen. Electronically Signed   By: Gerome Sam III M.D   On: 03/21/2017 13:48    Anti-infectives: Anti-infectives    Start     Dose/Rate Route Frequency Ordered Stop   03/21/17 1630  piperacillin-tazobactam (ZOSYN) IVPB 3.375 g     3.375 g 12.5 mL/hr over 240 Minutes Intravenous Every 8 hours 03/21/17 1609 03/27/17 1759   03/21/17 1400  metroNIDAZOLE (FLAGYL) IVPB 500 mg     500 mg 100 mL/hr over 60 Minutes Intravenous  Once 03/21/17 1353 03/21/17 1608   03/21/17 1245  cefTRIAXone (ROCEPHIN) 1 g in dextrose 5 % 50 mL IVPB     1 g 100 mL/hr over 30 Minutes Intravenous  Once 03/21/17 1244 03/21/17 1350       Karston Hyland J 03/22/2017

## 2017-03-22 NOTE — Procedures (Signed)
Interventional Radiology Procedure Note  Procedure: CT guided drainage of abscess in the left pelvis.  ~30cc of purulent material aspirated  39F drain to bulb.   Findings: The fluid adjacent to the rectum on prior CT is no longer present, potentially freely mobile/reactive ascites.  There was no target for attempted drainage in the prone position.  1 drain only.  .  Complications: None Recommendations:  - Routine drain care - record output daily - TID sterile saline flushes - Do not submerge     Signed,  Yvone NeuJaime S. Loreta AveWagner, DO

## 2017-03-22 NOTE — Progress Notes (Signed)
[facility]/Trauma Progress Note      Subjective:  CC: rectal pain  Pt states pain is unchanged and feeling of pressure in her rectum is unchanged. She had nausea with one episode of non bilious vomiting last night. She is not having abdominal pain. No BM.   Objective: Vital signs in last 24 hours: Temp:  [98.1 F (36.7 C)-99 F (37.2 C)] 98.1 F (36.7 C) (06/29 0600) Pulse Rate:  [74-94] 74 (06/29 0600) Resp:  [16-22] 16 (06/29 0600) BP: (104-138)/(65-110) 138/83 (06/29 0600) SpO2:  [89 %-100 %] 100 % (06/29 0600) Weight:  [170 lb (77.1 kg)] 170 lb (77.1 kg) (06/28 0834) Last BM Date: 03/20/17  Intake/Output from previous day: 06/28 0701 - 06/29 0700 In: 2941.7 [I.V.:1691.7; IV Piggyback:1250] Out: 1050 [Urine:1050] Intake/Output this shift: No intake/output data recorded.  PE: Gen:  Alert, NAD, pleasant, cooperative Card:  RRR, no M/G/R heard Pulm:  Rate and effort normal Abd: Soft, NT/ND, +BS Skin: no rashes noted, warm and dry  Lab Results:   Recent Labs  03/21/17 1003 03/22/17 0404  WBC 10.4 12.0*  HGB 12.0 11.4*  HCT 35.8* 34.1*  PLT 141* 124*   BMET  Recent Labs  03/21/17 1003 03/22/17 0404  NA 138 137  K 3.7 3.6  CL 106 106  CO2 26 23  GLUCOSE 83 107*  BUN 7 7  CREATININE 0.59 0.47  CALCIUM 8.6* 8.0*   PT/INR  Recent Labs  03/22/17 0404  LABPROT 13.7  INR 1.05   CMP     Component Value Date/Time   NA 137 03/22/2017 0404   K 3.6 03/22/2017 0404   CL 106 03/22/2017 0404   CO2 23 03/22/2017 0404   GLUCOSE 107 (H) 03/22/2017 0404   BUN 7 03/22/2017 0404   CREATININE 0.47 03/22/2017 0404   CALCIUM 8.0 (L) 03/22/2017 0404   PROT 6.4 (L) 06/02/2015 0435   ALBUMIN 2.6 (L) 06/02/2015 0435   AST 33 06/02/2015 0435   ALT 25 06/02/2015 0435   ALKPHOS 61 06/02/2015 0435   BILITOT 0.6 06/02/2015 0435   GFRNONAA >60 03/22/2017 0404   GFRAA >60 03/22/2017 0404   Lipase     Component Value Date/Time   LIPASE 10 (L)  05/28/2015 1747    Studies/Results: Ct Abdomen Pelvis W Contrast  Result Date: 03/21/2017 CLINICAL DATA:  Lower abdominal pain and rectal pain starting 2 days ago. History of rectal abscesses. EXAM: CT ABDOMEN AND PELVIS WITH CONTRAST TECHNIQUE: Multidetector CT imaging of the abdomen and pelvis was performed using the standard protocol following bolus administration of intravenous contrast. CONTRAST:  ISOVUE-300 IOPAMIDOL (ISOVUE-300) INJECTION 61% COMPARISON:  June 03, 2015 FINDINGS: Lower chest: No acute abnormality. Hepatobiliary: Low-attenuation adjacent to the falciform ligament is thought to be focal fatty deposition, mildly more prominent since 2016. No suspicious liver masses are identified. The gallbladder is normal in appearance. The portal vein is normal. Pancreas: Unremarkable. No pancreatic ductal dilatation or surrounding inflammatory changes. Spleen: Normal in size without focal abnormality. Adrenals/Urinary Tract: The adrenal glands, kidneys, and ureter are normal. The bladder is poorly distended limiting evaluation. Stomach/Bowel: The stomach and small bowel are normal. The lower rectum appears to be thick walled an inflamed. The remainder of the colon is normal. Visualized appendix demonstrates no evidence of appendicitis. There is a small amount of fluid adjacent to the appendix but this is thought to be secondary to the process which will be described in the pelvis below. The appendix itself is nondilated  with no evidence of inflammation. Vascular/Lymphatic: No significant vascular findings are present. No enlarged abdominal or pelvic lymph nodes. Reproductive: The uterus is normal. Several follicles are seen in both ovaries. Other: No free air. There is a fluid collection containing a few foci of air intimately associated with the posterior aspect of the rectum as seen on axial image 77 measuring 4.5 x 4.4 x 4.1 cm. There is another fluid collection in the posterior left pelvis  on series 2, image 69 measuring 4.8 by 3.7 cm. There is free fluid in the pelvis as well with no other drainable abscess identified. Musculoskeletal: No acute or significant osseous findings. IMPRESSION: 1. There are 2 abscesses in the pelvis. The largest is intimately associated with the posterior wall of the rectum and there is adjacent rectal inflammation. The other is in the left posterior pelvis as above. There is also some free fluid in the pelvis but no other drainable abscess. 2. A small amount of fluid around a normal caliber appendix is thought to be reactive from the process in the pelvis. There is no evidence of appendicitis. 3. No other acute abnormalities are seen. Electronically Signed   By: Gerome Samavid  Williams III M.D   On: 03/21/2017 13:48    Anti-infectives: Anti-infectives    Start     Dose/Rate Route Frequency Ordered Stop   03/21/17 1630  piperacillin-tazobactam (ZOSYN) IVPB 3.375 g     3.375 g 12.5 mL/hr over 240 Minutes Intravenous Every 8 hours 03/21/17 1609 03/27/17 1759   03/21/17 1400  metroNIDAZOLE (FLAGYL) IVPB 500 mg     500 mg 100 mL/hr over 60 Minutes Intravenous  Once 03/21/17 1353 03/21/17 1608   03/21/17 1245  cefTRIAXone (ROCEPHIN) 1 g in dextrose 5 % 50 mL IVPB     1 g 100 mL/hr over 30 Minutes Intravenous  Once 03/21/17 1244 03/21/17 1350       Assessment/Plan Pelvic Abscesses - CT - 2 abscesses in the pelvis. The largest is intimately associated with the posterior wall of the rectum and there is adjacent rectal inflammation. The other is in the left posterior pelvis - WBC elevated to 12.0, afebrile  - recheck labs in AM - Will need colonoscopy at some point to evaluate for possible Crohn's vs other etiology of recurrent abscess.   UTI - UA - leukocyte + with WBC TNTC - Cx pending   FEN - NPO   VTE - SCDs, hold lovenox  ID - IV Zosyn (6/28>>>)  Plan: IR drains for pelvic abscesses. IV abx. Pain control.  GI outpt follow up   LOS: 1 day     Jerre SimonJessica L Joziyah Roblero , [facility] 03/22/2017, 8:02 AM Pager: [phone] Consults: [phone] Mon-Fri 7:00 am-4:30 pm Sat-Sun 7:00 am-11:30 am

## 2017-03-22 NOTE — Progress Notes (Signed)
Nurse walked in to patient's room at 1950. Pt requested pain medication. Pain medication could not be given until 2034. Pt became very tearful and agitated. She stated that I did not want to do anything for her pain. Nurse explained to patient that I can only give what is ordered and it was too soon give you pain medications. On-call MD Dr. Gerrit FriendsGerkin was paged. He called back at 2025. IV dilaudid was changed to 1-2 mg every 2 hours as needed. Pt also stated that she was still nauseated. Too soon to given Zofran. Received order for IV phenergan.

## 2017-03-23 LAB — COMPREHENSIVE METABOLIC PANEL
ALT: 7 U/L — ABNORMAL LOW (ref 14–54)
ANION GAP: 5 (ref 5–15)
AST: 10 U/L — ABNORMAL LOW (ref 15–41)
Albumin: 3 g/dL — ABNORMAL LOW (ref 3.5–5.0)
Alkaline Phosphatase: 40 U/L (ref 38–126)
BILIRUBIN TOTAL: 0.6 mg/dL (ref 0.3–1.2)
CO2: 27 mmol/L (ref 22–32)
Calcium: 8.2 mg/dL — ABNORMAL LOW (ref 8.9–10.3)
Chloride: 106 mmol/L (ref 101–111)
Creatinine, Ser: 0.57 mg/dL (ref 0.44–1.00)
GFR calc Af Amer: >60 mL/min (ref >60)
Glucose, Bld: 109 mg/dL — ABNORMAL HIGH (ref 65–99)
POTASSIUM: 3.6 mmol/L (ref 3.5–5.1)
Sodium: 138 mmol/L (ref 135–145)
TOTAL PROTEIN: 6 g/dL — AB (ref 6.5–8.1)

## 2017-03-23 LAB — CBC
HEMATOCRIT: 31.4 % — AB (ref 36.0–46.0)
Hemoglobin: 10.3 g/dL — ABNORMAL LOW (ref 12.0–15.0)
MCH: 33.1 pg (ref 26.0–34.0)
MCHC: 32.8 g/dL (ref 30.0–36.0)
MCV: 101 fL — AB (ref 78.0–100.0)
Platelets: 128 10*3/uL — ABNORMAL LOW (ref 150–400)
RBC: 3.11 MIL/uL — ABNORMAL LOW (ref 3.87–5.11)
RDW: 13 % (ref 11.5–15.5)
WBC: 8.9 10*3/uL (ref 4.0–10.5)

## 2017-03-23 NOTE — Progress Notes (Signed)
Pt resting comfortably in room. Talkative and friendly.  No complaints. No signs and symptoms of distress.

## 2017-03-23 NOTE — Progress Notes (Signed)
Patient ID: Stephanie [name], female   DOB: 08-10-1981, 36 y.o.   MRN: [id number]    Referring Physician(s): Dr. Avel Peace  Supervising Physician: Richarda Overlie  Patient Status: [facility] - In-pt  Chief Complaint: Pelvic abscess  Subjective: Patient feels well.  Eating a bacon cheeseburger  Allergies: Patient has no known allergies.  Medications: Prior to Admission medications   Medication Sig Start Date End Date Taking? Authorizing Provider  acetaminophen (TYLENOL) 500 MG tablet Take 1,000 mg by mouth every 6 (six) hours as needed for mild pain, moderate pain, fever or headache.    Yes [provider]  clonazePAM (KLONOPIN) 0.5 MG tablet Take 0.5 mg by mouth daily.   Yes [provider]    Vital Signs: BP 128/78 (BP Location: Left Arm)   Pulse 62   Temp 98.2 F (36.8 C) (Oral)   Resp 14   Ht 5\' 7"  (1.702 m)   Wt 170 lb (77.1 kg)   LMP 03/16/2017 Comment: pregnancy waiver form signed 03-22-2017/negative urine pregnancy test 03-21-2017  SpO2 100%   BMI 26.63 kg/m   Physical Exam: Abd: soft, drain in buttocks.  Minimal output.  Drain site is c/d/i  Imaging: Ct Abdomen Pelvis W Contrast  Result Date: 03/21/2017 CLINICAL DATA:  Lower abdominal pain and rectal pain starting 2 days ago. History of rectal abscesses. EXAM: CT ABDOMEN AND PELVIS WITH CONTRAST TECHNIQUE: Multidetector CT imaging of the abdomen and pelvis was performed using the standard protocol following bolus administration of intravenous contrast. CONTRAST:  ISOVUE-300 IOPAMIDOL (ISOVUE-300) INJECTION 61% COMPARISON:  June 03, 2015 FINDINGS: Lower chest: No acute abnormality. Hepatobiliary: Low-attenuation adjacent to the falciform ligament is thought to be focal fatty deposition, mildly more prominent since 2016. No suspicious liver masses are identified. The gallbladder is normal in appearance. The portal vein is normal. Pancreas: Unremarkable. No pancreatic ductal dilatation or  surrounding inflammatory changes. Spleen: Normal in size without focal abnormality. Adrenals/Urinary Tract: The adrenal glands, kidneys, and ureter are normal. The bladder is poorly distended limiting evaluation. Stomach/Bowel: The stomach and small bowel are normal. The lower rectum appears to be thick walled an inflamed. The remainder of the colon is normal. Visualized appendix demonstrates no evidence of appendicitis. There is a small amount of fluid adjacent to the appendix but this is thought to be secondary to the process which will be described in the pelvis below. The appendix itself is nondilated with no evidence of inflammation. Vascular/Lymphatic: No significant vascular findings are present. No enlarged abdominal or pelvic lymph nodes. Reproductive: The uterus is normal. Several follicles are seen in both ovaries. Other: No free air. There is a fluid collection containing a few foci of air intimately associated with the posterior aspect of the rectum as seen on axial image 77 measuring 4.5 x 4.4 x 4.1 cm. There is another fluid collection in the posterior left pelvis on series 2, image 69 measuring 4.8 by 3.7 cm. There is free fluid in the pelvis as well with no other drainable abscess identified. Musculoskeletal: No acute or significant osseous findings. IMPRESSION: 1. There are 2 abscesses in the pelvis. The largest is intimately associated with the posterior wall of the rectum and there is adjacent rectal inflammation. The other is in the left posterior pelvis as above. There is also some free fluid in the pelvis but no other drainable abscess. 2. A small amount of fluid around a normal caliber appendix is thought to be reactive from the process in the pelvis. There  is no evidence of appendicitis. 3. No other acute abnormalities are seen. 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 The antibiotics were administered within an appropriate time frame prior to the initiation of the procedure. ANESTHESIA/SEDATION: 2.0 mg IV Versed 100 mcg IV Fentanyl Moderate Sedation Time:  16 minutes The patient was continuously monitored during the procedure by the interventional radiology nurse under my direct supervision. COMPLICATIONS: None TECHNIQUE: Informed written consent was obtained from the patient after a thorough discussion of the procedural risks, benefits and alternatives. All questions were addressed. Maximal Sterile Barrier Technique was utilized including caps, mask, sterile gowns, sterile gloves, sterile drape, hand hygiene and skin antiseptic. A timeout was performed prior to the initiation of the procedure. PROCEDURE: The operative field was prepped with chlorhexidine in a sterile fashion, and a sterile drape was applied covering the operative field. A sterile gown and sterile gloves were used for the procedure. Local anesthesia was provided with 1% Lidocaine. Patient position prone position on the CT gantry table and a scout CT was performed for planning purposes. Once the patient is prepped and draped in the usual sterile fashion, the skin and subcutaneous tissues were generously infiltrated 1% lidocaine for local anesthesia. Using CT guidance, 18 gauge trocar needle was advanced into the abscess in left pelvis confirming the location with aspiration of small amount of purulent material. Using modified Seldinger technique, a 10 French drain was placed in the cavity. Approximately 30 cc of frankly purulent material aspirated. Sample sent the lab for analysis. Drain was attached to ball drainage. Sutured in place. Its bandage was placed. Patient tolerated  the procedure well and remained hemodynamically stable throughout. No complications were encountered and no significant blood loss. FINDINGS: Scout CT in the prone position demonstrates abscess in the left pelvis, which has been previously drained, 05/31/2015. Abscess measures approximately 4.9 cm. In this prone position, there was no persistent significant perirectal fluid which was present on the prior CT. No safe window was identified to the small volume of fluid in this site at this time. Given the reduced volume of this fluid, this may represent a combination of reactive ascitic fluid and possible infection. Given no safe window, aspiration/drainage not attempted at this time. Hyperdense material in the presacral space, potentially granuloma formation or thrombosed veins. IMPRESSION: Status post CT-guided drainage of left pelvic abscess. Approximately 30 cc of frankly purulent material aspirated. Signed, Yvone Neu. Loreta Ave, DO [facility] Radiology Electronically Signed   By: Gilmer Mor D.O.   On: 03/22/2017 11:26    Labs:  CBC:  Recent Labs  03/21/17 1003 03/22/17 0404 03/23/17 0505  WBC 10.4 12.0* 8.9  HGB 12.0 11.4* 10.3*  HCT 35.8* 34.1* 31.4*  PLT 141* 124* 128*    COAGS:  Recent Labs  03/22/17 0404  INR 1.05    BMP:  Recent Labs  03/21/17 1003 03/22/17 0404 03/23/17 0505  NA 138 137 138  K 3.7 3.6 3.6  CL 106 106 106  CO2 26 23 27   GLUCOSE 83 107* 109*  BUN 7 7 <5*  CALCIUM 8.6* 8.0* 8.2*  CREATININE 0.59 0.47 0.57  GFRNONAA >60 >60 >60  GFRAA >60 >60 >60    LIVER FUNCTION TESTS:  Recent Labs  03/23/17 0505  BILITOT 0.6  AST 10*  ALT 7*  ALKPHOS 40  PROT 6.0*  ALBUMIN 3.0*    Assessment and Plan: Pelvic abscess, s/p perc drain  WBC is improved today.  Patient has no pain and is tolerating a solid diet. Prelim CX shows gram positive rods Anticipate repeat CT scan when drain output is below  10-15cc a day and drain has been in for around at least 5 days.  We will follow.  If patient goes home prior to drain being removed, we can have her follow up in drain clinic as an outpatient  Electronically Signed: Esteban Kobashigawa E 03/23/2017, 1:00 PM   I spent a total of 15 Minutes at the the patient's bedside AND on the patient's hospital floor or unit, greater than 50% of which was counseling/coordinating care for pelvic abscess

## 2017-03-23 NOTE — Progress Notes (Signed)
Patient ID: Stephanie [name], female   DOB: Aug 27, 1981, 36 y.o.   MRN: [id number] [facility] Surgery Progress Note:   * No surgery found *  Subjective: Mental status is clear. Objective: Vital signs in last 24 hours: Temp:  [98.1 F (36.7 C)-98.8 F (37.1 C)] 98.2 F (36.8 C) (06/30 0537) Pulse Rate:  [62-75] 62 (06/30 0537) Resp:  [14-16] 14 (06/30 0537) BP: (127-130)/(77-84) 128/78 (06/30 0537) SpO2:  [100 %] 100 % (06/30 0537)  Intake/Output from previous day: 06/29 0701 - 06/30 0700 In: 4152.1 [P.O.:1320; I.V.:2677.1; IV Piggyback:150] Out: 2250 [Urine:2250] Intake/Output this shift: Total I/O In: 720 [P.O.:720] Out: 900 [Urine:900]  Physical Exam: Work of breathing is normal.  Drain in pelvic collection and patient feeling better.    Lab Results:  Results for orders placed or performed during the hospital encounter of 03/21/17 (from the past 48 hour(s))  Urinalysis, Routine w reflex microscopic     Status: Abnormal   Collection Time: 03/21/17 12:24 PM  Result Value Ref Range   Color, Urine YELLOW YELLOW   APPearance HAZY (A) CLEAR   Specific Gravity, Urine 1.021 1.005 - 1.030   pH 6.0 5.0 - 8.0   Glucose, UA NEGATIVE NEGATIVE mg/dL   Hgb urine dipstick NEGATIVE NEGATIVE   Bilirubin Urine NEGATIVE NEGATIVE   Ketones, ur 5 (A) NEGATIVE mg/dL   Protein, ur NEGATIVE NEGATIVE mg/dL   Nitrite NEGATIVE NEGATIVE   Leukocytes, UA LARGE (A) NEGATIVE   RBC / HPF 6-30 0 - 5 RBC/hpf   WBC, UA TOO NUMEROUS TO COUNT 0 - 5 WBC/hpf   Bacteria, UA RARE (A) NONE SEEN   Squamous Epithelial / LPF 0-5 (A) NONE SEEN   Mucous PRESENT   Pregnancy, urine     Status: None   Collection Time: 03/21/17 12:24 PM  Result Value Ref Range   Preg Test, Ur NEGATIVE NEGATIVE    Comment:        THE SENSITIVITY OF THIS METHODOLOGY IS >20 mIU/mL.   Urine culture     Status: None   Collection Time: 03/21/17 12:24 PM  Result Value Ref Range   Specimen Description URINE, CLEAN CATCH     Special Requests NONE    Culture      NO GROWTH Performed at [facility], [address] [place],  [address]    Report Status 03/22/2017 FINAL   HIV antibody (Routine Testing)     Status: None   Collection Time: 03/21/17  6:15 PM  Result Value Ref Range   HIV Screen 4th Generation wRfx Non Reactive Non Reactive    Comment: (NOTE) Performed At: [facility] [place], [place] [id number] Lindon Romp MD BU:[id number]   Basic metabolic panel     Status: Abnormal   Collection Time: 03/22/17  4:04 AM  Result Value Ref Range   Sodium 137 135 - 145 mmol/L   Potassium 3.6 3.5 - 5.1 mmol/L   Chloride 106 101 - 111 mmol/L   CO2 23 22 - 32 mmol/L   Glucose, Bld 107 (H) 65 - 99 mg/dL   BUN 7 6 - 20 mg/dL   Creatinine, Ser 0.47 0.44 - 1.00 mg/dL   Calcium 8.0 (L) 8.9 - 10.3 mg/dL   GFR calc non Af Amer >60 >60 mL/min   GFR calc Af Amer >60 >60 mL/min    Comment: (NOTE) The eGFR has been calculated using the CKD EPI equation. This calculation has not been validated in  all clinical situations. eGFR's persistently <60 mL/min signify possible Chronic Kidney Disease.    Anion gap 8 5 - 15  CBC     Status: Abnormal   Collection Time: 03/22/17  4:04 AM  Result Value Ref Range   WBC 12.0 (H) 4.0 - 10.5 K/uL   RBC 3.46 (L) 3.87 - 5.11 MIL/uL   Hemoglobin 11.4 (L) 12.0 - 15.0 g/dL   HCT 34.1 (L) 36.0 - 46.0 %   MCV 98.6 78.0 - 100.0 fL   MCH 32.9 26.0 - 34.0 pg   MCHC 33.4 30.0 - 36.0 g/dL   RDW 13.0 11.5 - 15.5 %   Platelets 124 (L) 150 - 400 K/uL  Protime-INR     Status: None   Collection Time: 03/22/17  4:04 AM  Result Value Ref Range   Prothrombin Time 13.7 11.4 - 15.2 seconds   INR 1.05   Aerobic/Anaerobic Culture (surgical/deep wound)     Status: None (Preliminary result)   Collection Time: 03/22/17  9:38 AM  Result Value Ref Range   Specimen Description ABDOMEN    Special Requests NONE    Gram Stain      MODERATE WBC PRESENT,  PREDOMINANTLY PMN FEW SQUAMOUS EPITHELIAL CELLS PRESENT ABUNDANT GRAM POSITIVE RODS Performed at [facility], [place] [address] [place], [place] [address]    Culture PENDING    Report Status PENDING   CBC     Status: Abnormal   Collection Time: 03/23/17  5:05 AM  Result Value Ref Range   WBC 8.9 4.0 - 10.5 K/uL   RBC 3.11 (L) 3.87 - 5.11 MIL/uL   Hemoglobin 10.3 (L) 12.0 - 15.0 g/dL   HCT 31.4 (L) 36.0 - 46.0 %   MCV 101.0 (H) 78.0 - 100.0 fL   MCH 33.1 26.0 - 34.0 pg   MCHC 32.8 30.0 - 36.0 g/dL   RDW 13.0 11.5 - 15.5 %   Platelets 128 (L) 150 - 400 K/uL  Comprehensive metabolic panel     Status: Abnormal   Collection Time: 03/23/17  5:05 AM  Result Value Ref Range   Sodium 138 135 - 145 mmol/L   Potassium 3.6 3.5 - 5.1 mmol/L   Chloride 106 101 - 111 mmol/L   CO2 27 22 - 32 mmol/L   Glucose, Bld 109 (H) 65 - 99 mg/dL   BUN <5 (L) 6 - 20 mg/dL   Creatinine, Ser 0.57 0.44 - 1.00 mg/dL   Calcium 8.2 (L) 8.9 - 10.3 mg/dL   Total Protein 6.0 (L) 6.5 - 8.1 g/dL   Albumin 3.0 (L) 3.5 - 5.0 g/dL   AST 10 (L) 15 - 41 U/L   ALT 7 (L) 14 - 54 U/L   Alkaline Phosphatase 40 38 - 126 U/L   Total Bilirubin 0.6 0.3 - 1.2 mg/dL   GFR calc non Af Amer >60 >60 mL/min   GFR calc Af Amer >60 >60 mL/min    Comment: (NOTE) The eGFR has been calculated using the CKD EPI equation. This calculation has not been validated in all clinical situations. eGFR's persistently <60 mL/min signify possible Chronic Kidney Disease.    Anion gap 5 5 - 15    Radiology/Results: Ct Abdomen Pelvis W Contrast  Result Date: 03/21/2017 CLINICAL DATA:  Lower abdominal pain and rectal pain starting 2 days ago. History of rectal abscesses. EXAM: CT ABDOMEN AND PELVIS WITH CONTRAST TECHNIQUE: Multidetector CT imaging of the abdomen and pelvis was performed using the standard protocol  following bolus administration of intravenous contrast. CONTRAST:  169m ISOVUE-300 IOPAMIDOL (ISOVUE-300) INJECTION 61%  COMPARISON:  June 03, 2015 FINDINGS: Lower chest: No acute abnormality. Hepatobiliary: Low-attenuation adjacent to the falciform ligament is thought to be focal fatty deposition, mildly more prominent since 2016. No suspicious liver masses are identified. The gallbladder is normal in appearance. The portal vein is normal. Pancreas: Unremarkable. No pancreatic ductal dilatation or surrounding inflammatory changes. Spleen: Normal in size without focal abnormality. Adrenals/Urinary Tract: The adrenal glands, kidneys, and ureter are normal. The bladder is poorly distended limiting evaluation. Stomach/Bowel: The stomach and small bowel are normal. The lower rectum appears to be thick walled an inflamed. The remainder of the colon is normal. Visualized appendix demonstrates no evidence of appendicitis. There is a small amount of fluid adjacent to the appendix but this is thought to be secondary to the process which will be described in the pelvis below. The appendix itself is nondilated with no evidence of inflammation. Vascular/Lymphatic: No significant vascular findings are present. No enlarged abdominal or pelvic lymph nodes. Reproductive: The uterus is normal. Several follicles are seen in both ovaries. Other: No free air. There is a fluid collection containing a few foci of air intimately associated with the posterior aspect of the rectum as seen on axial image 77 measuring 4.5 x 4.4 x 4.1 cm. There is another fluid collection in the posterior left pelvis on series 2, image 69 measuring 4.8 by 3.7 cm. There is free fluid in the pelvis as well with no other drainable abscess identified. Musculoskeletal: No acute or significant osseous findings. IMPRESSION: 1. There are 2 abscesses in the pelvis. The largest is intimately associated with the posterior wall of the rectum and there is adjacent rectal inflammation. The other is in the left posterior pelvis as above. There is also some free fluid in the pelvis but no  other drainable abscess. 2. A small amount of fluid around a normal caliber appendix is thought to be reactive from the process in the pelvis. There is no evidence of appendicitis. 3. No other acute abnormalities are seen. 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 The antibiotics were administered within an appropriate time frame prior to the initiation of the procedure. ANESTHESIA/SEDATION: 2.0 mg IV Versed 100 mcg IV Fentanyl Moderate Sedation Time:  16 minutes The patient was continuously monitored during the procedure by the interventional radiology nurse under my direct supervision. COMPLICATIONS: None TECHNIQUE: Informed written consent was obtained from the patient after a thorough discussion of the procedural risks, benefits and alternatives. All questions were addressed. Maximal Sterile Barrier Technique was utilized including caps, mask, sterile gowns, sterile gloves, sterile drape, hand hygiene and skin antiseptic. A timeout was performed prior to the initiation of the procedure. PROCEDURE: The operative field was prepped with chlorhexidine in a sterile fashion, and a sterile drape was applied covering the operative field. A sterile gown and sterile gloves were used for the procedure. Local anesthesia was provided with 1% Lidocaine. Patient position prone position on the CT gantry table and a scout CT was performed for planning purposes. Once the patient is prepped and draped in the usual sterile fashion, the skin and subcutaneous tissues  were generously infiltrated 1% lidocaine for local anesthesia. Using CT guidance, 18 gauge trocar needle was advanced into the  abscess in left pelvis confirming the location with aspiration of small amount of purulent material. Using modified Seldinger technique, a 10 French drain was placed in the cavity. Approximately 30 cc of frankly purulent material aspirated. Sample sent the lab for analysis. Drain was attached to ball drainage. Sutured in place. Its bandage was placed. Patient tolerated the procedure well and remained hemodynamically stable throughout. No complications were encountered and no significant blood loss. FINDINGS: Scout CT in the prone position demonstrates abscess in the left pelvis, which has been previously drained, 05/31/2015. Abscess measures approximately 4.9 cm. In this prone position, there was no persistent significant perirectal fluid which was present on the prior CT. No safe window was identified to the small volume of fluid in this site at this time. Given the reduced volume of this fluid, this may represent a combination of reactive ascitic fluid and possible infection. Given no safe window, aspiration/drainage not attempted at this time. Hyperdense material in the presacral space, potentially granuloma formation or thrombosed veins. IMPRESSION: Status post CT-guided drainage of left pelvic abscess. Approximately 30 cc of frankly purulent material aspirated. Signed, Dulcy Fanny. Earleen Newport, DO [facility] Radiology Electronically Signed   By: Corrie Mckusick D.O.   On: 03/22/2017 11:26    Anti-infectives: Anti-infectives    Start     Dose/Rate Route Frequency Ordered Stop   03/21/17 1630  piperacillin-tazobactam (ZOSYN) IVPB 3.375 g     3.375 g 12.5 mL/hr over 240 Minutes Intravenous Every 8 hours 03/21/17 1609 03/27/17 1759   03/21/17 1400  metroNIDAZOLE (FLAGYL) IVPB 500 mg     500 mg 100 mL/hr over 60 Minutes Intravenous  Once 03/21/17 1353 03/21/17 1608   03/21/17 1245  cefTRIAXone (ROCEPHIN) 1 g in dextrose 5 % 50 mL IVPB     1 g 100 mL/hr over 30  Minutes Intravenous  Once 03/21/17 1244 03/21/17 1350      Assessment/Plan: Problem List: Patient Active Problem List   Diagnosis Date Noted  . Pelvic abscess in female 03/21/2017  . Perirectal abscess 05/28/2015  . Internal hemorrhoids - 3 column 05/01/2013    Improvement after placement of pelvic drain * No surgery found *    LOS: 2 days   Matt B. Hassell Done, MD, [facility] [phone] beeper [phone]  03/23/2017 12:13 PM

## 2017-03-24 NOTE — Progress Notes (Signed)
Patient ID: Stephanie [name], female   DOB: [birth year], 36 y.o.   MRN: [id number] [facility] Surgery Progress Note:   * No surgery found *  Subjective: Mental status is clear.  Up and about Objective: Vital signs in last 24 hours: Temp:  [97.4 F (36.3 C)-98.2 F (36.8 C)] 98.2 F (36.8 C) (07/01 0722) Pulse Rate:  [64-72] 64 (07/01 0722) Resp:  [15-16] 15 (07/01 0722) BP: (125-153)/(75-99) 153/99 (07/01 0722) SpO2:  [100 %] 100 % (07/01 0722)  Intake/Output from previous day: 06/30 0701 - 07/01 0700 In: 3211.4 [P.O.:1920; I.V.:1149.7; IV Piggyback:141.7] Out: 3505 [Urine:3500; Drains:5] Intake/Output this shift: No intake/output data recorded.  Physical Exam: Work of breathing is normal.  Drain in place.  Feeling better  Lab Results:  Results for orders placed or performed during the hospital encounter of 03/21/17 (from the past 48 hour(s))  Aerobic/Anaerobic Culture (surgical/deep wound)     Status: None (Preliminary result)   Collection Time: 03/22/17  9:38 AM  Result Value Ref Range   Specimen Description ABDOMEN    Special Requests NONE    Gram Stain      MODERATE WBC PRESENT, PREDOMINANTLY PMN FEW SQUAMOUS EPITHELIAL CELLS PRESENT ABUNDANT GRAM POSITIVE RODS    Culture      NO GROWTH 1 DAY Performed at [facility], [place] [address] [place], [place] [address]    Report Status PENDING   CBC     Status: Abnormal   Collection Time: 03/23/17  5:05 AM  Result Value Ref Range   WBC 8.9 4.0 - 10.5 K/uL   RBC 3.11 (L) 3.87 - 5.11 MIL/uL   Hemoglobin 10.3 (L) 12.0 - 15.0 g/dL   HCT 31.4 (L) 36.0 - 46.0 %   MCV 101.0 (H) 78.0 - 100.0 fL   MCH 33.1 26.0 - 34.0 pg   MCHC 32.8 30.0 - 36.0 g/dL   RDW 13.0 11.5 - 15.5 %   Platelets 128 (L) 150 - 400 K/uL  Comprehensive metabolic panel     Status: Abnormal   Collection Time: 03/23/17  5:05 AM  Result Value Ref Range   Sodium 138 135 - 145 mmol/L   Potassium 3.6 3.5 - 5.1 mmol/L   Chloride 106 101 - 111 mmol/L    CO2 27 22 - 32 mmol/L   Glucose, Bld 109 (H) 65 - 99 mg/dL   BUN <5 (L) 6 - 20 mg/dL   Creatinine, Ser 0.57 0.44 - 1.00 mg/dL   Calcium 8.2 (L) 8.9 - 10.3 mg/dL   Total Protein 6.0 (L) 6.5 - 8.1 g/dL   Albumin 3.0 (L) 3.5 - 5.0 g/dL   AST 10 (L) 15 - 41 U/L   ALT 7 (L) 14 - 54 U/L   Alkaline Phosphatase 40 38 - 126 U/L   Total Bilirubin 0.6 0.3 - 1.2 mg/dL   GFR calc non Af Amer >60 >60 mL/min   GFR calc Af Amer >60 >60 mL/min    Comment: (NOTE) The eGFR has been calculated using the CKD EPI equation. This calculation has not been validated in all clinical situations. eGFR's persistently <60 mL/min signify possible Chronic Kidney Disease.    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 The  antibiotics were administered within an appropriate time frame prior to the initiation of the procedure. ANESTHESIA/SEDATION: 2.0 mg IV Versed 100 mcg IV Fentanyl Moderate Sedation Time:  16 minutes The patient was continuously monitored during the procedure by the interventional radiology nurse under my direct supervision. COMPLICATIONS: None TECHNIQUE: Informed written consent was obtained from the patient after a thorough discussion of the procedural risks, benefits and alternatives. All questions were addressed. Maximal Sterile Barrier Technique was utilized including caps, mask, sterile gowns, sterile gloves, sterile drape, hand hygiene and skin antiseptic. A timeout was performed prior to the initiation of the procedure. PROCEDURE: The operative field was prepped with chlorhexidine in a sterile fashion, and a sterile drape was applied covering the operative field. A sterile gown and sterile gloves  were used for the procedure. Local anesthesia was provided with 1% Lidocaine. Patient position prone position on the CT gantry table and a scout CT was performed for planning purposes. Once the patient is prepped and draped in the usual sterile fashion, the skin and subcutaneous tissues were generously infiltrated 1% lidocaine for local anesthesia. Using CT guidance, 18 gauge trocar needle was advanced into the abscess in left pelvis confirming the location with aspiration of small amount of purulent material. Using modified Seldinger technique, a 10 French drain was placed in the cavity. Approximately 30 cc of frankly purulent material aspirated. Sample sent the lab for analysis. Drain was attached to ball drainage. Sutured in place. Its bandage was placed. Patient tolerated the procedure well and remained hemodynamically stable throughout. No complications were encountered and no significant blood loss. FINDINGS: Scout CT in the prone position demonstrates abscess in the left pelvis, which has been previously drained, 05/31/2015. Abscess measures approximately 4.9 cm. In this prone position, there was no persistent significant perirectal fluid which was present on the prior CT. No safe window was identified to the small volume of fluid in this site at this time. Given the reduced volume of this fluid, this may represent a combination of reactive ascitic fluid and possible infection. Given no safe window, aspiration/drainage not attempted at this time. Hyperdense material in the presacral space, potentially granuloma formation or thrombosed veins. IMPRESSION: Status post CT-guided drainage of left pelvic abscess. Approximately 30 cc of frankly purulent material aspirated. Signed, Dulcy Fanny. Earleen Newport, DO [facility] Electronically Signed   By: Corrie Mckusick D.O.   On: 03/22/2017 11:26    Anti-infectives: Anti-infectives    Start     Dose/Rate Route  Frequency Ordered Stop   03/21/17 1630  piperacillin-tazobactam (ZOSYN) IVPB 3.375 g     3.375 g 12.5 mL/hr over 240 Minutes Intravenous Every 8 hours 03/21/17 1609 03/27/17 1759   03/21/17 1400  metroNIDAZOLE (FLAGYL) IVPB 500 mg     500 mg 100 mL/hr over 60 Minutes Intravenous  Once 03/21/17 1353 03/21/17 1608   03/21/17 1245  cefTRIAXone (ROCEPHIN) 1 g in dextrose 5 % 50 mL IVPB     1 g 100 mL/hr over 30 Minutes Intravenous  Once 03/21/17 1244 03/21/17 1350      Assessment/Plan: Problem List: Patient Active Problem List   Diagnosis Date Noted  . Pelvic abscess in female 03/21/2017  . Perirectal abscess 05/28/2015  . Internal hemorrhoids - 3 column 05/01/2013    Improved.  Possible discharge tomorrow with followup in the drain clinic for reassessment  * No surgery found *    LOS: 3 days   Matt B. Hassell Done, MD, [facility]  [facility] [phone] beeper [phone]  03/24/2017 8:53 AM

## 2017-03-25 ENCOUNTER — Other Ambulatory Visit: Payer: Self-pay | Admitting: Radiology

## 2017-03-25 DIAGNOSIS — N739 Female pelvic inflammatory disease, unspecified: Secondary | ICD-10-CM

## 2017-03-25 MED ORDER — AMOXICILLIN-POT CLAVULANATE 875-125 MG PO TABS: 1.0000 | ORAL_TABLET | Freq: Two times a day (BID) | ORAL | 0 refills | Status: AC

## 2017-03-25 MED ORDER — OXYCODONE HCL 5 MG PO TABS: 5.0000 mg | ORAL_TABLET | Freq: Four times a day (QID) | ORAL | 0 refills | Status: DC | PRN

## 2017-03-25 MED ORDER — SODIUM CHLORIDE 0.9% FLUSH: 5.0000 mL | Freq: Every day | INTRAVENOUS | 0 refills | Status: DC

## 2017-03-25 NOTE — Progress Notes (Signed)
Spoke with patient at bedside. Discussed HH services for drain care, limitations of visits (nurse only once per day, need for teachable caregiver). Patient became extremely upset and tearful, stating HH limitations were d/t having Medicaid. Tried to explain nursing services are limited for all payers, usually get daily visits but not more than once per day. Tried to explain that nurses would educate her on care prior to d/c. This made her more upset, she told me to set up what I could. She then states she needed to speak to a supervisor, I offered to get someone for her but she states that she knew how to get them. She started making a call so I left. After several agencies declined, [facility]. Nurse is aware and will notify the patient.

## 2017-03-25 NOTE — Progress Notes (Signed)
Discharge instruction given to patient. Patient education for drain flushing, emptying and dressing change. Sharrell Ku Latitia Housewright RN

## 2017-03-25 NOTE — Discharge Instructions (Signed)
Keep drain site clean and dry. Flush daily with 5 mL normal saline.   The drain clinic will call you to schedule your follow up appointment for repeat CT scan and to determine timing of drain removal.

## 2017-03-25 NOTE — Discharge Summary (Signed)
[facility] Discharge Summary   Patient ID: Stephanie [name] MRN: [id number] DOB/AGE: [birth year] 36 y.o.  Admit date: 03/21/2017 Discharge date: 03/25/2017  Discharge Diagnosis Patient Active Problem List   Diagnosis Date Noted  . Pelvic abscess in female 03/21/2017  . Perirectal abscess 05/28/2015  . Internal hemorrhoids - 3 column 05/01/2013   Consultants Interventional Radiology - Dr. Richarda OverlieAdam Henn  Imaging: No results found.  Procedures CT guided percutaneous drainage of left pelvic abscess 03/22/17 - Dr. Alyce PaganJaime Wagner  Hospital Course:  [facility] with 3-4 days of rectal fullness and pressure. Hx of hemorrhoids, perirectal abscess, and perc drainage of posterior pelvic abscess 05/31/15. Patient tried sitz baths, laxatives and lidocaine jelly at home to relieve fullness and discomfort, however could not get relief and came to the ED. Workup significant for 2 pelvic abscesses, largest associated with posterior wall of the rectum and other in left posterior pelvis. Patient underwent the procedure above by IR, which she tolerated well. Symptoms gradually improved and on 03/25/17 the patient was clinically stable for discharge. She will require HH for drain care and outpatient follow up in the IR clinic for drain removal.   Physical Exam: Gen:  Alert, NAD, pleasant, cooperative Card:  RRR, no M/G/R heard Pulm:  Rate and effort normal Abd: Soft, NT/ND, +BS Buttock: drain site, c/d/i, no current drainage in bulb drain. Skin: no rashes noted, warm and dry  Allergies as of 03/25/2017   No Known Allergies     Medication List    TAKE these medications   acetaminophen 500 MG tablet Commonly known as:  TYLENOL Take 1,000 mg by mouth every 6 (six) hours as needed for mild pain, moderate pain, fever or headache.   amoxicillin-clavulanate 875-125 MG tablet Commonly known as:  AUGMENTIN Take 1 tablet by mouth 2 (two) times daily.   clonazePAM 0.5 MG  tablet Commonly known as:  KLONOPIN Take 0.5 mg by mouth daily.   oxyCODONE 5 MG immediate release tablet Commonly known as:  Oxy IR/ROXICODONE Take 1-2 tablets (5-10 mg total) by mouth every 6 (six) hours as needed (5mg  for moderate pain, 10mg  for severe pain).      Follow-up Information    [facility] [place] Call.   Specialty:  General Surgery Why:  as needed Contact information: [address] [place] [id number] [phone]         Signed: Hosie SpangleElizabeth Jasiyah Paulding, [facility] 03/25/2017, 10:12 AM Pager: [phone] Consults: [phone] Mon-Fri 7:00 am-4:30 pm Sat-Sun 7:00 am-11:30 am

## 2017-03-26 ENCOUNTER — Other Ambulatory Visit: Payer: Self-pay | Admitting: Surgery

## 2017-03-26 DIAGNOSIS — N739 Female pelvic inflammatory disease, unspecified: Secondary | ICD-10-CM

## 2017-03-27 LAB — AEROBIC/ANAEROBIC CULTURE (SURGICAL/DEEP WOUND)

## 2017-03-27 LAB — AEROBIC/ANAEROBIC CULTURE W GRAM STAIN (SURGICAL/DEEP WOUND): Culture: NO GROWTH

## 2017-03-31 ENCOUNTER — Other Ambulatory Visit: Payer: Self-pay | Admitting: Surgery

## 2017-03-31 MED ORDER — LIDOCAINE 5 % EX OINT: TOPICAL_OINTMENT | Freq: Four times a day (QID) | CUTANEOUS | Status: DC | PRN

## 2017-04-02 ENCOUNTER — Other Ambulatory Visit: Payer: Medicaid Other

## 2017-04-02 ENCOUNTER — Ambulatory Visit
Admission: RE | Admit: 2017-04-02 | Discharge: 2017-04-02 | Disposition: A | Discharge status: Home / Self Care | Payer: Medicaid Other | Source: Ambulatory Visit | Attending: Radiology | Admitting: Radiology

## 2017-04-02 ENCOUNTER — Other Ambulatory Visit: Payer: Self-pay | Admitting: Surgery

## 2017-04-02 ENCOUNTER — Ambulatory Visit
Admission: RE | Admit: 2017-04-02 | Discharge: 2017-04-02 | Disposition: A | Discharge status: Home / Self Care | Payer: Medicaid Other | Source: Ambulatory Visit | Attending: Surgery | Admitting: Surgery

## 2017-04-02 DIAGNOSIS — N739 Female pelvic inflammatory disease, unspecified: Secondary | ICD-10-CM

## 2017-04-02 HISTORY — PX: IR RADIOLOGIST EVAL & MGMT: IMG5224

## 2017-04-02 MED ORDER — IOPAMIDOL (ISOVUE-300) INJECTION 61%
100.0000 mL | Freq: Once | INTRAVENOUS | Status: AC | PRN
  Administered 2017-04-02: 100 mL via INTRAVENOUS

## 2017-04-02 NOTE — Progress Notes (Signed)
Patient ID: Stephanie [name], female   DOB: 1981/07/12, 36 y.o.   MRN: [id number]         Chief Complaint: Recurrent pararectal and pelvic abscess, post drainage catheter placement  Referring Physician(s): Daphine Deutscher  History of Present Illness: Stephanie [name] is a 36 y.o. female with past medical history significant for depression, diabetes, anxiety and schizophrenia as well as history of prior perirectal abscess requiring drainage catheter placement on 05/31/2015 by Dr. Fredia Sorrow. Unfortunately, patient developed recurrent perirectal pain with CT scan of the abdomen pelvis performed 03/21/2017 demonstrating a recurrent perirectal and left pelvic abscesses for which the patient underwent placement of a CT-guided drainage catheter on 03/22/2017 by Dr. Loreta Ave.   Patient presents to the interventional radiology drain clinic for drainage catheter evaluation and management.  She continues to flush the percutaneous drainage catheter. She reports minimal to no output from the percutaneous drainage catheter.   The patient is currently taking her prescribed course of Augmentin.  Past Medical History:  Diagnosis Date  . Anemia   . Depression   . Diabetes mellitus    gestational  . H/O cesarean section 2010  . Hemorrhoids   . Panic attacks   . Schizophrenia (HCC)   . Vaginal delivery 2004 , 2008, 2009    Past Surgical History:  Procedure Laterality Date  . CESAREAN SECTION    . INCISION AND DRAINAGE PERIRECTAL ABSCESS N/A 05/28/2015   Procedure: IRRIGATION AND DEBRIDEMENT PERIRECTAL ABSCESS;  Surgeon: Darnell Level, MD;  Location: [facility];  Service: General;  Laterality: N/A;  . TUBAL LIGATION  2010    Allergies: Patient has no known allergies.  Medications: Prior to Admission medications   Medication Sig Start Date End Date Taking? Authorizing Provider  acetaminophen (TYLENOL) 500 MG tablet Take 1,000 mg by mouth every 6 (six) hours as needed for mild pain, moderate pain, fever or headache.      [provider]  amoxicillin-clavulanate (AUGMENTIN) 875-125 MG tablet Take 1 tablet by mouth 2 (two) times daily. 03/25/17 04/04/17  Adam Phenix, PA-C  clonazePAM (KLONOPIN) 0.5 MG tablet Take 0.5 mg by mouth daily.    [provider]  oxyCODONE (OXY IR/ROXICODONE) 5 MG immediate release tablet Take 1-2 tablets (5-10 mg total) by mouth every 6 (six) hours as needed (5mg  for moderate pain, 10mg  for severe pain). 03/25/17   Adam Phenix, PA-C  sodium chloride flush (NS) 0.9 % SOLN 5 mLs by Other route daily. Flush drain with 5 cc saline daily. 03/25/17   Adam Phenix, PA-C     Family History  Problem Relation Age of Onset  . Hypertension Mother   . Hypertension Other     Social History   Social History  . Marital status: Single    Spouse name: N/A  . Number of children: N/A  . Years of education: N/A   Social History Main Topics  . Smoking status: Current Every Day Smoker    Packs/day: 0.50    Types: Cigarettes  . Smokeless tobacco: Never Used  . Alcohol use Yes     Comment: ocassionally  . Drug use: Yes    Frequency: 3.0 times per week    Types: Marijuana  . Sexual activity: Yes   Other Topics Concern  . Not on file   Social History Narrative  . No narrative on file    ECOG Status: 1 - Symptomatic but completely ambulatory  Review of Systems: A 12 point ROS discussed and pertinent positives are  indicated in the HPI above.  All other systems are negative.  Review of Systems  Constitutional: Negative for activity change, appetite change, fatigue and fever.  Gastrointestinal: Negative for abdominal pain, constipation and diarrhea.    Vital Signs: BP 126/77   Pulse 68   Temp 98.3 F (36.8 C)   LMP 03/16/2017 Comment: pregnancy waiver form signed 03-22-2017/negative urine pregnancy test 03-21-2017  SpO2 100%   Physical Exam  Mallampati Score:     Imaging: Ct Abdomen Pelvis W Contrast  Result Date: 04/02/2017 CLINICAL DATA:   History of recurrent abscess within the left pelvis, post percutaneous drainage catheter placement on 03/22/2017 by Dr. Loreta Ave. Note, patient previously underwent left-sided percutaneous drainage catheter placement on 05/31/2015 by Dr. Fredia Sorrow. Patient presents to the Interventional Radiology drain Clinic for percutaneous drainage catheter evaluation and management. The patient continues to flush the drainage catheter daily. She reports minimal to no output from the drainage catheter. The patient continues on her prescribed course of Augmentin. EXAM: CT ABDOMEN AND PELVIS WITH CONTRAST TECHNIQUE: Multidetector CT imaging of the abdomen and pelvis was performed using the standard protocol following bolus administration of intravenous contrast. CONTRAST:  ISOVUE-300 IOPAMIDOL (ISOVUE-300) INJECTION 61% COMPARISON:  CT scan abdomen and pelvis - 03/21/2017; CT-guided left trans gluteal approach percutaneous drainage catheter placement - 03/22/2017. FINDINGS: Lower chest: Limited visualization of the lower thorax demonstrates minimal dependent subpleural ground-glass atelectasis. No focal airspace opacities. No pleural effusion. Normal heart size.  No pericardial effusion. Hepatobiliary: Re- demonstrated hepatomegaly. Normal hepatic contour. Focal fatty infiltration adjacent to the fissure for ligamentum teres. No discrete hepatic lesions. No radiopaque gallstones. No intra extrahepatic bili duct dilatation. No ascites. Pancreas: Normal appearance of the pancreas Spleen: Normal appearance of the spleen Adrenals/Urinary Tract: There is symmetric enhancement of the bilateral kidneys. No renal stones on this postcontrast examination. No discrete renal lesions. No urinary obstruction or perinephric stranding. Normal appearance of the bilateral adrenal glands. Normal appearance of the urinary bladder given underdistention. Stomach/Bowel: Unchanged positioning of left trans gluteal approach percutaneous drainage  catheter with end coiled and locked within the left hemipelvis with complete resolution of both left hemi pelvic and perirectal fluid collections Unremarkable colonic stool burden. No evidence of enteric obstruction. Unremarkable colonic stool burden. Normal appearance of the terminal ileum and retrocecal appendix. No pneumoperitoneum, pneumatosis or portal venous gas. Vascular/Lymphatic: Normal caliber of the abdominal aorta. The major branch vessels of the abdominal aorta appear patent on this non CTA examination. Note is made of a circumaortic left renal vein. Reproductive: Normal appearance of the pelvic organs. No discrete adnexal lesion. Suspected trace amount of presumably physiologic fluid within the pelvic cul-de-sac. Other: Regional soft tissues appear normal. Musculoskeletal: No acute or aggressive osseous abnormalities. IMPRESSION: Complete resolution of both left hemi pelvic and perirectal fluid collections post percutaneous drainage catheter placement. No new definable/drainable fluid collections. PLAN: Patient subsequently underwent percutaneous drainage catheter injection. Electronically Signed   By: Simonne Come M.D.   On: 04/02/2017 13:39   Ct Abdomen Pelvis W Contrast  Result Date: 03/21/2017 CLINICAL DATA:  Lower abdominal pain and rectal pain starting 2 days ago. History of rectal abscesses. EXAM: CT ABDOMEN AND PELVIS WITH CONTRAST TECHNIQUE: Multidetector CT imaging of the abdomen and pelvis was performed using the standard protocol following bolus administration of intravenous contrast. CONTRAST:  ISOVUE-300 IOPAMIDOL (ISOVUE-300) INJECTION 61% COMPARISON:  June 03, 2015 FINDINGS: Lower chest: No acute abnormality. Hepatobiliary: Low-attenuation adjacent to the falciform ligament is thought to be focal  fatty deposition, mildly more prominent since 2016. No suspicious liver masses are identified. The gallbladder is normal in appearance. The portal vein is normal. Pancreas:  Unremarkable. No pancreatic ductal dilatation or surrounding inflammatory changes. Spleen: Normal in size without focal abnormality. Adrenals/Urinary Tract: The adrenal glands, kidneys, and ureter are normal. The bladder is poorly distended limiting evaluation. Stomach/Bowel: The stomach and small bowel are normal. The lower rectum appears to be thick walled an inflamed. The remainder of the colon is normal. Visualized appendix demonstrates no evidence of appendicitis. There is a small amount of fluid adjacent to the appendix but this is thought to be secondary to the process which will be described in the pelvis below. The appendix itself is nondilated with no evidence of inflammation. Vascular/Lymphatic: No significant vascular findings are present. No enlarged abdominal or pelvic lymph nodes. Reproductive: The uterus is normal. Several follicles are seen in both ovaries. Other: No free air. There is a fluid collection containing a few foci of air intimately associated with the posterior aspect of the rectum as seen on axial image 77 measuring 4.5 x 4.4 x 4.1 cm. There is another fluid collection in the posterior left pelvis on series 2, image 69 measuring 4.8 by 3.7 cm. There is free fluid in the pelvis as well with no other drainable abscess identified. Musculoskeletal: No acute or significant osseous findings. IMPRESSION: 1. There are 2 abscesses in the pelvis. The largest is intimately associated with the posterior wall of the rectum and there is adjacent rectal inflammation. The other is in the left posterior pelvis as above. There is also some free fluid in the pelvis but no other drainable abscess. 2. A small amount of fluid around a normal caliber appendix is thought to be reactive from the process in the pelvis. There is no evidence of appendicitis. 3. No other acute abnormalities are seen. Electronically Signed   By: Gerome Sam III M.D   On: 03/21/2017 13:48   Dg Sinus/fist Tube Chk-non  Gi  Result Date: 04/02/2017 CLINICAL DATA:  History of recurrent perirectal and pelvic abscesses, post percutaneous drainage catheter placement on 03/22/2017 by Dr. Loreta Ave. Note, patient underwent left-sided percutaneous drainage catheter placement on 05/31/2015 by Dr. Fredia Sorrow. Patient presents to the Interventional Radiology drain Clinic for percutaneous drainage catheter evaluation and management The patient continues to flush the percutaneous drainage catheter daily. She reports minimal to no output from the drainage catheter. The patient continues on her prescribed course of augmentation. EXAM: ABSCESS INJECTION COMPARISON:  CT abdomen pelvis - earlier same day ; 03/21/2017; CT-guided percutaneous drainage catheter placement - 03/22/2017; 05/31/2015 CONTRAST:  20 cc Omnipaque 300 - administered via the existing left trans gluteal approach percutaneous drainage catheter FLUOROSCOPY TIME:  48 seconds (166 mGy) TECHNIQUE: The patient was positioned prone on the fluoroscopy table. A preprocedural spot fluoroscopic image was obtained of the left hemipelvis and existing percutaneous drainage catheter Multiple spot fluoroscopic images were obtained in various obliquities following the injection of a small amount of contrast via the existing percutaneous drainage catheter. Images were reviewed and the decision was made to remove the percutaneous drainage catheter. As such, the external portion of the drainage catheter was cut and drainage catheter was removed intact. FINDINGS: Preprocedural spot fluoroscopic image demonstrates unchanged positioning of left trans gluteal approach percutaneous drainage catheter with end coiled and locked within the left hemipelvis. Excreted contrast is seen within urinary bladder. Contrast injection demonstrates opacification of the decompressed abscess cavity without evidence of residual fistulous connection  to the adjacent bowel. IMPRESSION: No evidence of fistulous connection  between the decompressed abscess cavity and the adjacent bowel. Given resolution of the perirectal and pelvic abscesses as well as lack of fistulous connection, the percutaneous drainage catheter was removed. PLAN: - The patient was encouraged to complete her prescribed course of Augmentin. - The patient was encouraged to keep her follow-up appointments with [facility]. Electronically Signed   By: Simonne Come M.D.   On: 04/02/2017 14:16   Ct Image Guided Drainage By Percutaneous Catheter  Result Date: 03/22/2017 INDICATION: 36 year old female with a history of recurrent abscess in the left pelvis EXAM: CT GUIDED DRAINAGE OF PELVIC ABSCESS MEDICATIONS: The patient is currently admitted to the hospital and receiving intravenous antibiotics. The antibiotics were administered within an appropriate time frame prior to the initiation of the procedure. ANESTHESIA/SEDATION: 2.0 mg IV Versed 100 mcg IV Fentanyl Moderate Sedation Time:  16 minutes The patient was continuously monitored during the procedure by the interventional radiology nurse under my direct supervision. COMPLICATIONS: None TECHNIQUE: Informed written consent was obtained from the patient after a thorough discussion of the procedural risks, benefits and alternatives. All questions were addressed. Maximal Sterile Barrier Technique was utilized including caps, mask, sterile gowns, sterile gloves, sterile drape, hand hygiene and skin antiseptic. A timeout was performed prior to the initiation of the procedure. PROCEDURE: The operative field was prepped with chlorhexidine in a sterile fashion, and a sterile drape was applied covering the operative field. A sterile gown and sterile gloves were used for the procedure. Local anesthesia was provided with 1% Lidocaine. Patient position prone position on the CT gantry table and a scout CT was performed for planning purposes. Once the patient is prepped and draped in the usual sterile fashion, the  skin and subcutaneous tissues were generously infiltrated 1% lidocaine for local anesthesia. Using CT guidance, 18 gauge trocar needle was advanced into the abscess in left pelvis confirming the location with aspiration of small amount of purulent material. Using modified Seldinger technique, a 10 French drain was placed in the cavity. Approximately 30 cc of frankly purulent material aspirated. Sample sent the lab for analysis. Drain was attached to ball drainage. Sutured in place. Its bandage was placed. Patient tolerated the procedure well and remained hemodynamically stable throughout. No complications were encountered and no significant blood loss. FINDINGS: Scout CT in the prone position demonstrates abscess in the left pelvis, which has been previously drained, 05/31/2015. Abscess measures approximately 4.9 cm. In this prone position, there was no persistent significant perirectal fluid which was present on the prior CT. No safe window was identified to the small volume of fluid in this site at this time. Given the reduced volume of this fluid, this may represent a combination of reactive ascitic fluid and possible infection. Given no safe window, aspiration/drainage not attempted at this time. Hyperdense material in the presacral space, potentially granuloma formation or thrombosed veins. IMPRESSION: Status post CT-guided drainage of left pelvic abscess. Approximately 30 cc of frankly purulent material aspirated. Signed, Yvone Neu. Loreta Ave, DO [facility] Electronically Signed   By: Gilmer Mor D.O.   On: 03/22/2017 11:26    Labs:  CBC:  Recent Labs  03/21/17 1003 03/22/17 0404 03/23/17 0505  WBC 10.4 12.0* 8.9  HGB 12.0 11.4* 10.3*  HCT 35.8* 34.1* 31.4*  PLT 141* 124* 128*    COAGS:  Recent Labs  03/22/17 0404  INR 1.05    BMP:  Recent  Labs  03/21/17 1003 03/22/17 0404 03/23/17 0505  NA 138 137 138  K 3.7 3.6 3.6   CL 106 106 106  CO2 26 23 27   GLUCOSE 83 107* 109*  BUN 7 7 <5*  CALCIUM 8.6* 8.0* 8.2*  CREATININE 0.59 0.47 0.57  GFRNONAA >60 >60 >60  GFRAA >60 >60 >60    LIVER FUNCTION TESTS:  Recent Labs  03/23/17 0505  BILITOT 0.6  AST 10*  ALT 7*  ALKPHOS 40  PROT 6.0*  ALBUMIN 3.0*    TUMOR MARKERS: No results for input(s): AFPTM, CEA, CA199, CHROMGRNA in the last 8760 hours.  Assessment and Plan:  Stephanie [name] is a 36 y.o. female with past medical history significant for depression, diabetes, anxiety and schizophrenia who developed a recurrent perirectal pain with CT scan of the abdomen pelvis performed 03/21/2017 demonstrating a recurrent perirectal and left pelvic abscesses for which the patient underwent placement of a CT-guided drainage catheter on 03/22/2017 by Dr. Loreta AveWagner.   She continues to flush the percutaneous drainage catheter. She reports minimal to no output from the percutaneous drainage catheter.   Review of CT scan of the abdomen and pelvis performed earlier today demonstrates complete resolution of both the perirectal and left hemipelvic abscesses. Contrast injection was negative for the presence of a fistulous connection between the decompressed abscess cavity and the adjacent intestines.  Given lack of output from the percutaneous drainage catheter as well as resolution on imaging, the percutaneous drainage catheter was removed uneventfully at the patient's bedside.   The patient was encouraged to complete her prescribed course of Augmentin and to keep her follow-up appointments with [facility].  Patient was encouraged to call the interventional radiology clinic with any future questions or concerns only otherwise follow-up on a PRN basis.  A copy of this report was sent to the requesting provider on this date.  Electronically Signed: Simonne ComeWATTS, Meerab Maselli 04/02/2017, 2:36 PM   I spent a total of 10 Minutes in face to face in clinical consultation,  greater than 50% of which was counseling/coordinating care for drainage catheter evaluation and management

## 2017-04-12 ENCOUNTER — Encounter: Payer: Self-pay | Admitting: Gastroenterology

## 2017-06-11 ENCOUNTER — Ambulatory Visit: Payer: Medicaid Other | Admitting: Gastroenterology

## 2017-07-08 ENCOUNTER — Encounter: Payer: Self-pay | Admitting: Interventional Radiology

## 2017-07-08 ENCOUNTER — Other Ambulatory Visit: Payer: Self-pay | Admitting: General Surgery

## 2017-07-08 DIAGNOSIS — K611 Rectal abscess: Secondary | ICD-10-CM

## 2017-07-16 ENCOUNTER — Ambulatory Visit
Admission: RE | Admit: 2017-07-16 | Discharge: 2017-07-16 | Disposition: A | Discharge status: Home / Self Care | Payer: Medicaid Other | Source: Ambulatory Visit | Attending: General Surgery | Admitting: General Surgery

## 2017-07-16 DIAGNOSIS — K611 Rectal abscess: Secondary | ICD-10-CM

## 2017-07-16 MED ORDER — IOPAMIDOL (ISOVUE-300) INJECTION 61%
100.0000 mL | Freq: Once | INTRAVENOUS | Status: AC | PRN
  Administered 2017-07-16: 100 mL via INTRAVENOUS

## 2017-07-19 ENCOUNTER — Emergency Department (HOSPITAL_COMMUNITY)
Admission: EM | Admit: 2017-07-19 | Discharge: 2017-07-20 | Disposition: A | Discharge status: Home / Self Care | Payer: Medicaid Other | Attending: Emergency Medicine | Admitting: Emergency Medicine

## 2017-07-19 ENCOUNTER — Encounter (HOSPITAL_COMMUNITY): Payer: Self-pay | Admitting: *Deleted

## 2017-07-19 DIAGNOSIS — R188 Other ascites: Secondary | ICD-10-CM | POA: Insufficient documentation

## 2017-07-19 DIAGNOSIS — F209 Schizophrenia, unspecified: Secondary | ICD-10-CM | POA: Insufficient documentation

## 2017-07-19 DIAGNOSIS — F17210 Nicotine dependence, cigarettes, uncomplicated: Secondary | ICD-10-CM | POA: Insufficient documentation

## 2017-07-19 DIAGNOSIS — M545 Low back pain, unspecified: Secondary | ICD-10-CM

## 2017-07-19 DIAGNOSIS — Z79899 Other long term (current) drug therapy: Secondary | ICD-10-CM | POA: Insufficient documentation

## 2017-07-19 LAB — URINALYSIS, ROUTINE W REFLEX MICROSCOPIC
BILIRUBIN URINE: NEGATIVE
GLUCOSE, UA: NEGATIVE mg/dL
Ketones, ur: NEGATIVE mg/dL
NITRITE: NEGATIVE
PH: 6 (ref 5.0–8.0)
Protein, ur: 30 mg/dL — AB
SPECIFIC GRAVITY, URINE: 1.027 (ref 1.005–1.030)

## 2017-07-19 LAB — CBC
HCT: 37.3 % (ref 36.0–46.0)
Hemoglobin: 11.8 g/dL — ABNORMAL LOW (ref 12.0–15.0)
MCH: 33.1 pg (ref 26.0–34.0)
MCHC: 31.6 g/dL (ref 30.0–36.0)
MCV: 104.5 fL — ABNORMAL HIGH (ref 78.0–100.0)
PLATELETS: 162 10*3/uL (ref 150–400)
RBC: 3.57 MIL/uL — AB (ref 3.87–5.11)
RDW: 13.2 % (ref 11.5–15.5)
WBC: 6.9 10*3/uL (ref 4.0–10.5)

## 2017-07-19 LAB — COMPREHENSIVE METABOLIC PANEL
ALT: 11 U/L — ABNORMAL LOW (ref 14–54)
AST: 17 U/L (ref 15–41)
Albumin: 3.6 g/dL (ref 3.5–5.0)
Alkaline Phosphatase: 43 U/L (ref 38–126)
Anion gap: 8 (ref 5–15)
BILIRUBIN TOTAL: 0.1 mg/dL — AB (ref 0.3–1.2)
BUN: 11 mg/dL (ref 6–20)
CO2: 24 mmol/L (ref 22–32)
CREATININE: 0.67 mg/dL (ref 0.44–1.00)
Calcium: 8.4 mg/dL — ABNORMAL LOW (ref 8.9–10.3)
Chloride: 107 mmol/L (ref 101–111)
Glucose, Bld: 120 mg/dL — ABNORMAL HIGH (ref 65–99)
POTASSIUM: 3.5 mmol/L (ref 3.5–5.1)
Sodium: 139 mmol/L (ref 135–145)
TOTAL PROTEIN: 6.3 g/dL — AB (ref 6.5–8.1)

## 2017-07-19 LAB — DIFFERENTIAL
Basophils Absolute: 0 10*3/uL (ref 0.0–0.1)
Basophils Relative: 0 %
EOS ABS: 0.1 10*3/uL (ref 0.0–0.7)
Eosinophils Relative: 2 %
LYMPHS ABS: 3.4 10*3/uL (ref 0.7–4.0)
Lymphocytes Relative: 47 %
MONO ABS: 0.6 10*3/uL (ref 0.1–1.0)
MONOS PCT: 8 %
Neutro Abs: 3.1 10*3/uL (ref 1.7–7.7)
Neutrophils Relative %: 43 %

## 2017-07-19 LAB — PREGNANCY, URINE: Preg Test, Ur: NEGATIVE

## 2017-07-19 LAB — LIPASE, BLOOD: Lipase: 28 U/L (ref 11–51)

## 2017-07-19 MED ORDER — LORAZEPAM 1 MG PO TABS
1.0000 mg | ORAL_TABLET | Freq: Once | ORAL | Status: AC
  Administered 2017-07-19: 1 mg via ORAL
  Filled 2017-07-19: qty 1

## 2017-07-19 MED ORDER — OXYCODONE HCL 5 MG PO TABS: 5.0000 mg | ORAL_TABLET | Freq: Three times a day (TID) | ORAL | 0 refills | Status: DC | PRN

## 2017-07-19 MED ORDER — NAPROXEN 500 MG PO TABS: 500.0000 mg | ORAL_TABLET | Freq: Two times a day (BID) | ORAL | 0 refills | Status: DC

## 2017-07-19 MED ORDER — OXYCODONE-ACETAMINOPHEN 5-325 MG PO TABS
2.0000 | ORAL_TABLET | Freq: Once | ORAL | Status: AC
Start: 2017-07-19 — End: 2017-07-19
  Administered 2017-07-19: 2 via ORAL
  Filled 2017-07-19: qty 2

## 2017-07-19 MED ORDER — KETOROLAC TROMETHAMINE 30 MG/ML IJ SOLN
30.0000 mg | Freq: Once | INTRAMUSCULAR | Status: AC
  Administered 2017-07-19: 30 mg via INTRAVENOUS
  Filled 2017-07-19: qty 1

## 2017-07-19 NOTE — ED Triage Notes (Addendum)
Pt reports having an abdominal drain for an abcess in July. Pt states that she has had continued pain. Pt found out that she has another abscess on her left pelvis/lower back. Pt reports nausea.

## 2017-07-19 NOTE — Discharge Instructions (Signed)
It is possible that the fluid seen on your CT scan is representative of a seroma, though an abscess or infection is also possible. For this reason, we strongly advise that you see your general surgeon as soon as you are able for repeat assessment. Call in the morning, or on Monday, to schedule a follow up appointment.  For pain control, we recommend that you take naproxen as prescribed. You may take oxycodone for moderate to severe pain. Return to the ED for new or concerning symptoms.

## 2017-07-19 NOTE — ED Provider Notes (Signed)
[facility] EMERGENCY DEPARTMENT Provider Note   CSN: [id number] Arrival date & time: 07/19/17  1726    History   Chief Complaint No chief complaint on file.   HPI Stephanie [name] is a 36 y.o. female.  36 year old female with a history of diabetes mellitus, panic attacks, and schizophrenia presents to the emergency department for complaints of left-sided low back pain. She states that she has had similar pain over the past 4 months, since being admitted for a perirectal abscess. She states that she had a drain in place and pain has gradually been worsening since the drain was removed. She states that pain is mostly present in her left flank. She denies any modifying factors of her symptoms. She has taken tramadol in the past without relief. She denies any known fevers. She felt nauseous 2 days ago, but this has subsided. No pain with defecation. No urinary symptoms. Patient is currently on her menstrual cycle. She has been being followed by Dr. Bonnee Quin of general surgery for her perirectal abscess. He sent her for a repeat CT scan 3 days ago which showed reaccumulation of fluid at site of prior perirectal abscess.      Past Medical History:  Diagnosis Date  . Anemia   . Depression   . Diabetes mellitus    gestational  . H/O cesarean section 2010  . Hemorrhoids   . Panic attacks   . Schizophrenia (HCC)   . Vaginal delivery 2004 , 2008, 2009    Patient Active Problem List   Diagnosis Date Noted  . Pelvic abscess in female 03/21/2017  . Perirectal abscess 05/28/2015  . Internal hemorrhoids - 3 column 05/01/2013    Past Surgical History:  Procedure Laterality Date  . CESAREAN SECTION    . INCISION AND DRAINAGE PERIRECTAL ABSCESS N/A 05/28/2015   Procedure: IRRIGATION AND DEBRIDEMENT PERIRECTAL ABSCESS;  Surgeon: Darnell Level, MD;  Location: [facility]S;  Service: General;  Laterality: N/A;  . IR RADIOLOGIST EVAL & MGMT  04/02/2017  . TUBAL LIGATION  2010     OB History    Gravida Para Term Preterm AB Living   4 4 3 1   5    SAB TAB Ectopic Multiple Live Births                   Home Medications    Prior to Admission medications   Medication Sig Start Date End Date Taking? Authorizing Provider  acetaminophen (TYLENOL) 500 MG tablet Take 1,000 mg by mouth every 6 (six) hours as needed for mild pain, moderate pain, fever or headache.    Yes [provider]  lidocaine (XYLOCAINE) 5 % ointment Apply 1 application topically 4 (four) times daily as needed for mild pain.   Yes [provider]  sertraline (ZOLOFT) 50 MG tablet Take 50 mg by mouth daily. 07/15/17  Yes [provider]  naproxen (NAPROSYN) 500 MG tablet Take 1 tablet (500 mg total) by mouth 2 (two) times daily. 07/19/17   Antony Madura, PA-C  oxyCODONE (OXY IR/ROXICODONE) 5 MG immediate release tablet Take 1-2 tablets (5-10 mg total) by mouth every 8 (eight) hours as needed for moderate pain or severe pain (5mg  for moderate pain, 10mg  for severe pain). 07/19/17   Antony Madura, PA-C  sodium chloride flush (NS) 0.9 % SOLN 5 mLs by Other route daily. Flush drain with 5 cc saline daily. Patient not taking: Reported on 07/19/2017 03/25/17   Adam Phenix, PA-C  triamterene-hydrochlorothiazide (MAXZIDE-25) 37.5-25 MG tablet Take 1 tablet by mouth daily. 07/15/17   [provider]    Family History Family History  Problem Relation Age of Onset  . Hypertension Mother   . Hypertension Other     Social History Social History  Substance Use Topics  . Smoking status: Current Every Day Smoker    Packs/day: 0.50    Types: Cigarettes  . Smokeless tobacco: Never Used  . Alcohol use Yes     Comment: ocassionally     Allergies   Patient has no known allergies.   Review of Systems Review of Systems Ten systems reviewed and are negative for acute change, except as noted in the HPI.    Physical Exam Updated Vital Signs BP (!) 119/92    Pulse (!) 59   Temp 98.2 F (36.8 C) (Oral)   Resp 18   Ht 5\' 6"  (1.676 m)   Wt 79.4 kg (175 lb)   LMP 07/18/2017   SpO2 100%   BMI 28.25 kg/m   Physical Exam  Constitutional: She is oriented to person, place, and time. She appears well-developed and well-nourished. No distress.  Nontoxic and in NAD. Anxious, tearful.  HENT:  Head: Normocephalic and atraumatic.  Eyes: Conjunctivae and EOM are normal. No scleral icterus.  Neck: Normal range of motion.  Pulmonary/Chest: Effort normal. No respiratory distress.  Respirations even and unlabored  Genitourinary:  Genitourinary Comments: No perianal induration, fluctuance, or erythema. Normal rectal tone.   Musculoskeletal: Normal range of motion.  Neurological: She is alert and oriented to person, place, and time. She exhibits normal muscle tone. Coordination normal.  Skin: Skin is warm and dry. No rash noted. No erythema. No pallor.  Psychiatric: Her behavior is normal. Her mood appears anxious.  Nursing note and vitals reviewed.    ED Treatments / Results  Labs (all labs ordered are listed, but only abnormal results are displayed) Labs Reviewed  COMPREHENSIVE METABOLIC PANEL - Abnormal; Notable for the following:       Result Value   Glucose, Bld 120 (*)    Calcium 8.4 (*)    Total Protein 6.3 (*)    ALT 11 (*)    Total Bilirubin 0.1 (*)    All other components within normal limits  CBC - Abnormal; Notable for the following:    RBC 3.57 (*)    Hemoglobin 11.8 (*)    MCV 104.5 (*)    All other components within normal limits  URINALYSIS, ROUTINE W REFLEX MICROSCOPIC - Abnormal; Notable for the following:    APPearance HAZY (*)    Hgb urine dipstick LARGE (*)    Protein, ur 30 (*)    Leukocytes, UA MODERATE (*)    Bacteria, UA RARE (*)    Squamous Epithelial / LPF 0-5 (*)    All other components within normal limits  LIPASE, BLOOD  DIFFERENTIAL    EKG  EKG Interpretation None       Radiology No results  found.   Ct Abdomen Pelvis W Contrast  Result Date: 07/16/2017 CLINICAL DATA:  Follow-up for perirectal abscess. EXAM: CT ABDOMEN AND PELVIS WITH CONTRAST TECHNIQUE: Multidetector CT imaging of the abdomen and pelvis was performed using the standard protocol following bolus administration of intravenous contrast. CONTRAST:  100mL ISOVUE-300 IOPAMIDOL (ISOVUE-300) INJECTION 61% COMPARISON:  04/02/2017 FINDINGS: Lower chest:  Unremarkable. Hepatobiliary: Small area of low attenuation in the anterior liver, adjacent to the falciform ligament, is in a characteristic location for focal fatty  deposition. Liver otherwise unremarkable. No intrahepatic or extrahepatic biliary dilation. Pancreas: No focal mass lesion. No dilatation of the main duct. No intraparenchymal cyst. No peripancreatic edema. Spleen: No splenomegaly. No focal mass lesion. Adrenals/Urinary Tract: No adrenal nodule or mass. Kidneys are unremarkable. No evidence for hydroureter. The urinary bladder appears normal for the degree of distention. Stomach/Bowel: Stomach is nondistended. No gastric wall thickening. No evidence of outlet obstruction. Duodenum is normally positioned as is the ligament of Treitz. No small bowel wall thickening. No small bowel dilatation. The terminal ileum is normal. The appendix is normal. No gross colonic mass. No colonic wall thickening. No substantial diverticular change. Left perirectal fluid collection has reaccumulated, measuring 4.0 x 3.2 cm which compares to 4.8 x 3.7 cm on the exam of 03/13/2017. This represents the more cranial of the 2 fluid collections identified on the 03/21/2017 exam. The enhancing presacral nodular structures persist in may represent lymph nodes, granulation tissue or even varices given the similar attenuation to blood pool. The fluid collection seen posterior to the rectum on the 03/21/2017 exam is no longer evident. Vascular/Lymphatic: No abdominal aortic aneurysm. No abdominal aortic  atherosclerotic calcification. There is no gastrohepatic or hepatoduodenal ligament lymphadenopathy. No intraperitoneal or retroperitoneal lymphadenopathy. No pelvic sidewall lymphadenopathy. Reproductive: The uterus has normal CT imaging appearance. There is no adnexal mass. Other: No intraperitoneal free fluid. Musculoskeletal: Bone windows reveal no worrisome lytic or sclerotic osseous lesions. IMPRESSION: 1. Re-accumulation of left perirectal fluid collection, known to be abscess previously. This represents the more cranial of the 2 collections identified on the 03/21/2017 exam. The other fluid collection seen previously posterior to the rectum/anus has not re- accumulated. 2. Stable enhancing nodular structures in the presacral space, possibly lymph nodes are granulation tissue although vascular etiology not excluded. These are stable comparing back to an exam from 05/28/2015. Electronically Signed   By: Kennith Center M.D.   On: 07/16/2017 10:46    Procedures Procedures (including critical care time)  Medications Ordered in ED Medications  LORazepam (ATIVAN) tablet 1 mg (1 mg Oral Given 07/19/17 2200)  ketorolac (TORADOL) 30 MG/ML injection 30 mg (30 mg Intravenous Given 07/19/17 2249)  oxyCODONE-acetaminophen (PERCOCET/ROXICET) 5-325 MG per tablet 2 tablet (2 tablets Oral Given 07/19/17 2249)     Initial Impression / Assessment and Plan / ED Course  I have reviewed the triage vital signs and the nursing notes.  Pertinent labs & imaging results that were available during my care of the patient were reviewed by me and considered in my medical decision making (see chart for details).     70:69 PM 36 year old female presents to the emergency department for evaluation of left low back pain which is consistent with prior presentation of perirectal abscess. Patient was hospitalized for this at the end of June into the beginning of July. She is afebrile and denies fevers outpatient. Patient also  without leukocytosis or left shift. She did have an outpatient CT scan performed 3 days ago which shows reaccumulation of fluid at the site of prior perirectal abscess. Patient actively being followed by general surgery, specifically Dr. Abbey Chatters. Will consult with general surgery for recommendations on management.  Patient with hx of schizophrenia and anxiety. She is very anxious and tearful during my initial assessment. Ativan ordered.  10:25 PM Case discussed with Dr. Derrell Lolling of general surgery who has reviewed the patient's CT scan. He notes that the best approach for management may be transrectally versus IR. Dr. Derrell Lolling does acknowledge the size of  this fluid collection, stating that it may very well be responsible for the patient's discomfort; however, mentions that infectious etiology seems less favorable given lack of fever and leukocytosis today. These factors in conjunction with chronicity of discomfort are reassuring. Dr. Derrell Lolling believes that patient is appropriate for outpatient follow-up in the office. He recommends that patient NOT be given antibiotics at this time.   11:25 PM Patient reassessed. She is calm and states that she is feeling much better. She notes improvement to her pain after Toradol and oral Percocet. She expresses comfort with outpatient follow-up with her general surgeon. Return precautions discussed and provided. Patient discharged in stable condition with no unaddressed concerns.   Final Clinical Impressions(s) / ED Diagnoses   Final diagnoses:  Left-sided low back pain without sciatica, unspecified chronicity  Intraabdominal fluid collection    New Prescriptions New Prescriptions   NAPROXEN (NAPROSYN) 500 MG TABLET    Take 1 tablet (500 mg total) by mouth 2 (two) times daily.     Antony Madura, PA-C 07/19/17 2333    Lavera Guise, MD 07/20/17 Norberta Keens

## 2017-07-19 NOTE — ED Notes (Signed)
[place]oke with [place] during [place]. Patient upset over wait time, has bad back pain and is very anxious. Patient shared her history related to reason for visit today. Reviewed wait time with patient.  Patient given warm blanket for her back.

## 2017-07-22 ENCOUNTER — Other Ambulatory Visit (HOSPITAL_COMMUNITY): Payer: Self-pay | Admitting: General Surgery

## 2017-07-22 DIAGNOSIS — N739 Female pelvic inflammatory disease, unspecified: Secondary | ICD-10-CM

## 2017-07-22 LAB — I-STAT BETA HCG BLOOD, ED (MC, WL, AP ONLY): I-stat hCG, quantitative: <5 m[IU]/mL (ref <5)

## 2017-07-24 ENCOUNTER — Other Ambulatory Visit: Payer: Self-pay | Admitting: Radiology

## 2017-07-26 ENCOUNTER — Ambulatory Visit (HOSPITAL_COMMUNITY)
Admission: RE | Admit: 2017-07-26 | Discharge: 2017-07-26 | Disposition: A | Discharge status: Home / Self Care | Payer: Medicaid Other | Source: Ambulatory Visit | Attending: General Surgery | Admitting: General Surgery

## 2017-07-26 ENCOUNTER — Encounter (HOSPITAL_COMMUNITY): Payer: Self-pay

## 2017-07-26 DIAGNOSIS — E119 Type 2 diabetes mellitus without complications: Secondary | ICD-10-CM | POA: Diagnosis not present

## 2017-07-26 DIAGNOSIS — F17210 Nicotine dependence, cigarettes, uncomplicated: Secondary | ICD-10-CM | POA: Insufficient documentation

## 2017-07-26 DIAGNOSIS — N739 Female pelvic inflammatory disease, unspecified: Secondary | ICD-10-CM | POA: Insufficient documentation

## 2017-07-26 DIAGNOSIS — F209 Schizophrenia, unspecified: Secondary | ICD-10-CM | POA: Insufficient documentation

## 2017-07-26 DIAGNOSIS — D649 Anemia, unspecified: Secondary | ICD-10-CM | POA: Insufficient documentation

## 2017-07-26 DIAGNOSIS — Z8249 Family history of ischemic heart disease and other diseases of the circulatory system: Secondary | ICD-10-CM | POA: Insufficient documentation

## 2017-07-26 DIAGNOSIS — F329 Major depressive disorder, single episode, unspecified: Secondary | ICD-10-CM | POA: Insufficient documentation

## 2017-07-26 DIAGNOSIS — Z79899 Other long term (current) drug therapy: Secondary | ICD-10-CM | POA: Diagnosis not present

## 2017-07-26 LAB — CBC WITH DIFFERENTIAL/PLATELET
Basophils Absolute: 0 10*3/uL (ref 0.0–0.1)
Basophils Relative: 0 %
EOS ABS: 0.1 10*3/uL (ref 0.0–0.7)
Eosinophils Relative: 2 %
HCT: 38.8 % (ref 36.0–46.0)
HEMOGLOBIN: 12.5 g/dL (ref 12.0–15.0)
LYMPHS ABS: 2.4 10*3/uL (ref 0.7–4.0)
Lymphocytes Relative: 42 %
MCH: 33.3 pg (ref 26.0–34.0)
MCHC: 32.2 g/dL (ref 30.0–36.0)
MCV: 103.5 fL — ABNORMAL HIGH (ref 78.0–100.0)
Monocytes Absolute: 0.5 10*3/uL (ref 0.1–1.0)
Monocytes Relative: 9 %
NEUTROS PCT: 47 %
Neutro Abs: 2.7 10*3/uL (ref 1.7–7.7)
Platelets: 160 10*3/uL (ref 150–400)
RBC: 3.75 MIL/uL — ABNORMAL LOW (ref 3.87–5.11)
RDW: 13 % (ref 11.5–15.5)
WBC: 5.8 10*3/uL (ref 4.0–10.5)

## 2017-07-26 LAB — PROTIME-INR
INR: 0.85
Prothrombin Time: 11.5 seconds (ref 11.4–15.2)

## 2017-07-26 LAB — COMPREHENSIVE METABOLIC PANEL
ALBUMIN: 3.9 g/dL (ref 3.5–5.0)
ALK PHOS: 44 U/L (ref 38–126)
ALT: 15 U/L (ref 14–54)
AST: 19 U/L (ref 15–41)
Anion gap: 8 (ref 5–15)
BUN: 14 mg/dL (ref 6–20)
CALCIUM: 8.8 mg/dL — AB (ref 8.9–10.3)
CO2: 24 mmol/L (ref 22–32)
CREATININE: 0.59 mg/dL (ref 0.44–1.00)
Chloride: 109 mmol/L (ref 101–111)
GFR calc non Af Amer: >60 mL/min (ref >60)
GLUCOSE: 98 mg/dL (ref 65–99)
Potassium: 3.7 mmol/L (ref 3.5–5.1)
SODIUM: 141 mmol/L (ref 135–145)
Total Bilirubin: 0.6 mg/dL (ref 0.3–1.2)
Total Protein: 7.1 g/dL (ref 6.5–8.1)

## 2017-07-26 LAB — HCG, QUANTITATIVE, PREGNANCY: hCG, Beta Chain, Quant, S: <1 m[IU]/mL (ref <5)

## 2017-07-26 MED ORDER — MIDAZOLAM HCL 2 MG/2ML IJ SOLN
INTRAMUSCULAR | Status: AC
  Filled 2017-07-26: qty 4

## 2017-07-26 MED ORDER — SODIUM CHLORIDE 0.9% FLUSH: 5.0000 mL | Freq: Three times a day (TID) | INTRAVENOUS | Status: DC

## 2017-07-26 MED ORDER — FENTANYL CITRATE (PF) 100 MCG/2ML IJ SOLN
INTRAMUSCULAR | Status: AC | PRN
  Administered 2017-07-26 (×2): 50 ug via INTRAVENOUS

## 2017-07-26 MED ORDER — FENTANYL CITRATE (PF) 100 MCG/2ML IJ SOLN
INTRAMUSCULAR | Status: AC
  Filled 2017-07-26: qty 2

## 2017-07-26 MED ORDER — MIDAZOLAM HCL 2 MG/2ML IJ SOLN
INTRAMUSCULAR | Status: AC | PRN
  Administered 2017-07-26 (×3): 1 mg via INTRAVENOUS

## 2017-07-26 MED ORDER — SODIUM CHLORIDE 0.9 % IV SOLN
INTRAVENOUS | Status: DC
  Administered 2017-07-26: 07:00:00 via INTRAVENOUS

## 2017-07-26 MED ORDER — HYDROCODONE-ACETAMINOPHEN 5-325 MG PO TABS
1.0000 | ORAL_TABLET | ORAL | Status: DC | PRN
  Administered 2017-07-26: 1 via ORAL
  Filled 2017-07-26: qty 1

## 2017-07-26 MED ORDER — LORAZEPAM BOLUS VIA INFUSION: 1.0000 mg | Freq: Once | INTRAVENOUS | Status: DC

## 2017-07-26 MED ORDER — LORAZEPAM 2 MG/ML IJ SOLN
1.0000 mg | Freq: Once | INTRAMUSCULAR | Status: AC
  Administered 2017-07-26: 1 mg via INTRAVENOUS
  Filled 2017-07-26: qty 0.5

## 2017-07-26 NOTE — Progress Notes (Signed)
RN and Loyce DysKacie Matthews PA, in talking with patient, patient stating"nurse's, doctor's,Pa's, Ya'll don't care," I've called everyone and let them know how I've been treated today" I've called the doctor's office myself and they are calling me something in" , PA asking is there anything else that she can do to help her today," Ya'll see I'm having an anxiety attack", Medication ordered per PA.

## 2017-07-26 NOTE — Progress Notes (Signed)
PA has been to patient's room in short stay several times this morning to answer questions and clarify patient's needs.   Patient expresses frustration that she was scheduled for procedure as an outpatient. She states she does not feel that "we care about her." Discussed the process of her presentation to radiology department, explaining how she was diagnosed and added to the outpatient radiology schedule.  Patient states "I have 5 mental health disorders and you're just gonna treat me like this.  Clelia SchaumannGonna let somebody with anxiety just deal with this."  Reinforced that patient has had drain placements in the past and that she has been able to manage them at home.  Provided patient with instructions and supplies for drain.  Verified patient and family did not have any further questions and left room.   Was immediately notified by RN that patient had additional questions and promptly returned to room.  Patient states "y'all are treating me this way because I have medicaid."  Again, reassured patient that we are providing sincere care with careful regard to her best interests.  Asked patient directly "How can I help get you what you need?"  Patient states "You're not hearing me."  Spent ~20 minutes in room with patient listening, discussing case, confirming she had adequate instructions for drain care.  Patient asked about pain medication at home.  Instructed patient to use what she was provided in the ED and tylenol or ibuprofen as needed.  Also encouraged patient to call Dr. Maris Bergerosenbower's office to clarify her questions and continue to work towards understanding. Patient states "I shouldn't have to tell y'all what I need. For one of my drains I got home health.  I have 5 kids and anxiety, how can you say that to me? Why do I have call?  Just look in my chart.  You can see I'm in pain."  Asked patient if she was currently having pain to which she replies "No, they gave me something."  RN in room confirms patient was  given hydrocodone after returning to unit post-procedure.  Family member in room states "Don't worry, I'll get you good pain medication."  Patient deflects direct questions such as "what do you take for your anxiety" and responds with frustration "these doctors know that Zoloft does nothing for me and makes me cry." Returned to IR to discuss with Dr. Lowella DandyHenn who was agreeable to prescription pain medication for patient.  PA returned to room to provide prescription but patient on the phone with Dr. Maris Bergerosenbower's office and states "I got what I needed."  PA again listened empathetically to patient's concerns.  Offered IV anxiety medication as patient does have a driver home today.  Patient agrees.  Prior to leaving the room, PA again confirmed with patient and her family that she has everything she needs and that she has spoken with referring provider. Case was again discussed with Dr. Lowella DandyHenn who has spoken with Dr. Aleen Campiosebower since procedure this AM.  No antibiotics today- await cultures and see if any growth.  He will contact patient with antibiotics if needed. Patient is afebrile and without elevated WBC count today. Per Dr. Abbey Chattersosenbower, drain will be managed solely by him.  No need for IR drain clinic.  Patient given IV lorazepam.  Loyce DysKacie Youlanda Tomassetti, MS RD PA-C 1:12 PM

## 2017-07-26 NOTE — Progress Notes (Signed)
Stephanie [name] and RN in room with patient and patient's family, supplies given for drain, instructions given for patient to flush drain with 5ml of saline daily with understanding verbalized per patient and patient's family. Patient instructed to take tylenol for pain.

## 2017-07-26 NOTE — Progress Notes (Signed)
Pt. Requesting pain medication to be called in, Patient instructed to call her primary care doctor for pain medication, that pain medication is not allowed to be called in that you have to see your doctor, patient started crying and told Rn "ya'll don't care about me", "I need to speak to someone", Loyce DysKacie Matthews back in room with RN and patient. Patient continues to cry and complain about the staff not caring. Patient stating" ya'll put the drain in". " I shouldn't have to call anyone"

## 2017-07-26 NOTE — Procedures (Signed)
CT guided placement of left perirectal drain.  Removed 15 ml of white/gray thick fluid.  Sent fluid for culture.  Minimal blood loss and no immediate complication.

## 2017-07-26 NOTE — Progress Notes (Signed)
Patient stated " I always act like this when I come to the hospital"

## 2017-07-26 NOTE — H&P (Signed)
Chief Complaint: Patient was seen in consultation today for pelvic abscess  Referring Physician(s): Rosenbower,Todd  Supervising Physician: Richarda Overlie  Patient Status: [facility] - Out-pt  History of Present Illness: Stephanie [name] is a 36 y.o. female with past medical history of DM, anemia, hemorrhoids, depression, panic attacks, schizophrenia who presents with complaint of peri-rectal abscess. This problem is recurrent and she has had drain placement in past.  Approximately 1 week ago she began having peri-rectal pressure and presented to the ED.    CT Abdomen/Pelvis 07/16/17: 1. Re-accumulation of left perirectal fluid collection, known to be abscess previously. This represents the more cranial of the 2 collections identified on the 03/21/2017 exam. The other fluid collection seen previously posterior to the rectum/anus has not re- accumulated. 2. Stable enhancing nodular structures in the presacral space, possibly lymph nodes are granulation tissue although vascular etiology not excluded. These are stable comparing back to an exam from 05/28/2015.  IR consulted for peri-rectal abscess drain placement at the request of Dr. Abbey Chatters.  Patient has been NPO.  She does not take blood thinners.   Past Medical History:  Diagnosis Date  . Anemia   . Depression   . Diabetes mellitus    gestational  . H/O cesarean section 2010  . Hemorrhoids   . Panic attacks   . Schizophrenia (HCC)   . Vaginal delivery 2004 , 2008, 2009    Past Surgical History:  Procedure Laterality Date  . CESAREAN SECTION    . INCISION AND DRAINAGE PERIRECTAL ABSCESS N/A 05/28/2015   Procedure: IRRIGATION AND DEBRIDEMENT PERIRECTAL ABSCESS;  Surgeon: Darnell Level, MD;  Location: [facility] ORS;  Service: General;  Laterality: N/A;  . IR RADIOLOGIST EVAL & MGMT  04/02/2017  . TUBAL LIGATION  2010    Allergies: Patient has no known allergies.  Medications: Prior to Admission medications   Medication Sig  Start Date End Date Taking? Authorizing Provider  acetaminophen (TYLENOL) 500 MG tablet Take 1,000 mg by mouth every 6 (six) hours as needed for mild pain, moderate pain, fever or headache.    Yes [provider]  naproxen (NAPROSYN) 500 MG tablet Take 1 tablet (500 mg total) by mouth 2 (two) times daily. 07/19/17  Yes Antony Madura, PA-C  oxyCODONE (OXY IR/ROXICODONE) 5 MG immediate release tablet Take 1-2 tablets (5-10 mg total) by mouth every 8 (eight) hours as needed for moderate pain or severe pain (5mg  for moderate pain, 10mg  for severe pain). 07/19/17  Yes Antony Madura, PA-C  lidocaine (XYLOCAINE) 5 % ointment Apply 1 application topically 4 (four) times daily as needed for mild pain. Pt. Do not receive    [provider]  sertraline (ZOLOFT) 50 MG tablet Take 50 mg by mouth daily. Pt. Is not taking. 07/15/17   [provider]  sodium chloride flush (NS) 0.9 % SOLN 5 mLs by Other route daily. Flush drain with 5 cc saline daily. Patient not taking: Reported on 07/19/2017 03/25/17   Adam Phenix, PA-C  triamterene-hydrochlorothiazide (MAXZIDE-25) 37.5-25 MG tablet Take 1 tablet by mouth daily. 07/15/17   [provider]     Family History  Problem Relation Age of Onset  . Hypertension Mother   . Hypertension Other     Social History   Social History  . Marital status: Single    Spouse name: N/A  . Number of children: N/A  . Years of education: N/A   Social History Main Topics  . Smoking status: Current Every Day Smoker  Packs/day: 0.50    Types: Cigarettes  . Smokeless tobacco: Never Used  . Alcohol use Yes     Comment: ocassionally  . Drug use: Yes    Frequency: 3.0 times per week    Types: Marijuana  . Sexual activity: Yes   Other Topics Concern  . None   Social History Narrative  . None    Review of Systems  Constitutional: Negative for fatigue and fever.  Respiratory: Negative for cough and shortness of breath.     Cardiovascular: Negative for chest pain.  Gastrointestinal: Negative for abdominal pain.  Musculoskeletal: Negative for back pain.  Psychiatric/Behavioral: Negative for behavioral problems and confusion.    Vital Signs: BP 139/86   Pulse 80   Temp 98.2 F (36.8 C) (Oral)   Resp 20   Ht 5\' 6"  (1.676 m)   Wt 175 lb (79.4 kg)   LMP 07/18/2017   SpO2 100%   BMI 28.25 kg/m   Physical Exam  Constitutional: She is oriented to person, place, and time. She appears well-developed.  Cardiovascular: Normal rate, regular rhythm and normal heart sounds.   Pulmonary/Chest: Effort normal and breath sounds normal. No respiratory distress.  Abdominal: Soft.  Neurological: She is alert and oriented to person, place, and time.  Skin: Skin is warm and dry.  Psychiatric: She has a normal mood and affect. Her behavior is normal. Judgment and thought content normal.  Nursing note and vitals reviewed.   Imaging: Ct Abdomen Pelvis W Contrast  Result Date: 07/16/2017 CLINICAL DATA:  Follow-up for perirectal abscess. EXAM: CT ABDOMEN AND PELVIS WITH CONTRAST TECHNIQUE: Multidetector CT imaging of the abdomen and pelvis was performed using the standard protocol following bolus administration of intravenous contrast. CONTRAST:  ISOVUE-300 IOPAMIDOL (ISOVUE-300) INJECTION 61% COMPARISON:  04/02/2017 FINDINGS: Lower chest:  Unremarkable. Hepatobiliary: Small area of low attenuation in the anterior liver, adjacent to the falciform ligament, is in a characteristic location for focal fatty deposition. Liver otherwise unremarkable. No intrahepatic or extrahepatic biliary dilation. Pancreas: No focal mass lesion. No dilatation of the main duct. No intraparenchymal cyst. No peripancreatic edema. Spleen: No splenomegaly. No focal mass lesion. Adrenals/Urinary Tract: No adrenal nodule or mass. Kidneys are unremarkable. No evidence for hydroureter. The urinary bladder appears normal for the degree of distention.  Stomach/Bowel: Stomach is nondistended. No gastric wall thickening. No evidence of outlet obstruction. Duodenum is normally positioned as is the ligament of Treitz. No small bowel wall thickening. No small bowel dilatation. The terminal ileum is normal. The appendix is normal. No gross colonic mass. No colonic wall thickening. No substantial diverticular change. Left perirectal fluid collection has reaccumulated, measuring 4.0 x 3.2 cm which compares to 4.8 x 3.7 cm on the exam of 03/13/2017. This represents the more cranial of the 2 fluid collections identified on the 03/21/2017 exam. The enhancing presacral nodular structures persist in may represent lymph nodes, granulation tissue or even varices given the similar attenuation to blood pool. The fluid collection seen posterior to the rectum on the 03/21/2017 exam is no longer evident. Vascular/Lymphatic: No abdominal aortic aneurysm. No abdominal aortic atherosclerotic calcification. There is no gastrohepatic or hepatoduodenal ligament lymphadenopathy. No intraperitoneal or retroperitoneal lymphadenopathy. No pelvic sidewall lymphadenopathy. Reproductive: The uterus has normal CT imaging appearance. There is no adnexal mass. Other: No intraperitoneal free fluid. Musculoskeletal: Bone windows reveal no worrisome lytic or sclerotic osseous lesions. IMPRESSION: 1. Re-accumulation of left perirectal fluid collection, known to be abscess previously. This represents the more cranial of the  2 collections identified on the 03/21/2017 exam. The other fluid collection seen previously posterior to the rectum/anus has not re- accumulated. 2. Stable enhancing nodular structures in the presacral space, possibly lymph nodes are granulation tissue although vascular etiology not excluded. These are stable comparing back to an exam from 05/28/2015. Electronically Signed   By: Kennith CenterEric  Mansell M.D.   On: 07/16/2017 10:46    Labs:  CBC:  Recent Labs  03/22/17 0404  03/23/17 0505 07/19/17 1804 07/26/17 0651  WBC 12.0* 8.9 6.9 5.8  HGB 11.4* 10.3* 11.8* 12.5  HCT 34.1* 31.4* 37.3 38.8  PLT 124* 128* 162 160    COAGS:  Recent Labs  03/22/17 0404 07/26/17 0651  INR 1.05 0.85    BMP:  Recent Labs  03/22/17 0404 03/23/17 0505 07/19/17 1804 07/26/17 0651  NA 137 138 139 141  K 3.6 3.6 3.5 3.7  CL 106 106 107 109  CO2 23 27 24 24   GLUCOSE 107* 109* 120* 98  BUN 7 <5* 11 14  CALCIUM 8.0* 8.2* 8.4* 8.8*  CREATININE 0.47 0.57 0.67 0.59  GFRNONAA >60 >60 >60 >60  GFRAA >60 >60 >60 >60    LIVER FUNCTION TESTS:  Recent Labs  03/23/17 0505 07/19/17 1804 07/26/17 0651  BILITOT 0.6 0.1* 0.6  AST 10* 17 19  ALT 7* 11* 15  ALKPHOS 40 43 44  PROT 6.0* 6.3* 7.1  ALBUMIN 3.0* 3.6 3.9    TUMOR MARKERS: No results for input(s): AFPTM, CEA, CA199, CHROMGRNA in the last 8760 hours.  Assessment and Plan: Pelvic Abscess Patient with past medical history of hemorrhoids and peri-rectal abscess presents with complaint of recurrent abscess as demonstrated by CT imaging.  IR consulted for aspiration and drainage at the request of Dr. Abbey Chattersosenbower. Case reviewed by Dr. Miles CostainShick who approves patient for procedure.  Patient presents today in their usual state of health.  She has been NPO and is not currently on blood thinners.  Risks and benefits discussed with the patient including bleeding, infection, damage to adjacent structures, bowel perforation/fistula connection, and sepsis. All of the patient's questions were answered, patient is agreeable to proceed. Consent signed and in chart.  Thank you for this interesting consult.  I greatly enjoyed meeting Stephanie [name] and look forward to participating in their care.  A copy of this report was sent to the requesting provider on this date.  Electronically Signed: Hoyt KochKacie Sue-Ellen Samayra Hebel, PA 07/26/2017, 8:17 AM   I spent a total of    25 Minutes in face to face in clinical consultation,  greater than 50% of which was counseling/coordinating care for peri-rectal abscess.

## 2017-07-26 NOTE — Discharge Instructions (Signed)
Percutaneous Abscess Drain, Care After °This sheet gives you information about how to care for yourself after your procedure. Your health care provider may also give you more specific instructions. If you have problems or questions, contact your health care provider. °What can I expect after the procedure? °After your procedure, it is common to have: °· A small amount of bruising and discomfort in the area where the drainage tube (catheter) was placed. °· Sleepiness and fatigue. This should go away after the medicines you were given have worn off. ° °Follow these instructions at home: °Incision care °· Follow instructions from your health care provider about how to take care of your incision. Make sure you: °? Wash your hands with soap and water before you change your bandage (dressing). If soap and water are not available, use hand sanitizer. °? Change your dressing as told by your health care provider. °? Leave stitches (sutures), skin glue, or adhesive strips in place. These skin closures may need to stay in place for 2 weeks or longer. If adhesive strip edges start to loosen and curl up, you may trim the loose edges. Do not remove adhesive strips completely unless your health care provider tells you to do that. °· Check your incision area every day for signs of infection. Check for: °? More redness, swelling, or pain. °? More fluid or blood. °? Warmth. °? Pus or a bad smell. °? Fluid leaking from around your catheter (instead of fluid draining through your catheter). °Catheter care °· Follow instructions from your health care provider about emptying and cleaning your catheter and collection bag. You may need to clean the catheter every day so it does not clog. °· If directed, write down the following information every time you empty your bag: °? The date and time. °? The amount of drainage. °General instructions °· Rest at home for 1-2 days after your procedure. Return to your normal activities as told by your  health care provider. °· Do not take baths, swim, or use a hot tub for 24 hours after your procedure, or until your health care provider says that this is okay. °· Take over-the-counter and prescription medicines only as told by your health care provider. °· Keep all follow-up visits as told by your health care provider. This is important. °Contact a health care provider if: °· You have less than 10 mL of drainage a day for 2-3 days in a row, or as directed by your health care provider. °· You have more redness, swelling, or pain around your incision area. °· You have more fluid or blood coming from your incision area. °· Your incision area feels warm to the touch. °· You have pus or a bad smell coming from your incision area. °· You have fluid leaking from around your catheter (instead of through your catheter). °· You have a fever or chills. °· You have pain that does not get better with medicine. °Get help right away if: °· Your catheter comes out. °· You suddenly stop having drainage from your catheter. °· You suddenly have blood in the fluid that is draining from your catheter. °· You become dizzy or you faint. °· You develop a rash. °· You have nausea or vomiting. °· You have difficulty breathing or you feel short of breath. °· You develop chest pain. °· You have problems with your speech or vision. °· You have trouble balancing or moving your arms or legs. °Summary °· It is common to have a small   amount of bruising and discomfort in the area where the drainage tube (catheter) was placed. °· You may be directed to record the amount of drainage from the bag every time you empty it. °· Follow instructions from your health care provider about emptying and cleaning your catheter and collection bag. °This information is not intended to replace advice given to you by your health care provider. Make sure you discuss any questions you have with your health care provider. °Document Released: 01/25/2014 Document  Revised: 08/02/2016 Document Reviewed: 08/02/2016 °Elsevier Interactive Patient Education © 2017 [employer] °Moderate Conscious Sedation, Adult, Care After °These instructions provide you with information about caring for yourself after your procedure. Your health care provider may also give you more specific instructions. Your treatment has been planned according to current medical practices, but problems sometimes occur. Call your health care provider if you have any problems or questions after your procedure. °What can I expect after the procedure? °After your procedure, it is common: °· To feel sleepy for several hours. °· To feel clumsy and have poor balance for several hours. °· To have poor judgment for several hours. °· To vomit if you eat too soon. ° °Follow these instructions at home: °For at least 24 hours after the procedure: ° °· Do not: °? Participate in activities where you could fall or become injured. °? Drive. °? Use heavy machinery. °? Drink alcohol. °? Take sleeping pills or medicines that cause drowsiness. °? Make important decisions or sign legal documents. °? Take care of children on your own. °· Rest. °Eating and drinking °· Follow the diet recommended by your health care provider. °· If you vomit: °? Drink water, juice, or soup when you can drink without vomiting. °? Make sure you have little or no nausea before eating solid foods. °General instructions °· Have a responsible adult stay with you until you are awake and alert. °· Take over-the-counter and prescription medicines only as told by your health care provider. °· If you smoke, do not smoke without supervision. °· Keep all follow-up visits as told by your health care provider. This is important. °Contact a health care provider if: °· You keep feeling nauseous or you keep vomiting. °· You feel light-headed. °· You develop a rash. °· You have a fever. °Get help right away if: °· You have trouble breathing. °This information is not  intended to replace advice given to you by your health care provider. Make sure you discuss any questions you have with your health care provider. °Document Released: 07/01/2013 Document Revised: 02/13/2016 Document Reviewed: 12/31/2015 °Elsevier Interactive Patient Education © 2018 [employer]vier Inc. ° °

## 2017-07-29 ENCOUNTER — Telehealth (HOSPITAL_COMMUNITY): Payer: Self-pay | Admitting: *Deleted

## 2017-07-29 NOTE — Telephone Encounter (Signed)
Patient called multiple times this am.  [facility] that she was not referred to home health after the drain.  She was not satisfied with the explanation she received on Friday from the [place], [place], or the short stay staff regarding discharge information.  Tiffany, IR scheduler, attempted to explain that Dr Abbey Chattersosenbower was to manage her post drain placement care , including home if needed, per the notes in her chart.  She was not accepting that answer and repeatedly told Tiffany that she felt our staff was not doing our job.  Tiffany asked for a number that she could be reached at so Tiffany could have the PA investigate.  The patient called back before we could pass the information on to the PA and I spoke to her directly.  She said she had called Dr Maris Bergerosenbower's office and they told her that it was our job to manage her post drain care.    I explained that I saw the same note as Tiffany but I would follow up myself with the physician(s) and get back with her quickly.  I paged Dr Abbey Chattersosenbower.  Per Dr Abbey Chattersosenbower, he is to see the patient this afternoon at 1:30.  At that time he will evaluate her needs for home health, drain management and medication management.  I called patient back and passed on that information.  She was thankful for the quick response but also questioned how to get her discharge summary from Friday that she states she did not get but needed.  I told her Dr Maris Bergerosenbower's office should be able to provide any paperwork she needed at this point but to call me back after her appointment if he suggested any paperwork from us.  She also asked if her complaint for her care on Friday had been reviewed.  I referred her to the Office of Patient Experience  And provided the number.  She seemed appreciative.  I advised her to call me if she needed anything further from me.

## 2017-08-01 ENCOUNTER — Ambulatory Visit: Payer: Medicaid Other | Admitting: Gastroenterology

## 2017-08-01 LAB — AEROBIC/ANAEROBIC CULTURE W GRAM STAIN (SURGICAL/DEEP WOUND)

## 2017-08-03 ENCOUNTER — Telehealth: Payer: Self-pay | Admitting: Surgery

## 2017-08-03 ENCOUNTER — Telehealth: Payer: Self-pay | Admitting: General Surgery

## 2017-08-03 ENCOUNTER — Other Ambulatory Visit: Payer: Self-pay | Admitting: General Surgery

## 2017-08-03 DIAGNOSIS — N739 Female pelvic inflammatory disease, unspecified: Secondary | ICD-10-CM

## 2017-08-03 NOTE — Progress Notes (Signed)
11/10: 12:00pm.  Patient called requesting saline flushes for her drain [facility].  [facility] 10cc normal saline flushes, Disp 10.  She can flush her drain with 5cc a day.  She understands and is appreciative.  She does know the pharmacy is closed on the weekends and she will need to get this on Monday.  Stephanie [name] E 12:06 PM 08/03/2017

## 2017-08-04 NOTE — Telephone Encounter (Signed)
Patient with concerns about her drain.  Recurrent pelvic abscess.  Has not been flushing it and does not have supplies.  Was expecting home health to come  To follow-up in office Monday.

## 2017-08-07 ENCOUNTER — Encounter (HOSPITAL_COMMUNITY): Payer: Self-pay | Admitting: *Deleted

## 2017-08-07 ENCOUNTER — Other Ambulatory Visit: Payer: Self-pay

## 2017-08-07 MED FILL — MONOJECT 0.9% NA CL SYRING: 0.9 | 10 days supply | Qty: 100 | Fill #0

## 2017-08-08 ENCOUNTER — Ambulatory Visit: Payer: Self-pay | Admitting: Surgery

## 2017-08-13 ENCOUNTER — Other Ambulatory Visit: Payer: Self-pay | Admitting: Surgery

## 2017-08-13 DIAGNOSIS — K611 Rectal abscess: Secondary | ICD-10-CM

## 2017-08-14 ENCOUNTER — Other Ambulatory Visit: Payer: Medicaid Other

## 2017-08-19 ENCOUNTER — Ambulatory Visit (HOSPITAL_COMMUNITY): Payer: Medicaid Other | Admitting: Certified Registered Nurse Anesthetist

## 2017-08-19 ENCOUNTER — Encounter (HOSPITAL_COMMUNITY): Payer: Self-pay

## 2017-08-19 ENCOUNTER — Ambulatory Visit
Admission: RE | Admit: 2017-08-19 | Discharge: 2017-08-19 | Disposition: A | Discharge status: Home / Self Care | Payer: Medicaid Other | Source: Ambulatory Visit | Attending: Surgery | Admitting: Surgery

## 2017-08-19 ENCOUNTER — Other Ambulatory Visit: Payer: Self-pay

## 2017-08-19 ENCOUNTER — Encounter (HOSPITAL_COMMUNITY)
Admission: RE | Disposition: A | Discharge status: Home / Self Care | Payer: Self-pay | Source: Ambulatory Visit | Attending: Surgery

## 2017-08-19 ENCOUNTER — Ambulatory Visit (HOSPITAL_BASED_OUTPATIENT_CLINIC_OR_DEPARTMENT_OTHER)
Admission: RE | Admit: 2017-08-19 | Discharge: 2017-08-19 | Disposition: A | Discharge status: Home / Self Care | Payer: Medicaid Other | Source: Ambulatory Visit | Attending: Surgery | Admitting: Surgery

## 2017-08-19 DIAGNOSIS — K611 Rectal abscess: Secondary | ICD-10-CM

## 2017-08-19 DIAGNOSIS — Z1211 Encounter for screening for malignant neoplasm of colon: Secondary | ICD-10-CM | POA: Insufficient documentation

## 2017-08-19 DIAGNOSIS — Z538 Procedure and treatment not carried out for other reasons: Secondary | ICD-10-CM | POA: Insufficient documentation

## 2017-08-19 DIAGNOSIS — Z79899 Other long term (current) drug therapy: Secondary | ICD-10-CM | POA: Diagnosis not present

## 2017-08-19 DIAGNOSIS — I10 Essential (primary) hypertension: Secondary | ICD-10-CM | POA: Insufficient documentation

## 2017-08-19 DIAGNOSIS — Z7982 Long term (current) use of aspirin: Secondary | ICD-10-CM | POA: Insufficient documentation

## 2017-08-19 HISTORY — DX: Headache, unspecified: R51.9

## 2017-08-19 HISTORY — PX: COLONOSCOPY WITH PROPOFOL: SHX5780

## 2017-08-19 HISTORY — DX: Essential (primary) hypertension: I10

## 2017-08-19 HISTORY — DX: Headache: R51

## 2017-08-19 SURGERY — COLONOSCOPY WITH PROPOFOL
Anesthesia: Monitor Anesthesia Care

## 2017-08-19 MED ORDER — GADOBENATE DIMEGLUMINE 529 MG/ML IV SOLN: 16.0000 mL | Freq: Once | INTRAVENOUS | Status: DC | PRN

## 2017-08-19 MED ORDER — LIDOCAINE 2% (20 MG/ML) 5 ML SYRINGE
INTRAMUSCULAR | Status: AC
  Filled 2017-08-19: qty 5

## 2017-08-19 MED ORDER — PROPOFOL 10 MG/ML IV BOLUS
INTRAVENOUS | Status: AC
  Filled 2017-08-19: qty 60

## 2017-08-19 MED ORDER — SODIUM CHLORIDE 0.9 % IV SOLN: INTRAVENOUS | Status: DC

## 2017-08-19 MED ORDER — PROPOFOL 10 MG/ML IV BOLUS
INTRAVENOUS | Status: DC | PRN
  Administered 2017-08-19: 10 mg via INTRAVENOUS
  Administered 2017-08-19: 20 mg via INTRAVENOUS
  Administered 2017-08-19 (×4): 30 mg via INTRAVENOUS

## 2017-08-19 MED ORDER — ONDANSETRON HCL 4 MG/2ML IJ SOLN
INTRAMUSCULAR | Status: DC | PRN
  Administered 2017-08-19: 4 mg via INTRAVENOUS

## 2017-08-19 MED ORDER — ONDANSETRON HCL 4 MG/2ML IJ SOLN
INTRAMUSCULAR | Status: AC
  Filled 2017-08-19: qty 2

## 2017-08-19 MED ORDER — LACTATED RINGERS IV SOLN
INTRAVENOUS | Status: DC | PRN
  Administered 2017-08-19: 14:00:00 via INTRAVENOUS

## 2017-08-19 MED ORDER — PROPOFOL 500 MG/50ML IV EMUL
INTRAVENOUS | Status: DC | PRN
  Administered 2017-08-19: 75 ug/kg/min via INTRAVENOUS

## 2017-08-19 MED ORDER — LIDOCAINE 2% (20 MG/ML) 5 ML SYRINGE
INTRAMUSCULAR | Status: DC | PRN
  Administered 2017-08-19: 100 mg via INTRAVENOUS

## 2017-08-19 NOTE — Anesthesia Preprocedure Evaluation (Signed)
Anesthesia Evaluation  Patient identified by MRN, date of birth, ID band Patient awake    Reviewed: Allergy & Precautions, NPO status , Patient's Chart, lab work & pertinent test results  Airway Mallampati: II  TM Distance: >3 FB Neck ROM: Full    Dental  (+) Teeth Intact   Pulmonary Current Smoker,    breath sounds clear to auscultation       Cardiovascular hypertension, negative cardio ROS   Rhythm:Regular Rate:Normal     Neuro/Psych PSYCHIATRIC DISORDERS Anxiety Depression Schizophrenia    GI/Hepatic   Endo/Other  diabetes  Renal/GU   negative genitourinary   Musculoskeletal negative musculoskeletal ROS (+)   Abdominal   Peds negative pediatric ROS (+)  Hematology negative hematology ROS (+)   Anesthesia Other Findings   Reproductive/Obstetrics negative OB ROS                             Lab Results  Component Value Date   WBC 5.8 07/26/2017   HGB 12.5 07/26/2017   HCT 38.8 07/26/2017   MCV 103.5 (H) 07/26/2017   PLT 160 07/26/2017   Lab Results  Component Value Date   CREATININE 0.59 07/26/2017   BUN 14 07/26/2017   NA 141 07/26/2017   K 3.7 07/26/2017   CL 109 07/26/2017   CO2 24 07/26/2017   Lab Results  Component Value Date   INR 0.85 07/26/2017   INR 1.05 03/22/2017     Anesthesia Physical  Anesthesia Plan  ASA: II  Anesthesia Plan: MAC   Post-op Pain Management:    Induction: Intravenous  PONV Risk Score and Plan: 1 and Treatment may vary due to age or medical condition  Airway Management Planned: Natural Airway and Simple Face Mask  Additional Equipment:   Intra-op Plan:   Post-operative Plan:   Informed Consent: I have reviewed the patients History and Physical, chart, labs and discussed the procedure including the risks, benefits and alternatives for the proposed anesthesia with the patient or authorized representative who has indicated  his/her understanding and acceptance.   Dental advisory given  Plan Discussed with: CRNA  Anesthesia Plan Comments:         Anesthesia Quick Evaluation

## 2017-08-19 NOTE — Op Note (Addendum)
[facility] Patient Name: Stephanie [name] Procedure Date: 08/19/2017 MRN: [id number] Attending MD: Andria Meusehristopher M Pavle Wiler MD, MD Date of Birth: 1981/07/31 CSN: [id number] Age: [age] Admit Type: Outpatient Procedure:                Colonoscopy Indications:               Providers:                Stephanie Couphristopher M. Tasfia Vasseur MD, MD, Weston SettleBethany Thacker, RN,                            Harrington ChallengerHope Parker, Technician Referring MD:              Medicines:                 Complications:            No immediate complications. Estimated blood loss:                            None. Estimated Blood Loss:     Estimated blood loss: none. Procedure:                Pre-Anesthesia Assessment:                           - Prior to the procedure, a History and Physical                            was performed, and patient medications and                            allergies were reviewed. The patient's tolerance of                            previous anesthesia was also reviewed. The risks                            and benefits of the procedure and the sedation                            options and risks were discussed with the patient.                            All questions were answered, and informed consent                            was obtained. Prior Anticoagulants: The patient has                            taken no previous anticoagulant or antiplatelet                            agents. ASA Grade Assessment: II - A patient with                            mild systemic disease. After reviewing the  risks                            and benefits, the patient was deemed in                            satisfactory condition to undergo the procedure.                           After obtaining informed consent, the colonoscope                            was passed under direct vision. Throughout the                            procedure, the patient's blood pressure, pulse, and                             oxygen saturations were monitored continuously. The                            EC-[id number] ([id number]) scope was introduced through                            the anus with the intention of advancing to the                            cecum. The scope was advanced to the sigmoid colon                            before the procedure was aborted. Medications were                            given. The colonoscopy was aborted due to                            inadequate bowel prep. The patient tolerated the                            procedure well. The quality of the bowel                            preparation was inadequate. The rectum was                            photographed. Scope In: Scope Out: Findings:      The perianal and digital rectal examinations were normal.      A moderate amount of stool was found in the sigmoid colon, precluding       visualization. Impression:               - The procedure was aborted due to inadequate bowel                            prep.                           -  Preparation of the colon was inadequate.                           - Stool in the sigmoid colon.                           - No specimens collected. Moderate Sedation:      N/A- Per Anesthesia Care Recommendation:           - Discharge patient to home.                           - Repeat colonoscopy at appointment to be scheduled                            because the bowel preparation was poor.                           - Patient has a contact number available for                            emergencies. The signs and symptoms of potential                            delayed complications were discussed with the                            patient. Return to normal activities tomorrow.                            Written discharge instructions were provided to the                            patient.                           - Resume previous diet. Procedure Code(s):        --- Professional  ---                           [id number], 53, Colonoscopy, flexible; diagnostic,                            including collection of specimen(s) by brushing or                            washing, when performed (separate procedure) Diagnosis Code(s):        --- Professional ---                           Z53.8, Procedure and treatment not carried out for                            other reasons CPT copyright 2016 [employer]. All rights reserved. The codes documented in this report are preliminary and upon coder review may  be revised to meet  current compliance requirements. Marin Olp, MD Andria Meuse MD, MD 08/19/2017 3:12:19 PM This report has been signed electronically. Number of Addenda: 0

## 2017-08-19 NOTE — Progress Notes (Signed)
Colonoscopy aborted due to inadequate bowel prep per Dr Landry DykeWhite/brt, rn

## 2017-08-19 NOTE — Anesthesia Postprocedure Evaluation (Signed)
Anesthesia Post Note  Patient: Stephanie [name]  Procedure(s) Performed: CANCELLED PROCEDURE     Patient location during evaluation: Endoscopy Anesthesia Type: MAC Level of consciousness: awake and alert Pain management: pain level controlled Vital Signs Assessment: post-procedure vital signs reviewed and stable Respiratory status: spontaneous breathing, nonlabored ventilation, respiratory function stable and patient connected to nasal cannula oxygen Cardiovascular status: stable and blood pressure returned to baseline Postop Assessment: no apparent nausea or vomiting Anesthetic complications: no    Last Vitals:  Vitals:   08/19/17 1520 08/19/17 1527  BP:  (!) 145/89  Pulse: 82 71  Resp: 11 17  Temp:    SpO2: 100% 100%    Last Pain:  Vitals:   08/19/17 1513  TempSrc: Oral                 Anjolaoluwa Siguenza,JAMES TERRILL

## 2017-08-19 NOTE — Transfer of Care (Signed)
Immediate Anesthesia Transfer of Care Note  Patient: Stephanie [name]  Procedure(s) Performed: CANCELLED PROCEDURE  Patient Location: Endoscopy Unit  Anesthesia Type:MAC  Level of Consciousness: awake and patient cooperative  Airway & Oxygen Therapy: Patient Spontanous Breathing and Patient connected to face mask  Post-op Assessment: Report given to RN and Post -op Vital signs reviewed and stable  Post vital signs: Reviewed and stable  Last Vitals:  Vitals:   08/19/17 1315  BP: (!) 141/85  Pulse: 81  Resp: 14  Temp: 37 C  SpO2: 100%    Last Pain:  Vitals:   08/19/17 1315  TempSrc: Oral         Complications: No apparent anesthesia complications

## 2017-08-19 NOTE — H&P (Signed)
CC: Recurrent perirectal abscesses  HPI: Ms. Eliezer ChampagneBrittian is a very pleasant 36yo female here today at the request of my partner Dr. Abbey Chattersosenbower. 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 Gerrit FriendsGerkin 05/29/15 with packing. A residual collection was seen in the pelvis 2 days later and subsequently aspirated by interventional radiology 05/31/15 with drain placement- on studying the drain no fistulous connection was seen to the rectum and the drain is removed.  This recurred 03/21/17 and she underwent percutaneous drainage as this was a collection in her pelvis.  The follow-up drain study 04/02/17 did not demonstrate any fistulous connections to the GI tract and the drain is removed.  This subsequently recurred and she had a percutaneous drainage 07/26/17 and is here today for follow-up.  The drain has been in place and she's been flushing it although has a flush the last 2 days that she doesn't have a home health.  She denies any fever or chills or significant pelvic pain as she had the past.  She believes the drain is controlling her symptoms at this time. She has never undergone colonoscopy  PMH: Hypertension well controlled on oral antihypertensive.  PSH: 1. 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 Perc drain pevlic collection 05/31/15 3. Perc drain pelvic collection 03/21/17 4.Perc drain pelvic collection 07/26/17    Social: Denies use of tobacco/EtOH/drugs  FHx: Denies FHx of malignancy  ROS: A comprehensive 10 system review of systems was completed and pertinent findings noted above.  Allergies No Known Drug Allergies  06/23/2015 Allergies Reconciled    Medication History TraMADol HCl  (50MG  Tablet, 1 (one) Tablet Oral every four-six hours, as needed for pain, Taken starting 07/26/2017) Active. Bactrim DS  (800-160MG  Tablet, 1 (one) Tablet Oral two times daily, Taken starting 07/29/2017) Active. Hydrocodone-Acetaminophen  (7.5-325MG  Tablet, Oral)  Active. Furosemide  (20MG  Tablet, Oral daily) Active. Aspirin-Acetaminophen-Caffeine  (260-130-16MG  Powder, Oral prn) Active. Tylenol  (500MG  Capsule, Oral prn) Active. GlycoLax  (Oral prn) Active. Lidocaine HCl  (2% Gel, External as needed) Active. Medications Reconciled   Review of Systems General Not Present- Appetite Loss, Chills, Fatigue, Fever, Night Sweats, Weight Gain and Weight Loss. Skin Not Present- Change in Wart/Mole, Dryness, Hives, Jaundice, New Lesions, Non-Healing Wounds, Rash and Ulcer. HEENT Not Present- Earache, Hearing Loss, Hoarseness, Nose Bleed, Oral Ulcers, Ringing in the Ears, Seasonal Allergies, Sinus Pain, Sore Throat, Visual Disturbances, Wears glasses/contact lenses and Yellow Eyes. Respiratory Not Present- Bloody sputum, Chronic Cough, Difficulty Breathing, Snoring and Wheezing. Breast Not Present- Breast Mass, Breast Pain, Nipple Discharge and Skin Changes. Cardiovascular Not Present- Chest Pain, Difficulty Breathing Lying Down, Leg Cramps, Palpitations, Rapid Heart Rate, Shortness of Breath and Swelling of Extremities. Gastrointestinal Present- Hemorrhoids and Rectal Pain. Not Present- Abdominal Pain, Bloating, Bloody Stool, Change in Bowel Habits, Chronic diarrhea, Constipation, Difficulty Swallowing, Excessive gas, Gets full quickly at meals, Indigestion, Nausea and Vomiting. Female Genitourinary Not Present- Frequency, Nocturia, Painful Urination, Pelvic Pain and Urgency. Musculoskeletal Not Present- Back Pain, Joint Pain, Joint Stiffness, Muscle Pain, Muscle Weakness and Swelling of Extremities. Neurological Present- Headaches. Not Present- Decreased Memory, Fainting, Numbness, Seizures, Tingling, Tremor, Trouble walking and Weakness. Psychiatric Present- Anxiety, Bipolar, Depression and Frequent crying. Not Present- Change in Sleep Pattern and Fearful. Endocrine Not Present- Cold Intolerance, Excessive Hunger, Hair Changes, Heat Intolerance, Hot flashes and  New Diabetes. Hematology Not Present- Easy Bruising, Excessive bleeding, Gland problems, HIV and Persistent Infections.  Vitals 08/05/2017 3:25 PM Weight: 178.2 lb  Height: 67 in  Body Surface Area: 1.93 m   Body Mass Index: 27.91 kg/m   Temp.: 99.8 F    Pulse: 120 (Regular)    BP: 130/80 (Sitting, Left Arm, Standard)  Physical Exam The physical exam findings are as follows: Note: Constitutional: No acute distress, conversant, no deformities Eyes: Moist conjunctiva; no lid lag; anicteric; pupils equal round and reactive to light Neck: Trachea midline; no palpable thyromegaly Lungs: Normal respiratory effort; no tactile fremitus CV: Regular rate and rhythm; no palpable thrill; no pitting edema GI: Abdomen soft, nontender, nondistended; no palpable hepatosplenomegaly Anorectal: No external lesions, no external openings; no palpable areas of fluctuance. DRE: Some fullness posteriorly. Good tone. Unable to tolerate anoscopy. MSK: Normal gait; no clubbing/cyanosis Psych: Appropriate affect; alert and oriented 3 Lymphatic: No palpable cervical or axillary lymphadenopathy    Assessment & Plan PRESACRAL CYST vs ABSCESS (R19.09) Impression: Very pleasant and nervous 3F being seen for recurrent pelvic fluid collections - I believe this may actually be a retrorectal cyst -Anatomy physiology of the anal rectal region was discussed the patient. The possible etiologies for her recurrent symptoms including a retrorectal cyst versus recurrent presacral abscesses as discussed. We discussed the treatment approach to these rare and unusual lesions. She expressed understanding -Colonoscopy today  Stephanie CoupChristopher M. Cliffton AstersWhite, M.D. General and Colorectal Surgery [facility]

## 2017-08-21 ENCOUNTER — Encounter (HOSPITAL_COMMUNITY): Payer: Self-pay | Admitting: Surgery

## 2017-08-29 ENCOUNTER — Encounter (HOSPITAL_COMMUNITY): Payer: Self-pay | Admitting: *Deleted

## 2017-08-29 ENCOUNTER — Other Ambulatory Visit: Payer: Self-pay

## 2017-09-01 ENCOUNTER — Encounter (HOSPITAL_COMMUNITY): Payer: Self-pay | Admitting: Anesthesiology

## 2017-09-01 NOTE — Anesthesia Preprocedure Evaluation (Deleted)
Anesthesia Evaluation  Patient identified by MRN, date of birth, ID band Patient awake    Reviewed: Allergy & Precautions, NPO status , Patient's Chart, lab work & pertinent test results  Airway Mallampati: II  TM Distance: >3 FB Neck ROM: Full    Dental  (+) Teeth Intact   Pulmonary Current Smoker,    breath sounds clear to auscultation       Cardiovascular hypertension, negative cardio ROS   Rhythm:Regular Rate:Normal     Neuro/Psych PSYCHIATRIC DISORDERS Anxiety Depression Schizophrenia    GI/Hepatic   Endo/Other  diabetes  Renal/GU   negative genitourinary   Musculoskeletal negative musculoskeletal ROS (+)   Abdominal   Peds negative pediatric ROS (+)  Hematology negative hematology ROS (+)   Anesthesia Other Findings   Reproductive/Obstetrics negative OB ROS                             Lab Results  Component Value Date   WBC 5.8 07/26/2017   HGB 12.5 07/26/2017   HCT 38.8 07/26/2017   MCV 103.5 (H) 07/26/2017   PLT 160 07/26/2017   Lab Results  Component Value Date   CREATININE 0.59 07/26/2017   BUN 14 07/26/2017   NA 141 07/26/2017   K 3.7 07/26/2017   CL 109 07/26/2017   CO2 24 07/26/2017   Lab Results  Component Value Date   INR 0.85 07/26/2017   INR 1.05 03/22/2017     Anesthesia Physical  Anesthesia Plan  ASA: II  Anesthesia Plan: MAC   Post-op Pain Management:    Induction: Intravenous  PONV Risk Score and Plan: 1 and Treatment may vary due to age or medical condition  Airway Management Planned: Natural Airway, Simple Face Mask and Nasal Cannula  Additional Equipment:   Intra-op Plan:   Post-operative Plan:   Informed Consent: I have reviewed the patients History and Physical, chart, labs and discussed the procedure including the risks, benefits and alternatives for the proposed anesthesia with the patient or authorized representative who has  indicated his/her understanding and acceptance.   Dental advisory given  Plan Discussed with: CRNA and Anesthesiologist  Anesthesia Plan Comments:         Anesthesia Quick Evaluation

## 2017-09-02 ENCOUNTER — Ambulatory Visit (HOSPITAL_COMMUNITY): Admission: RE | Admit: 2017-09-02 | Payer: Medicaid Other | Source: Ambulatory Visit | Admitting: Surgery

## 2017-09-02 SURGERY — COLONOSCOPY WITH PROPOFOL
Anesthesia: Monitor Anesthesia Care

## 2017-09-25 ENCOUNTER — Other Ambulatory Visit: Payer: Self-pay

## 2017-09-25 ENCOUNTER — Encounter (HOSPITAL_COMMUNITY): Payer: Self-pay | Admitting: *Deleted

## 2017-09-25 ENCOUNTER — Ambulatory Visit (HOSPITAL_COMMUNITY): Payer: Medicaid Other | Admitting: Registered Nurse

## 2017-09-25 ENCOUNTER — Encounter (HOSPITAL_COMMUNITY)
Admission: RE | Disposition: A | Discharge status: Home / Self Care | Payer: Self-pay | Source: Ambulatory Visit | Attending: Surgery

## 2017-09-25 ENCOUNTER — Ambulatory Visit (HOSPITAL_COMMUNITY)
Admission: RE | Admit: 2017-09-25 | Discharge: 2017-09-25 | Disposition: A | Discharge status: Home / Self Care | Payer: Medicaid Other | Source: Ambulatory Visit | Attending: Surgery | Admitting: Surgery

## 2017-09-25 DIAGNOSIS — R933 Abnormal findings on diagnostic imaging of other parts of digestive tract: Secondary | ICD-10-CM | POA: Diagnosis not present

## 2017-09-25 DIAGNOSIS — I10 Essential (primary) hypertension: Secondary | ICD-10-CM | POA: Insufficient documentation

## 2017-09-25 DIAGNOSIS — Z7982 Long term (current) use of aspirin: Secondary | ICD-10-CM | POA: Diagnosis not present

## 2017-09-25 DIAGNOSIS — Z79899 Other long term (current) drug therapy: Secondary | ICD-10-CM | POA: Diagnosis not present

## 2017-09-25 DIAGNOSIS — K6131 Horseshoe abscess: Secondary | ICD-10-CM | POA: Insufficient documentation

## 2017-09-25 HISTORY — PX: FLEXIBLE SIGMOIDOSCOPY: SHX5431

## 2017-09-25 LAB — POCT I-STAT 4, (NA,K, GLUC, HGB,HCT)
Glucose, Bld: 76 mg/dL (ref 65–99)
HCT: 38 % (ref 36.0–46.0)
HEMOGLOBIN: 12.9 g/dL (ref 12.0–15.0)
POTASSIUM: 3.6 mmol/L (ref 3.5–5.1)
SODIUM: 144 mmol/L (ref 135–145)

## 2017-09-25 SURGERY — SIGMOIDOSCOPY, FLEXIBLE
Anesthesia: Monitor Anesthesia Care

## 2017-09-25 MED ORDER — PROPOFOL 10 MG/ML IV BOLUS
INTRAVENOUS | Status: DC | PRN
  Administered 2017-09-25 (×2): 20 mg via INTRAVENOUS
  Administered 2017-09-25: 120 mg via INTRAVENOUS

## 2017-09-25 MED ORDER — PROPOFOL 500 MG/50ML IV EMUL
INTRAVENOUS | Status: DC | PRN
  Administered 2017-09-25: 150 ug/kg/min via INTRAVENOUS

## 2017-09-25 MED ORDER — MIDAZOLAM HCL 2 MG/2ML IJ SOLN: 0.5000 mg | Freq: Once | INTRAMUSCULAR | Status: DC | PRN

## 2017-09-25 MED ORDER — PROMETHAZINE HCL 25 MG/ML IJ SOLN: 6.2500 mg | INTRAMUSCULAR | Status: DC | PRN

## 2017-09-25 MED ORDER — LACTATED RINGERS IV SOLN
INTRAVENOUS | Status: DC
  Administered 2017-09-25: 1000 mL via INTRAVENOUS

## 2017-09-25 MED ORDER — MEPERIDINE HCL 25 MG/ML IJ SOLN: 6.2500 mg | INTRAMUSCULAR | Status: DC | PRN

## 2017-09-25 MED ORDER — PROPOFOL 10 MG/ML IV BOLUS
INTRAVENOUS | Status: AC
  Filled 2017-09-25: qty 60

## 2017-09-25 NOTE — Anesthesia Postprocedure Evaluation (Signed)
Anesthesia Post Note  Patient: Stephanie [name]  Procedure(s) Performed: Mona     Patient location during evaluation: Endoscopy Anesthesia Type: MAC Level of consciousness: awake and alert, patient cooperative and oriented Pain management: pain level controlled Vital Signs Assessment: post-procedure vital signs reviewed and stable Respiratory status: nonlabored ventilation, spontaneous breathing and respiratory function stable Cardiovascular status: stable and blood pressure returned to baseline Postop Assessment: no apparent nausea or vomiting Anesthetic complications: no    Last Vitals:  Vitals:   09/25/17 1449 09/25/17 1450  BP: 135/86 131/67  Pulse: 72 73  Resp: (!) 27 18  Temp: 36.8 C   SpO2: 100% 100%    Last Pain:  Vitals:   09/25/17 1449  TempSrc: Oral                 Kalab Camps,E. Kalim Kissel

## 2017-09-25 NOTE — Transfer of Care (Signed)
Immediate Anesthesia Transfer of Care Note  Patient: Stephanie [name]  Procedure(s) Performed: FLEXIBLE SIGMOIDOSCOPY  Patient Location: PACU  Anesthesia Type:MAC  Level of Consciousness: awake, alert  and oriented  Airway & Oxygen Therapy: Patient Spontanous Breathing and Patient connected to face mask oxygen  Post-op Assessment: Report given to RN and Post -op Vital signs reviewed and stable  Post vital signs: Reviewed and stable  Last Vitals:  Vitals:   09/25/17 1259  BP: (!) 141/85  Pulse: 79  Resp: 20  Temp: 37.8 C  SpO2: 100%    Last Pain:  Vitals:   09/25/17 1259  TempSrc: Oral         Complications: No apparent anesthesia complications

## 2017-09-25 NOTE — Op Note (Signed)
[facility] Patient Name: Stephanie [name] Procedure Date: 09/25/2017 MRN: [id number] Attending MD: Ileana Roup MD, MD Date of Birth: [birth year] CSN: [id number] Age: 37 Admit Type: Outpatient Procedure:                Flexible Sigmoidoscopy Indications:              Abnormal MRI of the GI tract Providers:                Sharon Mt. Valaree Fresquez MD, MD, Elmer Ramp. Tilden Dome, RN,                            William Dalton, Technician Referring MD:              Medicines:                Monitored Anesthesia Care Complications:            No immediate complications. Estimated Blood Loss:     Estimated blood loss: none. Procedure:                Pre-Anesthesia Assessment:                           - ASA Grade Assessment: II - A patient with mild                            systemic disease.                           - After reviewing the risks and benefits, the                            patient was deemed in satisfactory condition to                            undergo the procedure.                           - The anesthesia plan was to use monitored                            anesthesia care (MAC).                           After obtaining informed consent, the scope was                            passed under direct vision. The EC-[id number] ([id number])                            scope was introduced through the anus and advanced                            to the the descending colon. After obtaining                            informed consent, the scope was passed under direct  vision. The flexible sigmoidoscopy was accomplished                            without difficulty. The patient tolerated the                            procedure well. The quality of the bowel                            preparation was inadequate. Scope In: 2:35:11 PM Scope Out: 2:40:52 PM Total Procedure Duration: 0 hours 5 minutes 41 seconds  Findings:      The perianal and  digital rectal examinations were normal.      A large amount of solid stool was found in the proximal sigmoid colon,       precluding visualization.      From the visualized portion of the colon, there were no visible       abnormalities Impression:               - Preparation of the colon was inadequate.                           - No specimens collected. Moderate Sedation:      N/A- Per Anesthesia Care Recommendation:           - Return to my office in 2 weeks. Procedure Code(s):        --- Professional ---                           [phone], Sigmoidoscopy, flexible; diagnostic,                            including collection of specimen(s) by brushing or                            washing, when performed (separate procedure) Diagnosis Code(s):        --- Professional ---                           R93.3, Abnormal findings on diagnostic imaging of                            other parts of digestive tract CPT copyright 2016 American Medical Association. All rights reserved. The codes documented in this report are preliminary and upon coder review may  be revised to meet current compliance requirements. Nadeen Landau, MD Ileana Roup MD, MD 09/25/2017 2:53:32 PM This report has been signed electronically. Number of Addenda: 0

## 2017-09-25 NOTE — H&P (Signed)
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 I have been seeing her at the request of my partner Dr. Abbey Chatters. 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 Gerrit Friends 05/29/15 with packing. A residual collection was seen in the pelvis 2 days later and subsequently aspirated by interventional radiology 05/31/15 with drain placement- on studying the drain no fistulous connection was seen to the rectum and the drain is removed. This recurred 03/21/17 and she underwent percutaneous drainage as this was a collection in her pelvis. The follow-up drain study 04/02/17 did not demonstrate any fistulous connections to the GI tract and the drain is removed. This subsequently recurred and she had a percutaneous drainage 07/26/17 and is here today for follow-up. The drain has been in place and she's been flushing it although has a flush the last 2 days that she doesn't have a home health. She denies any fever or chills or significant pelvic pain as she had the past. She believes the drain is controlling her symptoms at this time. She has never undergone colonoscopy  PMH: Hypertension well controlled on oral antihypertensive.  PSH: 1. 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 Perc drain pevlic collection 05/31/15 3. Perc drain pelvic collection 03/21/17 4.Perc drain pelvic collection 07/26/17   Social: Denies use of tobacco/EtOH/drugs  FHx: Denies FHx of malignancy  ROS: A comprehensive 10 system review of systems was completed and pertinent findings noted above.  Allergies No Known Drug Allergies 06/23/2015 Allergies Reconciled   Medication History TraMADol HCl (50MG  Tablet, 1 (one) Tablet Oral every four-six hours, as needed for pain, Taken starting 07/26/2017) Active. Bactrim DS (800-160MG  Tablet, 1 (one) Tablet Oral two times daily, Taken starting 07/29/2017)  Active. Hydrocodone-Acetaminophen (7.5-325MG  Tablet, Oral) Active. Furosemide (20MG  Tablet, Oral daily) Active. Aspirin-Acetaminophen-Caffeine (260-130-16MG  Powder, Oral prn) Active. Tylenol (500MG  Capsule, Oral prn) Active. GlycoLax (Oral prn) Active. Lidocaine HCl (2% Gel, External as needed) Active. Medications Reconciled  Review of Systems General Not Present- Appetite Loss, Chills, Fatigue, Fever, Night Sweats, Weight Gain and Weight Loss. Skin Not Present- Change in Wart/Mole, Dryness, Hives, Jaundice, New Lesions, Non-Healing Wounds, Rash and Ulcer. HEENT Not Present- Earache, Hearing Loss, Hoarseness, Nose Bleed, Oral Ulcers, Ringing in the Ears, Seasonal Allergies, Sinus Pain, Sore Throat, Visual Disturbances, Wears glasses/contact lenses and Yellow Eyes. Respiratory Not Present- Bloody sputum, Chronic Cough, Difficulty Breathing, Snoring and Wheezing. Breast Not Present- Breast Mass, Breast Pain, Nipple Discharge and Skin Changes. Cardiovascular Not Present- Chest Pain, Difficulty Breathing Lying Down, Leg Cramps, Palpitations, Rapid Heart Rate, Shortness of Breath and Swelling of Extremities. Gastrointestinal Present- Hemorrhoids and Rectal Pain. Not Present- Abdominal Pain, Bloating, Bloody Stool, Change in Bowel Habits, Chronic diarrhea, Constipation, Difficulty Swallowing, Excessive gas, Gets full quickly at meals, Indigestion, Nausea and Vomiting. Female Genitourinary Not Present- Frequency, Nocturia, Painful Urination, Pelvic Pain and Urgency. Musculoskeletal Not Present- Back Pain, Joint Pain, Joint Stiffness, Muscle Pain, Muscle Weakness and Swelling of Extremities. Neurological Present- Headaches. Not Present- Decreased Memory, Fainting, Numbness, Seizures, Tingling, Tremor, Trouble walking and Weakness. Psychiatric Present- Anxiety, Bipolar, Depression and Frequent crying. Not Present- Change in Sleep Pattern and Fearful. Endocrine Not Present- Cold Intolerance,  Excessive Hunger, Hair Changes, Heat Intolerance, Hot flashes and New Diabetes. Hematology Not Present- Easy Bruising, Excessive bleeding, Gland problems, HIV and Persistent Infections.  Vitals 08/05/2017 3:25 PM Weight: 178.2 lb Height: 67in Body Surface Area: 1.93 m Body Mass Index:  27.91 kg/m  Temp.: 99.12F  Pulse: 120 (Regular)  BP: 130/80 (Sitting, Left Arm, Standard)  Physical Exam The physical exam findings are as follows: Note:Constitutional: No acute distress, conversant, no deformities Eyes: Moist conjunctiva; no lid lag; anicteric; pupils equal round and reactive to light Neck: Trachea midline; no palpable thyromegaly Lungs: Normal respiratory effort; no tactile fremitus CV: Regular rate and rhythm; no palpable thrill; no pitting edema GI: Abdomen soft, nontender, nondistended; no palpable hepatosplenomegaly Anorectal: No external lesions, no external openings; no palpable areas of fluctuance. DRE: Some fullness posteriorly. Good tone. Unable to tolerate anoscopy. 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 The possible etiologies for her recurrent symptoms including a retrorectal cyst versus recurrent presacral abscesses as discussed. We discussed the treatment approach to these rare and unusual lesions. She expressed understanding -Colonoscopy today -The planned procedure, material risks (including but not limited to pain, bleeding, need for blood transfusion, perforation of colon, need for additional procedures) benefits and alternatives were discussed. Her questions were answered  and she elected to proceed  Stephanie CoupChristopher M. Cliffton AstersWhite, M.D. General and Colorectal Surgery [facility]

## 2017-09-25 NOTE — Anesthesia Preprocedure Evaluation (Signed)
Anesthesia Evaluation  Patient identified by MRN, date of birth, ID band Patient awake    Reviewed: Allergy & Precautions, NPO status , Patient's Chart, lab work & pertinent test results  History of Anesthesia Complications Negative for: history of anesthetic complications  Airway Mallampati: I  TM Distance: >3 FB Neck ROM: Full    Dental  (+) Dental Advisory Given   Pulmonary Current Smoker,    breath sounds clear to auscultation       Cardiovascular hypertension, Pt. on medications (-) angina Rhythm:Regular Rate:Normal     Neuro/Psych  Headaches, Anxiety Depression Schizophrenia    GI/Hepatic negative GI ROS, Neg liver ROS,   Endo/Other  negative endocrine ROS  Renal/GU negative Renal ROS     Musculoskeletal   Abdominal   Peds  Hematology negative hematology ROS (+)   Anesthesia Other Findings   Reproductive/Obstetrics                             Anesthesia Physical Anesthesia Plan  ASA: II  Anesthesia Plan: MAC   Post-op Pain Management:    Induction:   PONV Risk Score and Plan: 1 and Treatment may vary due to age or medical condition  Airway Management Planned: Natural Airway and Nasal Cannula  Additional Equipment:   Intra-op Plan:   Post-operative Plan:   Informed Consent: I have reviewed the patients History and Physical, chart, labs and discussed the procedure including the risks, benefits and alternatives for the proposed anesthesia with the patient or authorized representative who has indicated his/her understanding and acceptance.   Dental advisory given  Plan Discussed with: CRNA and Surgeon  Anesthesia Plan Comments: (Plan routine monitors, MAC)        Anesthesia Quick Evaluation

## 2017-09-27 ENCOUNTER — Encounter (HOSPITAL_COMMUNITY): Payer: Self-pay | Admitting: Surgery

## 2017-10-09 ENCOUNTER — Telehealth: Payer: Self-pay | Admitting: *Deleted

## 2017-10-09 NOTE — Telephone Encounter (Signed)
Pt called regarding refill for lidocaine gel prescribed by surgeon.  EDCM advised to call/have pharmacy call surgeon office to request refill.  Pt states she will call promptly.  No further EDCM needs identified.

## 2017-10-25 ENCOUNTER — Encounter: Payer: Self-pay | Admitting: Psychology

## 2017-10-25 ENCOUNTER — Ambulatory Visit (INDEPENDENT_AMBULATORY_CARE_PROVIDER_SITE_OTHER): Payer: Medicaid Other | Admitting: Family Medicine

## 2017-10-25 ENCOUNTER — Encounter: Payer: Self-pay | Admitting: Family Medicine

## 2017-10-25 VITALS — BP 110/70 | HR 73 | Temp 98.3°F | Ht 67.0 in | Wt 182.2 lb

## 2017-10-25 DIAGNOSIS — F329 Major depressive disorder, single episode, unspecified: Secondary | ICD-10-CM

## 2017-10-25 DIAGNOSIS — L74512 Primary focal hyperhidrosis, palms: Secondary | ICD-10-CM | POA: Insufficient documentation

## 2017-10-25 DIAGNOSIS — F32A Depression, unspecified: Secondary | ICD-10-CM | POA: Insufficient documentation

## 2017-10-25 DIAGNOSIS — Z7689 Persons encountering health services in other specified circumstances: Secondary | ICD-10-CM | POA: Diagnosis not present

## 2017-10-25 NOTE — Assessment & Plan Note (Signed)
Patient notes sweaty palms and says she is constantly sweaty, even in the cold.  We discussed plan to order TSH in house.

## 2017-10-25 NOTE — Assessment & Plan Note (Signed)
Main concern is her chronic perirectal issues she is following with a local colorectal surgeon for.   She is unsatisfied with the fact that is seems persistent but has been resistant to the [facility] referals made by the surgeon over transportation issues.   We discussed that it might be healthiest to try and make arrangements to see [facility] as it isn't resolving here and that is the recommendation of the expert she is seeing.  We have no reason in this office to contradict that advice at this time.

## 2017-10-25 NOTE — Progress Notes (Signed)
    Subjective:  Stephanie [name] is a 37 y.o. female who [facility] with a chief complaint of establishing care.   HPI: We reviewed her medication which was as documented in the records, notably she has self-discontinued zoloft for concerns that it makes her cry more than she does without it.   She specifically denies SI/HI but generally feels overwhelmed with everything going in life as she tries to care for family, tend her health and manage poor finances.  She is agreeable to speaking to our behavioral health team.  She also has a concern about her chronic perirectal issues she is following with a local colorectal surgeon for.   She has had an I/D with multiple drains.  She is unsatisfied with the fact that is seems persistent but has been resistant to the [facility] referals made by the surgeon over transportation issues.   We discussed that it might be healthiest to try and make arrangements to see [facility] as it isn't resolving here and that is the recommendation of the expert she is seeing.  We have no reason in this office to contradict that advice at this time.  She states she strains to go to the bathroom but is not currently bleeding and hasn't since procedure.   She still has mild abdominal pain but it is improved since her drain.   Objective:  Physical Exam: BP 110/70 (BP Location: Left Arm, Patient Position: Sitting, Cuff Size: Normal)   Pulse 73   Temp 98.3 F (36.8 C) (Oral)   Ht 5\' 7"  (1.702 m)   Wt 182 lb 3.2 oz (82.6 kg)   SpO2 99%   BMI 28.54 kg/m   Gen: anxious, physically comfortable in the room but gets agitated quickly when discussing health frustrations CV: RRR with no murmurs appreciated Pulm: NWOB, CTAB with no crackles, wheezes, or rhonchi GI: Normal bowel sounds present. Soft, Nontender, Nondistended.  No pain to palpation. MSK: no edema, cyanosis, or clubbing noted Skin: warm, dry Neuro: grossly normal, moves all extremities Psych: anxious, did not cry  with me but became quite tearful with behavioral health and CNA  No results found for this or any previous visit (from the past 72 hour(s)).   Assessment/Plan:  Depression No SI/HI, cries a lot but cried more on zoloft so she stopped taking it on her own.   She says she has a lot of stress in her life due to kids/money/healthand has been self-medicating with tobacco/marijuana.  She agrees to see our behavioral health team  Encounter to establish care Main concern is her chronic perirectal issues she is following with a local colorectal surgeon for.   She is unsatisfied with the fact that is seems persistent but has been resistant to the [facility] referals made by the surgeon over transportation issues.   We discussed that it might be healthiest to try and make arrangements to see [facility] as it isn't resolving here and that is the recommendation of the expert she is seeing.  We have no reason in this office to contradict that advice at this time.  Sweaty palms Patient notes sweaty palms and says she is constantly sweaty, even in the cold.  We discussed plan to order TSH in house.    Marthenia RollingScott Aldona Bryner, DO FAMILY MEDICINE RESIDENT - PGY1 10/25/2017 1:48 PM

## 2017-10-25 NOTE — Assessment & Plan Note (Signed)
Assessment/Plan Recommendations:  Patient presented with depressive symptoms and was highly tearful discussing several life stressors including parenting children with health needs and financial difficulties, both of which are likely contributing to her symptomology. Per Dr. Parke SimmersBland, patient denied SI/HI. [facility] appeared [facility] inquired how she is coping and taking care of herself because much of her distress is related to her children and their problems. [facility] engaged in reflective listening and discussed options for therapy referrals or [facility]. [facility] in a couple of weeks.

## 2017-10-25 NOTE — Assessment & Plan Note (Addendum)
No SI/HI, cries a lot but cried more on zoloft so she stopped taking it on her own.   She says she has a lot of stress in her life due to kids/money/healthand has been self-medicating with tobacco/marijuana.  She agrees to see our behavioral health team

## 2017-10-25 NOTE — Progress Notes (Signed)
Dr. Criss Rosales requested a [place] briefly met with patient between scheduled follow-ups to introduce self and make plant for next steps.  Presenting Issue: Depressive symptoms and anxiety. Patient reported she has previously been diagnosed with PTSD, anxiety and schizoaffective disorder, but these dx are not in her problem list. Patient reported she was most distressed due to challenges of being a single parent to children who have mental health needs and due to experiencing financial difficulties.   Duration of CURRENT symptoms: Years  Psychiatric History - Diagnoses: Depression - Pharmacotherapy: Per Dr. Criss Rosales, took Zoflot for years and recently stopped taking medication and was interested in therapy services  - Outpatient therapy: yes, received therapy services for entire family, which ended on 09/23/17 and is now in the process of trying to obtain intensive in-home services for her family   Per Dr. Criss Rosales, denied any current SI or HI.   Warmhandoff:  complete

## 2017-10-25 NOTE — Patient Instructions (Signed)
It was a pleasure to see you today! Thank you for choosing [facility] for your primary care. Stephanie [name] was seen for establishing care. Come back to the clinic if you have any new/worse symptoms, and go to the emergency room if you have any life threatening concerns.  Today we reviewed your meds and talked about your most concern health problems (colon and depression).  We agreed it would be best for you to go see [facility] as recommended by your colorectal surgeon and to discuss a potential replacement med for zoloft with our behavioral health team since you are no longer on medication for depression and you didn't like zoloft.   We are also ordering a TSH due to your complaint of sweaty palms and always being hot.   If we did any lab work today, and the results require attention, either me or my nurse will get in touch with you. If everything is normal, you will get a letter in mail and a message via . If you don't hear from us in two weeks, please give us a call. Otherwise, we look forward to seeing you again at your next visit. If you have any questions or concerns before then, please call the clinic at [phone].  Please bring all your medications to every doctors visit  Sign up for My Chart to have easy access to your labs results, and communication with your Primary care physician.    Please check-out at the front desk before leaving the clinic.    Best,  Dr. Marthenia RollingScott Vernica Wachtel FAMILY MEDICINE RESIDENT - PGY1 10/25/2017 10:27 AM

## 2017-10-26 LAB — TSH: TSH: 0.29 u[IU]/mL — AB (ref 0.450–4.500)

## 2017-10-28 ENCOUNTER — Other Ambulatory Visit: Payer: Self-pay | Admitting: Family Medicine

## 2017-10-28 DIAGNOSIS — E038 Other specified hypothyroidism: Secondary | ICD-10-CM

## 2017-10-28 NOTE — Progress Notes (Signed)
Patient complaining of sweating palms and feeling of always being warm.   We drew a TSH which came back deficient and are following up with T3/T4 as a lab draw before determining more appropriate course of action.  Patient called and told she should come in for lab draw and does not need a full doctor's appt for the draw.  Marthenia Rolling-Sylvanus Telford, DO

## 2017-10-30 ENCOUNTER — Telehealth: Payer: Self-pay | Admitting: Licensed Clinical Social Worker

## 2017-10-30 NOTE — Progress Notes (Signed)
Type of Service: Clinical Social Work  Social work consult from [employer] reference  Patient needing assistance with transportation to appointment at [employer].   LCSW called patient to assess the need.   Patient was very talkative and over talked LCSW.  Patient states she does not have an appointment at [facility] and is waiting to be referred.   The following was discussed; frustration with PCP at [facility], the [facility] she met with at the office who did not address her mental health concerns; .concerns that no one is listening to her,  Frustration with chronic pain; stressors with her children; concerns with her mental health, and frustration with navigating the health system. Patient also become frustrated with LCSW.  LCSW explores community options, discussed what mental health treatment has worked in the past and previous providers.  LCSW called referral department at Dr. Tawanna Cooler [phone] left message with Maudie Mercury. (waiting return call)  Intervention: emotional support, Reflective listening, solution focused intervention and. Referral to [facility] provider   ASSESSMENT:Patient currently experiencing frustration with chronic pain, overwhelmed with trying to navigate next steps for a referral to [facility] .  Frustration exacerbated by mental health concerns an symptoms of depression. Patient may benefit from, and is in agreement to receive further assessment and therapeutic interventions to assist with managing her symptoms. Patient would also like LCSW to assist with next steps.  She needs to get appointment set up then focus on transportation to [facility]. By the end of a lengthy conversation LCSW was able to connect with patient and she was appreciative of the call.   Plan: 1. Patient agrees to contact her previous Mental Health provided at [employer]   2. LCSW will contact provider to find out where they are in the referral process 3  LCSW will call patient in 3 to 5 days.  Casimer Lanius, LCSW Licensed  Clinical Social Worker [place]   [phone] 12:22 PM

## 2017-10-31 NOTE — Progress Notes (Addendum)
Type of Service: Clinical Social Work  LCSW received return call from TensedKimberly with [facility] and from patient.   Cala BradfordKimberly has spoken with Dr. Cliffton AstersWhite. It was decided that it is not in patient's best interest to be [facility].  This referral would have been to establish care with internal medicine or PCP, not for a specialist.  Going to [facility] would also require patient to change her Medicaid.  Based on the conversation patient had with Dr. Lucilla LameWhite's office she will not peruse [facility] and will continue care with Dr. Parke SimmersBland, [facility] is no longer an option.   Patient has expressed concerns to Dr. Cliffton AstersWhite via phone that she has concerns with pain which she believes is nerve pain. Update provided to PCP via in-basket.  Patient is pleased and in agreement with the plan below Plan: 1. Cala BradfordKimberly with [facility] will make a referral for patient to see ortho or neuro. to address her pain.  2. Patient will continue to F/U with Jovita KussmaulEvans Blount to [facility] 3. Patient has an appointment with Lilia ProEvan Blount to re-establish her mental health treatment.  4. Patient will reach out to LCSW if assistance is needed for coordination of care and services.  Sammuel Hineseborah Gabrielle Mester, LCSW Licensed Clinical Social Worker [facility]   [phone] 10:12 AM

## 2017-11-04 ENCOUNTER — Telehealth: Payer: Self-pay | Admitting: Family Medicine

## 2017-11-04 NOTE — Telephone Encounter (Signed)
Called patient, phone line was busy with no option to leave message.

## 2017-11-04 NOTE — Telephone Encounter (Signed)
Patient called about her situation she has discussed with Parke SimmersBland and Gavin Poundeborah. She said she tried calling Gavin PoundDeborah but didn't get an answer. She would like either Bland or Gavin PoundDeborah to call her back to discuss this. Please advise

## 2017-11-05 NOTE — Telephone Encounter (Signed)
[facility] called [facility] after phone discussion with Ms. Moore, CSW and was told [facility] is [facility] as surgeon does not believe pain to be likely of ortho/neuro etiology.   I will call patient to clarify with them.

## 2018-02-19 ENCOUNTER — Encounter (HOSPITAL_COMMUNITY): Payer: Self-pay

## 2018-02-19 ENCOUNTER — Inpatient Hospital Stay (HOSPITAL_COMMUNITY)
Admission: EM | Admit: 2018-02-19 | Discharge: 2018-02-28 | DRG: 349 | Disposition: A | Discharge status: Home / Self Care | Payer: Medicaid Other | Attending: Surgery | Admitting: Surgery

## 2018-02-19 ENCOUNTER — Emergency Department (HOSPITAL_COMMUNITY): Payer: Medicaid Other

## 2018-02-19 ENCOUNTER — Other Ambulatory Visit: Payer: Self-pay

## 2018-02-19 ENCOUNTER — Emergency Department (HOSPITAL_COMMUNITY)
Admission: EM | Admit: 2018-02-19 | Discharge: 2018-02-19 | Discharge status: Absent without leave | Payer: Medicaid Other

## 2018-02-19 DIAGNOSIS — R1900 Intra-abdominal and pelvic swelling, mass and lump, unspecified site: Secondary | ICD-10-CM

## 2018-02-19 DIAGNOSIS — K648 Other hemorrhoids: Secondary | ICD-10-CM | POA: Diagnosis present

## 2018-02-19 DIAGNOSIS — R102 Pelvic and perineal pain: Secondary | ICD-10-CM

## 2018-02-19 DIAGNOSIS — F32A Depression, unspecified: Secondary | ICD-10-CM | POA: Diagnosis present

## 2018-02-19 DIAGNOSIS — I10 Essential (primary) hypertension: Secondary | ICD-10-CM | POA: Diagnosis present

## 2018-02-19 DIAGNOSIS — K611 Rectal abscess: Secondary | ICD-10-CM | POA: Diagnosis present

## 2018-02-19 DIAGNOSIS — F410 Panic disorder [episodic paroxysmal anxiety] without agoraphobia: Secondary | ICD-10-CM | POA: Diagnosis present

## 2018-02-19 DIAGNOSIS — Z79899 Other long term (current) drug therapy: Secondary | ICD-10-CM

## 2018-02-19 DIAGNOSIS — F17210 Nicotine dependence, cigarettes, uncomplicated: Secondary | ICD-10-CM | POA: Diagnosis present

## 2018-02-19 DIAGNOSIS — N739 Female pelvic inflammatory disease, unspecified: Secondary | ICD-10-CM | POA: Diagnosis present

## 2018-02-19 DIAGNOSIS — R1909 Other intra-abdominal and pelvic swelling, mass and lump: Principal | ICD-10-CM | POA: Diagnosis present

## 2018-02-19 DIAGNOSIS — Z8249 Family history of ischemic heart disease and other diseases of the circulatory system: Secondary | ICD-10-CM

## 2018-02-19 DIAGNOSIS — K642 Third degree hemorrhoids: Secondary | ICD-10-CM | POA: Insufficient documentation

## 2018-02-19 DIAGNOSIS — K644 Residual hemorrhoidal skin tags: Secondary | ICD-10-CM

## 2018-02-19 DIAGNOSIS — E876 Hypokalemia: Secondary | ICD-10-CM

## 2018-02-19 DIAGNOSIS — F329 Major depressive disorder, single episode, unspecified: Secondary | ICD-10-CM | POA: Diagnosis present

## 2018-02-19 DIAGNOSIS — F4310 Post-traumatic stress disorder, unspecified: Secondary | ICD-10-CM | POA: Diagnosis present

## 2018-02-19 DIAGNOSIS — F1290 Cannabis use, unspecified, uncomplicated: Secondary | ICD-10-CM | POA: Diagnosis present

## 2018-02-19 DIAGNOSIS — K5909 Other constipation: Secondary | ICD-10-CM

## 2018-02-19 DIAGNOSIS — K5900 Constipation, unspecified: Secondary | ICD-10-CM | POA: Diagnosis present

## 2018-02-19 DIAGNOSIS — F209 Schizophrenia, unspecified: Secondary | ICD-10-CM | POA: Diagnosis present

## 2018-02-19 HISTORY — DX: Post-traumatic stress disorder, unspecified: F43.10

## 2018-02-19 HISTORY — DX: Gestational diabetes mellitus in pregnancy, unspecified control: O24.419

## 2018-02-19 LAB — COMPREHENSIVE METABOLIC PANEL
ALT: 12 U/L — AB (ref 14–54)
AST: 15 U/L (ref 15–41)
Albumin: 4.2 g/dL (ref 3.5–5.0)
Alkaline Phosphatase: 52 U/L (ref 38–126)
Anion gap: 12 (ref 5–15)
BUN: 19 mg/dL (ref 6–20)
CALCIUM: 8.6 mg/dL — AB (ref 8.9–10.3)
CO2: 24 mmol/L (ref 22–32)
CREATININE: 0.55 mg/dL (ref 0.44–1.00)
Chloride: 105 mmol/L (ref 101–111)
GFR calc Af Amer: >60 mL/min (ref >60)
Glucose, Bld: 109 mg/dL — ABNORMAL HIGH (ref 65–99)
POTASSIUM: 3.4 mmol/L — AB (ref 3.5–5.1)
Sodium: 141 mmol/L (ref 135–145)
Total Bilirubin: 0.5 mg/dL (ref 0.3–1.2)
Total Protein: 7.6 g/dL (ref 6.5–8.1)

## 2018-02-19 LAB — CBC
HCT: 38 % (ref 36.0–46.0)
Hemoglobin: 12.4 g/dL (ref 12.0–15.0)
MCH: 33.3 pg (ref 26.0–34.0)
MCHC: 32.6 g/dL (ref 30.0–36.0)
MCV: 102.2 fL — ABNORMAL HIGH (ref 78.0–100.0)
PLATELETS: 128 10*3/uL — AB (ref 150–400)
RBC: 3.72 MIL/uL — ABNORMAL LOW (ref 3.87–5.11)
RDW: 12.5 % (ref 11.5–15.5)
WBC: 13.6 10*3/uL — AB (ref 4.0–10.5)

## 2018-02-19 LAB — URINALYSIS, ROUTINE W REFLEX MICROSCOPIC
BILIRUBIN URINE: NEGATIVE
Glucose, UA: NEGATIVE mg/dL
Hgb urine dipstick: NEGATIVE
KETONES UR: 5 mg/dL — AB
LEUKOCYTES UA: NEGATIVE
NITRITE: NEGATIVE
PROTEIN: NEGATIVE mg/dL
Specific Gravity, Urine: 1.043 — ABNORMAL HIGH (ref 1.005–1.030)
pH: 7 (ref 5.0–8.0)

## 2018-02-19 LAB — I-STAT BETA HCG BLOOD, ED (MC, WL, AP ONLY): I-stat hCG, quantitative: <5 m[IU]/mL (ref <5)

## 2018-02-19 LAB — LIPASE, BLOOD: Lipase: 81 U/L — ABNORMAL HIGH (ref 11–51)

## 2018-02-19 LAB — POC OCCULT BLOOD, ED: FECAL OCCULT BLD: POSITIVE — AB

## 2018-02-19 MED ORDER — IOPAMIDOL (ISOVUE-300) INJECTION 61%
100.0000 mL | Freq: Once | INTRAVENOUS | Status: AC | PRN
  Administered 2018-02-19: 100 mL via INTRAVENOUS

## 2018-02-19 MED ORDER — MORPHINE SULFATE (PF) 2 MG/ML IV SOLN
1.0000 mg | INTRAVENOUS | Status: DC | PRN
  Administered 2018-02-19 – 2018-02-26 (×22): 1 mg via INTRAVENOUS
  Filled 2018-02-19 (×22): qty 1

## 2018-02-19 MED ORDER — IBUPROFEN 200 MG PO TABS
600.0000 mg | ORAL_TABLET | Freq: Three times a day (TID) | ORAL | Status: DC
  Administered 2018-02-19 – 2018-02-24 (×13): 600 mg via ORAL
  Filled 2018-02-19 (×14): qty 3

## 2018-02-19 MED ORDER — ACETAMINOPHEN 500 MG PO TABS
1000.0000 mg | ORAL_TABLET | Freq: Three times a day (TID) | ORAL | Status: DC
  Administered 2018-02-19 – 2018-02-28 (×24): 1000 mg via ORAL
  Filled 2018-02-19 (×25): qty 2

## 2018-02-19 MED ORDER — ONDANSETRON 4 MG PO TBDP: 4.0000 mg | ORAL_TABLET | Freq: Four times a day (QID) | ORAL | Status: DC | PRN

## 2018-02-19 MED ORDER — MORPHINE SULFATE (PF) 4 MG/ML IV SOLN
6.0000 mg | Freq: Once | INTRAVENOUS | Status: AC
  Administered 2018-02-19: 6 mg via INTRAVENOUS
  Filled 2018-02-19: qty 2

## 2018-02-19 MED ORDER — HYDRALAZINE HCL 20 MG/ML IJ SOLN: 10.0000 mg | INTRAMUSCULAR | Status: DC | PRN

## 2018-02-19 MED ORDER — POTASSIUM CHLORIDE CRYS ER 20 MEQ PO TBCR
40.0000 meq | EXTENDED_RELEASE_TABLET | Freq: Once | ORAL | Status: AC
  Administered 2018-02-19: 40 meq via ORAL
  Filled 2018-02-19: qty 2

## 2018-02-19 MED ORDER — ONDANSETRON HCL 4 MG/2ML IJ SOLN
4.0000 mg | Freq: Four times a day (QID) | INTRAMUSCULAR | Status: DC | PRN
  Administered 2018-02-20 – 2018-02-25 (×4): 4 mg via INTRAVENOUS
  Filled 2018-02-19 (×4): qty 2

## 2018-02-19 MED ORDER — OXYCODONE HCL 5 MG PO TABS
5.0000 mg | ORAL_TABLET | ORAL | Status: DC | PRN
  Administered 2018-02-19 – 2018-02-21 (×5): 10 mg via ORAL
  Administered 2018-02-21: 5 mg via ORAL
  Administered 2018-02-21: 10 mg via ORAL
  Administered 2018-02-22: 5 mg via ORAL
  Administered 2018-02-22 – 2018-02-28 (×13): 10 mg via ORAL
  Filled 2018-02-19 (×8): qty 2
  Filled 2018-02-19: qty 1
  Filled 2018-02-19 (×2): qty 2
  Filled 2018-02-19: qty 1
  Filled 2018-02-19 (×9): qty 2

## 2018-02-19 MED ORDER — AMLODIPINE BESYLATE 5 MG PO TABS
5.0000 mg | ORAL_TABLET | Freq: Every day | ORAL | Status: DC
  Administered 2018-02-19 – 2018-02-28 (×10): 5 mg via ORAL
  Filled 2018-02-19 (×10): qty 1

## 2018-02-19 MED ORDER — MORPHINE SULFATE (PF) 4 MG/ML IV SOLN
4.0000 mg | Freq: Once | INTRAVENOUS | Status: AC
  Administered 2018-02-19: 4 mg via INTRAVENOUS
  Filled 2018-02-19: qty 1

## 2018-02-19 MED ORDER — DIPHENHYDRAMINE HCL 50 MG/ML IJ SOLN: 25.0000 mg | Freq: Four times a day (QID) | INTRAMUSCULAR | Status: DC | PRN

## 2018-02-19 MED ORDER — KCL IN DEXTROSE-NACL 20-5-0.45 MEQ/L-%-% IV SOLN
INTRAVENOUS | Status: DC
  Administered 2018-02-19 – 2018-02-23 (×3): via INTRAVENOUS
  Filled 2018-02-19 (×4): qty 1000

## 2018-02-19 MED ORDER — IOPAMIDOL (ISOVUE-300) INJECTION 61%
INTRAVENOUS | Status: AC
  Filled 2018-02-19: qty 100

## 2018-02-19 MED ORDER — POLYETHYLENE GLYCOL 3350 17 G PO PACK: 17.0000 g | PACK | Freq: Every day | ORAL | Status: DC | PRN

## 2018-02-19 MED ORDER — PIPERACILLIN-TAZOBACTAM 3.375 G IVPB 30 MIN
3.3750 g | Freq: Once | INTRAVENOUS | Status: AC
  Administered 2018-02-19: 3.375 g via INTRAVENOUS
  Filled 2018-02-19: qty 50

## 2018-02-19 MED ORDER — DIPHENHYDRAMINE HCL 25 MG PO CAPS
25.0000 mg | ORAL_CAPSULE | Freq: Four times a day (QID) | ORAL | Status: DC | PRN
  Administered 2018-02-24: 25 mg via ORAL
  Filled 2018-02-19: qty 1

## 2018-02-19 MED ORDER — ENOXAPARIN SODIUM 40 MG/0.4ML ~~LOC~~ SOLN
40.0000 mg | SUBCUTANEOUS | Status: DC
  Administered 2018-02-19: 40 mg via SUBCUTANEOUS
  Filled 2018-02-19: qty 0.4

## 2018-02-19 MED ORDER — PIPERACILLIN-TAZOBACTAM 3.375 G IVPB
3.3750 g | Freq: Three times a day (TID) | INTRAVENOUS | Status: DC
  Administered 2018-02-19 – 2018-02-26 (×18): 3.375 g via INTRAVENOUS
  Filled 2018-02-19 (×20): qty 50

## 2018-02-19 MED ORDER — SODIUM CHLORIDE 0.9 % IV BOLUS
1000.0000 mL | Freq: Once | INTRAVENOUS | Status: AC
  Administered 2018-02-19: 1000 mL via INTRAVENOUS

## 2018-02-19 NOTE — ED Triage Notes (Addendum)
Per EMS- patient c/o constipation x 2 days and rectal pain. Patient also reported to EMS that she has a possible rectal abscess. Patient had N/V x 1 today.  Patient ws taken to the bathroom and had urinary hesitancy and pressure.

## 2018-02-19 NOTE — ED Notes (Signed)
ED TO INPATIENT HANDOFF REPORT  Name/Age/Gender Stephanie [name] 37 y.o. female  Code Status    Code Status Orders  (From admission, onward)        Start     Ordered   02/19/18 1659  Full code  Continuous     02/19/18 1700    Code Status History    Date Active Date Inactive Code Status Order ID Comments User Context   03/21/2017 1610 03/25/2017 1617 Full Code [id number]  Lorette Ang ED   05/29/2015 0211 06/04/2015 1711 Full Code [id number]  Armandina Gemma, MD Inpatient   05/28/2015 2147 05/29/2015 0211 Full Code [id number]  Armandina Gemma, MD ED      Home/SNF/Other Home  Chief Complaint Abscess  Level of Care/Admitting Diagnosis ED Disposition    ED Disposition Condition [facility] Area: [facility] [[id number]]  Level of Care: Med-Surg [16]  Diagnosis: Perirectal abscess [956387]  Admitting Physician: CCS, Circle  Attending Physician: CCS, MD [[id number]  PT Class (Do Not Modify): Observation [104]  PT Acc Code (Do Not Modify): Observation [10022]       Medical History Past Medical History:  Diagnosis Date  . Anemia   . Depression   . Diabetes mellitus    gestational only  . H/O cesarean section 2010  . Headache    migraines  . Hemorrhoids   . Hypertension   . Panic attacks   . Schizophrenia ([place])   . Vaginal delivery 2004 , 2008, 2009    Allergies No Known Allergies  IV Location/Drains/Wounds Patient Lines/Drains/Airways Status   Active Line/Drains/Airways    Name:   Placement date:   Placement time:   Site:   Days:   Peripheral IV 06/03/15 Right Forearm   06/03/15    2330    Forearm   992   Peripheral IV 02/19/18 Left Antecubital   02/19/18    1353    Antecubital   less than 1   Closed System Drain Left Other (Comment) Bulb (JP)   05/31/15    -    Other (Comment)   995   Closed System Drain 1 Left Buttock Bulb (JP) 10 Fr.   03/22/17    0856    Buttock   334   Closed System Drain 1 Left Buttock Bulb (JP) 10 Fr.    07/26/17    0912    Buttock   208   AIRWAYS   09/25/17    1337     147   AIRWAYS   09/25/17    1337     147   Incision (Closed) 05/29/15 Rectum   05/29/15    0034     997          Labs/Imaging Results for orders placed or performed during the hospital encounter of 02/19/18 (from the past 48 hour(s))  Lipase, blood     Status: Abnormal   Collection Time: 02/19/18 11:49 AM  Result Value Ref Range   Lipase 81 (H) 11 - 51 U/L    Comment: Performed at [facility], [place] [address], [place], [place] [address]  Comprehensive metabolic panel     Status: Abnormal   Collection Time: 02/19/18 11:49 AM  Result Value Ref Range   Sodium 141 135 - 145 mmol/L   Potassium 3.4 (L) 3.5 - 5.1 mmol/L   Chloride 105 101 - 111 mmol/L   CO2 24 22 - 32 mmol/L   Glucose,  Bld 109 (H) 65 - 99 mg/dL   BUN 19 6 - 20 mg/dL   Creatinine, Ser 0.55 0.44 - 1.00 mg/dL   Calcium 8.6 (L) 8.9 - 10.3 mg/dL   Total Protein 7.6 6.5 - 8.1 g/dL   Albumin 4.2 3.5 - 5.0 g/dL   AST 15 15 - 41 U/L   ALT 12 (L) 14 - 54 U/L   Alkaline Phosphatase 52 38 - 126 U/L   Total Bilirubin 0.5 0.3 - 1.2 mg/dL   GFR calc non Af Amer >60 >60 mL/min   GFR calc Af Amer >60 >60 mL/min    Comment: (NOTE) The eGFR has been calculated using the CKD EPI equation. This calculation has not been validated in all clinical situations. eGFR's persistently <60 mL/min signify possible Chronic Kidney Disease.    Anion gap 12 5 - 15    Comment: Performed at [facility], [place] [address], [place], [place] [address]  CBC     Status: Abnormal   Collection Time: 02/19/18 11:49 AM  Result Value Ref Range   WBC 13.6 (H) 4.0 - 10.5 K/uL   RBC 3.72 (L) 3.87 - 5.11 MIL/uL   Hemoglobin 12.4 12.0 - 15.0 g/dL   HCT 38.0 36.0 - 46.0 %   MCV 102.2 (H) 78.0 - 100.0 fL   MCH 33.3 26.0 - 34.0 pg   MCHC 32.6 30.0 - 36.0 g/dL   RDW 12.5 11.5 - 15.5 %   Platelets 128 (L) 150 - 400 K/uL    Comment: Performed at [facility], [place] [address], [place], [place] [address]  I-Stat beta hCG blood, ED     Status: None   Collection Time: 02/19/18 11:57 AM  Result Value Ref Range   I-stat hCG, quantitative <5.0 <5 mIU/mL   Comment 3            Comment:   GEST. AGE      CONC.  (mIU/mL)   <=1 WEEK        5 - 50     2 WEEKS       50 - 500     3 WEEKS       100 - 10,000     4 WEEKS     1,000 - 30,000        FEMALE AND NON-PREGNANT FEMALE:     LESS THAN 5 mIU/mL   POC occult blood, ED     Status: Abnormal   Collection Time: 02/19/18 12:36 PM  Result Value Ref Range   Fecal Occult Bld POSITIVE (A) NEGATIVE  Urinalysis, Routine w reflex microscopic     Status: Abnormal   Collection Time: 02/19/18  3:26 PM  Result Value Ref Range   Color, Urine YELLOW YELLOW   APPearance HAZY (A) CLEAR   Specific Gravity, Urine 1.043 (H) 1.005 - 1.030   pH 7.0 5.0 - 8.0   Glucose, UA NEGATIVE NEGATIVE mg/dL   Hgb urine dipstick NEGATIVE NEGATIVE   Bilirubin Urine NEGATIVE NEGATIVE   Ketones, ur 5 (A) NEGATIVE mg/dL   Protein, ur NEGATIVE NEGATIVE mg/dL   Nitrite NEGATIVE NEGATIVE   Leukocytes, UA NEGATIVE NEGATIVE    Comment: Performed at [facility], [place] [address] [place], [place] [address]   Ct Abdomen Pelvis W Contrast  Result Date: 02/19/2018 CLINICAL DATA:  Constipation and rectal pain, history of previous rectal abscess EXAM: CT ABDOMEN AND PELVIS WITH CONTRAST TECHNIQUE: Multidetector CT imaging of the abdomen  and pelvis was performed using the standard protocol following bolus administration of intravenous contrast. CONTRAST:  182m ISOVUE-300 IOPAMIDOL (ISOVUE-300) INJECTION 61% COMPARISON:  07/16/2017, 08/19/2017 FINDINGS: Lower chest: No acute abnormality. Hepatobiliary: No focal liver abnormality is seen. No gallstones, gallbladder wall thickening, or biliary dilatation. Pancreas: Unremarkable. No pancreatic ductal dilatation or surrounding inflammatory changes. Spleen: Normal in  size without focal abnormality. Adrenals/Urinary Tract: Adrenal glands are unremarkable. Kidneys are normal, without renal calculi, focal lesion, or hydronephrosis. Bladder is unremarkable. Stomach/Bowel: Adjacent to the distal aspect of the rectum on the left, there is a large complex appearing fluid collection measuring 7.7 x 6.2 x 7.1 cm in greatest dimension. This is significantly enlarged when compared with the prior MRI examination. This causes significant mass effect upon the adjacent rectum with displacement to the right. The previously seen enhancing nodules anterior to the distal sacrum are again seen and stable. Additionally the rectosigmoid is filled with fluid likely related to difficulty passing stool. No other obstructive changes are seen. Vascular/Lymphatic: No significant vascular findings are present. No enlarged abdominal or pelvic lymph nodes. Reproductive: Uterus and bilateral adnexa are unremarkable. Other: No free fluid is noted.  No definitive hernia is identified. Musculoskeletal: No acute or significant osseous findings. IMPRESSION: Large complex cystic collection identified in the left perirectal region increased in size when compared with the prior exam. This causes mass effect upon the adjacent rectum and some backup of fluid and stool within the distal colon. The adjacent enhancing nodules anterior to the sacrum are again seen and stable. No other focal abnormality is noted. Electronically Signed   By: MInez CatalinaM.D.   On: 02/19/2018 15:22    Pending Labs Unresulted Labs (From admission, onward)   Start     Ordered   02/26/18 0500  Creatinine, serum  (enoxaparin (LOVENOX)    CrCl >/= 30 ml/min)  Weekly,   R    Comments:  while on enoxaparin therapy    02/19/18 1700   02/20/18 0500  CBC  Tomorrow morning,   R     02/19/18 1700   02/20/18 06834 Basic metabolic panel  Tomorrow morning,   R    Comments:  If K < 3.5, give 439m KCl in 10MEq runs per protocol.  Pharmacy may  adjust dosing strength, schedule, rate of infusion, etc as needed to optimize therapy    02/19/18 1723      Vitals/Pain Today's Vitals   02/19/18 1232 02/19/18 1301 02/19/18 1530 02/19/18 1625  BP:  129/88 (!) 149/82   Pulse:  97 97   Resp:  18 18   Temp:  99 F (37.2 C)    TempSrc:  Oral    SpO2:  98% 98%   Weight:      Height:      PainSc: 3    10-Worst pain ever    Isolation Precautions No active isolations  Medications Medications  iopamidol (ISOVUE-300) 61 % injection (has no administration in time range)  enoxaparin (LOVENOX) injection 40 mg (has no administration in time range)  dextrose 5 % and 0.45 % NaCl with KCl 20 mEq/L infusion (has no administration in time range)  acetaminophen (TYLENOL) tablet 1,000 mg (has no administration in time range)  ibuprofen (ADVIL,MOTRIN) tablet 600 mg (has no administration in time range)  oxyCODONE (Oxy IR/ROXICODONE) immediate release tablet 5-10 mg (has no administration in time range)  morphine 2 MG/ML injection 1 mg (has no administration in time range)  diphenhydrAMINE (BENADRYL) capsule 25  mg (has no administration in time range)    Or  diphenhydrAMINE (BENADRYL) injection 25 mg (has no administration in time range)  polyethylene glycol (MIRALAX / GLYCOLAX) packet 17 g (has no administration in time range)  ondansetron (ZOFRAN-ODT) disintegrating tablet 4 mg (has no administration in time range)    Or  ondansetron (ZOFRAN) injection 4 mg (has no administration in time range)  hydrALAZINE (APRESOLINE) injection 10 mg (has no administration in time range)  amLODipine (NORVASC) tablet 5 mg (has no administration in time range)  potassium chloride SA (K-DUR,KLOR-CON) CR tablet 40 mEq (has no administration in time range)  piperacillin-tazobactam (ZOSYN) IVPB 3.375 g (has no administration in time range)  morphine 4 MG/ML injection 4 mg (4 mg Intravenous Given 02/19/18 1149)  sodium chloride 0.9 % bolus 1,000 mL (0 mLs  Intravenous Stopped 02/19/18 1529)  iopamidol (ISOVUE-300) 61 % injection 100 mL (100 mLs Intravenous Contrast Given 02/19/18 1504)  piperacillin-tazobactam (ZOSYN) IVPB 3.375 g (0 g Intravenous Stopped 02/19/18 1707)  morphine 4 MG/ML injection 6 mg (6 mg Intravenous Given 02/19/18 1630)    Mobility walks

## 2018-02-19 NOTE — Progress Notes (Signed)
Pharmacy Antibiotic Note  Stephanie [name] is a 37 y.o. female admitted on 02/19/2018 with peri-rectal abscess.  Pharmacy has been consulted for zosyn dosing. She has h/o peri-rectal abscesses.  One culture available in [facility], reveals strep Grp D (likely S. Bovis).  Colonoscopies x4 unsuccessful in Dec/Jan due to poor bowel prep.    Plan:  Zosyn 3.375gm IV q8h over 4h infusion  Await plans for possible OR  Do not anticipate need to adjustment, follow peripherally  Height:  (167.6 cm) Weight: 150 lb (68 kg) IBW/kg (Calculated) : 59.3  Temp (24hrs), Avg:98.5 F (36.9 C), Min:98 F (36.7 C), Max:99 F (37.2 C)  Recent Labs  Lab 02/19/18 1149  WBC 13.6*  CREATININE 0.55    Estimated Creatinine Clearance: 92.8 mL/min (by C-G formula based on SCr of 0.55 mg/dL).    No Known Allergies  Antimicrobials this admission: 5/29 zosyn >>  Dose adjustments this admission:  Microbiology results:   Thank you for allowing pharmacy to be a part of this patient's care.  Juliette Alcide, PharmD, BCPS.   Pager: [phone] 02/19/2018 5:40 PM

## 2018-02-19 NOTE — H&P (Addendum)
[facility] Admission Note  Stephanie [name] 06-03-1981  [id number].    Requesting MD: Roderic Palau, MD Chief Complaint/Reason for Consult: perirectal abscess  HPI:  Stephanie [name] is a 37 year old African-American female with a past medical history of HTN,  perirectal abscess, anxiety, depression, and PTSD who presented to [facility] emergency department with 24 hours of perirectal pain, similar to pain she is experienced in the past.  Her last bowel movement was today and was loose.  She denies hematochezia or melena.  She denies fever, chills, nausea, vomiting.    She was diagnosed with perirectal abscess in 2016 when she had an incision and drainage performed.  Since this time she has had 3 percutaneous drainage of recurrent fluid collections in the retrorectal space.  She has been seen in our office by Dr. Dema Severin who ordered an MRI in December 2018 which showed a complex fluid collection in the supralevator left posterior ischio rectal fossa, no masses or other abnormalities were noted.  Dr. Dema Severin attempted for colonoscopies in December 2018 in January 2019, neither of which were successful due to poor bowel prep.  The patient was referred to [facility] for evaluation by Dr. Morton Stall but has been unable to go to those appointments due to transportation issues. She has 5 children. She denies any drug allergies or the use of blood thinning medications.  Work-up in the emergency department was significant for leukocytosis of 13,600, negative UA, potassium 3.4, lipase within normal limits, beta-hCG negative, occult blood positive, and a CT scan of the abdomen and pelvis showing a large complex cystic collection in the left perirectal region, increased in size compared to previous studies.  The fluid collection is 7 x 7 cm and is causing external compression of the adjacent rectum.  ROS: Review of Systems  Gastrointestinal: Positive for diarrhea. Negative for nausea and vomiting.   Genitourinary: Positive for frequency.  Musculoskeletal: Positive for back pain.  Psychiatric/Behavioral: Positive for depression. Negative for hallucinations, substance abuse and suicidal ideas. The patient is nervous/anxious.   All other systems reviewed and are negative.   Family History  Problem Relation Age of Onset  . Hypertension Mother   . Hypertension Other     Past Medical History:  Diagnosis Date  . Anemia   . Depression   . Diabetes mellitus    gestational only  . H/O cesarean section 2010  . Headache    migraines  . Hemorrhoids   . Hypertension   . Panic attacks   . Schizophrenia ([place])   . Vaginal delivery 2004 , 2008, 2009    Past Surgical History:  Procedure Laterality Date  . CESAREAN SECTION    . COLONOSCOPY WITH PROPOFOL  08/19/2017   Procedure: COLONOSCOPY WITH PROPOFOL;  Surgeon: Ileana Roup, MD;  Location: [facility];  Service: General;;  . FLEXIBLE SIGMOIDOSCOPY  09/25/2017   Procedure: FLEXIBLE SIGMOIDOSCOPY;  Surgeon: Ileana Roup, MD;  Location: [facility];  Service: General;;  . i and d perirectal abcess  2016  . INCISION AND DRAINAGE PERIRECTAL ABSCESS N/A 05/28/2015   Procedure: IRRIGATION AND DEBRIDEMENT PERIRECTAL ABSCESS;  Surgeon: Armandina Gemma, MD;  Location: [facility];  Service: General;  Laterality: N/A;  . IR RADIOLOGIST EVAL & MGMT  04/02/2017  . TUBAL LIGATION  2010    Social History:  reports that she has been smoking cigarettes.  She has been smoking about 0.50 packs per day. She has never used smokeless tobacco. She reports that she drinks alcohol.  She reports that she has current or past drug history. Drug: Marijuana. Frequency: 3.00 times per week.  Allergies: No Known Allergies   (Not in a hospital admission)  Blood pressure (!) 149/82, pulse 97, temperature 99 F (37.2 C), temperature source Oral, resp. rate 18, height '5\' 6"'  (1.676 m), weight 68 kg (150 lb), last menstrual period 01/20/2018, SpO2 98  %. Physical Exam: Physical Exam  Constitutional: She is oriented to person, place, and time. She appears well-developed and well-nourished. No distress.  HENT:  Head: Normocephalic and atraumatic.  Right Ear: External ear normal.  Left Ear: External ear normal.  Mouth/Throat: Oropharynx is clear and moist.  Eyes: EOM are normal. Right eye exhibits no discharge. Left eye exhibits no discharge.  Neck: Normal range of motion. Neck supple. No tracheal deviation present. No thyromegaly present.  Cardiovascular: Normal rate, regular rhythm, normal heart sounds and intact distal pulses. Exam reveals no friction rub.  No murmur heard. Pulmonary/Chest: Effort normal and breath sounds normal. No stridor. No respiratory distress.  Abdominal: Soft. Bowel sounds are normal. She exhibits no distension. There is no tenderness. There is no guarding.  Genitourinary:  Genitourinary Comments: External anal exam normal. Appropriate sphincter tone. TTP in the 6 o'clock 3-6 o'clock position. No palpable masses. No gross blood.  Musculoskeletal: Normal range of motion. She exhibits no edema or deformity.  Neurological: She is alert and oriented to person, place, and time.  Skin: Skin is warm and dry. She is not diaphoretic.    Results for orders placed or performed during the hospital encounter of 02/19/18 (from the past 48 hour(s))  Lipase, blood     Status: Abnormal   Collection Time: 02/19/18 11:49 AM  Result Value Ref Range   Lipase 81 (H) 11 - 51 U/L    Comment: Performed at [facility], [place] [address] [place], [place] [address]  Comprehensive metabolic panel     Status: Abnormal   Collection Time: 02/19/18 11:49 AM  Result Value Ref Range   Sodium 141 135 - 145 mmol/L   Potassium 3.4 (L) 3.5 - 5.1 mmol/L   Chloride 105 101 - 111 mmol/L   CO2 24 22 - 32 mmol/L   Glucose, Bld 109 (H) 65 - 99 mg/dL   BUN 19 6 - 20 mg/dL   Creatinine, Ser 0.55 0.44 - 1.00 mg/dL   Calcium 8.6 (L)  8.9 - 10.3 mg/dL   Total Protein 7.6 6.5 - 8.1 g/dL   Albumin 4.2 3.5 - 5.0 g/dL   AST 15 15 - 41 U/L   ALT 12 (L) 14 - 54 U/L   Alkaline Phosphatase 52 38 - 126 U/L   Total Bilirubin 0.5 0.3 - 1.2 mg/dL   GFR calc non Af Amer >60 >60 mL/min   GFR calc Af Amer >60 >60 mL/min    Comment: (NOTE) The eGFR has been calculated using the CKD EPI equation. This calculation has not been validated in all clinical situations. eGFR's persistently <60 mL/min signify possible Chronic Kidney Disease.    Anion gap 12 5 - 15    Comment: Performed at [facility], [place] [address], [place], [place] [address]  CBC     Status: Abnormal   Collection Time: 02/19/18 11:49 AM  Result Value Ref Range   WBC 13.6 (H) 4.0 - 10.5 K/uL   RBC 3.72 (L) 3.87 - 5.11 MIL/uL   Hemoglobin 12.4 12.0 - 15.0 g/dL   HCT 38.0 36.0 - 46.0 %  MCV 102.2 (H) 78.0 - 100.0 fL   MCH 33.3 26.0 - 34.0 pg   MCHC 32.6 30.0 - 36.0 g/dL   RDW 12.5 11.5 - 15.5 %   Platelets 128 (L) 150 - 400 K/uL    Comment: Performed at [facility], [place] [address] [place], [place] [address]  I-Stat beta hCG blood, ED     Status: None   Collection Time: 02/19/18 11:57 AM  Result Value Ref Range   I-stat hCG, quantitative <5.0 <5 mIU/mL   Comment 3            Comment:   GEST. AGE      CONC.  (mIU/mL)   <=1 WEEK        5 - 50     2 WEEKS       50 - 500     3 WEEKS       100 - 10,000     4 WEEKS     1,000 - 30,000        FEMALE AND NON-PREGNANT FEMALE:     LESS THAN 5 mIU/mL   POC occult blood, ED     Status: Abnormal   Collection Time: 02/19/18 12:36 PM  Result Value Ref Range   Fecal Occult Bld POSITIVE (A) NEGATIVE  Urinalysis, Routine w reflex microscopic     Status: Abnormal   Collection Time: 02/19/18  3:26 PM  Result Value Ref Range   Color, Urine YELLOW YELLOW   APPearance HAZY (A) CLEAR   Specific Gravity, Urine 1.043 (H) 1.005 - 1.030   pH 7.0 5.0 - 8.0   Glucose, UA NEGATIVE NEGATIVE  mg/dL   Hgb urine dipstick NEGATIVE NEGATIVE   Bilirubin Urine NEGATIVE NEGATIVE   Ketones, ur 5 (A) NEGATIVE mg/dL   Protein, ur NEGATIVE NEGATIVE mg/dL   Nitrite NEGATIVE NEGATIVE   Leukocytes, UA NEGATIVE NEGATIVE    Comment: Performed at [facility], [place] [address], [place], [place] [address]   Ct Abdomen Pelvis W Contrast  Result Date: 02/19/2018 CLINICAL DATA:  Constipation and rectal pain, history of previous rectal abscess EXAM: CT ABDOMEN AND PELVIS WITH CONTRAST TECHNIQUE: Multidetector CT imaging of the abdomen and pelvis was performed using the standard protocol following bolus administration of intravenous contrast. CONTRAST:  166m ISOVUE-300 IOPAMIDOL (ISOVUE-300) INJECTION 61% COMPARISON:  07/16/2017, 08/19/2017 FINDINGS: Lower chest: No acute abnormality. Hepatobiliary: No focal liver abnormality is seen. No gallstones, gallbladder wall thickening, or biliary dilatation. Pancreas: Unremarkable. No pancreatic ductal dilatation or surrounding inflammatory changes. Spleen: Normal in size without focal abnormality. Adrenals/Urinary Tract: Adrenal glands are unremarkable. Kidneys are normal, without renal calculi, focal lesion, or hydronephrosis. Bladder is unremarkable. Stomach/Bowel: Adjacent to the distal aspect of the rectum on the left, there is a large complex appearing fluid collection measuring 7.7 x 6.2 x 7.1 cm in greatest dimension. This is significantly enlarged when compared with the prior MRI examination. This causes significant mass effect upon the adjacent rectum with displacement to the right. The previously seen enhancing nodules anterior to the distal sacrum are again seen and stable. Additionally the rectosigmoid is filled with fluid likely related to difficulty passing stool. No other obstructive changes are seen. Vascular/Lymphatic: No significant vascular findings are present. No enlarged abdominal or pelvic lymph nodes. Reproductive: Uterus and  bilateral adnexa are unremarkable. Other: No free fluid is noted.  No definitive hernia is identified. Musculoskeletal: No acute or significant osseous findings. IMPRESSION: Large complex cystic collection identified in  the left perirectal region increased in size when compared with the prior exam. This causes mass effect upon the adjacent rectum and some backup of fluid and stool within the distal colon. The adjacent enhancing nodules anterior to the sacrum are again seen and stable. No other focal abnormality is noted. Electronically Signed   By: Inez Catalina M.D.   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 I will admit the patient, start IV abx, and make NPO after MN for possible procedure tomorrow. I plan to discuss this case with Dr. Dema Severin tomorrow as well. Patient is agreeable to admission and possible procedure.   Jill Alexanders, [facility] Surgery 02/19/2018, 5:01 PM Pager: [phone] Consults: [phone] Mon-Fri 7:00 am-4:30 pm Sat-Sun 7:00 am-11:30 am

## 2018-02-19 NOTE — ED Provider Notes (Signed)
[facility]-EMERGENCY DEPT Provider Note   CSN: [id number] Arrival date & time: 02/19/18  4132     History   Chief Complaint Chief Complaint  Patient presents with  . Rectal Pain  . Constipation  . Emesis  . urinary hesitancy    HPI Stephanie [name] is a 37 y.o. female.  HPI   37 year old female with history of internal hemorrhoid, perirectal abscess, pelvic abscess, depression, brought here via EMS from home for evaluation of rectal pain.  Patient report intermittent pain in her rectum ongoing for the past 2 weeks worse in the past several days.  Pain is described as a sharp throbbing uncomfortable sensation now progressively worse especially with straining.  States she does not have much of an appetite and felt dehydrated.  She cannot recall the last bowel movement that she had.  She felt that her symptom is similar to prior perirectal abscess requiring surgical intervention in the past.  She mentioned being evaluated by her primary care doctor yesterday for her complaint and was told that she has 3 hemorrhoids.  She was prescribed some types of cream however Medicaid did not cover for it.  She went to the ER but after a long wait she returns home.  States that the pain was unbearable last night prompting her to return to the ED.  Endorsed subjective fever and chills yesterday.  Denies any specific treatment tried.  She is up-to-date with tetanus.  She did report mild urinary discomfort since yesterday.  Denies any significant abdominal pain.  No vaginal bleeding or vaginal discharge.  Past Medical History:  Diagnosis Date  . Anemia   . Depression   . Diabetes mellitus    gestational only  . H/O cesarean section 2010  . Headache    migraines  . Hemorrhoids   . Hypertension   . Panic attacks   . Schizophrenia (HCC)   . Vaginal delivery 2004 , 2008, 2009    Patient Active Problem List   Diagnosis Date Noted  . Sweaty palms 10/25/2017  . Encounter to  establish care 10/25/2017  . Depression 10/25/2017  . Pelvic abscess in female 03/21/2017  . Perirectal abscess 05/28/2015  . Internal hemorrhoids - 3 column 05/01/2013    Past Surgical History:  Procedure Laterality Date  . CESAREAN SECTION    . COLONOSCOPY WITH PROPOFOL  08/19/2017   Procedure: COLONOSCOPY WITH PROPOFOL;  Surgeon: Andria Meuse, MD;  Location: [facility];  Service: General;;  . FLEXIBLE SIGMOIDOSCOPY  09/25/2017   Procedure: FLEXIBLE SIGMOIDOSCOPY;  Surgeon: Andria Meuse, MD;  Location: [facility];  Service: General;;  . i and d perirectal abcess  2016  . INCISION AND DRAINAGE PERIRECTAL ABSCESS N/A 05/28/2015   Procedure: IRRIGATION AND DEBRIDEMENT PERIRECTAL ABSCESS;  Surgeon: Darnell Level, MD;  Location: [facility];  Service: General;  Laterality: N/A;  . IR RADIOLOGIST EVAL & MGMT  04/02/2017  . TUBAL LIGATION  2010     OB History    Gravida  4   Para  4   Term  3   Preterm  1   AB      Living  5     SAB      TAB      Ectopic      Multiple      Live Births               Home Medications    Prior to Admission medications   Medication  Sig Start Date End Date Taking? Authorizing Provider  Aspirin-Salicylamide-Caffeine (BC HEADACHE POWDER PO) Take 1 packet daily as needed by mouth (headaches).    [provider]  meloxicam (MOBIC) 15 MG tablet Take 15 mg by mouth daily as needed (for pain/inflammation.).    [provider]  SUMAtriptan (IMITREX) 25 MG tablet Take 25 mg every 2 (two) hours as needed by mouth for migraine. 07/15/17   [provider]  triamterene-hydrochlorothiazide (MAXZIDE-25) 37.5-25 MG tablet Take 1 tablet by mouth daily.    [provider]    Family History Family History  Problem Relation Age of Onset  . Hypertension Mother   . Hypertension Other     Social History Social History   Tobacco Use  . Smoking status: Current Every Day Smoker    Packs/day: 0.50     Types: Cigarettes  . Smokeless tobacco: Never Used  Substance Use Topics  . Alcohol use: Yes    Comment: ocassionally  . Drug use: Yes    Frequency: 3.0 times per week    Types: Marijuana    Comment: occasionally     Allergies   Patient has no known allergies.   Review of Systems Review of Systems  All other systems reviewed and are negative.    Physical Exam Updated Vital Signs BP (!) 140/101 (BP Location: Right Arm)   Pulse 95   Temp 98 F (36.7 C) (Oral)   Resp 17   Ht  (1.676 m)   Wt 68 kg (150 lb)   LMP 01/20/2018 (Approximate)   SpO2 97%   BMI 24.21 kg/m   Physical Exam  Constitutional: She appears well-developed and well-nourished. No distress.  HENT:  Head: Atraumatic.  Eyes: Conjunctivae are normal.  Neck: Neck supple.  Cardiovascular: Normal rate and regular rhythm.  Pulmonary/Chest: Effort normal and breath sounds normal.  Abdominal: Soft. Bowel sounds are normal. She exhibits no distension. There is no tenderness.  Genitourinary:  Genitourinary Comments: Chaperone present during exam.  Nonthrombosed external hemorrhoid noted.  Several skin tag noted as well.  Pain with insertions of the digits into the rectum.  No obvious mass appreciated.  Soft stool without signs of stool impaction.  No frank bleeding noted.  No appreciable anal fissure, or periectal abscess on initial exam  Neurological: She is alert.  Skin: No rash noted.  Psychiatric: She has a normal mood and affect.  Nursing note and vitals reviewed.    ED Treatments / Results  Labs (all labs ordered are listed, but only abnormal results are displayed) Labs Reviewed  LIPASE, BLOOD - Abnormal; Notable for the following components:      Result Value   Lipase 81 (*)    All other components within normal limits  COMPREHENSIVE METABOLIC PANEL - Abnormal; Notable for the following components:   Potassium 3.4 (*)    Glucose, Bld 109 (*)    Calcium 8.6 (*)    ALT 12 (*)    All other  components within normal limits  CBC - Abnormal; Notable for the following components:   WBC 13.6 (*)    RBC 3.72 (*)    MCV 102.2 (*)    Platelets 128 (*)    All other components within normal limits  POC OCCULT BLOOD, ED - Abnormal; Notable for the following components:   Fecal Occult Bld POSITIVE (*)    All other components within normal limits  URINALYSIS, ROUTINE W REFLEX MICROSCOPIC  I-STAT BETA HCG BLOOD, ED ([facility], [facility],  [facility] ONLY)    EKG None  Radiology Ct Abdomen Pelvis W Contrast  Result Date: 02/19/2018 CLINICAL DATA:  Constipation and rectal pain, history of previous rectal abscess EXAM: CT ABDOMEN AND PELVIS WITH CONTRAST TECHNIQUE: Multidetector CT imaging of the abdomen and pelvis was performed using the standard protocol following bolus administration of intravenous contrast. CONTRAST:  ISOVUE-300 IOPAMIDOL (ISOVUE-300) INJECTION 61% COMPARISON:  07/16/2017, 08/19/2017 FINDINGS: Lower chest: No acute abnormality. Hepatobiliary: No focal liver abnormality is seen. No gallstones, gallbladder wall thickening, or biliary dilatation. Pancreas: Unremarkable. No pancreatic ductal dilatation or surrounding inflammatory changes. Spleen: Normal in size without focal abnormality. Adrenals/Urinary Tract: Adrenal glands are unremarkable. Kidneys are normal, without renal calculi, focal lesion, or hydronephrosis. Bladder is unremarkable. Stomach/Bowel: Adjacent to the distal aspect of the rectum on the left, there is a large complex appearing fluid collection measuring 7.7 x 6.2 x 7.1 cm in greatest dimension. This is significantly enlarged when compared with the prior MRI examination. This causes significant mass effect upon the adjacent rectum with displacement to the right. The previously seen enhancing nodules anterior to the distal sacrum are again seen and stable. Additionally the rectosigmoid is filled with fluid likely related to difficulty passing stool. No other obstructive changes  are seen. Vascular/Lymphatic: No significant vascular findings are present. No enlarged abdominal or pelvic lymph nodes. Reproductive: Uterus and bilateral adnexa are unremarkable. Other: No free fluid is noted.  No definitive hernia is identified. Musculoskeletal: No acute or significant osseous findings. IMPRESSION: Large complex cystic collection identified in the left perirectal region increased in size when compared with the prior exam. This causes mass effect upon the adjacent rectum and some backup of fluid and stool within the distal colon. The adjacent enhancing nodules anterior to the sacrum are again seen and stable. No other focal abnormality is noted. Electronically Signed   By: Alcide Clever M.D.   On: 02/19/2018 15:22    Procedures Procedures (including critical care time)  Medications Ordered in ED Medications  iopamidol (ISOVUE-300) 61 % injection (has no administration in time range)  piperacillin-tazobactam (ZOSYN) IVPB 3.375 g (has no administration in time range)  morphine 4 MG/ML injection 6 mg (has no administration in time range)  morphine 4 MG/ML injection 4 mg (4 mg Intravenous Given 02/19/18 1149)  sodium chloride 0.9 % bolus 1,000 mL (0 mLs Intravenous Stopped 02/19/18 1529)  iopamidol (ISOVUE-300) 61 % injection 100 mL (100 mLs Intravenous Contrast Given 02/19/18 1504)     Initial Impression / Assessment and Plan / ED Course  I have reviewed the triage vital signs and the nursing notes.  Pertinent labs & imaging results that were available during my care of the patient were reviewed by me and considered in my medical decision making (see chart for details).     BP (!) 149/82   Pulse 97   Temp 99 F (37.2 C) (Oral)   Resp 18   Ht  (1.676 m)   Wt 68 kg (150 lb)   LMP 01/20/2018 (Approximate) Comment: negative HCG blood test 02-19-2018  SpO2 98%   BMI 24.21 kg/m    Final Clinical Impressions(s) / ED Diagnoses   Final diagnoses:  Perirectal abscess     ED Discharge Orders    None     11:40 AM Patient complaining of rectal pain.  Has a definite history of perirectal abscess, pelvic abscess and internal hemorrhoid in the past.  She mentioned normally having CT scan to identify the source of  her problem.  Mentioned that she has had several rectal surgeries performed through [facility] most recent was in December.  No evidence of thrombosed hemorrhoid or obvious perirectal abscess on initial examination.  Will obtain abdominal pelvic CT scan for further assessment.  Pain medication given.  IV fluid given.  1:17 PM Fecal occult blood test is positive.  Pregnancy test is negative.  Mildly elevated lipase of 81.  Electrolyte panels are reassuring.  Mild leukocytosis with WBC 13.6.  CT scan of abdomen and pelvis have been ordered.  We will continue with pain management.  4:16 PM CT scan of abdomen pelvis demonstrated a large complex cystic collection measuring 7 x 7 cm identified in the left perirectal region which has increased in size compared to prior exam.  This cause mass-effect upon adjacent rectum and some backward fluid stool within the distal colon.  I discussed this finding with general surgery who will see and admit patient for further management.  Patient voiced understanding and agrees with plan.  Additional pain medication provided for comfort. I have ordered Zosyn antibiotic and IVF.   Fayrene Helper, PA-C 02/19/18 1618    Bethann Berkshire, MD 02/20/18 [id number]

## 2018-02-20 DIAGNOSIS — F329 Major depressive disorder, single episode, unspecified: Secondary | ICD-10-CM | POA: Diagnosis not present

## 2018-02-20 DIAGNOSIS — I10 Essential (primary) hypertension: Secondary | ICD-10-CM | POA: Diagnosis not present

## 2018-02-20 DIAGNOSIS — K648 Other hemorrhoids: Secondary | ICD-10-CM | POA: Diagnosis not present

## 2018-02-20 DIAGNOSIS — E876 Hypokalemia: Secondary | ICD-10-CM | POA: Diagnosis not present

## 2018-02-20 DIAGNOSIS — K644 Residual hemorrhoidal skin tags: Secondary | ICD-10-CM | POA: Diagnosis not present

## 2018-02-20 DIAGNOSIS — F209 Schizophrenia, unspecified: Secondary | ICD-10-CM | POA: Diagnosis not present

## 2018-02-20 DIAGNOSIS — F4310 Post-traumatic stress disorder, unspecified: Secondary | ICD-10-CM | POA: Diagnosis not present

## 2018-02-20 DIAGNOSIS — N739 Female pelvic inflammatory disease, unspecified: Secondary | ICD-10-CM | POA: Diagnosis present

## 2018-02-20 DIAGNOSIS — K611 Rectal abscess: Secondary | ICD-10-CM | POA: Diagnosis not present

## 2018-02-20 DIAGNOSIS — F410 Panic disorder [episodic paroxysmal anxiety] without agoraphobia: Secondary | ICD-10-CM | POA: Diagnosis not present

## 2018-02-20 DIAGNOSIS — K5900 Constipation, unspecified: Secondary | ICD-10-CM | POA: Diagnosis not present

## 2018-02-20 DIAGNOSIS — Z8249 Family history of ischemic heart disease and other diseases of the circulatory system: Secondary | ICD-10-CM | POA: Diagnosis not present

## 2018-02-20 DIAGNOSIS — F1290 Cannabis use, unspecified, uncomplicated: Secondary | ICD-10-CM | POA: Diagnosis not present

## 2018-02-20 DIAGNOSIS — K6289 Other specified diseases of anus and rectum: Secondary | ICD-10-CM | POA: Diagnosis not present

## 2018-02-20 DIAGNOSIS — R935 Abnormal findings on diagnostic imaging of other abdominal regions, including retroperitoneum: Secondary | ICD-10-CM

## 2018-02-20 DIAGNOSIS — R1909 Other intra-abdominal and pelvic swelling, mass and lump: Secondary | ICD-10-CM | POA: Diagnosis not present

## 2018-02-20 DIAGNOSIS — Z79899 Other long term (current) drug therapy: Secondary | ICD-10-CM | POA: Diagnosis not present

## 2018-02-20 DIAGNOSIS — F17210 Nicotine dependence, cigarettes, uncomplicated: Secondary | ICD-10-CM | POA: Diagnosis not present

## 2018-02-20 DIAGNOSIS — K5909 Other constipation: Secondary | ICD-10-CM

## 2018-02-20 DIAGNOSIS — F419 Anxiety disorder, unspecified: Secondary | ICD-10-CM | POA: Diagnosis not present

## 2018-02-20 LAB — CBC
HCT: 34.8 % — ABNORMAL LOW (ref 36.0–46.0)
HEMOGLOBIN: 11.1 g/dL — AB (ref 12.0–15.0)
MCH: 33.1 pg (ref 26.0–34.0)
MCHC: 31.9 g/dL (ref 30.0–36.0)
MCV: 103.9 fL — ABNORMAL HIGH (ref 78.0–100.0)
Platelets: 117 10*3/uL — ABNORMAL LOW (ref 150–400)
RBC: 3.35 MIL/uL — ABNORMAL LOW (ref 3.87–5.11)
RDW: 12.5 % (ref 11.5–15.5)
WBC: 14.6 10*3/uL — AB (ref 4.0–10.5)

## 2018-02-20 LAB — BASIC METABOLIC PANEL
Anion gap: 7 (ref 5–15)
BUN: 11 mg/dL (ref 6–20)
CHLORIDE: 105 mmol/L (ref 101–111)
CO2: 24 mmol/L (ref 22–32)
CREATININE: 0.47 mg/dL (ref 0.44–1.00)
Calcium: 8.2 mg/dL — ABNORMAL LOW (ref 8.9–10.3)
GFR calc non Af Amer: >60 mL/min (ref >60)
Glucose, Bld: 107 mg/dL — ABNORMAL HIGH (ref 65–99)
Potassium: 3.8 mmol/L (ref 3.5–5.1)
SODIUM: 136 mmol/L (ref 135–145)

## 2018-02-20 LAB — SEDIMENTATION RATE: Sed Rate: 20 mm/hr (ref 0–22)

## 2018-02-20 MED ORDER — PEG-KCL-NACL-NASULF-NA ASC-C 100 G PO SOLR
0.5000 | Freq: Once | ORAL | Status: AC
  Administered 2018-02-20: 100 g via ORAL
  Filled 2018-02-20: qty 1

## 2018-02-20 MED ORDER — MAGNESIUM CITRATE PO SOLN
1.0000 | Freq: Two times a day (BID) | ORAL | Status: DC
  Administered 2018-02-20 – 2018-02-21 (×2): 1 via ORAL
  Filled 2018-02-20 (×3): qty 296

## 2018-02-20 MED ORDER — PEG-KCL-NACL-NASULF-NA ASC-C 100 G PO SOLR: 0.5000 | Freq: Once | ORAL | Status: DC

## 2018-02-20 MED ORDER — PEG-KCL-NACL-NASULF-NA ASC-C 100 G PO SOLR: 1.0000 | Freq: Once | ORAL | Status: DC

## 2018-02-20 MED ORDER — ENOXAPARIN SODIUM 40 MG/0.4ML ~~LOC~~ SOLN
40.0000 mg | SUBCUTANEOUS | Status: DC
  Administered 2018-02-21 – 2018-02-23 (×3): 40 mg via SUBCUTANEOUS
  Filled 2018-02-20 (×3): qty 0.4

## 2018-02-20 MED ORDER — MAGNESIUM CITRATE PO SOLN
1.0000 | Freq: Once | ORAL | Status: AC
  Administered 2018-02-20: 1 via ORAL
  Filled 2018-02-20: qty 296

## 2018-02-20 MED ORDER — POLYETHYLENE GLYCOL 3350 17 G PO PACK: 34.0000 g | PACK | Freq: Two times a day (BID) | ORAL | Status: DC

## 2018-02-20 MED ORDER — MAGNESIUM CITRATE PO SOLN: 1.0000 | Freq: Two times a day (BID) | ORAL | Status: DC

## 2018-02-20 MED ORDER — SENNOSIDES-DOCUSATE SODIUM 8.6-50 MG PO TABS
2.0000 | ORAL_TABLET | Freq: Two times a day (BID) | ORAL | Status: DC
  Administered 2018-02-20 – 2018-02-23 (×8): 2 via ORAL
  Filled 2018-02-20 (×8): qty 2

## 2018-02-20 NOTE — H&P (View-Only) (Signed)
Consultation  Referring Provider: [facility] /Dr Michaell Cowing Primary Care Physician:  Marthenia Rolling, DO Primary Gastroenterologist:  none  Reason for Consultation:  Colonoscopy  HPI: Stephanie [name] is a 37 y.o. female who we are asked to see by [facility] surgery for colonoscopy.  She has history of hypertension, anxiety, depression, PTSD, and schizophrenia by previous records.  She has had problems with recurrent perirectal abscesses.  She was seen in 2016 Dr. Gerrit Friends and had I&D of a horseshoe perirectal abscess.  Since that time she has had 3 separate percutaneous drainages of recurrent fluid collections.  The last IR drainage was done in November 2018 and she had follow-up MRI about a week later which showed a complex fluid collection in the left supra levator ischio rectal fossa measuring 3.1 x 2.3 x 1.8 cm.  This was not felt to have changed significantly since the IR drainage.  Patient states the catheter was left in for about a month, she was concerned because she was not having it flushed. She had attempted colonoscopy by Dr. Angelena Form in November 2018 which was aborted due to large amount of stool in the sigmoid. She had reattempt at sigmoidoscopy in January 2019 per Dr. Cliffton Asters.  Report does not mention any rectal abnormality but there was a large amount of stool in the proximal sigmoid. Patient says she has had significant problems with constipation over the past several years.  She uses mag citrate as needed at home and also MiraLAX.  She says her primary physician had just put her on Amitiza, but she is not taking that regularly as yet. Says she started having recurrent rectal pain several days ago and says that she can generally tell when the perirectal abscess is recurring because she is unable to have a bowel movement and then develops deep rectal and back pain.  She is been unaware of any fevers but says she does sweat frequently.  She has not been passing any purulent material from her  rectum.  She has seen some blood since yesterday but says that her period has also started. She has not had any prior GI evaluation. She was readmitted last evening, CT of the abdomen and pelvis was done which shows a large complex fluid collection measuring 7.7 x 6.2 x 7.1 cm adjacent to the distal rectum on the left enlarged compared to prior MRI and causing mass-effect on the rectum with displacement.  There is no mention of any other colonic or small bowel abnormality. WBC 14.6, hemoglobin 11.1, hematocrit of 34.8, MCV of 103 lately is 117.   She has been started on Zosyn.  Patient did grow staph and diphtheroids from  fluid collection drained in November 2018.  Patient has just completed a bottle of mag citrate and says she has had 2-3 large bowel movements.   Past Medical History:  Diagnosis Date  . Anemia   . Depression   . Diabetes mellitus    gestational only  . H/O cesarean section 2010  . Headache    migraines  . Hemorrhoids   . Hypertension   . Panic attacks   . Schizophrenia (HCC)   . Vaginal delivery 2004 , 2008, 2009    Past Surgical History:  Procedure Laterality Date  . CESAREAN SECTION    . COLONOSCOPY WITH PROPOFOL  08/19/2017   Procedure: COLONOSCOPY WITH PROPOFOL;  Surgeon: Andria Meuse, MD;  [facility];  Service: General;;  . FLEXIBLE SIGMOIDOSCOPY  09/25/2017   Procedure:  FLEXIBLE SIGMOIDOSCOPY;  Surgeon: Andria Meuse, MD;  [facility]: [facility];  Service: General;;  . i and d perirectal abcess  2016  . INCISION AND DRAINAGE PERIRECTAL ABSCESS N/A 05/28/2015   Procedure: IRRIGATION AND DEBRIDEMENT PERIRECTAL ABSCESS;  Surgeon: Darnell Level, MD;  Location: [facility];  Service: General;  Laterality: N/A;  . IR RADIOLOGIST EVAL & MGMT  04/02/2017  . TUBAL LIGATION  2010    Prior to Admission medications   Medication Sig Start Date End Date Taking? Authorizing Provider  amLODipine (NORVASC) 5 MG tablet TK 1 T PO EACH DAY 02/03/18  Yes  [provider]  Aspirin-Salicylamide-Caffeine (BC HEADACHE POWDER PO) Take 1 packet daily as needed by mouth (headaches).   Yes [provider]  celecoxib (CELEBREX) 200 MG capsule Take 200 mg by mouth daily. 01/18/18  Yes [provider]  cyclobenzaprine (FLEXERIL) 10 MG tablet TK ONE T PO D PRN FOR SPASMS 12/24/17  Yes [provider]  meloxicam (MOBIC) 15 MG tablet Take 15 mg by mouth daily as needed (for pain/inflammation.).   Yes [provider]  SUMAtriptan (IMITREX) 25 MG tablet Take 25 mg every 2 (two) hours as needed by mouth for migraine. 07/15/17   [provider]    Current Facility-Administered Medications  Medication Dose Route Frequency Provider Last Rate Last Dose  . acetaminophen (TYLENOL) tablet 1,000 mg  1,000 mg Oral Q8H SimaanFrancine Graven, PA-C   1,000 mg at 02/20/18 1413  . amLODipine (NORVASC) tablet 5 mg  5 mg Oral Daily Adam Phenix, PA-C   5 mg at 02/20/18 1105  . dextrose 5 % and 0.45 % NaCl with KCl 20 mEq/L infusion   Intravenous Continuous Adam Phenix, PA-C 75 mL/hr at 02/19/18 1840    . diphenhydrAMINE (BENADRYL) capsule 25 mg  25 mg Oral Q6H PRN Adam Phenix, PA-C       Or  . diphenhydrAMINE (BENADRYL) injection 25 mg  25 mg Intravenous Q6H PRN Simaan, Elizabeth S, PA-C      . enoxaparin (LOVENOX) injection 40 mg  40 mg Subcutaneous Q24H Adam Phenix, PA-C   40 mg at 02/19/18 2142  . hydrALAZINE (APRESOLINE) injection 10 mg  10 mg Intravenous Q2H PRN Adam Phenix, PA-C      . ibuprofen (ADVIL,MOTRIN) tablet 600 mg  600 mg Oral Q8H Adam Phenix, PA-C   600 mg at 02/20/18 1413  . magnesium citrate solution 1 Bottle  1 Bottle Oral BID Karie Soda, MD      . morphine 2 MG/ML injection 1 mg  1 mg Intravenous Q2H PRN Adam Phenix, PA-C   1 mg at 02/20/18 9604  . ondansetron (ZOFRAN-ODT) disintegrating tablet 4 mg  4 mg Oral Q6H PRN Adam Phenix, PA-C       Or   . ondansetron [facility]) injection 4 mg  4 mg Intravenous Q6H PRN Adam Phenix, PA-C      . oxyCODONE (Oxy IR/ROXICODONE) immediate release tablet 5-10 mg  5-10 mg Oral Q4H PRN Adam Phenix, PA-C   10 mg at 02/20/18 0551  . piperacillin-tazobactam (ZOSYN) IVPB 3.375 g  3.375 g Intravenous Q8H Aleda Grana, RPH 12.5 mL/hr at 02/20/18 1412 3.375 g at 02/20/18 1412  . senna-docusate (Senokot-S) tablet 2 tablet  2 tablet Oral BID Karie Soda, MD   2 tablet at 02/20/18 1106    Allergies as of 02/19/2018  . (No Known Allergies)    Family History  Problem Relation Age of Onset  . Hypertension Mother   . Hypertension Other     Social History   Socioeconomic History  . Marital status: Single    Spouse name: Not on file  . Number of children: Not on file  . Years of education: Not on file  . Highest education level: Not on file  Occupational History  . Not on file  Social Needs  . Financial resource strain: Not on file  . Food insecurity:    Worry: Not on file    Inability: Not on file  . Transportation needs:    Medical: Not on file    Non-medical: Not on file  Tobacco Use  . Smoking status: Current Every Day Smoker    Packs/day: 0.50    Types: Cigarettes  . Smokeless tobacco: Never Used  Substance and Sexual Activity  . Alcohol use: Yes    Comment: ocassionally  . Drug use: Yes    Frequency: 3.0 times per week    Types: Marijuana    Comment: occasionally  . Sexual activity: Yes  Lifestyle  . Physical activity:    Days per week: Not on file    Minutes per session: Not on file  . Stress: Not on file  Relationships  . Social connections:    Talks on phone: Not on file    Gets together: Not on file    Attends religious service: Not on file    Active member of club or organization: Not on file    Attends meetings of clubs or organizations: Not on file    Relationship status: Not on file  . Intimate partner violence:    Fear of current or ex  partner: Not on file    Emotionally abused: Not on file    Physically abused: Not on file    Forced sexual activity: Not on file  Other Topics Concern  . Not on file  Social History Narrative  . Not on file    Review of Systems: Pertinent positive and negative review of systems were noted in the above HPI section.  All other review of systems was otherwise negative.  Physical Exam: Vital signs in last 24 hours: Temp:  [97.8 F (36.6 C)-100.4 F (38 C)] 99.1 F (37.3 C) (05/30 1039) Pulse Rate:  [78-97] 78 (05/30 1039) Resp:  [12-18] 18 (05/30 1039) BP: (113-149)/(73-92) 113/74 (05/30 1039) SpO2:  [98 %-100 %] 99 % (05/30 1039) Last BM Date: 02/19/18 General:   Alert,  Well-developed, well-nourished, pleasant and cooperative in NAD Head:  Normocephalic and atraumatic. Eyes:  Sclera clear, no icterus.   Conjunctiva pink. Ears:  Normal auditory acuity. Nose:  No deformity, discharge,  or lesions. Mouth:  No deformity or lesions.   Neck:  Supple; no masses or thyromegaly. Lungs:  Clear throughout to auscultation.   No wheezes, crackles, or rhonchi. Heart:  Regular rate and rhythm; no murmurs, clicks, rubs,  or gallops. Abdomen:  Soft,nontender, BS active,nonpalp mass or hsm.   Rectal:  Deferred  Msk:  Symmetrical without gross deformities. . Pulses:  Normal pulses noted. Extremities:  Without clubbing or edema. Neurologic:  Alert and  oriented x4;  grossly normal neurologically. Skin:  Intact without significant lesions or rashes.. Psych:  Alert and cooperative. Normal mood and affect.  Intake/Output from previous day: 05/29 0701 - 05/30 0700 In: 1650 [I.V.:550; IV Piggyback:1100] Out: -  Intake/Output this shift: Total I/O In: 120 [P.O.:120] Out: -   Lab Results: Recent Labs  02/19/18 1149 02/20/18 0506  WBC 13.6* 14.6*  HGB 12.4 11.1*  HCT 38.0 34.8*  PLT 128* 117*   BMET Recent Labs    02/19/18 1149 02/20/18 0506  NA 141 136  K 3.4* 3.8  CL 105 105    CO2 24 24  GLUCOSE 109* 107*  BUN 19 11  CREATININE 0.55 0.47  CALCIUM 8.6* 8.2*   LFT Recent Labs    02/19/18 1149  PROT 7.6  ALBUMIN 4.2  AST 15  ALT 12*  ALKPHOS 52  BILITOT 0.5   PT/INR No results for input(s): LABPROT, INR in the last 72 hours. Hepatitis Panel No results for input(s): HEPBSAG, HCVAB, HEPAIGM, HEPBIGM in the last 72 hours.    IMPRESSION:   #38 37 year old African-American female with recurrent perirectal abscesses and She was first treated in [place] in 2016 with a I&D of a horseshoe perirectal abscess.  She has had at least 3 recurrences since and presents again yesterday with rectal pain and low back pain and inability to have a bowel movement over the past few days. CT imaging shows a large complex fluid collection adjacent to the distal rectum on the left causing some mass-effect on the rectum with displacement.  No CT findings consistent with IBD, though IBD should be ruled out.  #2 constipation #3.  Anxiety #4.  History of PTSD #5.  History of schizophrenia  Plan;Continue clear liquid diet this evening, n.p.o. after midnight Patient is scheduled for colonoscopy with Dr. Yancey Flemings tomorrow morning.  Patient has completed a bottle of mag citrate.  Carefully discussed with her that she still needs to complete an entire bowel prep in order to have adequate visualization at the time of colonoscopy.  She expresses understanding and is agreeable to proceed. Check sed rate Continue IV Zosyn Expect patient will need surgical drainage/excision of this persistent complex fluid collection.   Amy Esterwood  02/20/2018, 2:22 PM   GI ATTENDING  History, laboratories, x-rays, prior colonoscopy reports with Dr. Cliffton Asters reviewed. Patient seen and examined. Agree with comprehensive consultation note as outlined above. Preoperative colonoscopy recommended. Hopefully she will prep well enough to provide meaningful information.The nature of the procedure, as well  as the risks, benefits, and alternatives were carefully and thoroughly reviewed with the patient. Ample time for discussion and questions allowed. The patient understood, was satisfied, and agreed to proceed.  Wilhemina Bonito. Eda Keys., M.D. [facility] Division of Gastroenterology

## 2018-02-20 NOTE — Consult Note (Addendum)
  Consultation  Referring Provider: [facility] /Dr Gross Primary Care Physician:  Bland, Scott, DO Primary Gastroenterologist:  none  Reason for Consultation:  Colonoscopy  HPI: Stephanie [name] is a 37 y.o. female who we are asked to see by [facility] for colonoscopy.  She has history of hypertension, anxiety, depression, PTSD, and schizophrenia by previous records.  She has had problems with recurrent perirectal abscesses.  She was seen in 2016 Dr. Gerkin and had I&D of a horseshoe perirectal abscess.  Since that time she has had 3 separate percutaneous drainages of recurrent fluid collections.  The last IR drainage was done in November 2018 and she had follow-up MRI about a week later which showed a complex fluid collection in the left supra levator ischio rectal fossa measuring 3.1 x 2.3 x 1.8 cm.  This was not felt to have changed significantly since the IR drainage.  Patient states the catheter was left in for about a month, she was concerned because she was not having it flushed. She had attempted colonoscopy by Dr. Chris White in November 2018 which was aborted due to large amount of stool in the sigmoid. She had reattempt at sigmoidoscopy in January 2019 per Dr. White.  Report does not mention any rectal abnormality but there was a large amount of stool in the proximal sigmoid. Patient says she has had significant problems with constipation over the past several years.  She uses mag citrate as needed at home and also MiraLAX.  She says her primary physician had just put her on Amitiza, but she is not taking that regularly as yet. Says she started having recurrent rectal pain several days ago and says that she can generally tell when the perirectal abscess is recurring because she is unable to have a bowel movement and then develops deep rectal and back pain.  She is been unaware of any fevers but says she does sweat frequently.  She has not been passing any purulent material from her  rectum.  She has seen some blood since yesterday but says that her period has also started. She has not had any prior GI evaluation. She was readmitted last evening, CT of the abdomen and pelvis was done which shows a large complex fluid collection measuring 7.7 x 6.2 x 7.1 cm adjacent to the distal rectum on the left enlarged compared to prior MRI and causing mass-effect on the rectum with displacement.  There is no mention of any other colonic or small bowel abnormality. WBC 14.6, hemoglobin 11.1, hematocrit of 34.8, MCV of 103 lately is 117.   She has been started on Zosyn.  Patient did grow staph and diphtheroids from  fluid collection drained in November 2018.  Patient has just completed a bottle of mag citrate and says she has had 2-3 large bowel movements.   Past Medical History:  Diagnosis Date  . Anemia   . Depression   . Diabetes mellitus    gestational only  . H/O cesarean section 2010  . Headache    migraines  . Hemorrhoids   . Hypertension   . Panic attacks   . Schizophrenia (HCC)   . Vaginal delivery 2004 , 2008, 2009    Past Surgical History:  Procedure Laterality Date  . CESAREAN SECTION    . COLONOSCOPY WITH PROPOFOL  08/19/2017   Procedure: COLONOSCOPY WITH PROPOFOL;  Surgeon: White, Christopher M, MD;  Location: [facility];  Service: General;;  . FLEXIBLE SIGMOIDOSCOPY  09/25/2017   Procedure:   FLEXIBLE SIGMOIDOSCOPY;  Surgeon: White, Christopher M, MD;  Location: [facility];  Service: General;;  . i and d perirectal abcess  2016  . INCISION AND DRAINAGE PERIRECTAL ABSCESS N/A 05/28/2015   Procedure: IRRIGATION AND DEBRIDEMENT PERIRECTAL ABSCESS;  Surgeon: Todd Gerkin, MD;  Location: [facility];  Service: General;  Laterality: N/A;  . IR RADIOLOGIST EVAL & MGMT  04/02/2017  . TUBAL LIGATION  2010    Prior to Admission medications   Medication Sig Start Date End Date Taking? Authorizing Provider  amLODipine (NORVASC) 5 MG tablet TK 1 T PO EACH DAY 02/03/18  Yes  [provider]  Aspirin-Salicylamide-Caffeine (BC HEADACHE POWDER PO) Take 1 packet daily as needed by mouth (headaches).   Yes [provider]  celecoxib (CELEBREX) 200 MG capsule Take 200 mg by mouth daily. 01/18/18  Yes [provider]  cyclobenzaprine (FLEXERIL) 10 MG tablet TK ONE T PO D PRN FOR SPASMS 12/24/17  Yes [provider]  meloxicam (MOBIC) 15 MG tablet Take 15 mg by mouth daily as needed (for pain/inflammation.).   Yes [provider]  SUMAtriptan (IMITREX) 25 MG tablet Take 25 mg every 2 (two) hours as needed by mouth for migraine. 07/15/17   [provider]    Current Facility-Administered Medications  Medication Dose Route Frequency Provider Last Rate Last Dose  . acetaminophen (TYLENOL) tablet 1,000 mg  1,000 mg Oral Q8H Simaan, Elizabeth S, PA-C   1,000 mg at 02/20/18 1413  . amLODipine (NORVASC) tablet 5 mg  5 mg Oral Daily Simaan, Elizabeth S, PA-C   5 mg at 02/20/18 1105  . dextrose 5 % and 0.45 % NaCl with KCl 20 mEq/L infusion   Intravenous Continuous Simaan, Elizabeth S, PA-C 75 mL/hr at 02/19/18 1840    . diphenhydrAMINE (BENADRYL) capsule 25 mg  25 mg Oral Q6H PRN Simaan, Elizabeth S, PA-C       Or  . diphenhydrAMINE (BENADRYL) injection 25 mg  25 mg Intravenous Q6H PRN Simaan, Elizabeth S, PA-C      . enoxaparin (LOVENOX) injection 40 mg  40 mg Subcutaneous Q24H Simaan, Elizabeth S, PA-C   40 mg at 02/19/18 2142  . hydrALAZINE (APRESOLINE) injection 10 mg  10 mg Intravenous Q2H PRN Simaan, Elizabeth S, PA-C      . ibuprofen (ADVIL,MOTRIN) tablet 600 mg  600 mg Oral Q8H Simaan, Elizabeth S, PA-C   600 mg at 02/20/18 1413  . magnesium citrate solution 1 Bottle  1 Bottle Oral BID Gross, Steven, MD      . morphine 2 MG/ML injection 1 mg  1 mg Intravenous Q2H PRN Simaan, Elizabeth S, PA-C   1 mg at 02/20/18 0616  . ondansetron (ZOFRAN-ODT) disintegrating tablet 4 mg  4 mg Oral Q6H PRN Simaan, Elizabeth S, PA-C       Or   . ondansetron (ZOFRAN) injection 4 mg  4 mg Intravenous Q6H PRN Simaan, Elizabeth S, PA-C      . oxyCODONE (Oxy IR/ROXICODONE) immediate release tablet 5-10 mg  5-10 mg Oral Q4H PRN Simaan, Elizabeth S, PA-C   10 mg at 02/20/18 0551  . piperacillin-tazobactam (ZOSYN) IVPB 3.375 g  3.375 g Intravenous Q8H Zeigler, Dustin G, RPH 12.5 mL/hr at 02/20/18 1412 3.375 g at 02/20/18 1412  . senna-docusate (Senokot-S) tablet 2 tablet  2 tablet Oral BID Gross, Steven, MD   2 tablet at 02/20/18 1106    Allergies as of 02/19/2018  . (No Known Allergies)    Family History    Problem Relation Age of Onset  . Hypertension Mother   . Hypertension Other     Social History   Socioeconomic History  . Marital status: Single    Spouse name: Not on file  . Number of children: Not on file  . Years of education: Not on file  . Highest education level: Not on file  Occupational History  . Not on file  Social Needs  . Financial resource strain: Not on file  . Food insecurity:    Worry: Not on file    Inability: Not on file  . Transportation needs:    Medical: Not on file    Non-medical: Not on file  Tobacco Use  . Smoking status: Current Every Day Smoker    Packs/day: 0.50    Types: Cigarettes  . Smokeless tobacco: Never Used  Substance and Sexual Activity  . Alcohol use: Yes    Comment: ocassionally  . Drug use: Yes    Frequency: 3.0 times per week    Types: Marijuana    Comment: occasionally  . Sexual activity: Yes  Lifestyle  . Physical activity:    Days per week: Not on file    Minutes per session: Not on file  . Stress: Not on file  Relationships  . Social connections:    Talks on phone: Not on file    Gets together: Not on file    Attends religious service: Not on file    Active member of club or organization: Not on file    Attends meetings of clubs or organizations: Not on file    Relationship status: Not on file  . Intimate partner violence:    Fear of current or ex  partner: Not on file    Emotionally abused: Not on file    Physically abused: Not on file    Forced sexual activity: Not on file  Other Topics Concern  . Not on file  Social History Narrative  . Not on file    Review of Systems: Pertinent positive and negative review of systems were noted in the above HPI section.  All other review of systems was otherwise negative.  Physical Exam: Vital signs in last 24 hours: Temp:  [97.8 F (36.6 C)-100.4 F (38 C)] 99.1 F (37.3 C) (05/30 1039) Pulse Rate:  [78-97] 78 (05/30 1039) Resp:  [12-18] 18 (05/30 1039) BP: (113-149)/(73-92) 113/74 (05/30 1039) SpO2:  [98 %-100 %] 99 % (05/30 1039) Last BM Date: 02/19/18 General:   Alert,  Well-developed, well-nourished, pleasant and cooperative in NAD Head:  Normocephalic and atraumatic. Eyes:  Sclera clear, no icterus.   Conjunctiva pink. Ears:  Normal auditory acuity. Nose:  No deformity, discharge,  or lesions. Mouth:  No deformity or lesions.   Neck:  Supple; no masses or thyromegaly. Lungs:  Clear throughout to auscultation.   No wheezes, crackles, or rhonchi. Heart:  Regular rate and rhythm; no murmurs, clicks, rubs,  or gallops. Abdomen:  Soft,nontender, BS active,nonpalp mass or hsm.   Rectal:  Deferred  Msk:  Symmetrical without gross deformities. . Pulses:  Normal pulses noted. Extremities:  Without clubbing or edema. Neurologic:  Alert and  oriented x4;  grossly normal neurologically. Skin:  Intact without significant lesions or rashes.. Psych:  Alert and cooperative. Normal mood and affect.  Intake/Output from previous day: 05/29 0701 - 05/30 0700 In: 1650 [I.V.:550; IV Piggyback:1100] Out: -  Intake/Output this shift: Total I/O In: 120 [P.O.:120] Out: -   Lab Results: Recent Labs      02/19/18 1149 02/20/18 0506  WBC 13.6* 14.6*  HGB 12.4 11.1*  HCT 38.0 34.8*  PLT 128* 117*   BMET Recent Labs    02/19/18 1149 02/20/18 0506  NA 141 136  K 3.4* 3.8  CL 105 105    CO2 24 24  GLUCOSE 109* 107*  BUN 19 11  CREATININE 0.55 0.47  CALCIUM 8.6* 8.2*   LFT Recent Labs    02/19/18 1149  PROT 7.6  ALBUMIN 4.2  AST 15  ALT 12*  ALKPHOS 52  BILITOT 0.5   PT/INR No results for input(s): LABPROT, INR in the last 72 hours. Hepatitis Panel No results for input(s): HEPBSAG, HCVAB, HEPAIGM, HEPBIGM in the last 72 hours.    IMPRESSION:   #1 36-year-old African-American female with recurrent perirectal abscesses and She was first treated in [place] in 2016 with a I&D of a horseshoe perirectal abscess.  She has had at least 3 recurrences since and presents again yesterday with rectal pain and low back pain and inability to have a bowel movement over the past few days. CT imaging shows a large complex fluid collection adjacent to the distal rectum on the left causing some mass-effect on the rectum with displacement.  No CT findings consistent with IBD, though IBD should be ruled out.  #2 constipation #3.  Anxiety #4.  History of PTSD #5.  History of schizophrenia  Plan;Continue clear liquid diet this evening, n.p.o. after midnight Patient is scheduled for colonoscopy with Dr. Demani Weyrauch tomorrow morning.  Patient has completed a bottle of mag citrate.  Carefully discussed with her that she still needs to complete an entire bowel prep in order to have adequate visualization at the time of colonoscopy.  She expresses understanding and is agreeable to proceed. Check sed rate Continue IV Zosyn Expect patient will need surgical drainage/excision of this persistent complex fluid collection.   Amy Esterwood  02/20/2018, 2:22 PM   GI ATTENDING  History, laboratories, x-rays, prior colonoscopy reports with Dr. White reviewed. Patient seen and examined. Agree with comprehensive consultation note as outlined above. Preoperative colonoscopy recommended. Hopefully she will prep well enough to provide meaningful information.The nature of the procedure, as well  as the risks, benefits, and alternatives were carefully and thoroughly reviewed with the patient. Ample time for discussion and questions allowed. The patient understood, was satisfied, and agreed to proceed.  Roshelle Traub N. Nima Bamburg, Jr., M.D. [facility] Division of Gastroenterology   

## 2018-02-20 NOTE — Progress Notes (Signed)
Patient ID: Stephanie [name], female   DOB: [birth year], 37 y.o.   MRN: [id number]       Subjective: Pt overall is about the same.  No new complaints.  Doesn't want to prep with miralax as she states it doesn't work for her.  Objective: Vital signs in last 24 hours: Temp:  [97.8 F (36.6 C)-100.4 F (38 C)] 97.9 F (36.6 C) (05/30 0543) Pulse Rate:  [85-97] 87 (05/30 0543) Resp:  [12-18] 14 (05/30 0543) BP: (120-149)/(73-92) 130/92 (05/30 0543) SpO2:  [98 %-100 %] 100 % (05/30 0543) Last BM Date: 02/19/18  Intake/Output from previous day: 05/29 0701 - 05/30 0700 In: 1650 [I.V.:550; IV Piggyback:1100] Out: -  Intake/Output this shift: No intake/output data recorded.  PE: Heart: regular Lungs: CTAB Abd: soft, NT, ND  Lab Results:  Recent Labs    02/19/18 1149 02/20/18 0506  WBC 13.6* 14.6*  HGB 12.4 11.1*  HCT 38.0 34.8*  PLT 128* 117*   BMET Recent Labs    02/19/18 1149 02/20/18 0506  NA 141 136  K 3.4* 3.8  CL 105 105  CO2 24 24  GLUCOSE 109* 107*  BUN 19 11  CREATININE 0.55 0.47  CALCIUM 8.6* 8.2*   PT/INR No results for input(s): LABPROT, INR in the last 72 hours. CMP     Component Value Date/Time   NA 136 02/20/2018 0506   K 3.8 02/20/2018 0506   CL 105 02/20/2018 0506   CO2 24 02/20/2018 0506   GLUCOSE 107 (H) 02/20/2018 0506   BUN 11 02/20/2018 0506   CREATININE 0.47 02/20/2018 0506   CALCIUM 8.2 (L) 02/20/2018 0506   PROT 7.6 02/19/2018 1149   ALBUMIN 4.2 02/19/2018 1149   AST 15 02/19/2018 1149   ALT 12 (L) 02/19/2018 1149   ALKPHOS 52 02/19/2018 1149   BILITOT 0.5 02/19/2018 1149   GFRNONAA >60 02/20/2018 0506   GFRAA >60 02/20/2018 0506   Lipase     Component Value Date/Time   LIPASE 81 (H) 02/19/2018 1149       Studies/Results: Ct Abdomen Pelvis W Contrast  Result Date: 02/19/2018 CLINICAL DATA:  Constipation and rectal pain, history of previous rectal abscess EXAM: CT ABDOMEN AND PELVIS WITH CONTRAST TECHNIQUE:  Multidetector CT imaging of the abdomen and pelvis was performed using the standard protocol following bolus administration of intravenous contrast. CONTRAST:  ISOVUE-300 IOPAMIDOL (ISOVUE-300) INJECTION 61% COMPARISON:  07/16/2017, 08/19/2017 FINDINGS: Lower chest: No acute abnormality. Hepatobiliary: No focal liver abnormality is seen. No gallstones, gallbladder wall thickening, or biliary dilatation. Pancreas: Unremarkable. No pancreatic ductal dilatation or surrounding inflammatory changes. Spleen: Normal in size without focal abnormality. Adrenals/Urinary Tract: Adrenal glands are unremarkable. Kidneys are normal, without renal calculi, focal lesion, or hydronephrosis. Bladder is unremarkable. Stomach/Bowel: Adjacent to the distal aspect of the rectum on the left, there is a large complex appearing fluid collection measuring 7.7 x 6.2 x 7.1 cm in greatest dimension. This is significantly enlarged when compared with the prior MRI examination. This causes significant mass effect upon the adjacent rectum with displacement to the right. The previously seen enhancing nodules anterior to the distal sacrum are again seen and stable. Additionally the rectosigmoid is filled with fluid likely related to difficulty passing stool. No other obstructive changes are seen. Vascular/Lymphatic: No significant vascular findings are present. No enlarged abdominal or pelvic lymph nodes. Reproductive: Uterus and bilateral adnexa are unremarkable. Other: No free fluid is noted.  No definitive hernia is identified. Musculoskeletal: No acute or  significant osseous findings. IMPRESSION: Large complex cystic collection identified in the left perirectal region increased in size when compared with the prior exam. This causes mass effect upon the adjacent rectum and some backup of fluid and stool within the distal colon. The adjacent enhancing nodules anterior to the sacrum are again seen and stable. No other focal abnormality is  noted. Electronically Signed   By: Alcide Clever M.D.   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 -Dr. Michaell Cowing will further discuss options and plans with her.  FEN - NPO for now ?clears VTE - SCDs/Lovenox ID - Zosyn   LOS: 0 days    Letha Cape , [facility] 02/20/2018, 9:43 AM Pager: [phone]

## 2018-02-21 ENCOUNTER — Encounter (HOSPITAL_COMMUNITY): Payer: Self-pay | Admitting: Internal Medicine

## 2018-02-21 ENCOUNTER — Encounter (HOSPITAL_COMMUNITY): Admission: EM | Disposition: A | Discharge status: Home / Self Care | Payer: Self-pay | Source: Home / Self Care

## 2018-02-21 ENCOUNTER — Inpatient Hospital Stay (HOSPITAL_COMMUNITY): Payer: Medicaid Other | Admitting: Certified Registered Nurse Anesthetist

## 2018-02-21 DIAGNOSIS — F4310 Post-traumatic stress disorder, unspecified: Secondary | ICD-10-CM | POA: Diagnosis present

## 2018-02-21 DIAGNOSIS — F209 Schizophrenia, unspecified: Secondary | ICD-10-CM | POA: Diagnosis present

## 2018-02-21 DIAGNOSIS — R102 Pelvic and perineal pain: Secondary | ICD-10-CM

## 2018-02-21 DIAGNOSIS — F410 Panic disorder [episodic paroxysmal anxiety] without agoraphobia: Secondary | ICD-10-CM | POA: Diagnosis present

## 2018-02-21 DIAGNOSIS — I10 Essential (primary) hypertension: Secondary | ICD-10-CM | POA: Diagnosis present

## 2018-02-21 HISTORY — PX: COLONOSCOPY WITH PROPOFOL: SHX5780

## 2018-02-21 SURGERY — COLONOSCOPY WITH PROPOFOL
Anesthesia: Monitor Anesthesia Care

## 2018-02-21 MED ORDER — PROPOFOL 10 MG/ML IV BOLUS
INTRAVENOUS | Status: AC
  Filled 2018-02-21: qty 20

## 2018-02-21 MED ORDER — PROPOFOL 500 MG/50ML IV EMUL
INTRAVENOUS | Status: DC | PRN
  Administered 2018-02-21: 200 ug/kg/min via INTRAVENOUS

## 2018-02-21 MED ORDER — LACTATED RINGERS IV SOLN
INTRAVENOUS | Status: DC | PRN
  Administered 2018-02-21: 10:00:00 via INTRAVENOUS

## 2018-02-21 MED ORDER — HYDROCORTISONE 2.5 % RE CREA
TOPICAL_CREAM | Freq: Two times a day (BID) | RECTAL | Status: DC
  Administered 2018-02-21: 1 via RECTAL
  Administered 2018-02-22: 22:00:00 via RECTAL
  Administered 2018-02-22: 1 via RECTAL
  Administered 2018-02-23 (×2): via RECTAL
  Filled 2018-02-21: qty 28.35

## 2018-02-21 MED ORDER — PROPOFOL 10 MG/ML IV BOLUS
INTRAVENOUS | Status: AC
  Filled 2018-02-21: qty 40

## 2018-02-21 MED ORDER — PROPOFOL 10 MG/ML IV BOLUS
INTRAVENOUS | Status: DC | PRN
  Administered 2018-02-21 (×2): 20 mg via INTRAVENOUS
  Administered 2018-02-21: 30 mg via INTRAVENOUS
  Administered 2018-02-21: 20 mg via INTRAVENOUS
  Administered 2018-02-21: 30 mg via INTRAVENOUS

## 2018-02-21 SURGICAL SUPPLY — 22 items

## 2018-02-21 NOTE — Progress Notes (Signed)
MD Carolynne Edouard was notified that patient was unable to finish bowel prep and there were no new orders given.

## 2018-02-21 NOTE — Anesthesia Postprocedure Evaluation (Signed)
Anesthesia Post Note  Patient: Stephanie [name]  Procedure(s) Performed: COLONOSCOPY WITH PROPOFOL (N/A )     Patient location during evaluation: PACU Anesthesia Type: MAC Level of consciousness: awake and alert Pain management: pain level controlled Vital Signs Assessment: post-procedure vital signs reviewed and stable Respiratory status: spontaneous breathing, nonlabored ventilation, respiratory function stable and patient connected to nasal cannula oxygen Cardiovascular status: stable and blood pressure returned to baseline Postop Assessment: no apparent nausea or vomiting Anesthetic complications: no    Last Vitals:  Vitals:   02/21/18 1028 02/21/18 1030  BP: 103/61 102/87  Pulse: 96 85  Resp: 19 20  Temp: 36.9 C   SpO2: 100% 100%    Last Pain:  Vitals:   02/21/18 1306  TempSrc:   PainSc: 2                  Alazia Crocket

## 2018-02-21 NOTE — Progress Notes (Signed)
Patient ID: Stephanie [name], female   DOB: 05/02/1981, 37 y.o.   MRN: [id number]    Day of Surgery  Subjective: No new complaints today except hemorrhoidal pain after prep.  Objective: Vital signs in last 24 hours: Temp:  [97.7 F (36.5 C)-100.1 F (37.8 C)] 99.2 F (37.3 C) (05/31 0825) Pulse Rate:  [78-99] 99 (05/31 0825) Resp:  [13-18] 18 (05/31 0825) BP: (113-143)/(74-90) 143/79 (05/31 0825) SpO2:  [99 %-100 %] 100 % (05/31 0825) Last BM Date: 02/20/18  Intake/Output from previous day: 05/30 0701 - 05/31 0700 In: 3796 [P.O.:1496; I.V.:2100; IV Piggyback:200] Out: -  Intake/Output this shift: No intake/output data recorded.  PE: Heart: regular Lungs: CTAB Abd: soft, NT, ND  Lab Results:  Recent Labs    02/19/18 1149 02/20/18 0506  WBC 13.6* 14.6*  HGB 12.4 11.1*  HCT 38.0 34.8*  PLT 128* 117*   BMET Recent Labs    02/19/18 1149 02/20/18 0506  NA 141 136  K 3.4* 3.8  CL 105 105  CO2 24 24  GLUCOSE 109* 107*  BUN 19 11  CREATININE 0.55 0.47  CALCIUM 8.6* 8.2*   PT/INR No results for input(s): LABPROT, INR in the last 72 hours. CMP     Component Value Date/Time   NA 136 02/20/2018 0506   K 3.8 02/20/2018 0506   CL 105 02/20/2018 0506   CO2 24 02/20/2018 0506   GLUCOSE 107 (H) 02/20/2018 0506   BUN 11 02/20/2018 0506   CREATININE 0.47 02/20/2018 0506   CALCIUM 8.2 (L) 02/20/2018 0506   PROT 7.6 02/19/2018 1149   ALBUMIN 4.2 02/19/2018 1149   AST 15 02/19/2018 1149   ALT 12 (L) 02/19/2018 1149   ALKPHOS 52 02/19/2018 1149   BILITOT 0.5 02/19/2018 1149   GFRNONAA >60 02/20/2018 0506   GFRAA >60 02/20/2018 0506   Lipase     Component Value Date/Time   LIPASE 81 (H) 02/19/2018 1149       Studies/Results: Ct Abdomen Pelvis W Contrast  Result Date: 02/19/2018 CLINICAL DATA:  Constipation and rectal pain, history of previous rectal abscess EXAM: CT ABDOMEN AND PELVIS WITH CONTRAST TECHNIQUE: Multidetector CT imaging of the abdomen and  pelvis was performed using the standard protocol following bolus administration of intravenous contrast. CONTRAST:  ISOVUE-300 IOPAMIDOL (ISOVUE-300) INJECTION 61% COMPARISON:  07/16/2017, 08/19/2017 FINDINGS: Lower chest: No acute abnormality. Hepatobiliary: No focal liver abnormality is seen. No gallstones, gallbladder wall thickening, or biliary dilatation. Pancreas: Unremarkable. No pancreatic ductal dilatation or surrounding inflammatory changes. Spleen: Normal in size without focal abnormality. Adrenals/Urinary Tract: Adrenal glands are unremarkable. Kidneys are normal, without renal calculi, focal lesion, or hydronephrosis. Bladder is unremarkable. Stomach/Bowel: Adjacent to the distal aspect of the rectum on the left, there is a large complex appearing fluid collection measuring 7.7 x 6.2 x 7.1 cm in greatest dimension. This is significantly enlarged when compared with the prior MRI examination. This causes significant mass effect upon the adjacent rectum with displacement to the right. The previously seen enhancing nodules anterior to the distal sacrum are again seen and stable. Additionally the rectosigmoid is filled with fluid likely related to difficulty passing stool. No other obstructive changes are seen. Vascular/Lymphatic: No significant vascular findings are present. No enlarged abdominal or pelvic lymph nodes. Reproductive: Uterus and bilateral adnexa are unremarkable. Other: No free fluid is noted.  No definitive hernia is identified. Musculoskeletal: No acute or significant osseous findings. IMPRESSION: Large complex cystic collection identified in the left perirectal region  increased in size when compared with the prior exam. This causes mass effect upon the adjacent rectum and some backup of fluid and stool within the distal colon. The adjacent enhancing nodules anterior to the sacrum are again seen and stable. No other focal abnormality is noted. Electronically Signed   By: Alcide Clever  M.D.   On: 02/19/2018 15:22    Anti-infectives: Anti-infectives (From admission, onward)   Start     Dose/Rate Route Frequency Ordered Stop   02/19/18 2200  [MAR Hold]  piperacillin-tazobactam (ZOSYN) IVPB 3.375 g     (MAR Hold since Fri 02/21/2018 at 0825. Reason: Transfer to a Procedural area.)   3.375 g 12.5 mL/hr over 240 Minutes Intravenous Every 8 hours 02/19/18 1741     02/19/18 1600  piperacillin-tazobactam (ZOSYN) IVPB 3.375 g     3.375 g 100 mL/hr over 30 Minutes Intravenous  Once 02/19/18 1548 02/19/18 1707       Assessment/Plan HTN - home meds, PRN hydralazine Anxiety/depression - not currently on medication  Recurrent peri-rectal abscess vsrectrorectalcyst  -c-scope today.  If all appears well, then plan for resection of this area, likely next Tuesday in OR. -Anusol for hemorrhoids  FEN - NPO for now/ advance diet after c-scope VTE - SCDs/Lovenox ID - Zosyn    LOS: 1 day    Letha Cape , [facility] Surgery 02/21/2018, 8:41 AM Pager: [phone]

## 2018-02-21 NOTE — Anesthesia Procedure Notes (Signed)
Procedure Name: MAC Date/Time: 02/21/2018 9:58 AM Performed by: West Pugh, CRNA Pre-anesthesia Checklist: Patient identified, Emergency Drugs available, Suction available, Patient being monitored and Timeout performed Patient Re-evaluated:Patient Re-evaluated prior to induction Oxygen Delivery Method: Nasal cannula Induction Type: IV induction Placement Confirmation: positive ETCO2 Dental Injury: Teeth and Oropharynx as per pre-operative assessment

## 2018-02-21 NOTE — Op Note (Signed)
[facility] Patient Name: Stephanie [name] Procedure Date: 02/21/2018 MRN: [id number] Attending MD: Wilhemina Bonito. Marina Goodell , MD Date of Birth: 06-19-1981 CSN: [id number] Age: 37 Admit Type: Inpatient Procedure:                Colonoscopy Indications:              Abnormal CT of the GI tract Providers:                Wilhemina Bonito. Marina Goodell, MD, Jacquiline Doe, RN, Arlee Muslim                            Tech., Technician, Kym Groom, CRNA Referring MD:             Karie Soda, M.D. Medicines:                Monitored Anesthesia Care Complications:            No immediate complications. Estimated blood loss:                            None. Estimated Blood Loss:     Estimated blood loss: none. Procedure:                Pre-Anesthesia Assessment:                           - Prior to the procedure, a History and Physical                            was performed, and patient medications and                            allergies were reviewed. The patient's tolerance of                            previous anesthesia was also reviewed. The risks                            and benefits of the procedure and the sedation                            options and risks were discussed with the patient.                            All questions were answered, and informed consent                            was obtained. Prior Anticoagulants: The patient has                            taken no previous anticoagulant or antiplatelet                            agents. ASA Grade Assessment: I - A normal, healthy  patient. After reviewing the risks and benefits,                            the patient was deemed in satisfactory condition to                            undergo the procedure.                           After obtaining informed consent, the colonoscope                            was passed under direct vision. Throughout the                            procedure, the  patient's blood pressure, pulse, and                            oxygen saturations were monitored continuously. The                            EC-[id number] ([id number] scope was introduced through                            the anus and advanced to the the cecum, identified                            by appendiceal orifice and ileocecal valve. The                            terminal ileum, ileocecal valve, appendiceal                            orifice, and rectum were photographed. The quality                            of the bowel preparation was excellent. The                            colonoscopy was performed without difficulty. The                            patient tolerated the procedure well. The bowel                            preparation used was MoviPrep. Scope In: 10:10:29 AM Scope Out: 10:20:05 AM Scope Withdrawal Time: 0 hours 7 minutes 2 seconds  Total Procedure Duration: 0 hours 9 minutes 36 seconds  Findings:      The terminal ileum appeared normal.      The exam was otherwise without abnormality on direct and retroflexion       views. Impression:               - The examined portion of the ileum was normal.                           -  The examination was otherwise normal on direct                            and retroflexion views.                           - No specimens collected. Moderate Sedation:      none Recommendation:           - Patient has a contact number available for                            emergencies. The signs and symptoms of potential                            delayed complications were discussed with the                            patient. Return to normal activities tomorrow.                            Written discharge instructions were provided to the                            patient.                           - Resume previous diet.                           - Continue present medications.                           - Return to the care of  general surgery Procedure Code(s):        --- Professional ---                           [phone], Colonoscopy, flexible; diagnostic, including                            collection of specimen(s) by brushing or washing,                            when performed (separate procedure) Diagnosis Code(s):        --- Professional ---                           R93.3, Abnormal findings on diagnostic imaging of                            other parts of digestive tract CPT copyright 2017 American [employer] Association. All rights reserved. The codes documented in this report are preliminary and upon coder review may  be revised to meet current compliance requirements. Wilhemina Bonito. Marina Goodell, MD 02/21/2018 10:49:46 AM This report has been signed electronically. Number of Addenda: 0

## 2018-02-21 NOTE — Progress Notes (Addendum)
Patient started bowel prep for colonoscopy at 1910, she was able to complete first liter of prep. RN mixed second prep at 2250 with water but patient said she is unable to drink second liter.

## 2018-02-21 NOTE — Anesthesia Preprocedure Evaluation (Signed)
Anesthesia Evaluation  Patient identified by MRN, date of birth, ID band Patient awake    Reviewed: Allergy & Precautions, NPO status , Patient's Chart, lab work & pertinent test results  History of Anesthesia Complications Negative for: history of anesthetic complications  Airway Mallampati: I  TM Distance: >3 FB Neck ROM: Full    Dental  (+) Dental Advisory Given   Pulmonary Current Smoker,    breath sounds clear to auscultation       Cardiovascular hypertension, Pt. on medications (-) angina Rhythm:Regular Rate:Normal     Neuro/Psych  Headaches, Anxiety Depression Schizophrenia    GI/Hepatic negative GI ROS, Neg liver ROS,   Endo/Other  negative endocrine ROSdiabetes  Renal/GU negative Renal ROS     Musculoskeletal   Abdominal   Peds  Hematology negative hematology ROS (+)   Anesthesia Other Findings   Reproductive/Obstetrics                             Anesthesia Physical  Anesthesia Plan  ASA: II  Anesthesia Plan: MAC   Post-op Pain Management:    Induction:   PONV Risk Score and Plan: 1 and Treatment may vary due to age or medical condition  Airway Management Planned: Natural Airway and Nasal Cannula  Additional Equipment:   Intra-op Plan:   Post-operative Plan:   Informed Consent: I have reviewed the patients History and Physical, chart, labs and discussed the procedure including the risks, benefits and alternatives for the proposed anesthesia with the patient or authorized representative who has indicated his/her understanding and acceptance.   Dental advisory given  Plan Discussed with: CRNA and Surgeon  Anesthesia Plan Comments: (Plan routine monitors, MAC)        Anesthesia Quick Evaluation

## 2018-02-21 NOTE — Transfer of Care (Signed)
Immediate Anesthesia Transfer of Care Note  Patient: Stephanie [name]  Procedure(s) Performed: COLONOSCOPY WITH PROPOFOL (N/A )  Patient Location: PACU  Anesthesia Type:MAC  Level of Consciousness: awake, alert , oriented and patient cooperative  Airway & Oxygen Therapy: Patient Spontanous Breathing and Patient connected to nasal cannula oxygen  Post-op Assessment: Report given to RN and Post -op Vital signs reviewed and stable  Post vital signs: Reviewed and stable  Last Vitals:  Vitals Value Taken Time  BP 102/87 02/21/2018 10:30 AM  Temp    Pulse 85 02/21/2018 10:30 AM  Resp 20 02/21/2018 10:30 AM  SpO2 100 % 02/21/2018 10:30 AM  Vitals shown include unvalidated device data.  Last Pain:  Vitals:   02/21/18 0825  TempSrc: Oral  PainSc: 8       Patients Stated Pain Goal: 2 (41/28/78 6767)  Complications: No apparent anesthesia complications

## 2018-02-21 NOTE — Interval H&P Note (Signed)
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 After consideration of risks, benefits and other options for treatment, the patient has consented to  Procedure(s): COLONOSCOPY WITH PROPOFOL (N/A) as a surgical intervention .  The patient's history has been reviewed, patient examined, no change in status, stable for surgery.  I have reviewed the patient's chart and labs.  Questions were answered to the patient's satisfaction.     Yancey FlemingsJohn Zahra Peffley

## 2018-02-22 DIAGNOSIS — K6289 Other specified diseases of anus and rectum: Secondary | ICD-10-CM

## 2018-02-22 DIAGNOSIS — F419 Anxiety disorder, unspecified: Secondary | ICD-10-CM

## 2018-02-22 DIAGNOSIS — K5900 Constipation, unspecified: Secondary | ICD-10-CM

## 2018-02-22 DIAGNOSIS — F410 Panic disorder [episodic paroxysmal anxiety] without agoraphobia: Secondary | ICD-10-CM

## 2018-02-22 MED ORDER — CITALOPRAM HYDROBROMIDE 20 MG PO TABS
10.0000 mg | ORAL_TABLET | Freq: Every day | ORAL | Status: DC
  Administered 2018-02-22 – 2018-02-28 (×7): 10 mg via ORAL
  Filled 2018-02-22 (×7): qty 1

## 2018-02-22 MED ORDER — HYDROXYZINE HCL 10 MG PO TABS
10.0000 mg | ORAL_TABLET | Freq: Two times a day (BID) | ORAL | Status: DC
  Administered 2018-02-22 – 2018-02-28 (×13): 10 mg via ORAL
  Filled 2018-02-22 (×13): qty 1

## 2018-02-22 NOTE — Progress Notes (Signed)
1 Day Post-Op   Subjective/Chief Complaint: RECTAL CYST No complaints    Objective: Vital signs in last 24 hours: Temp:  [97.9 F (36.6 C)-99.1 F (37.3 C)] 97.9 F (36.6 C) (06/01 0546) Pulse Rate:  [75-105] 75 (06/01 0546) Resp:  [16-20] 16 (06/01 0546) BP: (99-136)/(61-87) 99/74 (06/01 0546) SpO2:  [99 %-100 %] 100 % (06/01 0546) Last BM Date: 02/20/18  Intake/Output from previous day: 05/31 0701 - 06/01 0700 In: 2660 [P.O.:960; I.V.:1600; IV Piggyback:100] Out: 400 [Urine:400] Intake/Output this shift: No intake/output data recorded.  General appearance: alert and cooperative Head: Normocephalic, without obvious abnormality, atraumatic GI: soft, non-tender; bowel sounds normal; no masses,  no organomegaly  Lab Results:  Recent Labs    02/19/18 1149 02/20/18 0506  WBC 13.6* 14.6*  HGB 12.4 11.1*  HCT 38.0 34.8*  PLT 128* 117*   BMET Recent Labs    02/19/18 1149 02/20/18 0506  NA 141 136  K 3.4* 3.8  CL 105 105  CO2 24 24  GLUCOSE 109* 107*  BUN 19 11  CREATININE 0.55 0.47  CALCIUM 8.6* 8.2*   PT/INR No results for input(s): LABPROT, INR in the last 72 hours. ABG No results for input(s): PHART, HCO3 in the last 72 hours.  Invalid input(s): PCO2, PO2  Studies/Results: No results found.  Anti-infectives: Anti-infectives (From admission, onward)   Start     Dose/Rate Route Frequency Ordered Stop   02/19/18 2200  piperacillin-tazobactam (ZOSYN) IVPB 3.375 g     3.375 g 12.5 mL/hr over 240 Minutes Intravenous Every 8 hours 02/19/18 1741     02/19/18 1600  piperacillin-tazobactam (ZOSYN) IVPB 3.375 g     3.375 g 100 mL/hr over 30 Minutes Intravenous  Once 02/19/18 1548 02/19/18 1707      Assessment/Plan: Patient Active Problem List   Diagnosis Date Noted  . Pelvic pain 02/21/2018  . Schizophrenia (HCC)   . Panic attacks   . Hypertension   . PTSD (post-traumatic stress disorder)   . Constipation, chronic 02/20/2018  . Sweaty palms  10/25/2017  . Encounter to establish care 10/25/2017  . Depression 10/25/2017  . 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 Internal hemorrhoids - 3 column 05/01/2013     Plan for robotic assisted resection of retrorectal cyst Tuesday Will need bowel prep Monday DVT prevention    LOS: 2 days    Stephanie [name] 02/22/2018

## 2018-02-22 NOTE — Consult Note (Signed)
[facility] Consult   Reason for Consult: ''panic attacks'' Referring Physician:  Saverio Danker, PA-C Patient Identification: Stephanie [name] MRN:  [id number] Principal Diagnosis: Panic disorder Diagnosis:   Patient Active Problem List   Diagnosis Date Noted  . Panic disorder [F41.0] 02/22/2018  . Pelvic pain [R10.2] 02/21/2018  . Schizophrenia ([place]) [F20.9]   . Panic attacks [F41.0]   . Hypertension [I10]   . PTSD (post-traumatic stress disorder) [F43.10]   . Constipation, chronic [K59.09] 02/20/2018  . Sweaty palms [L74.512] 10/25/2017  . Encounter to establish care [Z76.89] 10/25/2017  . Depression [F32.9] 10/25/2017  . Recurrent presacral pelvic abscess  [N73.9] 03/21/2017  . Internal hemorrhoids - 3 column [K64.8] 05/01/2013    Total Time spent with patient: 1 hour  Subjective:   Stephanie [name] is a 37 y.o. female patient admitted with rectal pain, constipation, emesis  HPI:  Patient with history of internal hemorrhoid, perirectal abscess, pelvic abscess, Anxiety who admitted to the hospital for evaluation of rectal pain. Patient denies history of Schizophrenia, PTSD, Depression and she is wondering while providers keep saying she has all those diagnoses. However, she reports overwhelming stress taking care of her son with disability and stress due to her medical issues. Patient also report recurrent panic attacks , characterized by shortness of breath, hand tremors, sweating, palpitation, smoldering feeling that often last for over 1 hour per episode. Patient denies psychosis, delusions, mood swings, depression or suicidal thoughts.  Past Psychiatric History: Panic disorder  Risk to Self:   Risk to Others:   Prior Inpatient Therapy:   Prior Outpatient Therapy:    Past Medical History:  Past Medical History:  Diagnosis Date  . Anemia   . Depression   . Gestational diabetes mellitus    gestational only  . H/O cesarean section 2010  . Headache    migraines  . Hemorrhoids   . Hypertension   . Panic attacks   . PTSD (post-traumatic stress disorder)   . Schizophrenia ([place])   . Vaginal delivery 2004 , 2008, 2009    Past Surgical History:  Procedure Laterality Date  . CESAREAN SECTION    . COLONOSCOPY WITH PROPOFOL  08/19/2017   Procedure: COLONOSCOPY WITH PROPOFOL;  Surgeon: Ileana Roup, MD;  Location: [facility];  Service: General;;  . FLEXIBLE SIGMOIDOSCOPY  09/25/2017   Procedure: FLEXIBLE SIGMOIDOSCOPY;  Surgeon: Ileana Roup, MD;  Location: [facility];  Service: General;;  . i and d perirectal abcess  2016  . INCISION AND DRAINAGE PERIRECTAL ABSCESS N/A 05/28/2015   Procedure: IRRIGATION AND DEBRIDEMENT PERIRECTAL ABSCESS;  Surgeon: Armandina Gemma, MD;  Location: [facility];  Service: General;  Laterality: N/A;  . IR RADIOLOGIST EVAL & MGMT  04/02/2017  . TUBAL LIGATION  2010   Family History:  Family History  Problem Relation Age of Onset  . Hypertension Mother   . Hypertension Other    Family Psychiatric  History:  Social History:  Social History   Substance and Sexual Activity  Alcohol Use Yes   Comment: ocassionally     Social History   Substance and Sexual Activity  Drug Use Yes  . Frequency: 3.0 times per week  . Types: Marijuana   Comment: occasionally    Social History   Socioeconomic History  . Marital status: Single    Spouse name: Not on file  . Number of children: Not on file  . Years of education: Not on file  . Highest education level: Not on file  Occupational History  . Not on file  Social Needs  . Financial resource strain: Not on file  . Food insecurity:    Worry: Not on file    Inability: Not on file  . Transportation needs:    Medical: Not on file    Non-medical: Not on file  Tobacco Use  . Smoking status: Current Every Day Smoker    Packs/day: 0.50    Types: Cigarettes  . Smokeless tobacco: Never Used  Substance and Sexual Activity  . Alcohol use: Yes     Comment: ocassionally  . Drug use: Yes    Frequency: 3.0 times per week    Types: Marijuana    Comment: occasionally  . Sexual activity: Yes  Lifestyle  . Physical activity:    Days per week: Not on file    Minutes per session: Not on file  . Stress: Not on file  Relationships  . Social connections:    Talks on phone: Not on file    Gets together: Not on file    Attends religious service: Not on file    Active member of club or organization: Not on file    Attends meetings of clubs or organizations: Not on file    Relationship status: Not on file  Other Topics Concern  . Not on file  Social History Narrative  . Not on file   Additional Social History:    Allergies:  No Known Allergies  Labs:  Results for orders placed or performed during the hospital encounter of 02/19/18 (from the past 48 hour(s))  Sedimentation rate     Status: None   Collection Time: 02/20/18  3:44 PM  Result Value Ref Range   Sed Rate 20 0 - 22 mm/hr    Comment: Performed at [facility], [place] [address] [place], [place] [address]    Current Facility-Administered Medications  Medication Dose Route Frequency Provider Last Rate Last Dose  . acetaminophen (TYLENOL) tablet 1,000 mg  1,000 mg Oral Q8H Jill Alexanders, PA-C   1,000 mg at 02/22/18 0539  . amLODipine (NORVASC) tablet 5 mg  5 mg Oral Daily Jill Alexanders, PA-C   5 mg at 02/22/18 0959  . citalopram (CELEXA) tablet 10 mg  10 mg Oral Daily Kendi Defalco, MD      . dextrose 5 % and 0.45 % NaCl with KCl 20 mEq/L infusion   Intravenous Continuous Jill Alexanders, PA-C 75 mL/hr at 02/20/18 2109    . diphenhydrAMINE (BENADRYL) capsule 25 mg  25 mg Oral Q6H PRN Jill Alexanders, PA-C       Or  . diphenhydrAMINE (BENADRYL) injection 25 mg  25 mg Intravenous Q6H PRN Simaan, Elizabeth S, PA-C      . enoxaparin (LOVENOX) injection 40 mg  40 mg Subcutaneous Q24H Esterwood, Amy S, PA-C   40 mg at 02/21/18 2201  .  hydrALAZINE (APRESOLINE) injection 10 mg  10 mg Intravenous Q2H PRN Simaan, Elizabeth S, PA-C      . hydrocortisone (ANUSOL-HC) 2.5 % rectal cream   Rectal BID Saverio Danker, PA-C   1 application at 09/38/18 1003  . hydrOXYzine (ATARAX/VISTARIL) tablet 10 mg  10 mg Oral BID Ryen Rhames, MD      . ibuprofen (ADVIL,MOTRIN) tablet 600 mg  600 mg Oral Q8H Jill Alexanders, PA-C   600 mg at 02/22/18 0539  . morphine 2 MG/ML injection 1 mg  1 mg Intravenous Q2H PRN Jill Alexanders, PA-C  1 mg at 02/21/18 0026  . ondansetron (ZOFRAN-ODT) disintegrating tablet 4 mg  4 mg Oral Q6H PRN Jill Alexanders, PA-C       Or  . ondansetron [facility]) injection 4 mg  4 mg Intravenous Q6H PRN Jill Alexanders, PA-C   4 mg at 02/21/18 6945  . oxyCODONE (Oxy IR/ROXICODONE) immediate release tablet 5-10 mg  5-10 mg Oral Q4H PRN Jill Alexanders, PA-C   5 mg at 02/22/18 0855  . peg 3350 powder (MOVIPREP) kit 100 g  0.5 kit Oral Once Esterwood, Amy S, PA-C      . piperacillin-tazobactam (ZOSYN) IVPB 3.375 g  3.375 g Intravenous Q8H Berton Mount, RPH   Stopped at 02/22/18 0388  . senna-docusate (Senokot-S) tablet 2 tablet  2 tablet Oral BID Michael Boston, MD   2 tablet at 02/22/18 8280    Musculoskeletal: Strength & Muscle Tone: within normal limits Gait & Station: normal Patient leans: N/A  Psychiatric Specialty Exam: Physical Exam  Psychiatric: Her speech is normal and behavior is normal. Judgment and thought content normal. Her mood appears anxious. Cognition and memory are normal.    Review of Systems  Constitutional: Negative.   HENT: Negative.   Eyes: Negative.   Respiratory: Negative.   Cardiovascular: Negative.   Genitourinary: Negative.   Musculoskeletal: Negative.   Skin: Negative.   Neurological: Negative.   Endo/Heme/Allergies: Negative.   Psychiatric/Behavioral: The patient is nervous/anxious.     Blood pressure 122/74, pulse 75, temperature 97.9 F (36.6 C),  temperature source Oral, resp. rate 16, height _0  (1.676 m), weight 68 kg (150 lb), last menstrual period 02/19/2018, SpO2 100 %.Body mass index is 24.21 kg/m.  General Appearance: Casual  Eye Contact:  Good  Speech:  Clear and Coherent  Volume:  Normal  Mood:  Anxious  Affect:  Congruent  Thought Process:  Coherent and Descriptions of Associations: Intact  Orientation:  Full (Time, Place, and Person)  Thought Content:  Logical  Suicidal Thoughts:  No  Homicidal Thoughts:  No  Memory:  Immediate;   Good Recent;   Good Remote;   Good  Judgement:  Intact  Insight:  Fair  Psychomotor Activity:  Normal  Concentration:  Concentration: Fair and Attention Span: Good  Recall:  Good  Fund of Knowledge:  Good  Language:  Good  Akathisia:  No  Handed:  Right  AIMS (if indicated):     Assets:  Communication Skills Desire for Improvement  ADL's:  Intact  Cognition:  WNL  Sleep:   fair     Treatment Plan Summary: Medication management/Plan: -Celexa 10 mg daily for panic disorder -Hydroxyzine 10 mg twice daily for anxiety -There is no acute psychiatric intervention necessary at this point. Patient is cleared by psychiatric service.  Disposition: No evidence of imminent risk to self or others at present.   Patient does not meet criteria for psychiatric inpatient admission. Supportive therapy provided about ongoing stressors. Unit social worker to assist with outpatatient referral to a psychiatrist upon discharge  Corena Pilgrim, MD 02/22/2018 1:01 PM

## 2018-02-23 MED ORDER — CHLORHEXIDINE GLUCONATE CLOTH 2 % EX PADS
6.0000 | MEDICATED_PAD | Freq: Once | CUTANEOUS | Status: AC
  Administered 2018-02-26: 6 via TOPICAL

## 2018-02-23 MED ORDER — CEFOTETAN DISODIUM-DEXTROSE 2-2.08 GM-%(50ML) IV SOLR
2.0000 g | INTRAVENOUS | Status: AC
  Administered 2018-02-26: 2 g via INTRAVENOUS
  Filled 2018-02-23: qty 50

## 2018-02-23 MED ORDER — GABAPENTIN 300 MG PO CAPS: 300.0000 mg | ORAL_CAPSULE | ORAL | Status: DC

## 2018-02-23 MED ORDER — ALVIMOPAN 12 MG PO CAPS
12.0000 mg | ORAL_CAPSULE | ORAL | Status: AC
  Administered 2018-02-26: 12 mg via ORAL
  Filled 2018-02-23: qty 1

## 2018-02-23 MED ORDER — METRONIDAZOLE 500 MG PO TABS
1000.0000 mg | ORAL_TABLET | ORAL | Status: AC
  Administered 2018-02-25 (×3): 1000 mg via ORAL
  Filled 2018-02-23 (×3): qty 2

## 2018-02-23 MED ORDER — SODIUM CHLORIDE 0.9 % IV SOLN
INTRAVENOUS | Status: DC
  Filled 2018-02-23 (×3): qty 6

## 2018-02-23 MED ORDER — NEOMYCIN SULFATE 500 MG PO TABS: 1000.0000 mg | ORAL_TABLET | ORAL | Status: DC

## 2018-02-23 MED ORDER — MAGNESIUM CITRATE PO SOLN
1.0000 | Freq: Two times a day (BID) | ORAL | Status: DC
  Administered 2018-02-25 (×2): 1 via ORAL
  Filled 2018-02-23 (×3): qty 296

## 2018-02-23 MED ORDER — NEOMYCIN SULFATE 500 MG PO TABS
1000.0000 mg | ORAL_TABLET | ORAL | Status: AC
  Administered 2018-02-25 (×3): 1000 mg via ORAL
  Filled 2018-02-23 (×3): qty 2

## 2018-02-23 MED ORDER — METRONIDAZOLE 500 MG PO TABS
1000.0000 mg | ORAL_TABLET | ORAL | Status: DC
  Administered 2018-02-23: 1000 mg via ORAL
  Filled 2018-02-23: qty 2

## 2018-02-23 MED ORDER — CELECOXIB 200 MG PO CAPS: 200.0000 mg | ORAL_CAPSULE | ORAL | Status: DC

## 2018-02-23 MED ORDER — ACETAMINOPHEN 500 MG PO TABS: 1000.0000 mg | ORAL_TABLET | ORAL | Status: DC

## 2018-02-23 MED ORDER — BUPIVACAINE LIPOSOME 1.3 % IJ SUSP
20.0000 mL | Freq: Once | INTRAMUSCULAR | Status: DC
  Filled 2018-02-23: qty 20

## 2018-02-23 MED ORDER — BISACODYL 5 MG PO TBEC
20.0000 mg | DELAYED_RELEASE_TABLET | Freq: Once | ORAL | Status: AC
  Administered 2018-02-25: 20 mg via ORAL
  Filled 2018-02-23: qty 4

## 2018-02-23 MED ORDER — ENOXAPARIN SODIUM 40 MG/0.4ML ~~LOC~~ SOLN
40.0000 mg | Freq: Once | SUBCUTANEOUS | Status: AC
  Administered 2018-02-26: 40 mg via SUBCUTANEOUS
  Filled 2018-02-23: qty 0.4

## 2018-02-23 MED ORDER — CHLORHEXIDINE GLUCONATE CLOTH 2 % EX PADS
6.0000 | MEDICATED_PAD | Freq: Once | CUTANEOUS | Status: AC
  Administered 2018-02-25: 6 via TOPICAL

## 2018-02-23 NOTE — Progress Notes (Signed)
Order obtained via phone from Dr. Luisa Hartornett for Sitz baths PRN. Pt currently taking sitz bath on BSC. Expressed relief.

## 2018-02-23 NOTE — Progress Notes (Signed)
Patient ID: Stephanie [name], female   DOB: Jun 28, 1981, 37 y.o.   MRN: [id number]    2 Days Post-Op  Subjective: Pt overall is about the same.  No new complaints.  Doesn't want to prep with miralax as she states it doesn't work for her. UPSET ABOUT PAIN MEDICATION  Objective: Vital signs in last 24 hours: Temp:  [98.4 F (36.9 C)-98.9 F (37.2 C)] 98.8 F (37.1 C) (06/02 78290632) Pulse Rate:  [77-82] 81 (06/02 0632) Resp:  [15-18] 16 (06/02 56210632) BP: (100-122)/(72-82) 112/72 (06/02 30860632) SpO2:  [99 %-100 %] 99 % (06/02 57840632) Last BM Date: 02/22/18  Intake/Output from previous day: 06/01 0701 - 06/02 0700 In: 2330 [P.O.:480; I.V.:1800; IV Piggyback:50] Out: 300 [Urine:300] Intake/Output this shift: No intake/output data recorded.  PE: Heart: regular Lungs: CTAB Abd: soft, NT, ND  Lab Results:  No results for input(s): WBC, HGB, HCT, PLT in the last 72 hours. BMET No results for input(s): NA, K, CL, CO2, GLUCOSE, BUN, CREATININE, CALCIUM in the last 72 hours. PT/INR No results for input(s): LABPROT, INR in the last 72 hours. CMP     Component Value Date/Time   NA 136 02/20/2018 0506   K 3.8 02/20/2018 0506   CL 105 02/20/2018 0506   CO2 24 02/20/2018 0506   GLUCOSE 107 (H) 02/20/2018 0506   BUN 11 02/20/2018 0506   CREATININE 0.47 02/20/2018 0506   CALCIUM 8.2 (L) 02/20/2018 0506   PROT 7.6 02/19/2018 1149   ALBUMIN 4.2 02/19/2018 1149   AST 15 02/19/2018 1149   ALT 12 (L) 02/19/2018 1149   ALKPHOS 52 02/19/2018 1149   BILITOT 0.5 02/19/2018 1149   GFRNONAA >60 02/20/2018 0506   GFRAA >60 02/20/2018 0506   Lipase     Component Value Date/Time   LIPASE 81 (H) 02/19/2018 1149       Studies/Results: No results found.  Anti-infectives: Anti-infectives (From admission, onward)   Start     Dose/Rate Route Frequency Ordered Stop   02/26/18 0600  cefoTEtan (CEFOTAN) 2 g in sodium chloride 0.9 % 100 mL IVPB     2 g 200 mL/hr over 30 Minutes Intravenous On  call to O.R. 02/23/18 0950 02/27/18 0559   02/26/18 0000  clindamycin (CLEOCIN) 900 mg, gentamicin (GARAMYCIN) 240 mg in sodium chloride 0.9 % 1,000 mL for intraperitoneal lavage     1 application Intraperitoneal To Surgery 02/23/18 0950 02/27/18 0000   02/25/18 1400  neomycin (MYCIFRADIN) tablet 1,000 mg     1,000 mg Oral 3 times per day 02/23/18 0950 02/26/18 1359   02/23/18 1400  metroNIDAZOLE (FLAGYL) tablet 1,000 mg     1,000 mg Oral 3 times per day 02/23/18 0950 02/24/18 1359   02/19/18 2200  piperacillin-tazobactam (ZOSYN) IVPB 3.375 g     3.375 g 12.5 mL/hr over 240 Minutes Intravenous Every 8 hours 02/19/18 1741     02/19/18 1600  piperacillin-tazobactam (ZOSYN) IVPB 3.375 g     3.375 g 100 mL/hr over 30 Minutes Intravenous  Once 02/19/18 1548 02/19/18 1707       Assessment/Plan  HTN - home meds, PRN hydralazine Anxiety/depression - not currently on medication  Recurrent peri-rectal abscess vs rectrorectal cyst  -patient will likely need to be prepped and likely require a resection of this lesion. -Dr. Michaell CowingGross will further discuss options and plans with her.  FEN - NPO for now ?clears VTE - SCDs/Lovenox ID - Zosyn   LOS: 3 days     Stephanie Shuffield  MD

## 2018-02-24 ENCOUNTER — Encounter (HOSPITAL_COMMUNITY): Payer: Self-pay | Admitting: Internal Medicine

## 2018-02-24 LAB — HEMOGLOBIN A1C
HEMOGLOBIN A1C: 5.2 % (ref 4.8–5.6)
MEAN PLASMA GLUCOSE: 102.54 mg/dL

## 2018-02-24 MED ORDER — NAPROXEN 500 MG PO TABS: 500.0000 mg | ORAL_TABLET | Freq: Two times a day (BID) | ORAL | Status: DC

## 2018-02-24 MED ORDER — METHOCARBAMOL 1000 MG/10ML IJ SOLN
1000.0000 mg | Freq: Four times a day (QID) | INTRAVENOUS | Status: DC | PRN
  Filled 2018-02-24: qty 10

## 2018-02-24 MED ORDER — SUMATRIPTAN SUCCINATE 25 MG PO TABS
25.0000 mg | ORAL_TABLET | ORAL | Status: DC | PRN
  Filled 2018-02-24: qty 1

## 2018-02-24 MED ORDER — SENNOSIDES-DOCUSATE SODIUM 8.6-50 MG PO TABS
1.0000 | ORAL_TABLET | Freq: Two times a day (BID) | ORAL | Status: DC
  Administered 2018-02-24 – 2018-02-28 (×9): 1 via ORAL
  Filled 2018-02-24 (×9): qty 1

## 2018-02-24 MED ORDER — KCL IN DEXTROSE-NACL 20-5-0.45 MEQ/L-%-% IV SOLN
INTRAVENOUS | Status: DC
  Administered 2018-02-25: 1000 mL via INTRAVENOUS
  Administered 2018-02-26: 22:00:00 via INTRAVENOUS
  Filled 2018-02-24 (×2): qty 1000

## 2018-02-24 MED ORDER — SODIUM CHLORIDE 0.9 % IV SOLN: 250.0000 mL | INTRAVENOUS | Status: DC | PRN

## 2018-02-24 MED ORDER — CYCLOBENZAPRINE HCL 10 MG PO TABS
10.0000 mg | ORAL_TABLET | Freq: Three times a day (TID) | ORAL | Status: DC | PRN
  Administered 2018-02-24 (×2): 10 mg via ORAL
  Filled 2018-02-24 (×2): qty 1

## 2018-02-24 MED ORDER — METHOCARBAMOL 500 MG PO TABS: 1000.0000 mg | ORAL_TABLET | Freq: Four times a day (QID) | ORAL | Status: DC | PRN

## 2018-02-24 MED ORDER — ENOXAPARIN SODIUM 40 MG/0.4ML ~~LOC~~ SOLN
40.0000 mg | Freq: Once | SUBCUTANEOUS | Status: AC
  Administered 2018-02-25: 40 mg via SUBCUTANEOUS
  Filled 2018-02-24: qty 0.4

## 2018-02-24 MED ORDER — HYDROCORTISONE ACE-PRAMOXINE 2.5-1 % RE CREA
1.0000 "application " | TOPICAL_CREAM | Freq: Four times a day (QID) | RECTAL | Status: DC
  Administered 2018-02-24 – 2018-02-26 (×9): 1 via RECTAL
  Filled 2018-02-24: qty 30

## 2018-02-24 MED ORDER — CELECOXIB 200 MG PO CAPS
200.0000 mg | ORAL_CAPSULE | Freq: Every day | ORAL | Status: DC
  Administered 2018-02-24 – 2018-02-28 (×5): 200 mg via ORAL
  Filled 2018-02-24 (×5): qty 1

## 2018-02-24 MED ORDER — ENOXAPARIN SODIUM 40 MG/0.4ML ~~LOC~~ SOLN
40.0000 mg | Freq: Once | SUBCUTANEOUS | Status: AC
Start: 2018-02-24 — End: 2018-02-24
  Administered 2018-02-24: 40 mg via SUBCUTANEOUS
  Filled 2018-02-24: qty 0.4

## 2018-02-24 MED ORDER — LACTATED RINGERS IV BOLUS: 1000.0000 mL | Freq: Three times a day (TID) | INTRAVENOUS | Status: AC | PRN

## 2018-02-24 MED ORDER — SODIUM CHLORIDE 0.9% FLUSH
3.0000 mL | Freq: Two times a day (BID) | INTRAVENOUS | Status: DC
  Administered 2018-02-24 – 2018-02-26 (×4): 3 mL via INTRAVENOUS

## 2018-02-24 MED ORDER — ENOXAPARIN SODIUM 40 MG/0.4ML ~~LOC~~ SOLN
40.0000 mg | SUBCUTANEOUS | Status: DC
  Administered 2018-02-27: 40 mg via SUBCUTANEOUS
  Filled 2018-02-24: qty 0.4

## 2018-02-24 MED ORDER — GABAPENTIN 300 MG PO CAPS
300.0000 mg | ORAL_CAPSULE | Freq: Two times a day (BID) | ORAL | Status: DC
  Administered 2018-02-24 – 2018-02-28 (×9): 300 mg via ORAL
  Filled 2018-02-24 (×9): qty 1

## 2018-02-24 MED ORDER — ACETAMINOPHEN 325 MG PO TABS: 325.0000 mg | ORAL_TABLET | Freq: Four times a day (QID) | ORAL | Status: DC | PRN

## 2018-02-24 MED ORDER — MELOXICAM 15 MG PO TABS: 15.0000 mg | ORAL_TABLET | Freq: Every day | ORAL | Status: DC | PRN

## 2018-02-24 MED ORDER — CYCLOBENZAPRINE HCL 10 MG PO TABS: 10.0000 mg | ORAL_TABLET | Freq: Three times a day (TID) | ORAL | Status: DC | PRN

## 2018-02-24 MED ORDER — SODIUM CHLORIDE 0.9% FLUSH: 3.0000 mL | INTRAVENOUS | Status: DC | PRN

## 2018-02-24 MED ORDER — WITCH HAZEL-GLYCERIN EX PADS: 1.0000 "application " | MEDICATED_PAD | CUTANEOUS | Status: DC | PRN

## 2018-02-24 NOTE — Consult Note (Signed)
WOC Nurse requested for preoperative stoma site marking  Discussed surgical procedure and possible stoma creation with patient.  Explained role of the WOC nurse team.  Provided the patient with educational booklet and provided samples of pouching options.  Answered patient questions. Discussed that the marking is "just in case" the surgeons need to perform a colostomy during surgery.  Pt is very anxious and adament that she would not want a colostomy.  Examined patient lying, sitting, and standing in order to place the marking in the patient's visual field, away from any creases or abdominal contour issues and within the rectus muscle.  Attempted to mark below the patient's belt line, but this was not possible since a significant crease occurs when the patient is sitting which should be avoided if possible and mark had to be placed higher on the abd.  Marked for colostomy in the LLQ  _8___ cm to the left of the umbilicus and __2__cm above the umbilicus.  Marked for ileostomy in the RLQ  ___8_cm to the right of the umbilicus and  __2__ cm above the umbilicus.  Patient's abdomen cleansed with CHG wipes at site markings, allowed to air dry prior to marking.Covered mark with thin film transparent dressing to preserve mark until date of surgery.   WOC Nurse team will follow up with patient after surgery for continue ostomy care and teaching if an ostomy is performed.  Cammie Mcgeeawn Genice Kimberlin MSN, RN, CWOCN, [place], CNS [phone]

## 2018-02-24 NOTE — Progress Notes (Signed)
[facility] Progress Note  3 Days Post-Op  Subjective: CC- retrocecal cyst Patient very upset this morning. Nervous about surgery and feels like nobody is listening today. States that she continues to have a lot of pain. Denies n/v. No BMs yesterday.  Objective: Vital signs in last 24 hours: Temp:  [97.8 F (36.6 C)-98.6 F (37 C)] 97.8 F (36.6 C) (06/03 0609) Pulse Rate:  [70-74] 74 (06/03 0609) Resp:  [16-18] 18 (06/03 0609) BP: (113-127)/(60-85) 119/78 (06/03 0609) SpO2:  [100 %] 100 % (06/03 0609) Last BM Date: 02/23/18  Intake/Output from previous day: 06/02 0701 - 06/03 0700 In: 3150 [P.O.:1200; I.V.:1800; IV Piggyback:150] Out: 5 [Urine:3; Stool:2] Intake/Output this shift: No intake/output data recorded.  PE: Gen:  Alert, NAD, tearful HEENT: EOM's intact, pupils equal and round Card:  RRR, no M/G/R heard Pulm:  CTAB, no W/R/R, effort normal Abd: Soft, NT/ND, +BS, no HSM, no hernia Ext: calves soft and nontender Psych: A&Ox3  Skin: warm and dry  Lab Results:  No results for input(s): WBC, HGB, HCT, PLT in the last 72 hours. BMET No results for input(s): NA, K, CL, CO2, GLUCOSE, BUN, CREATININE, CALCIUM in the last 72 hours. PT/INR No results for input(s): LABPROT, INR in the last 72 hours. CMP     Component Value Date/Time   NA 136 02/20/2018 0506   K 3.8 02/20/2018 0506   CL 105 02/20/2018 0506   CO2 24 02/20/2018 0506   GLUCOSE 107 (H) 02/20/2018 0506   BUN 11 02/20/2018 0506   CREATININE 0.47 02/20/2018 0506   CALCIUM 8.2 (L) 02/20/2018 0506   PROT 7.6 02/19/2018 1149   ALBUMIN 4.2 02/19/2018 1149   AST 15 02/19/2018 1149   ALT 12 (L) 02/19/2018 1149   ALKPHOS 52 02/19/2018 1149   BILITOT 0.5 02/19/2018 1149   GFRNONAA >60 02/20/2018 0506   GFRAA >60 02/20/2018 0506   Lipase     Component Value Date/Time   LIPASE 81 (H) 02/19/2018 1149       Studies/Results: No results found.  Anti-infectives: Anti-infectives (From  admission, onward)   Start     Dose/Rate Route Frequency Ordered Stop   02/26/18 0600  cefoTEtan in Dextrose 5% (CEFOTAN) IVPB 2 g     2 g 100 mL/hr over 30 Minutes Intravenous On call to O.R. 02/23/18 0950 02/27/18 0559   02/26/18 0600  clindamycin (CLEOCIN) 900 mg, gentamicin (GARAMYCIN) 240 mg in sodium chloride 0.9 % 1,000 mL for intraperitoneal lavage      Intraperitoneal To Surgery 02/23/18 0950 02/27/18 0600   02/25/18 1400  neomycin (MYCIFRADIN) tablet 1,000 mg  Status:  Discontinued     1,000 mg Oral 3 times per day 02/23/18 0950 02/23/18 1755   02/25/18 1400  metroNIDAZOLE (FLAGYL) tablet 1,000 mg     1,000 mg Oral 3 times per day 02/23/18 1755 02/26/18 1359   02/25/18 1400  neomycin (MYCIFRADIN) tablet 1,000 mg     1,000 mg Oral 3 times per day 02/23/18 1755 02/26/18 1359   02/23/18 1400  metroNIDAZOLE (FLAGYL) tablet 1,000 mg  Status:  Discontinued     1,000 mg Oral 3 times per day 02/23/18 0950 02/23/18 1755   02/19/18 2200  piperacillin-tazobactam (ZOSYN) IVPB 3.375 g     3.375 g 12.5 mL/hr over 240 Minutes Intravenous Every 8 hours 02/19/18 1741     02/19/18 1600  piperacillin-tazobactam (ZOSYN) IVPB 3.375 g     3.375 g 100 mL/hr over 30 Minutes Intravenous  Once  02/19/18 1548 02/19/18 1707       Assessment/Plan HTN - home meds, PRN hydralazine Anxiety/depression - not currently on medication  Recurrent peri-rectal abscess vsrectrorectalcyst  - colonoscopy 5/31 normal - planning for Resection of cystic mass in pelvis, possible resection part of the rectum & colon involving the pathology (minimally invasive, possible open, approach) 6/5 with Dr. Michaell CowingGross, he will further discuss plans with her. - ok for full liquids today. Clear liquids tomorrow and start bowel prep with mag citrate then. Will ask floor manager to meet with patient regarding concerns.  FEN - Dys 1 today, CLD tomorrow VTE - SCDs/Lovenox ID - Zosyn 5/29>>   LOS: 4 days    Franne FortsBrooke A Tayron Hunnell ,  [facility] 02/24/2018, 7:44 AM Pager: [phone] Consults: [phone] Mon 7:00 am -11:30 AM Tues-Fri 7:00 am-4:30 pm Sat-Sun 7:00 am-11:30 am

## 2018-02-24 NOTE — Clinical Social Work Note (Signed)
Clinical Social Work Assessment  Patient Details  Name: Stephanie [name] MRN: [id number] Date of Birth: Jun 28, 1981  Date of referral:  02/24/18               Reason for consult:                   Permission sought to share information with:    Permission granted to share information::     Name::        Agency::     Relationship::     Contact Information:     Housing/Transportation Living arrangements for the past 2 months:  Single Family Home Source of Information:  Patient Patient Interpreter Needed:  None Criminal Activity/Legal Involvement Pertinent to Current Situation/Hospitalization:  No - Comment as needed Significant Relationships:  Other Family Members Lives with:  Minor Children Do you feel safe going back to the place where you live?  Yes Need for family participation in patient care:  No (Coment)  Care giving concerns:  CSW met with patient at bedside, per patient request. CSW explain role as Education officer, museum. Patient explain she has been having anxiety about having a procedure done in the hospital. She reports being seen by psychiatrist and was given medication for her anxiety. She reports the medications "are helping some but I am still having anxiety." Patient tearfully express she hates being without her five children and missing two of their graduations this week. She express concerns about multiple social issues with her current housing situation with [address] and her current fiances through the state,  online classes and fear of failing her classes. . Patient reports her current community case worker is working with her about these concerns.  Patient express concerns with surgeon, having surgery and fear of a colostomy bag.  Social Worker assessment / plan: CSW inquired about resources or anything that may help and support the patient at this time. Patient did not have specific request for resources, she reports having "several people in my business" that are already  helping her at this time.  CSW provided active listening and emotional support. Patient appreciative of CSW listening and offering support.  CSW inquired about outpatient therapist.  Patient reports she sees a therapist outpatient, pcp but did not elaborate where she sees outpatient therapist.   Patient seen by psychiatrist on 6/3 and is currently receiving anxiety medications to help calm her.   Employment status:  Disabled (Comment on whether or not currently receiving Disability) Insurance information:  [employer] PT Recommendations:  Not assessed at this time Information / Referral to community resources:  Outpatient Psychiatric Care (Comment Required)  Patient/Family's Response to care: Agreeable but fearful of care.   Patient/Family's Understanding of and Emotional Response to Diagnosis, Current Treatment, and Prognosis:  Patient presented very anxious and tearful. Patient has a basic understanding of her current diagnosis. She is very fearful about treatment and aftercare.  Patient reports having family support from [place] and her mother.   Emotional Assessment Appearance:  Appears stated age Attitude/Demeanor/Rapport:    Affect (typically observed):  Accepting, Tearful/Crying Orientation:  Oriented to Self, Oriented to Place, Oriented to  Time, Oriented to Situation Alcohol / Substance use:  Not Applicable Psych involvement (Current and /or in the community):  No (Comment)  Discharge Needs  Concerns to be addressed:  Other (Comment Required, Mental Health Concerns Readmission within the last 30 days:  No Current discharge risk:  Psychiatric Illness Barriers to Discharge:  Continued Medical Work up   [employer], LCSW 02/24/2018, 3:37 PM

## 2018-02-24 NOTE — Progress Notes (Signed)
Patient used Herbalistitz bath; she had refused to take one this AM due to pain management issues. Lina SarBeth Anndee Connett, RN

## 2018-02-25 DIAGNOSIS — E876 Hypokalemia: Secondary | ICD-10-CM

## 2018-02-25 LAB — CBC
HCT: 31.6 % — ABNORMAL LOW (ref 36.0–46.0)
HEMOGLOBIN: 10.1 g/dL — AB (ref 12.0–15.0)
MCH: 32.8 pg (ref 26.0–34.0)
MCHC: 32 g/dL (ref 30.0–36.0)
MCV: 102.6 fL — ABNORMAL HIGH (ref 78.0–100.0)
Platelets: 250 10*3/uL (ref 150–400)
RBC: 3.08 MIL/uL — ABNORMAL LOW (ref 3.87–5.11)
RDW: 12.4 % (ref 11.5–15.5)
WBC: 9.3 10*3/uL (ref 4.0–10.5)

## 2018-02-25 LAB — BASIC METABOLIC PANEL
Anion gap: 13 (ref 5–15)
BUN: 7 mg/dL (ref 6–20)
CALCIUM: 8.6 mg/dL — AB (ref 8.9–10.3)
CHLORIDE: 100 mmol/L — AB (ref 101–111)
CO2: 28 mmol/L (ref 22–32)
CREATININE: 0.51 mg/dL (ref 0.44–1.00)
GFR calc Af Amer: >60 mL/min (ref >60)
GFR calc non Af Amer: >60 mL/min (ref >60)
Glucose, Bld: 82 mg/dL (ref 65–99)
Potassium: 2.6 mmol/L — CL (ref 3.5–5.1)
SODIUM: 141 mmol/L (ref 135–145)

## 2018-02-25 LAB — PREALBUMIN: PREALBUMIN: 10.2 mg/dL — AB (ref 18–38)

## 2018-02-25 LAB — MAGNESIUM: Magnesium: 1.6 mg/dL — ABNORMAL LOW (ref 1.7–2.4)

## 2018-02-25 MED ORDER — POTASSIUM CHLORIDE 10 MEQ/100ML IV SOLN: 10.0000 meq | INTRAVENOUS | Status: DC

## 2018-02-25 MED ORDER — POTASSIUM CHLORIDE 10 MEQ/100ML IV SOLN
10.0000 meq | INTRAVENOUS | Status: AC
  Administered 2018-02-25 (×6): 10 meq via INTRAVENOUS
  Filled 2018-02-25 (×3): qty 100

## 2018-02-25 MED ORDER — MAGNESIUM SULFATE 2 GM/50ML IV SOLN
2.0000 g | Freq: Once | INTRAVENOUS | Status: AC
  Administered 2018-02-25: 2 g via INTRAVENOUS
  Filled 2018-02-25: qty 50

## 2018-02-25 MED ORDER — POTASSIUM CHLORIDE CRYS ER 20 MEQ PO TBCR
40.0000 meq | EXTENDED_RELEASE_TABLET | Freq: Two times a day (BID) | ORAL | Status: DC
  Administered 2018-02-25 – 2018-02-26 (×4): 40 meq via ORAL
  Filled 2018-02-25 (×4): qty 2

## 2018-02-25 NOTE — H&P (View-Only) (Signed)
Stephanie [name] [id number] 08-06-1981  CARE TEAM:  PCP: Sherene Sires, DO  Outpatient Care Team: Patient Care Team: Sherene Sires, DO as PCP - General (Family Medicine) Ileana Roup, MD as Consulting Physician (Colon and Rectal Surgery)  Inpatient Treatment Team: Treatment Team: Attending Provider: Edison Pace Md, MD; Rounding Team: Edison Pace, Md, MD; Consulting Physician: Ileana Roup, MD; Technician: Reuel Derby, NT; Technician: Etheleen Sia, NT; Technician: Leda Quail, NT; Registered Nurse: Vicente Serene, RN; Consulting Physician: Michael Boston, MD   Problem List:   Principal Problem:   Recurrent presacral pelvic abscess  Active Problems:   Panic attacks   PTSD (post-traumatic stress disorder)   Constipation, chronic   Schizophrenia ([place])   Internal hemorrhoids with complication   Depression   Hypertension   Pelvic pain   Panic disorder   Hypokalemia   Hypomagnesemia   4 Days Post-Op  02/19/2018 - 02/21/2018  Procedure(s): COLONOSCOPY WITH PROPOFOL   Assessment  Gradually improving with 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  Symptomatic hemorrhoids.  Plan:  -Plan pelvic exploration and excision of the retrorectal/presacral cystic mass.  Drain placement.  Minimally invasive hopefully robotic approach.  Offered to do examination anesthesia and see if she needed hemorrhoidal ligation/pexy and possible hemorrhoid ectomy.  Also rule out any perianal fistula ASIS or infections.  I again went over the reasoning for surgery in detail.  Discussed with charge nurse and councilman.  Patient much more calm and relaxed.  Ready to go forward to surgery.  Questions and concerns addressed.  She expressed appreciation.  Will redo bowel prep today.  Hopefully not too likely for a colostomy but I did caution that is a possibility for a need for it temporarily.  Patient ready proceed.  Plan for tomorrow  The anatomy & physiology  of the digestive tract was discussed.  The pathophysiology of the colon was discussed.  Natural history risks without surgery was discussed.   I feel the risks of no intervention will lead to serious problems that outweigh the operative risks; therefore, I recommended resection of the pelvic mass with possible low anterior resection to remove the pathology.  Minimally invasive (Robotic/Laparoscopic) & open techniques were discussed.   Risks such as bleeding, infection, abscess, leak, reoperation, injury to other organs, need for repair of tissues / organs, possible ostomy, hernia, heart attack, stroke, death, and other risks were discussed.  I noted a good likelihood this will help address the problem.   Goals of post-operative recovery were discussed as well.   Need for adequate nutrition, daily bowel regimen and healthy physical activity, to optimize recovery was noted as well. We will work to minimize complications.  Educational materials were available as well.  Questions were answered.  The patient expresses understanding & wishes to proceed with surgery.  -PTSD and panic attacks.  Appears to be under better control.  Psychiatric help and nursing and case management and social work appreciated.  Continue Celexa and hydroxyzine.  Agree with trying to avoid benzodiazepines.  -Continue IV Zosyn for probable infection of the cyst.  Leukocytosis resolved.  Pain less and under better control.  No fevers.  Reassuring Hypokalemia -correcting Hypomagnesemia.  -correct. -VTE prophylaxis- SCDs, etc -mobilize as tolerated to help recovery  30 minutes spent in review, evaluation, examination, counseling, and coordination of care.  More than 50% of that time was spent in counseling.  Adin Hector, MD, FACS, MASCRS Gastrointestinal and Minimally Invasive Surgery    [address], [address] [place], [place] [address] [phone] Main / Paging [phone]  Fax   02/25/2018    Subjective: (Chief complaint)  Patient having less pain.  Still questions with surgery.  Wanted to discuss again.  Objective:  Vital signs:  Vitals:   02/24/18 0609 02/24/18 1450 02/24/18 1949 02/25/18 0636  BP: 119/78 112/67 129/85 127/85  Pulse: 74 67 77 82  Resp: 18 17 16 20  Temp: 97.8 F (36.6 C) 98.2 F (36.8 C) 98.6 F (37 C) 99.2 F (37.3 C)  TempSrc: Oral Oral Oral Oral  SpO2: 100% 100% 98% 98%  Weight:      Height:        Last BM Date: 02/23/18  Intake/Output   Yesterday:  06/03 0701 - 06/04 0700 In: 800 [P.O.:450; IV Piggyback:350] Out: -  This shift:  No intake/output data recorded.  Bowel function:  Flatus: YES  BM:  YES  Drain: (No drain)   Physical Exam:  General: Pt awake/alert/oriented x4 in no acute distress Eyes: PERRL, normal EOM.  Sclera clear.  No icterus Neuro: CN II-XII intact w/o focal sensory/motor deficits. Lymph: No head/neck/groin lymphadenopathy Psych:  No delerium/psychosis/paranoia HENT: Normocephalic, Mucus membranes moist.  No thrush Neck: Supple, No tracheal deviation Chest: No chest wall pain w good excursion CV:  Pulses intact.  Regular rhythm MS: Normal AROM mjr joints.  No obvious deformity  Abdomen: Soft.  Nondistended.  Nontender.  No evidence of peritonitis.  No incarcerated hernias.  Ext:  No deformity.  No mjr edema.  No cyanosis Skin: No petechiae / purpura  Results:   Labs: Results for orders placed or performed during the hospital encounter of 02/19/18 (from the past 48 hour(s))  Hemoglobin A1c     Status: None   Collection Time: 02/24/18  4:38 AM  Result Value Ref Range   Hgb A1c MFr Bld 5.2 4.8 - 5.6 %    Comment: (NOTE) Pre diabetes:          5.7%-6.4% Diabetes:              >6.4% Glycemic control for   <7.0% adults with diabetes    Mean Plasma Glucose 102.54 mg/dL    Comment: Performed at [facility], [address] [place], [place] [address]  CBC      Status: Abnormal   Collection Time: 02/25/18  4:13 AM  Result Value Ref Range   WBC 9.3 4.0 - 10.5 K/uL   RBC 3.08 (L) 3.87 - 5.11 MIL/uL   Hemoglobin 10.1 (L) 12.0 - 15.0 g/dL   HCT 31.6 (L) 36.0 - 46.0 %   MCV 102.6 (H) 78.0 - 100.0 fL   MCH 32.8 26.0 - 34.0 pg   MCHC 32.0 30.0 - 36.0 g/dL   RDW 12.4 11.5 - 15.5 %   Platelets 250 150 - 400 K/uL    Comment: Performed at [facility], [address] [place], [place] [address]  Basic metabolic panel     Status: Abnormal   Collection Time: 02/25/18  4:13 AM  Result Value Ref Range   Sodium 141 135 - 145 mmol/L   Potassium 2.6 (LL) 3.5 - 5.1 mmol/L    Comment: CRITICAL RESULT CALLED TO, READ BACK BY AND VERIFIED WITH: C TIPOPEN RN 0503 02/25/18 A NAVARRO    Chloride 100 (L) 101 - 111 mmol/L   CO2 28 22 - 32 mmol/L   Glucose, Bld 82 65 - 99 mg/dL   BUN 7   6 - 20 mg/dL   Creatinine, Ser 0.51 0.44 - 1.00 mg/dL   Calcium 8.6 (L) 8.9 - 10.3 mg/dL   GFR calc non Af Amer >60 >60 mL/min   GFR calc Af Amer >60 >60 mL/min    Comment: (NOTE) The eGFR has been calculated using the CKD EPI equation. This calculation has not been validated in all clinical situations. eGFR's persistently <60 mL/min signify possible Chronic Kidney Disease.    Anion gap 13 5 - 15    Comment: Performed at [facility], [address] [place], [place] [address]  Magnesium     Status: Abnormal   Collection Time: 02/25/18  4:13 AM  Result Value Ref Range   Magnesium 1.6 (L) 1.7 - 2.4 mg/dL    Comment: Performed at [facility], [address] [place], [place] [address]    Imaging / Studies: No results found.  Medications / Allergies: per chart  Antibiotics: Anti-infectives (From admission, onward)   Start     Dose/Rate Route Frequency Ordered Stop   02/26/18 0600  cefoTEtan in Dextrose 5% (CEFOTAN) IVPB 2 g     2 g 100 mL/hr over 30 Minutes Intravenous On call to O.R. 02/23/18 0950 02/27/18 0559    02/26/18 0600  clindamycin (CLEOCIN) 900 mg, gentamicin (GARAMYCIN) 240 mg in sodium chloride 0.9 % 1,000 mL for intraperitoneal lavage      Intraperitoneal To Surgery 02/23/18 0950 02/27/18 0600   02/25/18 1400  neomycin (MYCIFRADIN) tablet 1,000 mg  Status:  Discontinued     1,000 mg Oral 3 times per day 02/23/18 0950 02/23/18 1755   02/25/18 1400  metroNIDAZOLE (FLAGYL) tablet 1,000 mg     1,000 mg Oral 3 times per day 02/23/18 1755 02/26/18 1359   02/25/18 1400  neomycin (MYCIFRADIN) tablet 1,000 mg     1,000 mg Oral 3 times per day 02/23/18 1755 02/26/18 1359   02/23/18 1400  metroNIDAZOLE (FLAGYL) tablet 1,000 mg  Status:  Discontinued     1,000 mg Oral 3 times per day 02/23/18 0950 02/23/18 1755   02/19/18 2200  piperacillin-tazobactam (ZOSYN) IVPB 3.375 g     3.375 g 12.5 mL/hr over 240 Minutes Intravenous Every 8 hours 02/19/18 1741     02/19/18 1600  piperacillin-tazobactam (ZOSYN) IVPB 3.375 g     3.375 g 100 mL/hr over 30 Minutes Intravenous  Once 02/19/18 1548 02/19/18 1707        Note: Portions of this report may have been transcribed using voice recognition software. Every effort was made to ensure accuracy; however, inadvertent computerized transcription errors may be present.   Any transcriptional errors that result from this process are unintentional.     Tomasina Keasling C. Adalynn Corne, MD,FACS, MASCRS [address] [place], [place] [address] Main: [phone] FAX: [phone]  02/25/2018   

## 2018-02-25 NOTE — Progress Notes (Addendum)
Stephanie [name] [id number] 08-06-1981  CARE TEAM:  PCP: Sherene Sires, DO  Outpatient Care Team: Patient Care Team: Sherene Sires, DO as PCP - General (Family Medicine) Ileana Roup, MD as Consulting Physician (Colon and Rectal Surgery)  Inpatient Treatment Team: Treatment Team: Attending Provider: Edison Pace Md, MD; Rounding Team: Edison Pace, Md, MD; Consulting Physician: Ileana Roup, MD; Technician: Reuel Derby, NT; Technician: Etheleen Sia, NT; Technician: Leda Quail, NT; Registered Nurse: Vicente Serene, RN; Consulting Physician: Michael Boston, MD   Problem List:   Principal Problem:   Recurrent presacral pelvic abscess  Active Problems:   Panic attacks   PTSD (post-traumatic stress disorder)   Constipation, chronic   Schizophrenia ([place])   Internal hemorrhoids with complication   Depression   Hypertension   Pelvic pain   Panic disorder   Hypokalemia   Hypomagnesemia   4 Days Post-Op  02/19/2018 - 02/21/2018  Procedure(s): COLONOSCOPY WITH PROPOFOL   Assessment  Gradually improving with 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  Symptomatic hemorrhoids.  Plan:  -Plan pelvic exploration and excision of the retrorectal/presacral cystic mass.  Drain placement.  Minimally invasive hopefully robotic approach.  Offered to do examination anesthesia and see if she needed hemorrhoidal ligation/pexy and possible hemorrhoid ectomy.  Also rule out any perianal fistula ASIS or infections.  I again went over the reasoning for surgery in detail.  Discussed with charge nurse and councilman.  Patient much more calm and relaxed.  Ready to go forward to surgery.  Questions and concerns addressed.  She expressed appreciation.  Will redo bowel prep today.  Hopefully not too likely for a colostomy but I did caution that is a possibility for a need for it temporarily.  Patient ready proceed.  Plan for tomorrow  The anatomy & physiology  of the digestive tract was discussed.  The pathophysiology of the colon was discussed.  Natural history risks without surgery was discussed.   I feel the risks of no intervention will lead to serious problems that outweigh the operative risks; therefore, I recommended resection of the pelvic mass with possible low anterior resection to remove the pathology.  Minimally invasive (Robotic/Laparoscopic) & open techniques were discussed.   Risks such as bleeding, infection, abscess, leak, reoperation, injury to other organs, need for repair of tissues / organs, possible ostomy, hernia, heart attack, stroke, death, and other risks were discussed.  I noted a good likelihood this will help address the problem.   Goals of post-operative recovery were discussed as well.   Need for adequate nutrition, daily bowel regimen and healthy physical activity, to optimize recovery was noted as well. We will work to minimize complications.  Educational materials were available as well.  Questions were answered.  The patient expresses understanding & wishes to proceed with surgery.  -PTSD and panic attacks.  Appears to be under better control.  Psychiatric help and nursing and case management and social work appreciated.  Continue Celexa and hydroxyzine.  Agree with trying to avoid benzodiazepines.  -Continue IV Zosyn for probable infection of the cyst.  Leukocytosis resolved.  Pain less and under better control.  No fevers.  Reassuring Hypokalemia -correcting Hypomagnesemia.  -correct. -VTE prophylaxis- SCDs, etc -mobilize as tolerated to help recovery  30 minutes spent in review, evaluation, examination, counseling, and coordination of care.  More than 50% of that time was spent in counseling.  Adin Hector, MD, FACS, MASCRS Gastrointestinal and Minimally Invasive Surgery    [address]  [address], [place], [place] [address] [phone] Main / Paging [phone]  Fax   02/25/2018    Subjective: (Chief complaint)  Patient having less pain.  Still questions with surgery.  Wanted to discuss again.  Objective:  Vital signs:  Vitals:   02/24/18 0609 02/24/18 1450 02/24/18 1949 02/25/18 0636  BP: 119/78 112/67 129/85 127/85  Pulse: 74 67 77 82  Resp: _0 Temp: 97.8 F (36.6 C) 98.2 F (36.8 C) 98.6 F (37 C) 99.2 F (37.3 C)  TempSrc: Oral Oral Oral Oral  SpO2: 100% 100% 98% 98%  Weight:      Height:        Last BM Date: 02/23/18  Intake/Output   Yesterday:  06/03 0701 - 06/04 0700 In: 800 [P.O.:450; IV Piggyback:350] Out: -  This shift:  No intake/output data recorded.  Bowel function:  Flatus: YES  BM:  YES  Drain: (No drain)   Physical Exam:  General: Pt awake/alert/oriented x4 in no acute distress Eyes: PERRL, normal EOM.  Sclera clear.  No icterus Neuro: CN II-XII intact w/o focal sensory/motor deficits. Lymph: No head/neck/groin lymphadenopathy Psych:  No delerium/psychosis/paranoia HENT: Normocephalic, Mucus membranes moist.  No thrush Neck: Supple, No tracheal deviation Chest: No chest wall pain w good excursion CV:  Pulses intact.  Regular rhythm MS: Normal AROM mjr joints.  No obvious deformity  Abdomen: Soft.  Nondistended.  Nontender.  No evidence of peritonitis.  No incarcerated hernias.  Ext:  No deformity.  No mjr edema.  No cyanosis Skin: No petechiae / purpura  Results:   Labs: Results for orders placed or performed during the hospital encounter of 02/19/18 (from the past 48 hour(s))  Hemoglobin A1c     Status: None   Collection Time: 02/24/18  4:38 AM  Result Value Ref Range   Hgb A1c MFr Bld 5.2 4.8 - 5.6 %    Comment: (NOTE) Pre diabetes:          5.7%-6.4% Diabetes:              >6.4% Glycemic control for   <7.0% adults with diabetes    Mean Plasma Glucose 102.54 mg/dL    Comment: Performed at [facility], [place] [address] [place], [place] [address]  CBC      Status: Abnormal   Collection Time: 02/25/18  4:13 AM  Result Value Ref Range   WBC 9.3 4.0 - 10.5 K/uL   RBC 3.08 (L) 3.87 - 5.11 MIL/uL   Hemoglobin 10.1 (L) 12.0 - 15.0 g/dL   HCT 31.6 (L) 36.0 - 46.0 %   MCV 102.6 (H) 78.0 - 100.0 fL   MCH 32.8 26.0 - 34.0 pg   MCHC 32.0 30.0 - 36.0 g/dL   RDW 12.4 11.5 - 15.5 %   Platelets 250 150 - 400 K/uL    Comment: Performed at [facility], [place] [address] [place], [place] [address]  Basic metabolic panel     Status: Abnormal   Collection Time: 02/25/18  4:13 AM  Result Value Ref Range   Sodium 141 135 - 145 mmol/L   Potassium 2.6 (LL) 3.5 - 5.1 mmol/L    Comment: CRITICAL RESULT CALLED TO, READ BACK BY AND VERIFIED WITH: C TIPOPEN RN 0503 02/25/18 A NAVARRO    Chloride 100 (L) 101 - 111 mmol/L   CO2 28 22 - 32 mmol/L   Glucose, Bld 82 65 - 99 mg/dL   BUN 7  6 - 20 mg/dL   Creatinine, Ser 0.51 0.44 - 1.00 mg/dL   Calcium 8.6 (L) 8.9 - 10.3 mg/dL   GFR calc non Af Amer >60 >60 mL/min   GFR calc Af Amer >60 >60 mL/min    Comment: (NOTE) The eGFR has been calculated using the CKD EPI equation. This calculation has not been validated in all clinical situations. eGFR's persistently <60 mL/min signify possible Chronic Kidney Disease.    Anion gap 13 5 - 15    Comment: Performed at [facility], [address] [place], [place] [address]  Magnesium     Status: Abnormal   Collection Time: 02/25/18  4:13 AM  Result Value Ref Range   Magnesium 1.6 (L) 1.7 - 2.4 mg/dL    Comment: Performed at [facility], [address] [place], [place] [address]    Imaging / Studies: No results found.  Medications / Allergies: per chart  Antibiotics: Anti-infectives (From admission, onward)   Start     Dose/Rate Route Frequency Ordered Stop   02/26/18 0600  cefoTEtan in Dextrose 5% (CEFOTAN) IVPB 2 g     2 g 100 mL/hr over 30 Minutes Intravenous On call to O.R. 02/23/18 0950 02/27/18 0559    02/26/18 0600  clindamycin (CLEOCIN) 900 mg, gentamicin (GARAMYCIN) 240 mg in sodium chloride 0.9 % 1,000 mL for intraperitoneal lavage      Intraperitoneal To Surgery 02/23/18 0950 02/27/18 0600   02/25/18 1400  neomycin (MYCIFRADIN) tablet 1,000 mg  Status:  Discontinued     1,000 mg Oral 3 times per day 02/23/18 0950 02/23/18 1755   02/25/18 1400  metroNIDAZOLE (FLAGYL) tablet 1,000 mg     1,000 mg Oral 3 times per day 02/23/18 1755 02/26/18 1359   02/25/18 1400  neomycin (MYCIFRADIN) tablet 1,000 mg     1,000 mg Oral 3 times per day 02/23/18 1755 02/26/18 1359   02/23/18 1400  metroNIDAZOLE (FLAGYL) tablet 1,000 mg  Status:  Discontinued     1,000 mg Oral 3 times per day 02/23/18 0950 02/23/18 1755   02/19/18 2200  piperacillin-tazobactam (ZOSYN) IVPB 3.375 g     3.375 g 12.5 mL/hr over 240 Minutes Intravenous Every 8 hours 02/19/18 1741     02/19/18 1600  piperacillin-tazobactam (ZOSYN) IVPB 3.375 g     3.375 g 100 mL/hr over 30 Minutes Intravenous  Once 02/19/18 1548 02/19/18 1707        Note: Portions of this report may have been transcribed using voice recognition software. Every effort was made to ensure accuracy; however, inadvertent computerized transcription errors may be present.   Any transcriptional errors that result from this process are unintentional.      C. , MD,FACS, MASCRS [address] [place], [place] [address] Main: [phone] FAX: [phone]  02/25/2018   

## 2018-02-26 ENCOUNTER — Encounter (HOSPITAL_COMMUNITY): Admission: EM | Disposition: A | Discharge status: Home / Self Care | Payer: Self-pay | Source: Home / Self Care

## 2018-02-26 ENCOUNTER — Inpatient Hospital Stay (HOSPITAL_COMMUNITY): Payer: Medicaid Other | Admitting: Certified Registered Nurse Anesthetist

## 2018-02-26 ENCOUNTER — Encounter (HOSPITAL_COMMUNITY): Payer: Self-pay | Admitting: Certified Registered Nurse Anesthetist

## 2018-02-26 DIAGNOSIS — K644 Residual hemorrhoidal skin tags: Secondary | ICD-10-CM

## 2018-02-26 DIAGNOSIS — R1900 Intra-abdominal and pelvic swelling, mass and lump, unspecified site: Secondary | ICD-10-CM

## 2018-02-26 HISTORY — PX: XI ROBOTIC ASSISTED LOWER ANTERIOR RESECTION: SHX6558

## 2018-02-26 HISTORY — PX: EVALUATION UNDER ANESTHESIA WITH HEMORRHOIDECTOMY: SHX5624

## 2018-02-26 LAB — MAGNESIUM: Magnesium: 2.1 mg/dL (ref 1.7–2.4)

## 2018-02-26 LAB — CREATININE, SERUM
Creatinine, Ser: 0.48 mg/dL (ref 0.44–1.00)
GFR calc Af Amer: >60 mL/min (ref >60)

## 2018-02-26 LAB — TYPE AND SCREEN
ABO/RH(D): O POS
ANTIBODY SCREEN: NEGATIVE

## 2018-02-26 LAB — MRSA PCR SCREENING: MRSA by PCR: NEGATIVE

## 2018-02-26 LAB — POTASSIUM: POTASSIUM: 4.1 mmol/L (ref 3.5–5.1)

## 2018-02-26 LAB — ABO/RH: ABO/RH(D): O POS

## 2018-02-26 SURGERY — RESECTION, RECTUM, LOW ANTERIOR, ROBOT-ASSISTED
Anesthesia: General | Site: Rectum

## 2018-02-26 MED ORDER — PIPERACILLIN-TAZOBACTAM 3.375 G IVPB
3.3750 g | Freq: Three times a day (TID) | INTRAVENOUS | Status: DC
  Administered 2018-02-26 – 2018-02-28 (×5): 3.375 g via INTRAVENOUS
  Filled 2018-02-26 (×5): qty 50

## 2018-02-26 MED ORDER — LIDOCAINE 2% (20 MG/ML) 5 ML SYRINGE
INTRAMUSCULAR | Status: AC
  Filled 2018-02-26: qty 5

## 2018-02-26 MED ORDER — SODIUM CHLORIDE 0.9 % IV SOLN
INTRAVENOUS | Status: AC
  Filled 2018-02-26: qty 2

## 2018-02-26 MED ORDER — METHYLENE BLUE 0.5 % INJ SOLN
INTRAVENOUS | Status: AC
  Filled 2018-02-26: qty 10

## 2018-02-26 MED ORDER — HYDROMORPHONE HCL 1 MG/ML IJ SOLN
0.2500 mg | INTRAMUSCULAR | Status: DC | PRN
  Administered 2018-02-26 (×2): 0.5 mg via INTRAVENOUS

## 2018-02-26 MED ORDER — FENTANYL CITRATE (PF) 100 MCG/2ML IJ SOLN
50.0000 ug | INTRAMUSCULAR | Status: DC
  Administered 2018-02-26 (×2): 50 ug via INTRAVENOUS

## 2018-02-26 MED ORDER — PHENYLEPHRINE 40 MCG/ML (10ML) SYRINGE FOR IV PUSH (FOR BLOOD PRESSURE SUPPORT)
PREFILLED_SYRINGE | INTRAVENOUS | Status: DC | PRN
  Administered 2018-02-26: 80 ug via INTRAVENOUS

## 2018-02-26 MED ORDER — SUGAMMADEX SODIUM 200 MG/2ML IV SOLN
INTRAVENOUS | Status: AC
  Filled 2018-02-26: qty 2

## 2018-02-26 MED ORDER — SUGAMMADEX SODIUM 200 MG/2ML IV SOLN
INTRAVENOUS | Status: DC | PRN
  Administered 2018-02-26: 150 mg via INTRAVENOUS

## 2018-02-26 MED ORDER — ONDANSETRON HCL 4 MG/2ML IJ SOLN: 4.0000 mg | Freq: Four times a day (QID) | INTRAMUSCULAR | Status: DC | PRN

## 2018-02-26 MED ORDER — BUPIVACAINE-EPINEPHRINE (PF) 0.25% -1:200000 IJ SOLN
INTRAMUSCULAR | Status: AC
  Filled 2018-02-26: qty 30

## 2018-02-26 MED ORDER — ONDANSETRON HCL 4 MG/2ML IJ SOLN
INTRAMUSCULAR | Status: AC
  Filled 2018-02-26: qty 2

## 2018-02-26 MED ORDER — LACTATED RINGERS IR SOLN
Status: DC | PRN
  Administered 2018-02-26: 2000 mL

## 2018-02-26 MED ORDER — LACTATED RINGERS IV BOLUS: 1000.0000 mL | Freq: Three times a day (TID) | INTRAVENOUS | Status: DC | PRN

## 2018-02-26 MED ORDER — MORPHINE SULFATE (PF) 2 MG/ML IV SOLN
2.0000 mg | INTRAVENOUS | Status: DC | PRN
  Administered 2018-02-26 – 2018-02-28 (×7): 2 mg via INTRAVENOUS
  Filled 2018-02-26 (×7): qty 1

## 2018-02-26 MED ORDER — MIDAZOLAM HCL 2 MG/2ML IJ SOLN
INTRAMUSCULAR | Status: AC
  Filled 2018-02-26: qty 2

## 2018-02-26 MED ORDER — HYDROXYZINE HCL 10 MG PO TABS: 10.0000 mg | ORAL_TABLET | Freq: Two times a day (BID) | ORAL | 5 refills | Status: DC

## 2018-02-26 MED ORDER — SENNOSIDES-DOCUSATE SODIUM 8.6-50 MG PO TABS: 1.0000 | ORAL_TABLET | Freq: Two times a day (BID) | ORAL | 5 refills | Status: DC

## 2018-02-26 MED ORDER — LACTATED RINGERS IV SOLN
INTRAVENOUS | Status: DC
  Administered 2018-02-26 (×2): via INTRAVENOUS

## 2018-02-26 MED ORDER — LIDOCAINE 2% (20 MG/ML) 5 ML SYRINGE
INTRAMUSCULAR | Status: DC | PRN
Start: 2018-02-26 — End: 2018-02-26
  Administered 2018-02-26: 40 mg via INTRAVENOUS

## 2018-02-26 MED ORDER — HYDROMORPHONE HCL 1 MG/ML IJ SOLN
INTRAMUSCULAR | Status: AC
  Administered 2018-02-26: 0.5 mg via INTRAVENOUS
  Filled 2018-02-26: qty 1

## 2018-02-26 MED ORDER — ONDANSETRON HCL 4 MG/2ML IJ SOLN
INTRAMUSCULAR | Status: DC | PRN
  Administered 2018-02-26 (×2): 4 mg via INTRAVENOUS

## 2018-02-26 MED ORDER — BUPIVACAINE-EPINEPHRINE (PF) 0.25% -1:200000 IJ SOLN
INTRAMUSCULAR | Status: DC | PRN
  Administered 2018-02-26: 60 mL

## 2018-02-26 MED ORDER — OXYCODONE HCL 5 MG PO TABS: 5.0000 mg | ORAL_TABLET | Freq: Once | ORAL | Status: DC | PRN

## 2018-02-26 MED ORDER — LIDOCAINE 2% (20 MG/ML) 5 ML SYRINGE
INTRAMUSCULAR | Status: DC | PRN
  Administered 2018-02-26: 1.5 mg/kg/h via INTRAVENOUS

## 2018-02-26 MED ORDER — ROCURONIUM BROMIDE 10 MG/ML (PF) SYRINGE
PREFILLED_SYRINGE | INTRAVENOUS | Status: AC
  Filled 2018-02-26: qty 5

## 2018-02-26 MED ORDER — OXYCODONE HCL 5 MG PO TABS: 5.0000 mg | ORAL_TABLET | Freq: Four times a day (QID) | ORAL | 0 refills | Status: DC | PRN

## 2018-02-26 MED ORDER — OXYCODONE HCL 5 MG/5ML PO SOLN
5.0000 mg | Freq: Once | ORAL | Status: DC | PRN
  Filled 2018-02-26: qty 5

## 2018-02-26 MED ORDER — SODIUM CHLORIDE 0.9 % IJ SOLN
INTRAMUSCULAR | Status: AC
  Filled 2018-02-26: qty 10

## 2018-02-26 MED ORDER — SODIUM CHLORIDE 0.9 % IV SOLN
INTRAVENOUS | Status: DC | PRN
  Administered 2018-02-26: 17:00:00

## 2018-02-26 MED ORDER — DEXAMETHASONE SODIUM PHOSPHATE 10 MG/ML IJ SOLN
INTRAMUSCULAR | Status: AC
  Filled 2018-02-26: qty 1

## 2018-02-26 MED ORDER — MIDAZOLAM HCL 5 MG/5ML IJ SOLN
INTRAMUSCULAR | Status: DC | PRN
  Administered 2018-02-26: 2 mg via INTRAVENOUS

## 2018-02-26 MED ORDER — FENTANYL CITRATE (PF) 250 MCG/5ML IJ SOLN
INTRAMUSCULAR | Status: AC
  Filled 2018-02-26: qty 5

## 2018-02-26 MED ORDER — DIBUCAINE 1 % RE OINT
TOPICAL_OINTMENT | RECTAL | Status: AC
  Filled 2018-02-26: qty 28

## 2018-02-26 MED ORDER — 0.9 % SODIUM CHLORIDE (POUR BTL) OPTIME
TOPICAL | Status: DC | PRN
  Administered 2018-02-26: 2000 mL

## 2018-02-26 MED ORDER — CITALOPRAM HYDROBROMIDE 10 MG PO TABS: 10.0000 mg | ORAL_TABLET | Freq: Every day | ORAL | 5 refills | Status: DC

## 2018-02-26 MED ORDER — BUPIVACAINE-EPINEPHRINE (PF) 0.25% -1:200000 IJ SOLN
INTRAMUSCULAR | Status: AC
  Filled 2018-02-26: qty 60

## 2018-02-26 MED ORDER — KETAMINE HCL 10 MG/ML IJ SOLN
INTRAMUSCULAR | Status: AC
  Filled 2018-02-26: qty 1

## 2018-02-26 MED ORDER — PROPOFOL 10 MG/ML IV BOLUS
INTRAVENOUS | Status: DC | PRN
  Administered 2018-02-26: 200 mg via INTRAVENOUS

## 2018-02-26 MED ORDER — PROPOFOL 10 MG/ML IV BOLUS
INTRAVENOUS | Status: AC
  Filled 2018-02-26: qty 20

## 2018-02-26 MED ORDER — FENTANYL CITRATE (PF) 100 MCG/2ML IJ SOLN
INTRAMUSCULAR | Status: AC
  Administered 2018-02-26: 50 ug via INTRAVENOUS
  Filled 2018-02-26: qty 2

## 2018-02-26 MED ORDER — BUPIVACAINE LIPOSOME 1.3 % IJ SUSP
INTRAMUSCULAR | Status: DC | PRN
  Administered 2018-02-26: 20 mL

## 2018-02-26 MED ORDER — KETAMINE HCL 10 MG/ML IJ SOLN
INTRAMUSCULAR | Status: DC | PRN
  Administered 2018-02-26: 30 mg via INTRAVENOUS
  Administered 2018-02-26: 20 mg via INTRAVENOUS

## 2018-02-26 MED ORDER — FENTANYL CITRATE (PF) 250 MCG/5ML IJ SOLN
INTRAMUSCULAR | Status: DC | PRN
  Administered 2018-02-26: 150 ug via INTRAVENOUS
  Administered 2018-02-26: 100 ug via INTRAVENOUS

## 2018-02-26 MED ORDER — ROCURONIUM BROMIDE 10 MG/ML (PF) SYRINGE
PREFILLED_SYRINGE | INTRAVENOUS | Status: DC | PRN
  Administered 2018-02-26: 10 mg via INTRAVENOUS
  Administered 2018-02-26 (×2): 50 mg via INTRAVENOUS
  Administered 2018-02-26: 20 mg via INTRAVENOUS

## 2018-02-26 MED ORDER — ENSURE SURGERY PO LIQD
237.0000 mL | Freq: Two times a day (BID) | ORAL | Status: DC
  Administered 2018-02-26 – 2018-02-28 (×3): 237 mL via ORAL
  Filled 2018-02-26 (×5): qty 237

## 2018-02-26 MED ORDER — INDOCYANINE GREEN 25 MG IV SOLR
INTRAVENOUS | Status: DC | PRN
  Administered 2018-02-26: 5 mg via INTRAVENOUS

## 2018-02-26 MED ORDER — GABAPENTIN 300 MG PO CAPS: 300.0000 mg | ORAL_CAPSULE | Freq: Two times a day (BID) | ORAL | 1 refills | Status: DC

## 2018-02-26 MED ORDER — DEXAMETHASONE SODIUM PHOSPHATE 10 MG/ML IJ SOLN
INTRAMUSCULAR | Status: DC | PRN
  Administered 2018-02-26: 10 mg via INTRAVENOUS

## 2018-02-26 SURGICAL SUPPLY — 132 items
APL SKNCLS STERI-STRIP NONHPOA (GAUZE/BANDAGES/DRESSINGS)
APPLIER CLIP 5 13 M/L LIGAMAX5 (MISCELLANEOUS)
APPLIER CLIP ROT 10 11.4 M/L (STAPLE)
APR CLP MED LRG 11.4X10 (STAPLE)
APR CLP MED LRG 5 ANG JAW (MISCELLANEOUS)
BAG SPEC RTRVL 10 TROC 200 (ENDOMECHANICALS) ×2
BENZOIN TINCTURE PRP APPL 2/3 (GAUZE/BANDAGES/DRESSINGS) ×2 IMPLANT
BLADE EXTENDED COATED 6.5IN (ELECTRODE) ×4 IMPLANT
BLADE SURG 15 STRL LF DISP TIS (BLADE) IMPLANT
BLADE SURG 15 STRL SS (BLADE)
BRIEF STRETCH FOR OB PAD LRG (UNDERPADS AND DIAPERS) ×4 IMPLANT
CANNULA REDUC XI 12-8 STAPL (CANNULA) ×1
CANNULA REDUC XI 12-8MM STAPL (CANNULA) ×1
CANNULA REDUCER 12-8 DVNC XI (CANNULA) ×2 IMPLANT
CELLS DAT CNTRL 66122 CELL SVR (MISCELLANEOUS) IMPLANT
CHLORAPREP W/TINT 26ML (MISCELLANEOUS) ×4 IMPLANT
CLIP APPLIE 5 13 M/L LIGAMAX5 (MISCELLANEOUS) IMPLANT
CLIP APPLIE ROT 10 11.4 M/L (STAPLE) IMPLANT
CLIP VESOLOCK LG 6/CT PURPLE (CLIP) IMPLANT
CLIP VESOLOCK MED LG 6/CT (CLIP) IMPLANT
CONT SPEC 4OZ CLIKSEAL STRL BL (MISCELLANEOUS) ×4 IMPLANT
COVER SURGICAL LIGHT HANDLE (MISCELLANEOUS) ×4 IMPLANT
COVER TIP SHEARS 8 DVNC (MISCELLANEOUS) ×2 IMPLANT
COVER TIP SHEARS 8MM DA VINCI (MISCELLANEOUS) ×2
DECANTER SPIKE VIAL GLASS SM (MISCELLANEOUS) ×4 IMPLANT
DEVICE TROCAR PUNCTURE CLOSURE (ENDOMECHANICALS) IMPLANT
DRAIN CHANNEL 19F RND (DRAIN) ×6 IMPLANT
DRAPE ARM DVNC X/XI (DISPOSABLE) ×6 IMPLANT
DRAPE COLUMN DVNC XI (DISPOSABLE) ×2 IMPLANT
DRAPE DA VINCI XI ARM (DISPOSABLE) ×8
DRAPE DA VINCI XI COLUMN (DISPOSABLE) ×2
DRAPE LAPAROTOMY T 102X78X121 (DRAPES) ×4 IMPLANT
DRAPE SURG IRRIG POUCH 19X23 (DRAPES) ×4 IMPLANT
DRSG OPSITE POSTOP 4X10 (GAUZE/BANDAGES/DRESSINGS) IMPLANT
DRSG OPSITE POSTOP 4X6 (GAUZE/BANDAGES/DRESSINGS) IMPLANT
DRSG OPSITE POSTOP 4X8 (GAUZE/BANDAGES/DRESSINGS) IMPLANT
DRSG PAD ABDOMINAL 8X10 ST (GAUZE/BANDAGES/DRESSINGS) IMPLANT
DRSG TEGADERM 2-3/8X2-3/4 SM (GAUZE/BANDAGES/DRESSINGS) ×4 IMPLANT
DRSG TEGADERM 4X4.75 (GAUZE/BANDAGES/DRESSINGS) ×4 IMPLANT
ELECT PENCIL ROCKER SW 15FT (MISCELLANEOUS) ×4 IMPLANT
ELECT REM PT RETURN 15FT ADLT (MISCELLANEOUS) ×4 IMPLANT
ENDOLOOP SUT PDS II  0 18 (SUTURE)
ENDOLOOP SUT PDS II 0 18 (SUTURE) IMPLANT
EVACUATOR SILICONE 100CC (DRAIN) ×6 IMPLANT
GAUZE 4X4 16PLY RFD (DISPOSABLE) ×4 IMPLANT
GAUZE SPONGE 2X2 8PLY STRL LF (GAUZE/BANDAGES/DRESSINGS) ×2 IMPLANT
GAUZE SPONGE 4X4 12PLY STRL (GAUZE/BANDAGES/DRESSINGS) IMPLANT
GLOVE ECLIPSE 8.0 STRL XLNG CF (GLOVE) ×20 IMPLANT
GLOVE INDICATOR 8.0 STRL GRN (GLOVE) ×20 IMPLANT
GOWN STRL REUS W/TWL XL LVL3 (GOWN DISPOSABLE) ×20 IMPLANT
GRASPER ENDOPATH ANVIL 10MM (MISCELLANEOUS) IMPLANT
GRASPER SUT TROCAR 14GX15 (MISCELLANEOUS) ×4 IMPLANT
HOLDER FOLEY CATH W/STRAP (MISCELLANEOUS) ×4 IMPLANT
IRRIG SUCT STRYKERFLOW 2 WTIP (MISCELLANEOUS) ×4
IRRIGATION SUCT STRKRFLW 2 WTP (MISCELLANEOUS) IMPLANT
IRRIGATOR SUCT 8 DISP DVNC XI (IRRIGATION / IRRIGATOR) IMPLANT
IRRIGATOR SUCTION 8MM XI DISP (IRRIGATION / IRRIGATOR)
KIT BASIN OR (CUSTOM PROCEDURE TRAY) ×4 IMPLANT
KIT PROCEDURE DA VINCI SI (MISCELLANEOUS) ×2
KIT PROCEDURE DVNC SI (MISCELLANEOUS) ×2 IMPLANT
LOOP VESSEL MAXI BLUE (MISCELLANEOUS) IMPLANT
LUBRICANT JELLY K Y 4OZ (MISCELLANEOUS) ×4 IMPLANT
NDL INSUFFLATION 14GA 120MM (NEEDLE) ×2 IMPLANT
NEEDLE HYPO 22GX1.5 SAFETY (NEEDLE) ×4 IMPLANT
NEEDLE INSUFFLATION 14GA 120MM (NEEDLE) ×4 IMPLANT
PACK BASIC VI WITH GOWN DISP (CUSTOM PROCEDURE TRAY) ×4 IMPLANT
PACK CARDIOVASCULAR III (CUSTOM PROCEDURE TRAY) ×4 IMPLANT
PACK COLON (CUSTOM PROCEDURE TRAY) ×4 IMPLANT
PAD POSITIONING PINK XL (MISCELLANEOUS) ×4 IMPLANT
PLUG CATH AND CAP STER (CATHETERS) ×2 IMPLANT
PORT LAP GEL ALEXIS MED 5-9CM (MISCELLANEOUS) ×4 IMPLANT
POUCH RETRIEVAL ECOSAC 10 (ENDOMECHANICALS) IMPLANT
POUCH RETRIEVAL ECOSAC 10MM (ENDOMECHANICALS) ×2
RELOAD STAPLE 45 BLU REG DVNC (STAPLE) IMPLANT
RELOAD STAPLE 45 GRN THCK DVNC (STAPLE) IMPLANT
RETRACTOR WND ALEXIS 18 MED (MISCELLANEOUS) IMPLANT
RTRCTR WOUND ALEXIS 18CM MED (MISCELLANEOUS)
SCISSORS LAP 5X35 DISP (ENDOMECHANICALS) ×4 IMPLANT
SEAL CANN UNIV 5-8 DVNC XI (MISCELLANEOUS) ×6 IMPLANT
SEAL XI 5MM-8MM UNIVERSAL (MISCELLANEOUS) ×6
SEALER VESSEL DA VINCI XI (MISCELLANEOUS) ×2
SEALER VESSEL EXT DVNC XI (MISCELLANEOUS) ×2 IMPLANT
SHEARS HARMONIC 9CM CVD (BLADE) IMPLANT
SLEEVE ADV FIXATION 5X100MM (TROCAR) ×4 IMPLANT
SOLUTION ELECTROLUBE (MISCELLANEOUS) ×4 IMPLANT
SPONGE GAUZE 2X2 STER 10/PKG (GAUZE/BANDAGES/DRESSINGS) ×2
STAPLER 45 BLU RELOAD XI (STAPLE) IMPLANT
STAPLER 45 BLUE RELOAD XI (STAPLE)
STAPLER 45 GREEN RELOAD XI (STAPLE)
STAPLER 45 GRN RELOAD XI (STAPLE) IMPLANT
STAPLER CANNULA SEAL DVNC XI (STAPLE) ×2 IMPLANT
STAPLER CANNULA SEAL XI (STAPLE) ×2
STAPLER SHEATH (SHEATH) ×2
STAPLER SHEATH ENDOWRIST DVNC (SHEATH) ×2 IMPLANT
SUT CHROMIC 2 0 SH (SUTURE) ×4 IMPLANT
SUT CHROMIC 3 0 SH 27 (SUTURE) IMPLANT
SUT MNCRL AB 4-0 PS2 18 (SUTURE) ×4 IMPLANT
SUT PDS AB 1 CTX 36 (SUTURE) IMPLANT
SUT PDS AB 1 TP1 96 (SUTURE) IMPLANT
SUT PDS AB 2-0 CT2 27 (SUTURE) IMPLANT
SUT PROLENE 0 CT 2 (SUTURE) ×4 IMPLANT
SUT PROLENE 2 0 KS (SUTURE) IMPLANT
SUT PROLENE 2 0 SH DA (SUTURE) ×4 IMPLANT
SUT SILK 2 0 (SUTURE) ×4
SUT SILK 2 0 SH CR/8 (SUTURE) ×4 IMPLANT
SUT SILK 2-0 18XBRD TIE 12 (SUTURE) ×2 IMPLANT
SUT SILK 3 0 (SUTURE) ×4
SUT SILK 3 0 SH CR/8 (SUTURE) ×4 IMPLANT
SUT SILK 3-0 18XBRD TIE 12 (SUTURE) ×2 IMPLANT
SUT V-LOC BARB 180 2/0GR6 GS22 (SUTURE)
SUT VIC AB 2-0 SH 27 (SUTURE)
SUT VIC AB 2-0 SH 27X BRD (SUTURE) IMPLANT
SUT VIC AB 2-0 UR6 27 (SUTURE) ×24 IMPLANT
SUT VIC AB 3-0 SH 18 (SUTURE) ×4 IMPLANT
SUT VIC AB 3-0 SH 27 (SUTURE) ×4
SUT VIC AB 3-0 SH 27XBRD (SUTURE) ×2 IMPLANT
SUT VICRYL 0 UR6 27IN ABS (SUTURE) ×4 IMPLANT
SUTURE V-LC BRB 180 2/0GR6GS22 (SUTURE) IMPLANT
SYR 10ML LL (SYRINGE) ×4 IMPLANT
SYR 20CC LL (SYRINGE) ×4 IMPLANT
SYR 3ML LL SCALE MARK (SYRINGE) IMPLANT
SYS LAPSCP GELPORT 120MM (MISCELLANEOUS)
SYSTEM LAPSCP GELPORT 120MM (MISCELLANEOUS) IMPLANT
TAPE UMBILICAL COTTON 1/8X30 (MISCELLANEOUS) ×4 IMPLANT
TOWEL OR 17X26 10 PK STRL BLUE (TOWEL DISPOSABLE) ×4 IMPLANT
TOWEL OR NON WOVEN STRL DISP B (DISPOSABLE) ×4 IMPLANT
TRAY FOLEY CATH 14FRSI W/METER (CATHETERS) ×2 IMPLANT
TROCAR ADV FIXATION 5X100MM (TROCAR) ×4 IMPLANT
TUBING CONNECTING 10 (TUBING) ×6 IMPLANT
TUBING CONNECTING 10' (TUBING) ×2
TUBING INSUFFLATION 10FT LAP (TUBING) ×4 IMPLANT
YANKAUER SUCT BULB TIP 10FT TU (MISCELLANEOUS) ×4 IMPLANT

## 2018-02-26 NOTE — Interval H&P Note (Signed)
History and Physical Interval Note:  02/26/2018 1:08 PM  Stephanie [name]  has presented today for surgery, with the diagnosis of rectal cyst  The various methods of treatment have been discussed with the patient and family. After consideration of risks, benefits and other options for treatment, the patient has consented to  Procedure(s): XI ROBOTIC ASSISTED LOWER ANTERIOR RESECTION (N/A) EXAM UNDER ANESTHESIA WITH HEMORRHOIDECTOMY (N/A) as a surgical intervention .    I have re-reviewed the the patient's records, history, medications, and allergies.  I have re-examined the patient.  I again discussed intraoperative plans and goals of post-operative recovery.  The patient agrees to proceed.  Stephanie [name]  1981-01-13 [id number]  Patient Care Team: Sherene Sires, DO as PCP - General (Family Medicine) Ileana Roup, MD as Consulting Physician (Colon and Rectal Surgery)  Patient Active Problem List   Diagnosis Date Noted  . Panic attacks     Priority: High  . PTSD (post-traumatic stress disorder)     Priority: High  . Schizophrenia ([place])     Priority: Medium  . Constipation, chronic 02/20/2018    Priority: Medium  . 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    Priority: Medium  . Hypokalemia 02/25/2018  . Hypomagnesemia 02/25/2018  . Panic disorder 02/22/2018  . Pelvic pain 02/21/2018  . Hypertension   . Sweaty palms 10/25/2017  . Encounter to establish care 10/25/2017  . Depression 10/25/2017  . Internal hemorrhoids with complication 30/16/0109    Past Medical History:  Diagnosis Date  . Anemia   . Depression   . Gestational diabetes mellitus    gestational only  . H/O cesarean section 2010  . Headache    migraines  . Hemorrhoids   . Hypertension   . Panic attacks   . PTSD (post-traumatic stress disorder)   . Schizophrenia ([place])   . Vaginal delivery 2004 , 2008, 2009    Past Surgical History:  Procedure Laterality Date  . CESAREAN SECTION    .  COLONOSCOPY WITH PROPOFOL  08/19/2017   Procedure: COLONOSCOPY WITH PROPOFOL;  Surgeon: Ileana Roup, MD;  Location: [facility];  Service: General;;  . COLONOSCOPY WITH PROPOFOL N/A 02/21/2018   Procedure: COLONOSCOPY WITH PROPOFOL;  Surgeon: Irene Shipper, MD;  Location: [facility];  Service: Endoscopy;  Laterality: N/A;  . FLEXIBLE SIGMOIDOSCOPY  09/25/2017   Procedure: FLEXIBLE SIGMOIDOSCOPY;  Surgeon: Ileana Roup, MD;  Location: [facility];  Service: General;;  . i and d perirectal abcess  2016  . INCISION AND DRAINAGE PERIRECTAL ABSCESS N/A 05/28/2015   Procedure: IRRIGATION AND DEBRIDEMENT PERIRECTAL ABSCESS;  Surgeon: Armandina Gemma, MD;  Location: [facility];  Service: General;  Laterality: N/A;  . IR RADIOLOGIST EVAL & MGMT  04/02/2017  . TUBAL LIGATION  2010    Social History   Socioeconomic History  . Marital status: Single    Spouse name: Not on file  . Number of children: Not on file  . Years of education: Not on file  . Highest education level: Not on file  Occupational History  . Not on file  Social Needs  . Financial resource strain: Not on file  . Food insecurity:    Worry: Not on file    Inability: Not on file  . Transportation needs:    Medical: Not on file    Non-medical: Not on file  Tobacco Use  . Smoking status: Current Every Day Smoker    Packs/day: 0.50    Types: Cigarettes  .  Smokeless tobacco: Never Used  Substance and Sexual Activity  . Alcohol use: Yes    Comment: ocassionally  . Drug use: Yes    Frequency: 3.0 times per week    Types: Marijuana    Comment: occasionally  . Sexual activity: Yes  Lifestyle  . Physical activity:    Days per week: Not on file    Minutes per session: Not on file  . Stress: Not on file  Relationships  . Social connections:    Talks on phone: Not on file    Gets together: Not on file    Attends religious service: Not on file    Active member of club or organization: Not on file    Attends meetings  of clubs or organizations: Not on file    Relationship status: Not on file  . Intimate partner violence:    Fear of current or ex partner: Not on file    Emotionally abused: Not on file    Physically abused: Not on file    Forced sexual activity: Not on file  Other Topics Concern  . Not on file  Social History Narrative  . Not on file    Family History  Problem Relation Age of Onset  . Hypertension Mother   . Hypertension Other     Facility-Administered Medications Prior to Admission  Medication Dose Route Frequency Provider Last Rate Last Dose  . lidocaine (XYLOCAINE) 5 % ointment   Topical QID PRN Coralie Keens, MD       Medications Prior to Admission  Medication Sig Dispense Refill Last Dose  . amLODipine (NORVASC) 5 MG tablet TK 1 T PO EACH DAY  1 02/26/2018 at 0937  . Aspirin-Salicylamide-Caffeine (BC HEADACHE POWDER PO) Take 1 packet daily as needed by mouth (headaches).   02/18/2018 at Unknown time  . celecoxib (CELEBREX) 200 MG capsule Take 200 mg by mouth daily.  3 02/26/2018 at 0937  . cyclobenzaprine (FLEXERIL) 10 MG tablet TK ONE T PO D PRN FOR SPASMS  0 Past Week at Unknown time  . meloxicam (MOBIC) 15 MG tablet Take 15 mg by mouth daily as needed (for pain/inflammation.).   Past Week at Unknown time  . SUMAtriptan (IMITREX) 25 MG tablet Take 25 mg every 2 (two) hours as needed by mouth for migraine.  0 More than a month at Unknown time    Current Facility-Administered Medications  Medication Dose Route Frequency Provider Last Rate Last Dose  . [MAR Hold] 0.9 %  sodium chloride infusion  250 mL Intravenous PRN Michael Boston, MD   Stopped at 02/26/18 1119  . [MAR Hold] acetaminophen (TYLENOL) tablet 1,000 mg  1,000 mg Oral Q8H SimaanDarci Current, PA-C   1,000 mg at 02/26/18 0557  . acetaminophen (TYLENOL) tablet 1,000 mg  1,000 mg Oral On Call to OR Michael Boston, MD      . Doug Sou Hold] acetaminophen (TYLENOL) tablet 325-650 mg  325-650 mg Oral Q6H PRN Michael Boston,  MD      . Doug Sou Hold] amLODipine (NORVASC) tablet 5 mg  5 mg Oral Daily Jill Alexanders, PA-C   5 mg at 02/26/18 3903  . [MAR Hold] bupivacaine liposome (EXPAREL) 1.3 % injection 266 mg  20 mL Infiltration Once Michael Boston, MD      . Doug Sou Hold] cefoTEtan in Dextrose 5% (CEFOTAN) IVPB 2 g  2 g Intravenous On Call to OR Michael Boston, MD      . celecoxib (CELEBREX) capsule 200 mg  200 mg Oral On Call to OR Michael Boston, MD      . Doug Sou Hold] celecoxib (CELEBREX) capsule 200 mg  200 mg Oral Daily Michael Boston, MD   200 mg at 02/26/18 0937  . [MAR Hold] citalopram (CELEXA) tablet 10 mg  10 mg Oral Daily Akintayo, Mojeed, MD   10 mg at 02/26/18 0936  . [MAR Hold] clindamycin (CLEOCIN) 900 mg, gentamicin (GARAMYCIN) 240 mg in sodium chloride 0.9 % 1,000 mL for intraperitoneal lavage   Intraperitoneal To OR Michael Boston, MD      . Doug Sou Hold] cyclobenzaprine (FLEXERIL) tablet 10 mg  10 mg Oral TID PRN Michael Boston, MD   10 mg at 02/24/18 2152  . dextrose 5 % and 0.45 % NaCl with KCl 20 mEq/L infusion   Intravenous Continuous Michael Boston, MD   Stopped at 02/26/18 1120  . [MAR Hold] diphenhydrAMINE (BENADRYL) capsule 25 mg  25 mg Oral Q6H PRN Jill Alexanders, PA-C   25 mg at 02/24/18 0736   Or  . Doug Sou Hold] diphenhydrAMINE (BENADRYL) injection 25 mg  25 mg Intravenous Q6H PRN Jill Alexanders, PA-C      . [MAR Hold] enoxaparin (LOVENOX) injection 40 mg  40 mg Subcutaneous Q24H Meuth, Brooke A, PA-C      . gabapentin (NEURONTIN) capsule 300 mg  300 mg Oral On Call to OR Michael Boston, MD      . Doug Sou Hold] gabapentin (NEURONTIN) capsule 300 mg  300 mg Oral BID Michael Boston, MD   300 mg at 02/26/18 0937  . [MAR Hold] hydrALAZINE (APRESOLINE) injection 10 mg  10 mg Intravenous Q2H PRN Jill Alexanders, PA-C      . [MAR Hold] hydrocortisone-pramoxine (ANALPRAM-HC) 2.5-1 % rectal cream 1 application  1 application Rectal QID Michael Boston, MD   1 application at 64/33/29 0941  . [MAR Hold]  hydrOXYzine (ATARAX/VISTARIL) tablet 10 mg  10 mg Oral BID Corena Pilgrim, MD   10 mg at 02/26/18 0945  . lactated ringers infusion   Intravenous Continuous Albertha Ghee, MD 50 mL/hr at 02/26/18 1220    . [MAR Hold] magnesium citrate solution 1 Bottle  1 Bottle Oral BID Michael Boston, MD   1 Bottle at 02/25/18 1804  . [MAR Hold] methocarbamol (ROBAXIN) 1,000 mg in dextrose 5 % 50 mL IVPB  1,000 mg Intravenous Q6H PRN Michael Boston, MD      . Doug Sou Hold] morphine 2 MG/ML injection 1 mg  1 mg Intravenous Q2H PRN Jill Alexanders, PA-C   1 mg at 02/26/18 0936  . [MAR Hold] ondansetron (ZOFRAN-ODT) disintegrating tablet 4 mg  4 mg Oral Q6H PRN Jill Alexanders, PA-C       Or  . [MAR Hold] ondansetron (ZOFRAN) injection 4 mg  4 mg Intravenous Q6H PRN Jill Alexanders, PA-C   4 mg at 02/25/18 1012  . [MAR Hold] oxyCODONE (Oxy IR/ROXICODONE) immediate release tablet 5-10 mg  5-10 mg Oral Q4H PRN Jill Alexanders, PA-C   10 mg at 02/25/18 2115  . [MAR Hold] piperacillin-tazobactam (ZOSYN) IVPB 3.375 g  3.375 g Intravenous Q8H Berton Mount, RPH   Stopped at 02/26/18 1027  . [MAR Hold] potassium chloride SA (K-DUR,KLOR-CON) CR tablet 40 mEq  40 mEq Oral BID Michael Boston, MD   40 mEq at 02/26/18 0937  . [MAR Hold] senna-docusate (Senokot-S) tablet 1 tablet  1 tablet Oral BID Michael Boston, MD   1 tablet at 02/26/18 [phone]  .  sodium chloride 0.9 % with cefoTEtan (CEFOTAN) ADS Med           . [DUK Hold] sodium chloride flush (NS) 0.9 % injection 3 mL  3 mL Intravenous Gorden Harms, MD   3 mL at 02/26/18 0941  . [MAR Hold] sodium chloride flush (NS) 0.9 % injection 3 mL  3 mL Intravenous PRN Michael Boston, MD      . Doug Sou Hold] SUMAtriptan (IMITREX) tablet 25 mg  25 mg Oral Q2H PRN Michael Boston, MD      . Doug Sou Hold] witch hazel-glycerin (TUCKS) pad 1 application  1 application Topical PRN Alano Blasco, Remo Lipps, MD         No Known Allergies  BP 101/84 (BP Location: Left Arm)   Pulse 70    Temp 98.2 F (36.8 C) (Oral)   Resp 17   Ht '5\' 6"'  (1.676 m)   Wt 68 kg (150 lb)   LMP 02/19/2018 (Exact Date) Comment: negative HCG blood test 02-19-2018  SpO2 99%   BMI 24.21 kg/m   Labs: Results for orders placed or performed during the hospital encounter of 02/19/18 (from the past 48 hour(s))  Prealbumin     Status: Abnormal   Collection Time: 02/25/18  4:13 AM  Result Value Ref Range   Prealbumin 10.2 (L) 18 - 38 mg/dL    Comment: Performed at [facility], [place] [address], [place], [place] [address]  CBC     Status: Abnormal   Collection Time: 02/25/18  4:13 AM  Result Value Ref Range   WBC 9.3 4.0 - 10.5 K/uL   RBC 3.08 (L) 3.87 - 5.11 MIL/uL   Hemoglobin 10.1 (L) 12.0 - 15.0 g/dL   HCT 31.6 (L) 36.0 - 46.0 %   MCV 102.6 (H) 78.0 - 100.0 fL   MCH 32.8 26.0 - 34.0 pg   MCHC 32.0 30.0 - 36.0 g/dL   RDW 12.4 11.5 - 15.5 %   Platelets 250 150 - 400 K/uL    Comment: Performed at [facility], [place] [address], [place], [place] [address]  Basic metabolic panel     Status: Abnormal   Collection Time: 02/25/18  4:13 AM  Result Value Ref Range   Sodium 141 135 - 145 mmol/L   Potassium 2.6 (LL) 3.5 - 5.1 mmol/L    Comment: CRITICAL RESULT CALLED TO, READ BACK BY AND VERIFIED WITH: C TIPOPEN RN 0503 02/25/18 A NAVARRO    Chloride 100 (L) 101 - 111 mmol/L   CO2 28 22 - 32 mmol/L   Glucose, Bld 82 65 - 99 mg/dL   BUN 7 6 - 20 mg/dL   Creatinine, Ser 0.51 0.44 - 1.00 mg/dL   Calcium 8.6 (L) 8.9 - 10.3 mg/dL   GFR calc non Af Amer >60 >60 mL/min   GFR calc Af Amer >60 >60 mL/min    Comment: (NOTE) The eGFR has been calculated using the CKD EPI equation. This calculation has not been validated in all clinical situations. eGFR's persistently <60 mL/min signify possible Chronic Kidney Disease.    Anion gap 13 5 - 15    Comment: Performed at [facility], [place] [address], [place], [place] [address]  Magnesium     Status: Abnormal    Collection Time: 02/25/18  4:13 AM  Result Value Ref Range   Magnesium 1.6 (L) 1.7 - 2.4 mg/dL    Comment: Performed at [facility], [place] [address] [place],  [place] [address]  MRSA PCR Screening     Status: None   Collection Time: 02/25/18 10:23 PM  Result Value Ref Range   MRSA by PCR NEGATIVE NEGATIVE    Comment:        The GeneXpert MRSA Assay (FDA approved for NASAL specimens only), is one component of a comprehensive MRSA colonization surveillance program. It is not intended to diagnose MRSA infection nor to guide or monitor treatment for MRSA infections. Performed at [facility], [place] [address], [place], [place] [address]   Creatinine, serum     Status: None   Collection Time: 02/26/18  4:38 AM  Result Value Ref Range   Creatinine, Ser 0.48 0.44 - 1.00 mg/dL   GFR calc non Af Amer >60 >60 mL/min   GFR calc Af Amer >60 >60 mL/min    Comment: (NOTE) The eGFR has been calculated using the CKD EPI equation. This calculation has not been validated in all clinical situations. eGFR's persistently <60 mL/min signify possible Chronic Kidney Disease. Performed at [facility], [place] [address] [place], [place] [address]   Magnesium     Status: None   Collection Time: 02/26/18  4:38 AM  Result Value Ref Range   Magnesium 2.1 1.7 - 2.4 mg/dL    Comment: Performed at [facility], [place] [address] [place], [place] [address]  Potassium     Status: None   Collection Time: 02/26/18  4:38 AM  Result Value Ref Range   Potassium 4.1 3.5 - 5.1 mmol/L    Comment: NO VISIBLE HEMOLYSIS DELTA CHECK NOTED REPEATED TO VERIFY Performed at [place] [address], [place], [place] [address]   Type and screen [place]     Status: None (Preliminary result)   Collection Time: 02/26/18 12:20 PM  Result Value Ref Range   ABO/RH(D) O POS    Antibody Screen PENDING     Sample Expiration      03/01/2018 Performed at [facility], [place] [address] [place], [place] [address]     Imaging / Studies: Ct Abdomen Pelvis W Contrast  Result Date: 02/19/2018 CLINICAL DATA:  Constipation and rectal pain, history of previous rectal abscess EXAM: CT ABDOMEN AND PELVIS WITH CONTRAST TECHNIQUE: Multidetector CT imaging of the abdomen and pelvis was performed using the standard protocol following bolus administration of intravenous contrast. CONTRAST:  179m ISOVUE-300 IOPAMIDOL (ISOVUE-300) INJECTION 61% COMPARISON:  07/16/2017, 08/19/2017 FINDINGS: Lower chest: No acute abnormality. Hepatobiliary: No focal liver abnormality is seen. No gallstones, gallbladder wall thickening, or biliary dilatation. Pancreas: Unremarkable. No pancreatic ductal dilatation or surrounding inflammatory changes. Spleen: Normal in size without focal abnormality. Adrenals/Urinary Tract: Adrenal glands are unremarkable. Kidneys are normal, without renal calculi, focal lesion, or hydronephrosis. Bladder is unremarkable. Stomach/Bowel: Adjacent to the distal aspect of the rectum on the left, there is a large complex appearing fluid collection measuring 7.7 x 6.2 x 7.1 cm in greatest dimension. This is significantly enlarged when compared with the prior MRI examination. This causes significant mass effect upon the adjacent rectum with displacement to the right. The previously seen enhancing nodules anterior to the distal sacrum are again seen and stable. Additionally the rectosigmoid is filled with fluid likely related to difficulty passing stool. No other obstructive changes are seen. Vascular/Lymphatic: No significant vascular findings are present. No enlarged abdominal or pelvic lymph nodes. Reproductive: Uterus and bilateral adnexa are unremarkable. Other: No free fluid is noted.  No  definitive hernia is identified. Musculoskeletal: No acute or significant osseous findings. IMPRESSION: Large  complex cystic collection identified in the left perirectal region increased in size when compared with the prior exam. This causes mass effect upon the adjacent rectum and some backup of fluid and stool within the distal colon. The adjacent enhancing nodules anterior to the sacrum are again seen and stable. No other focal abnormality is noted. Electronically Signed   By: Inez Catalina M.D.   On: 02/19/2018 15:22     .Adin Hector, M.D., F.A.C.S. Gastrointestinal and Minimally Invasive Surgery [facility] [address], [place], [place] [address] [phone] Main / Paging  02/26/2018 1:08 PM  The patient's history has been reviewed, patient examined, no change in status, stable for surgery.  I have reviewed the patient's chart and labs.  Questions were answered to the patient's satisfaction.     Adin Hector

## 2018-02-26 NOTE — Anesthesia Procedure Notes (Signed)
Procedure Name: Intubation Date/Time: 02/26/2018 2:06 PM Performed by: Mitzie Na, CRNA Pre-anesthesia Checklist: Patient identified, Emergency Drugs available, Suction available and Timeout performed Patient Re-evaluated:Patient Re-evaluated prior to induction Oxygen Delivery Method: Circle system utilized Preoxygenation: Pre-oxygenation with 100% oxygen Induction Type: IV induction Ventilation: Mask ventilation without difficulty Laryngoscope Size: Mac and 3 Grade View: Grade I Tube type: Oral Tube size: 7.0 mm Number of attempts: 1 Airway Equipment and Method: Stylet Placement Confirmation: ETT inserted through vocal cords under direct vision and positive ETCO2 Secured at: 22 cm Tube secured with: Tape Dental Injury: Teeth and Oropharynx as per pre-operative assessment

## 2018-02-26 NOTE — Discharge Instructions (Signed)
DRAIN CARE:   You have a closed bulb drain to help you heal.    A bulb drain is a small, plastic reservoir which creates a gentle suction. It is used to remove excess fluid from a surgical wound. The color and amount of fluid will vary. Immediately after surgery, the fluid is bright red. It may gradually change to a yellow color. When the amount decreases to about 1 or 2 tablespoons (15 to 30 cc) per 24 hours, your caregiver will usually remove it.  JP Care  The Jackson-Pratt drainage system has flexible tubing attached to a soft, plastic bulb with a stopper. The drainage end of the tubing, which is flat and white, goes into your body through a small opening near your incision (surgical cut). A stitch holds the drainage end in place. The rest of the tube is outside your body, attached to the bulb. When the bulb is compressed with the stopper in place, it creates a vacuum. This causes a constant gentle suction, which helps draw out fluid that collects under your incision. The bulb should be compressed at all times, except when you are emptying the drainage.  How long you will have your Jackson-Pratt depends on your surgery and the amount of fluid is draining. This is different for everyone. The Jackson-Pratt is usually removed when the drainage is 30 mL or less over 24 hours. To keep track of how much drainage youre having, you will record the amount in a drainage log. Its important to bring the log with you to your follow-up appointments.  Caring for Your Jackson-Pratt at Home In order to care for your Jackson-Pratt at home, you or your caregiver will do the following:  Empty the drain once a day and record the color and amount of drainage  Care for the area where the tubing enters your skin by washing with soap and water.  Milk the tubing to help move clots into the bulb.  Do this before you empty and measure your drainage. Look in the mirror at the tubing. This will help you see where your  hands need to be. Pinch the tubing close to where it goes into your skin between your thumb and forefinger. With the thumb and forefinger of your other hand, pinch the tubing right below your other fingers. Keep your fingers pinched and slide them down the tubing, pushing any clots down toward the bulb. You may want to use alcohol swabs to help you slide your fingers down the tubing. Repeat steps 3 and 4 as necessary to push clots from the tubing into the bulb. If you are not able to move a clot into the bulb, call your doctors office. The fluid may leak around the insertion site if a clot is blocking the drainage flow. If there is fluid in the bulb and no leakage at the insertion site, the drain is working.  How to Empty Your Jackson-Pratt and Record the Drainage You will need to empty your Jackson-Pratt every day  Gather the following supplies:  Measuring container your nurse gave you Jackson-Pratt Drainage Record  Pen or pencil  Instructions Clean an area to work on. Clean your hands thoroughly. Unplug the stopper on top of your Jackson-Pratt. This will cause the bulb to expand. Do not touch the inside of the stopper or the inner area of the opening on the bulb. Turn your Jackson-Pratt upside down, gently squeeze the bulb, and pour the drainage into the measuring container. Turn your Jackson-Pratt right  side up. Squeeze the bulb until your fingers feel the palm of your hand. Keep squeezing the bulb while you replug the stopper. Make sure the bulb stays fully compressed to ensure constant, gentle suction.    Check the amount and color of drainage in the measuring container. The first couple days after surgery the fluid may be dark red. This is normal. As you heal the fluid may look pink or pale yellow. Record this amount and the color of drainage on your Jackson-Pratt Drainage Record. Flush the drainage down the toilet and rinse the measuring container with water.  Caring for the  Insertion Site  Once you have emptied the drainage, clean your hands again. Check the area around the insertion site. Look for tenderness, swelling, or pus. If you have any of these, or if you have a temperature of 101 F (38.3 C) or higher, you may have an infection. Call your doctors office.  Sometimes, the drain causes redness the size of a dime at your insertion site. This is normal. Your healthcare provider will tell you if you should place a bandage over the insertion site.  Wash drain site with soap & water (dilute hydrogen peroxide PRN) daily & replace clean dressing / tape    DAILY CARE  Keep the bulb compressed at all times, except while emptying it. The compression creates suction.   Keep sites where the tubes enter the skin dry and covered with a light bandage (dressing).   Tape the tubes to your skin, 1 to 2 inches below the insertion sites, to keep from pulling on your stitches. Tubes are stitched in place and will not slip out.   Pin the bulb to your shirt (not to your pants) with a safety pin.   For the first few days after surgery, there usually is more fluid in the bulb. Empty the bulb whenever it becomes half full because the bulb does not create enough suction if it is too full. Include this amount in your 24 hour totals.   When the amount of drainage decreases, empty the bulb at the same time every day. Write down the amounts and the 24 hour totals. Your caregiver will want to know them. This helps your caregiver know when the tubes can be removed.   (We anticipate removing the drain in 1-3 weeks, depending on when the output is <12m a day for 2+ days)  If there is drainage around the tube sites, change dressings and keep the area dry. If you see a clot in the tube, leave it alone. However, if the tube does not appear to be draining, let your caregiver know.  TO EMPTY THE BULB  Open the stopper to release suction.   Holding the stopper out of the way, pour  drainage into the measuring cup that was sent home with you.   Measure and write down the amount. If there are 2 bulbs, note the amount of drainage from bulb 1 or bulb 2 and keep the totals separate. Your caregiver will want to know which tube is draining more.   Compress the bulb by folding it in half.   Replace the stopper.   Check the tape that holds the tube to your skin, and pin the bulb to your shirt.  SEEK MEDICAL CARE IF:  The drainage develops a bad odor.   You have an oral temperature above 102 F (38.9 C).   The amount of drainage from your wound suddenly increases or decreases.  You accidentally pull out your drain.   You have any other questions or concerns.  MAKE SURE YOU:   Understand these instructions.   Will watch your condition.   Will get help right away if you are not doing well or get worse.     Call our office if you have any questions about your drain. [phone]  SURGERY: POST OP INSTRUCTIONS (Surgery for small bowel obstruction, colon resection, etc)   ######################################################################  EAT Gradually transition to a high fiber diet with a fiber supplement over the next few days after discharge  WALK Walk an hour a day.  Control your pain to do that.    CONTROL PAIN Control pain so that you can walk, sleep, tolerate sneezing/coughing, go up/down stairs.  HAVE A BOWEL MOVEMENT DAILY Keep your bowels regular to avoid problems.  OK to try a laxative to override constipation.  OK to use an antidairrheal to slow down diarrhea.  Call if not better after 2 tries  CALL IF YOU HAVE PROBLEMS/CONCERNS Call if you are still struggling despite following these instructions. Call if you have concerns not answered by these instructions  ######################################################################   DIET Follow a light diet the first few days at home.  Start with a bland diet such as soups, liquids,  starchy foods, low fat foods, etc.  If you feel full, bloated, or constipated, stay on a ful liquid or pureed/blenderized diet for a few days until you feel better and no longer constipated. Be sure to drink plenty of fluids every day to avoid getting dehydrated (feeling dizzy, not urinating, etc.). Gradually add a fiber supplement to your diet over the next week.  Gradually get back to a regular solid diet.  Avoid fast food or heavy meals the first week as you are more likely to get nauseated. It is expected for your digestive tract to need a few months to get back to normal.  It is common for your bowel movements and stools to be irregular.  You will have occasional bloating and cramping that should eventually fade away.  Until you are eating solid food normally, off all pain medications, and back to regular activities; your bowels will not be normal. Focus on eating a low-fat, high fiber diet the rest of your life (See Getting to Good Bowel Health, below).  CARE of your INCISION or WOUND It is good for closed incision and even open wounds to be washed every day.  Shower every day.  Short baths are fine.  Wash the incisions and wounds clean with soap & water.    If you have a closed incision(s), wash the incision with soap & water every day.  You may leave closed incisions open to air if it is dry.   You may cover the incision with clean gauze & replace it after your daily shower for comfort. If you have skin tapes (Steristrips) or skin glue (Dermabond) on your incision, leave them in place.  They will fall off on their own like a scab.  You may trim any edges that curl up with clean scissors.  If you have staples, set up an appointment for them to be removed in the office in 10 days after surgery.  If you have a drain, wash around the skin exit site with soap & water and place a new dressing of gauze or band aid around the skin every day.  Keep the drain site clean & dry.    If you have an open  wound  with packing, see wound care instructions.  In general, it is encouraged that you remove your dressing and packing, shower with soap & water, and replace your dressing once a day.  Pack the wound with clean gauze moistened with normal (0.9%) saline to keep the wound moist & uninfected.  Pressure on the dressing for 30 minutes will stop most wound bleeding.  Eventually your body will heal & pull the open wound closed over the next few months.  Raw open wounds will occasionally bleed or secrete yellow drainage until it heals closed.  Drain sites will drain a little until the drain is removed.  Even closed incisions can have mild bleeding or drainage the first few days until the skin edges scab over & seal.   If you have an open wound with a wound vac, see wound vac care instructions.     ACTIVITIES as tolerated Start light daily activities --- self-care, walking, climbing stairs-- beginning the day after surgery.  Gradually increase activities as tolerated.  Control your pain to be active.  Stop when you are tired.  Ideally, walk several times a day, eventually an hour a day.   Most people are back to most day-to-day activities in a few weeks.  It takes 4-8 weeks to get back to unrestricted, intense activity. If you can walk 30 minutes without difficulty, it is safe to try more intense activity such as jogging, treadmill, bicycling, low-impact aerobics, swimming, etc. Save the most intensive and strenuous activity for last (Usually 4-8 weeks after surgery) such as sit-ups, heavy lifting, contact sports, etc.  Refrain from any intense heavy lifting or straining until you are off narcotics for pain control.  You will have off days, but things should improve week-by-week. DO NOT PUSH THROUGH PAIN.  Let pain be your guide: If it hurts to do something, don't do it.  Pain is your body warning you to avoid that activity for another week until the pain goes down. You may drive when you are no longer taking  narcotic prescription pain medication, you can comfortably wear a seatbelt, and you can safely make sudden turns/stops to protect yourself without hesitating due to pain. You may have sexual intercourse when it is comfortable. If it hurts to do something, stop.  MEDICATIONS Take your usually prescribed home medications unless otherwise directed.   Blood thinners:  Usually you can restart any strong blood thinners after the second postoperative day.  It is OK to take aspirin right away.     If you are on strong blood thinners (warfarin/Coumadin, Plavix, Xerelto, Eliquis, Pradaxa, etc), discuss with your surgeon, medicine PCP, and/or cardiologist for instructions on when to restart the blood thinner & if blood monitoring is needed (PT/INR blood check, etc).     PAIN CONTROL Pain after surgery or related to activity is often due to strain/injury to muscle, tendon, nerves and/or incisions.  This pain is usually short-term and will improve in a few months.  To help speed the process of healing and to get back to regular activity more quickly, DO THE FOLLOWING THINGS TOGETHER: 1. Increase activity gradually.  DO NOT PUSH THROUGH PAIN 2. Use Ice and/or Heat 3. Try Gentle Massage and/or Stretching 4. Take over the counter pain medication 5. Take Narcotic prescription pain medication for more severe pain  Good pain control = faster recovery.  It is better to take more medicine to be more active than to stay in bed all day to avoid medications. 1.  Increase activity gradually Avoid heavy lifting at first, then increase to lifting as tolerated over the next 6 weeks. Do not push through the pain.  Listen to your body and avoid positions and maneuvers than reproduce the pain.  Wait a few days before trying something more intense Walking an hour a day is encouraged to help your body recover faster and more safely.  Start slowly and stop when getting sore.  If you can walk 30 minutes without stopping or  pain, you can try more intense activity (running, jogging, aerobics, cycling, swimming, treadmill, sex, sports, weightlifting, etc.) Remember: If it hurts to do it, then dont do it! 2. Use Ice and/or Heat You will have swelling and bruising around the incisions.  This will take several weeks to resolve. Ice packs or heating pads (6-8 times a day, 30-60 minutes at a time) will help sooth soreness & bruising. Some people prefer to use ice alone, heat alone, or alternate between ice & heat.  Experiment and see what works best for you.  Consider trying ice for the first few days to help decrease swelling and bruising; then, switch to heat to help relax sore spots and speed recovery. Shower every day.  Short baths are fine.  It feels good!  Keep the incisions and wounds clean with soap & water.   3. Try Gentle Massage and/or Stretching Massage at the area of pain many times a day Stop if you feel pain - do not overdo it 4. Take over the counter pain medication This helps the muscle and nerve tissues become less irritable and calm down faster Choose ONE of the following over-the-counter anti-inflammatory medications: Acetaminophen 500mg  tabs (Tylenol) 1-2 pills with every meal and just before bedtime (avoid if you have liver problems or if you have acetaminophen in you narcotic prescription) Naproxen 220mg  tabs (ex. Aleve, Naprosyn) 1-2 pills twice a day (avoid if you have kidney, stomach, IBD, or bleeding problems) Ibuprofen 200mg  tabs (ex. Advil, Motrin) 3-4 pills with every meal and just before bedtime (avoid if you have kidney, stomach, IBD, or bleeding problems) Take with food/snack several times a day as directed for at least 2 weeks to help keep pain / soreness down & more manageable. 5. Take Narcotic prescription pain medication for more severe pain A prescription for strong pain control is often given to you upon discharge (for example: oxycodone/Percocet, hydrocodone/Norco/Vicodin, or  tramadol/Ultram) Take your pain medication as prescribed. Be mindful that most narcotic prescriptions contain Tylenol (acetaminophen) as well - avoid taking too much Tylenol. If you are having problems/concerns with the prescription medicine (does not control pain, nausea, vomiting, rash, itching, etc.), please call us [phone] to see if we need to switch you to a different pain medicine that will work better for you and/or control your side effects better. If you need a refill on your pain medication, you must call the office before 4 pm and on weekdays only.  By federal law, prescriptions for narcotics cannot be called into a pharmacy.  They must be filled out on paper & picked up from our office by the patient or authorized caretaker.  Prescriptions cannot be filled after 4 pm nor on weekends.    WHEN TO CALL us [phone] Severe uncontrolled or worsening pain  Fever over 101 F (38.5 C) Concerns with the incision: Worsening pain, redness, rash/hives, swelling, bleeding, or drainage Reactions / problems with new medications (itching, rash, hives, nausea, etc.) Nausea and/or vomiting Difficulty urinating Difficulty breathing  Worsening fatigue, dizziness, lightheadedness, blurred vision Other concerns If you are not getting better after two weeks or are noticing you are getting worse, contact our office [phone] for further advice.  We may need to adjust your medications, re-evaluate you in the office, send you to the emergency room, or see what other things we can do to help. The clinic staff is available to answer your questions during regular business hours (8:30am-5pm).  Please dont hesitate to call and ask to speak to one of our nurses for clinical concerns.    A surgeon from [facility] is always on call at the hospitals 24 hours/day If you have a medical emergency, go to the nearest emergency room or call 911.  FOLLOW UP in our office One the day of your  discharge from the hospital (or the next business weekday), please call [facility] to set up or confirm an appointment to see your surgeon in the office for a follow-up appointment.  Usually it is 2-3 weeks after your surgery.   If you have skin staples at your incision(s), let the office know so we can set up a time in the office for the nurse to remove them (usually around 10 days after surgery). Make sure that you call for appointments the day of discharge (or the next business weekday) from the hospital to ensure a convenient appointment time. IF YOU HAVE DISABILITY OR FAMILY LEAVE FORMS, BRING THEM TO THE OFFICE FOR PROCESSING.  DO NOT GIVE THEM TO YOUR DOCTOR.  [facility] [address], [place], [place]  [address] ? [phone] - Main [phone] - Toll Free,  [phone] - Fax [email or web address]  GETTING TO GOOD BOWEL HEALTH. It is expected for your digestive tract to need a few months to get back to normal.  It is common for your bowel movements and stools to be irregular.  You will have occasional bloating and cramping that should eventually fade away.  Until you are eating solid food normally, off all pain medications, and back to regular activities; your bowels will not be normal.   Avoiding constipation The goal: ONE SOFT BOWEL MOVEMENT A DAY!    Drink plenty of fluids.  Choose water first. TAKE A FIBER SUPPLEMENT EVERY DAY THE REST OF YOUR LIFE During your first week back home, gradually add back a fiber supplement every day Experiment which form you can tolerate.   There are many forms such as powders, tablets, wafers, gummies, etc Psyllium bran (Metamucil), methylcellulose (Citrucel), Miralax or Glycolax, Benefiber, Flax Seed.  Adjust the dose week-by-week (1/2 dose/day to 6 doses a day) until you are moving your bowels 1-2 times a day.  Cut back the dose or try a different fiber product if it is giving you  problems such as diarrhea or bloating. Sometimes a laxative is needed to help jump-start bowels if constipated until the fiber supplement can help regulate your bowels.  If you are tolerating eating & you are farting, it is okay to try a gentle laxative such as double dose MiraLax, prune juice, or Milk of Magnesia.  Avoid using laxatives too often. Stool softeners can sometimes help counteract the constipating effects of narcotic pain medicines.  It can also cause diarrhea, so avoid using for too long. If you are still constipated despite taking fiber daily, eating solids, and a few doses of laxatives, call our office. Controlling diarrhea Try drinking liquids and eating bland foods for a few  days to avoid stressing your intestines further. Avoid dairy products (especially milk & ice cream) for a short time.  The intestines often can lose the ability to digest lactose when stressed. Avoid foods that cause gassiness or bloating.  Typical foods include beans and other legumes, cabbage, broccoli, and dairy foods.  Avoid greasy, spicy, fast foods.  Every person has some sensitivity to other foods, so listen to your body and avoid those foods that trigger problems for you. Probiotics (such as active yogurt, Align, etc) may help repopulate the intestines and colon with normal bacteria and calm down a sensitive digestive tract Adding a fiber supplement gradually can help thicken stools by absorbing excess fluid and retrain the intestines to act more normally.  Slowly increase the dose over a few weeks.  Too much fiber too soon can backfire and cause cramping & bloating. It is okay to try and slow down diarrhea with a few doses of antidiarrheal medicines.   Bismuth subsalicylate (ex. Kayopectate, Pepto Bismol) for a few doses can help control diarrhea.  Avoid if pregnant.   Loperamide (Imodium) can slow down diarrhea.  Start with one tablet (2mg ) first.  Avoid if you are having fevers or severe pain.  ILEOSTOMY  PATIENTS WILL HAVE CHRONIC DIARRHEA since their colon is not in use.    Drink plenty of liquids.  You will need to drink even more glasses of water/liquid a day to avoid getting dehydrated. Record output from your ileostomy.  Expect to empty the bag every 3-4 hours at first.  Most people with a permanent ileostomy empty their bag 4-6 times at the least.   Use antidiarrheal medicine (especially Imodium) several times a day to avoid getting dehydrated.  Start with a dose at bedtime & breakfast.  Adjust up or down as needed.  Increase antidiarrheal medications as directed to avoid emptying the bag more than 8 times a day (every 3 hours). Work with your wound ostomy nurse to learn care for your ostomy.  See ostomy care instructions. TROUBLESHOOTING IRREGULAR BOWELS 1) Start with a soft & bland diet. No spicy, greasy, or fried foods.  2) Avoid gluten/wheat or dairy products from diet to see if symptoms improve. 3) Miralax 17gm or flax seed mixed in 8oz. water or juice-daily. May use 2-4 times a day as needed. 4) Gas-X, Phazyme, etc. as needed for gas & bloating.  5) Prilosec (omeprazole) over-the-counter as needed 6)  Consider probiotics (Align, Activa, etc) to help calm the bowels down  Call your doctor if you are getting worse or not getting better.  Sometimes further testing (cultures, endoscopy, X-ray studies, CT scans, bloodwork, etc.) may be needed to help diagnose and treat the cause of the diarrhea. [facility] [address], [address], [place], [place]  [address] [phone] - Main.    [phone]  - Toll Free.   [phone] - Fax [email or web address]  GETTING TO GOOD BOWEL HEALTH.  ######################################################################  EAT Gradually transition to a high fiber diet with a fiber supplement over the next few weeks after discharge.  Start with a pureed / full liquid diet (see below)  WALK Walk an hour a day.   Control your pain to do that.    HAVE A BOWEL MOVEMENT DAILY Keep your bowels regular to avoid problems.  OK to try a laxative to override constipation.  OK to use an antidairrheal to slow down diarrhea.  Call if not better after 2 tries  CALL IF YOU HAVE  PROBLEMS/CONCERNS Call if you are still struggling despite following these instructions. Call if you have concerns not answered by these instructions  ######################################################################   Irregular bowel habits such as constipation and diarrhea can lead to many problems over time.  Having one soft bowel movement a day is the most important way to prevent further problems.  The anorectal canal is designed to handle stretching and feces to safely manage our ability to get rid of solid waste (feces, poop, stool) out of our body.  BUT, hard constipated stools can act like ripping concrete bricks and diarrhea can be a burning fire to this very sensitive area of our body, causing inflamed hemorrhoids, anal fissures, increasing risk is perirectal abscesses, abdominal pain/bloating, an making irritable bowel worse.      The goal: ONE SOFT BOWEL MOVEMENT A DAY!  To have soft, regular bowel movements:   Drink plenty of fluids, consider 4-6 tall glasses of water a day.    Take plenty of fiber.  Fiber is the undigested part of plant food that passes into the colon, acting s natures broom to encourage bowel motility and movement.  Fiber can absorb and hold large amounts of water. This results in a larger, bulkier stool, which is soft and easier to pass. Work gradually over several weeks up to 6 servings a day of fiber (25g a day even more if needed) in the form of: o Vegetables -- Root (potatoes, carrots, turnips), leafy green (lettuce, salad greens, celery, spinach), or cooked high residue (cabbage, broccoli, etc) o Fruit -- Fresh (unpeeled skin & pulp), Dried (prunes, apricots, cherries, etc ),  or stewed ( applesauce)   o Whole grain breads, pasta, etc (whole wheat)  o Bran cereals   Bulking Agents -- This type of water-retaining fiber generally is easily obtained each day by one of the following:  o Psyllium bran -- The psyllium plant is remarkable because its ground seeds can retain so much water. This product is available as Metamucil, Konsyl, Effersyllium, Per Diem Fiber, or the less expensive generic preparation in drug and health food stores. Although labeled a laxative, it really is not a laxative.  o Methylcellulose -- This is another fiber derived from wood which also retains water. It is available as Citrucel. o Polyethylene Glycol - and artificial fiber commonly called Miralax or Glycolax.  It is helpful for people with gassy or bloated feelings with regular fiber o Flax Seed - a less gassy fiber than psyllium  No reading or other relaxing activity while on the toilet. If bowel movements take longer than 5 minutes, you are too constipated  AVOID CONSTIPATION.  High fiber and water intake usually takes care of this.  Sometimes a laxative is needed to stimulate more frequent bowel movements, but   Laxatives are not a good long-term solution as it can wear the colon out.  They can help jump-start bowels if constipated, but should be relied on constantly without discussing with your doctor o Osmotics (Milk of Magnesia, Fleets phosphosoda, Magnesium citrate, MiraLax, GoLytely) are safer than  o Stimulants (Senokot, Castor Oil, Dulcolax, Ex Lax)    o Avoid taking laxatives for more than 7 days in a row.   IF SEVERELY CONSTIPATED, try a Bowel Retraining Program: o Do not use laxatives.  o Eat a diet high in roughage, such as bran cereals and leafy vegetables.  o Drink six (6) ounces of prune or apricot juice each morning.  o Eat two (2) large servings of stewed  fruit each day.  o Take one (1) heaping tablespoon of a psyllium-based bulking agent twice a day. Use sugar-free sweetener when possible to  avoid excessive calories.  o Eat a normal breakfast.  o Set aside 15 minutes after breakfast to sit on the toilet, but do not strain to have a bowel movement.  o If you do not have a bowel movement by the third day, use an enema and repeat the above steps.   Controlling diarrhea o Switch to liquids and simpler foods for a few days to avoid stressing your intestines further. o Avoid dairy products (especially milk & ice cream) for a short time.  The intestines often can lose the ability to digest lactose when stressed. o Avoid foods that cause gassiness or bloating.  Typical foods include beans and other legumes, cabbage, broccoli, and dairy foods.  Every person has some sensitivity to other foods, so listen to our body and avoid those foods that trigger problems for you. o Adding fiber (Citrucel, Metamucil, psyllium, Miralax) gradually can help thicken stools by absorbing excess fluid and retrain the intestines to act more normally.  Slowly increase the dose over a few weeks.  Too much fiber too soon can backfire and cause cramping & bloating. o Probiotics (such as active yogurt, Align, etc) may help repopulate the intestines and colon with normal bacteria and calm down a sensitive digestive tract.  Most studies show it to be of mild help, though, and such products can be costly. o Medicines: - Bismuth subsalicylate (ex. Kayopectate, Pepto Bismol) every 30 minutes for up to 6 doses can help control diarrhea.  Avoid if pregnant. - Loperamide (Immodium) can slow down diarrhea.  Start with two tablets (4mg  total) first and then try one tablet every 6 hours.  Avoid if you are having fevers or severe pain.  If you are not better or start feeling worse, stop all medicines and call your doctor for advice o Call your doctor if you are getting worse or not better.  Sometimes further testing (cultures, endoscopy, X-ray studies, bloodwork, etc) may be needed to help diagnose and treat the cause of the  diarrhea.  TROUBLESHOOTING IRREGULAR BOWELS 1) Avoid extremes of bowel movements (no bad constipation/diarrhea) 2) Miralax 17gm mixed in 8oz. water or juice-daily. May use BID as needed.  3) Gas-x,Phazyme, etc. as needed for gas & bloating.  4) Soft,bland diet. No spicy,greasy,fried foods.  5) Prilosec over-the-counter as needed  6) May hold gluten/wheat products from diet to see if symptoms improve.  7)  May try probiotics (Align, Activa, etc) to help calm the bowels down 7) If symptoms become worse call back immediately.

## 2018-02-26 NOTE — Transfer of Care (Signed)
Immediate Anesthesia Transfer of Care Note  Patient: Stephanie [name]  Procedure(s) Performed: XI ROBOTIC ASSISTED LYSIS OF ADHESIONS, RETRORECTAL RESECTION OF PRESACRAL CYSTIC MASS, FIREFLY INJECTION (N/A Abdomen) EXAM UNDER ANESTHESIA WITH HEMORRHOIDAL LIGATION AND PEXY, EXTERNAL HEMORRHOIDECTOMY X2 (N/A Rectum)  Patient Location: PACU  Anesthesia Type:General  Level of Consciousness: awake, alert  and patient cooperative  Airway & Oxygen Therapy: Patient Spontanous Breathing and Patient connected to face mask oxygen  Post-op Assessment: Report given to RN and Post -op Vital signs reviewed and stable  Post vital signs: Reviewed and stable  Last Vitals:  Vitals Value Taken Time  BP 126/72 02/26/2018  6:41 PM  Temp 37 C 02/26/2018  6:42 PM  Pulse    Resp 17 02/26/2018  6:44 PM  SpO2    Vitals shown include unvalidated device data.  Last Pain:  Vitals:   02/26/18 1349  TempSrc:   PainSc: 0-No pain      Patients Stated Pain Goal: 2 (02/26/18 1349)  Complications: No apparent anesthesia complications

## 2018-02-26 NOTE — Anesthesia Preprocedure Evaluation (Signed)
Anesthesia Evaluation  Patient identified by MRN, date of birth, ID band Patient awake    Reviewed: Allergy & Precautions, H&P , NPO status , Patient's Chart, lab work & pertinent test results  Airway Mallampati: II   Neck ROM: full    Dental   Pulmonary Current Smoker,    breath sounds clear to auscultation       Cardiovascular hypertension,  Rhythm:regular Rate:Normal     Neuro/Psych  Headaches, PSYCHIATRIC DISORDERS Anxiety Depression Schizophrenia    GI/Hepatic Rectal cyst   Endo/Other  diabetes  Renal/GU      Musculoskeletal   Abdominal   Peds  Hematology  (+) anemia ,   Anesthesia Other Findings   Reproductive/Obstetrics                             Anesthesia Physical Anesthesia Plan  ASA: II  Anesthesia Plan: General   Post-op Pain Management:    Induction: Intravenous  PONV Risk Score and Plan: 2 and Ondansetron, Dexamethasone, Midazolam, Scopolamine patch - Pre-op and Treatment may vary due to age or medical condition  Airway Management Planned: Oral ETT  Additional Equipment:   Intra-op Plan:   Post-operative Plan: Extubation in OR  Informed Consent: I have reviewed the patients History and Physical, chart, labs and discussed the procedure including the risks, benefits and alternatives for the proposed anesthesia with the patient or authorized representative who has indicated his/her understanding and acceptance.     Plan Discussed with: CRNA, Anesthesiologist and Surgeon  Anesthesia Plan Comments:         Anesthesia Quick Evaluation

## 2018-02-26 NOTE — Op Note (Signed)
02/26/2018  6:37 PM  PATIENT:  Stephanie [name]  37 y.o. female  Patient Care Team: Marthenia Rolling, DO as PCP - General (Family Medicine) Andria Meuse, MD as Consulting Physician (Colon and Rectal Surgery) Karie Soda, MD as Consulting Physician (General Surgery)  PRE-OPERATIVE DIAGNOSIS:    Retrorectal cyst with recurrent pelvic infections Symptomatic internal/external hemorrhoids  POST-OPERATIVE DIAGNOSIS:   Retrorectal cyst with recurrent pelvic infections Grade 2/3 internal hemorrhoids with irritation Symptomatic external hemorrhoids with pain and irritation.  PROCEDURE:  : XI ROBOTIC ASSISTED LYSIS OF ADHESIONS RESECTION OF RETRORECTAL CYSTIC MASS IN DEEP PELVIS ASSESSMENT OF PERFUSION WITH IMMUNOFLUORESCENT FIREFLY INJECTION EXAM UNDER ANESTHESIA WITH HEMORRHOIDAL LIGATION AND PEXY EXTERNAL HEMORRHOIDECTOMY X2  SURGEON:  Ardeth Sportsman, MD  ASSISTANT: Axel Filler, MD, FACS  ANESTHESIA:   local and general  EBL:  Total I/O In: 1000 [I.V.:1000] Out: 550 [Urine:250; Blood:300]  Delay start of Pharmacological VTE agent (>24hrs) due to surgical blood loss or risk of bleeding:  no  DRAINS: (19Fr ) Blake drain(s) in the PELVIS   SPECIMEN: 1.  Retrorectal/presacral cystic mass  2.  External hemorrhoids x2  DISPOSITION OF SPECIMEN:  PATHOLOGY  COUNTS:  YES  PLAN OF CARE: Admit to inpatient   PATIENT DISPOSITION:  PACU - hemodynamically stable.  INDICATION:    37 year old woman struggling with recurrent pelvic infections.  Work-up concerning for retrorectal/presacral cyst.  Declined outside transferring.  Returned with evidence of pain and discomfort and probable infection.  Discussed with colorectal partners.  I offered robotically assisted retrorectus/presacral resection with removal mass.  Possible need for low anterior resection and colostomy discussed.  Risk benefits alternatives discussed.  Discussed with her 3 times.  Initially very anxious but by  the third visit seemed more confident and ready to try and help tackle this problem.  :  The anatomy & physiology of the digestive tract was discussed.  The pathophysiology was discussed.  Natural history risks without surgery was discussed.   I worked to give an overview of the disease and the frequent need to have multispecialty involvement.  I feel the risks of no intervention will lead to serious problems that outweigh the operative risks; therefore, I recommended surgery to remove the pathology.  Laparoscopic & open techniques were discussed.    Risks such as bleeding, infection, abscess, leak, reoperation, possible ostomy, hernia, heart attack, death, and other risks were discussed.  I noted a good likelihood this will help address the problem.   Goals of post-operative recovery were discussed as well.  We will work to minimize complications.  Educational materials on the pathology had been given in the office.  Questions were answered.    The patient expressed understanding & wished to proceed with surgery.  Patient also with symptomatic hemorrhoids.  Some external.  Some discomfort.  She requested evaluation to see if she would benefit from hemorrhoidal ligation or removal.  Also rule out any fistula since she has had abscesses drained before.  The anatomy & physiology of the anorectal region was discussed.  The pathophysiology of hemorrhoids and differential diagnosis was discussed.  Natural history risks without surgery was discussed.   I stressed the importance of a bowel regimen to have daily soft bowel movements to minimize progression of disease.  Interventions such as sclerotherapy & banding were discussed.  The patient's symptoms are not adequately controlled by medicines and other non-operative treatments.  I feel the risks & problems of no surgery outweigh the operative risks; therefore, I recommended  surgery to treat the hemorrhoids by ligation, pexy, and possible resection.  Risks  such as bleeding, infection, urinary difficulties, injury to other organs, need for repair of tissues / organs, need for further treatment, heart attack, death, and other risks were discussed.   I noted a good likelihood this will help address the problem.  Goals of post-operative recovery were discussed as well.  Possibility that this will not correct all symptoms was explained.  Post-operative pain, bleeding, constipation, and other problems after surgery were discussed.  We will work to minimize complications.   Educational handouts further explaining the pathology, treatment options, and bowel regimen were given as well.  Questions were answered.  The patient expresses understanding & wishes to proceed with surgery.   OR FINDINGS:   Patient had very inflamed spherical 12 x 10 x10 cm multicystic mass in the deep pelvis primarily along the left deep pelvis and retrorectal/presacral space.  Filled with thinly purulent and cheesy material consistent with chronically inflamed/infected cyst.  External hemorrhoidal tags left posterior lateral right posterior lateral.  Left lateral and right anterior enlarged grade 2/3 internal hemorrhoids.  Right posterior grade 2.  Internal hemorrhoidal ligation pexy done.  External hemorrhoidectomy knee done x2.  No evidence of any perirectal/perianal abscess.  No evidence of fistula.  No fissure.  No tumor.  Condyloma.  PROCEDURE:  Informed consent was confirmed.  Patient underwent general anesthesia without any difficulty & was positioned  in low lithotomy with arms tucked.  The patient had a Foley catheter sterilely placed.  The abdomen and perineum were prepped and draped in sterile fashion.  Surgical time-out confirmed our plan.  I placed a 8-mm robotic port in the left upper quadrant using Varess technique with the patient in steep reverse Trendelenburg and left sideup.  The patient had orogastric tube for decompression.  Entry was clean.  We induced carbon dioxide  insufflation.  Port placed.  All liver puncture.  Extra ports placed carefully under direct visualization.  Hemostasis done on the needle puncture site of the liver.  We positioned the patient head down.  Colon was rather dilated so I ended up placing a large Foley catheter balloon is a rectal tube to help better decompress it.  Small bowel and greater omentum easily fell out of the pelvis to help expose it.  There was no evidence of any peritonitis or proctitis.  No carcinomatosis mass or tumor.  Left ovarian simple cyst noted about 3 cm.  Otherwise adnexa within normal limits.  Uterus mildly enlarged but no discrete fibroid or other abnormality.   Went ahead and docked the [employer] carefully.  I turned attention to the pelvis.  I elevated the retrosigmoid colon.  I scored the peritoneum at the rectosigmoid region at the level of the sacral promontory and continued scoring of peritoneum using hook cautery down to the right peritoneal reflection.  I left the rectosigmoid mesentery anteriorly and got into the presacral plane.  It was rather edematous and somewhat thickened.  I followed blunt dissection down the sacrum and down towards the levators for good posterior rectal mobilzation.  Eventually became apparent that the presacral space had a significant spherical bulge.  This correlated with the multicystic mass on CT scan and MRI.  Was able to find a plane between the posterior mesorectum and the anterior cystic mass.  Rather inflamed.  Gradually was able to come around anteriorly.  I came around right posterior laterally as well.  Eventually was able to come to  sacral periosteum and follow this distally to the pelvic floor levators.  Came around right lateral and eventually freed off the mass.  It was quite turgid and large.  He ended up getting into it with evacuation of yellow watery fluid.  This helped better decompress the mass markedly.  Some white cheesy material noted consistent with sebaceous  material.  Mention was able to free this mass circumferentially off the mesial rectum anteriorly and lateral pelvic walls and then posteriorly to the pelvic floor.  The mass was posterior to the mesial rectum and sphincter complex.  Eventually freed it off.  It was most densely inflamed in the left deep posterior pelvis consistent with prior transgluteal/issue rectal pigtail drains into it.  When she was able to remove most of it intact.  We did copious irrigation and assured hemostasis.  I freed some more cystic wall off the levators of the pelvic floor and the left posterior oral lateral cavity that was actually even treating the levators into the issue rectal space.  Washed out and sure that.  There was some fullness in the left posterior pelvis more proximally.  I was able to remove some ellipsoid more firm masses.  Suspicious for lymph nodes.  Controlled a bleeder off the internal iliac vein with a 2-0 Prolene robotic intracorporeal suture figure-of-eight.  There was some fullness and swelling as well on the more superior presacral space.  Seemed somewhat cystic but near the proximal internal iliac.  Went ahead and opened into this and got brisk venous return consistent with venous radicles.  Was able to hold pressure and closed the venotomy with interrupted 2-0 Prolene suture.  We did more copious irrigation to assure hemostasis.  Inspection of the rectosigmoid and adnexa was clean and intact.  Because of the significant dissection, we did have anesthesia infuse firefly immunofluorescence.  The rectosigmoid colon lit up well.  This argued against any ischemia to the rectum or any other pelvic structures.  Therefore would not feel that we need to do a low anterior resection or colostomy.  We did copious irrigation.  Hemostasis was good. Did a final irrigation of antibiotic solution (clindamycin/gentamicin).  Held that in place.  The robot was undocked.  Drain placed laparoscopically and brought out  right lower quadrant robotic port site..  We placed the cystic mass and presumed lymph nodes inside and eco-sac bag.  Removed it out the periumbilical port site ever having to open it up to 3 cm to get the bag with all the cystic mass to material lymph nodes out.  We closed the fascial defect with #1 PDS interrupted suture.  Drain secured with 2-0 Prolene.  Skin closed with 4 Monocryl.  Sterile dressing applied.   Return attention to anorectal exam under anesthesia.  Saw some external hemorrhoidal tags right posterior left posterior.  Enlarged internal hemorrhoids.  At least grade 2.  Left lateral suspicious for grade 3.  I proceeded to do hemorrhoidal ligation and pexy.  I used a 2-0 Vicryl suture on a UR-6 needle in a figure-of-eight fashion 6 cm proximal to the anal verge.  I started at the largest hemorrhoid pile = left lateral.  Because of redundant hemorrhoidal tissue too bulky to merely ligate or pexy, I excised the excess internal/external hemorrhoid piles longitudinally in a fusiform biconcave fashion, at the  right posterior and left lateral locations, sparing the anal canal to avoid narrowing.  I then ran that stitch longitudinally more distally to close the hemorrhoidectomy wound to  the anal verge over a Parks self retaining retractor & occasionally a large Hill-Furgeson retractor to avoid narrowing of the anal canal.  I then tied that stitch down to cause a hemorrhoidopexy.   I then did hemorrhoidal ligation and pexy at the other 4 columns.  At the completion of this, all 6 anorectal columns were ligated and pexied in the classic hexagonal fashion (right anterior/lateral/posterior, left anterior/lateral/posterior).  I closed the external part of the hemorrhoidectomy wounds with interrupted horizontal mattress 2-0 chromic suture, leaving the last 5 mm open to allow natural drainage.    I redid anoscopy & examination.  At completion of this, all hemorrhoids had been removed or reduced into the  rectum.  There is no more prolapse.  Internal & external anatomy was more more normal.  Hemostasis was good.  Fluffed gauze was on-laid over the perianal region.  No packing done.  Patient is been extubated and is stable in the PACU recovery room.  I had discussed postop care in detail with the patient in the preop holding area.  Instructions for post-operative recovery and prescriptions are written. I discussed operative findings, updated the patient's status, discussed probable steps to recovery, and gave postoperative recommendations to the patient's family.  Recommendations were made.  Questions were answered.  She expressed understanding & appreciation.  Ardeth Sportsman, M.D., F.A.C.S. Gastrointestinal and Minimally Invasive Surgery [facility] [address], [address] [place], [place] [address] [phone] [address] / Paging

## 2018-02-27 ENCOUNTER — Encounter (HOSPITAL_COMMUNITY): Payer: Self-pay | Admitting: Surgery

## 2018-02-27 LAB — CBC
HEMATOCRIT: 26.9 % — AB (ref 36.0–46.0)
HEMOGLOBIN: 8.8 g/dL — AB (ref 12.0–15.0)
MCH: 33.3 pg (ref 26.0–34.0)
MCHC: 32.7 g/dL (ref 30.0–36.0)
MCV: 101.9 fL — ABNORMAL HIGH (ref 78.0–100.0)
Platelets: 306 10*3/uL (ref 150–400)
RBC: 2.64 MIL/uL — ABNORMAL LOW (ref 3.87–5.11)
RDW: 12.7 % (ref 11.5–15.5)
WBC: 14.5 10*3/uL — ABNORMAL HIGH (ref 4.0–10.5)

## 2018-02-27 LAB — BASIC METABOLIC PANEL
ANION GAP: 4 — AB (ref 5–15)
BUN: 11 mg/dL (ref 6–20)
CO2: 28 mmol/L (ref 22–32)
Calcium: 8.7 mg/dL — ABNORMAL LOW (ref 8.9–10.3)
Chloride: 109 mmol/L (ref 101–111)
Creatinine, Ser: 0.41 mg/dL — ABNORMAL LOW (ref 0.44–1.00)
GFR calc Af Amer: >60 mL/min (ref >60)
GFR calc non Af Amer: >60 mL/min (ref >60)
Glucose, Bld: 109 mg/dL — ABNORMAL HIGH (ref 65–99)
POTASSIUM: 4.7 mmol/L (ref 3.5–5.1)
Sodium: 141 mmol/L (ref 135–145)

## 2018-02-27 LAB — MAGNESIUM: Magnesium: 1.8 mg/dL (ref 1.7–2.4)

## 2018-02-27 MED ORDER — HYDROCORTISONE ACE-PRAMOXINE 2.5-1 % RE CREA
1.0000 "application " | TOPICAL_CREAM | Freq: Four times a day (QID) | RECTAL | Status: DC | PRN
  Filled 2018-02-27: qty 30

## 2018-02-27 MED ORDER — SODIUM CHLORIDE 0.9% FLUSH: 3.0000 mL | Freq: Two times a day (BID) | INTRAVENOUS | Status: DC

## 2018-02-27 MED ORDER — SODIUM CHLORIDE 0.9 % IV SOLN
250.0000 mL | INTRAVENOUS | Status: DC | PRN
Start: 2018-02-27 — End: 2018-02-28

## 2018-02-27 MED ORDER — LACTATED RINGERS IV BOLUS: 1000.0000 mL | Freq: Three times a day (TID) | INTRAVENOUS | Status: DC | PRN

## 2018-02-27 MED ORDER — PSYLLIUM 95 % PO PACK: 1.0000 | PACK | Freq: Two times a day (BID) | ORAL | Status: DC

## 2018-02-27 MED ORDER — POTASSIUM CHLORIDE CRYS ER 20 MEQ PO TBCR
40.0000 meq | EXTENDED_RELEASE_TABLET | Freq: Every day | ORAL | Status: DC
  Administered 2018-02-28: 40 meq via ORAL
  Filled 2018-02-27 (×2): qty 2

## 2018-02-27 MED ORDER — ACETAMINOPHEN 500 MG PO TABS: 1000.0000 mg | ORAL_TABLET | Freq: Three times a day (TID) | ORAL | 0 refills | Status: DC

## 2018-02-27 MED ORDER — PSYLLIUM 95 % PO PACK
1.0000 | PACK | Freq: Two times a day (BID) | ORAL | Status: DC
  Administered 2018-02-27 – 2018-02-28 (×3): 1 via ORAL
  Filled 2018-02-27 (×3): qty 1

## 2018-02-27 MED ORDER — SODIUM CHLORIDE 0.9% FLUSH: 3.0000 mL | INTRAVENOUS | Status: DC | PRN

## 2018-02-27 NOTE — Consult Note (Signed)
WOC follow-up: Pt did not receive an ostomy during surgery yesterday. Surgical team following for assessment and plan of care. Please re-consult if further assistance is needed.  Thank-you,  Cammie Mcgeeawn Aza Dantes MSN, RN, CWOCN, [place], CNS [phone]

## 2018-02-27 NOTE — Progress Notes (Addendum)
Stephanie [name] [id number] 07-Oct-1980  CARE TEAM:  PCP: Sherene Sires, DO  Outpatient Care Team: Patient Care Team: Sherene Sires, DO as PCP - General (Family Medicine) Ileana Roup, MD as Consulting Physician (Colon and Rectal Surgery) Michael Boston, MD as Consulting Physician (General Surgery)  Inpatient Treatment Team: Treatment Team: Attending Provider: Edison Pace, Md, MD; Rounding Team: Edison Pace, Md, MD; Consulting Physician: Ileana Roup, MD; Technician: Reuel Derby, NT; Technician: Etheleen Sia, NT; Technician: Leda Quail, NT; Registered Nurse: Vicente Serene, RN; Consulting Physician: Michael Boston, MD; Technician: Sueanne Margarita, NT; Technician: Denyse Dago, NT; Case Manager: Guadalupe Maple, RN; Registered Nurse: Rossie Muskrat, RN   Problem List:   Principal Problem:   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 02/26/2018 Active Problems:   Panic attacks   PTSD (post-traumatic stress disorder)   Recurrent presacral pelvic abscess    Constipation, chronic   Schizophrenia ([place])   Prolapsed internal hemorrhoids, grade 2&3, s/p hemorrhoidal ligation/pexy 02/26/2018   Depression   Hypertension   Pelvic pain   Panic disorder   Hypokalemia   Hypomagnesemia   External hemorrhoids status post hemorrhoidectomy x2, 02/26/2018   1 Day Post-Op  02/26/2018  POST-OPERATIVE DIAGNOSIS:   Retrorectal cyst with recurrent pelvic infections Grade 2/3 internal hemorrhoids with irritation Symptomatic external hemorrhoids with pain and irritation.  PROCEDURE:   XI ROBOTIC ASSISTED LYSIS OF ADHESIONS  RESECTION OF RETRORECTAL CYSTIC MASS IN DEEP PELVIS ASSESSMENT OF PERFUSION WITH IMMUNOFLUORESCENT FIREFLY INJECTION EXAM UNDER ANESTHESIA WITH HEMORRHOIDAL LIGATION AND PEXY EXTERNAL HEMORRHOIDECTOMY X2  SURGEON:  Adin Hector, MD  LOS:  7 days   Assessment  Recovering far status post robotic resection of inflamed  infected deep pelvic cyst on sacrum.  Plan:  -Doing well so far.  Stop IV fluids.  Advance diet.  Oral pain control.  Drain care.  Soaks and other care for hemorrhoids.  Hypokalemia corrected.  Decrease potassium just daily and follow.  Magnesium okay.  Anxiety/panic attacks/PTSD under better control with psychological support as well as Celexa/hydroxyzine.  Most likely should continue indefinitely with outpatient psychiatric follow-up  Apparently she is losing the house that she is in.  Is getting condemned.  She is working on plans to live with her mother/family  -VTE prophylaxis- SCDs, etc -mobilize as tolerated to help recovery  D/C patient from hospital when patient meets criteria (anticipate in 1-2 day(s)):  Tolerating oral intake well Ambulating well Adequate pain control without IV medications Urinating  Having flatus Disposition planning in place   30 minutes spent in review, evaluation, examination, counseling, and coordination of care.  More than 50% of that time was spent in counseling.  Adin Hector, MD, FACS, MASCRS Gastrointestinal and Minimally Invasive Surgery    [address], [place] [address] [place], [place] [address] [phone] Main / Paging [phone] Fax   02/27/2018    Subjective: (Chief complaint)  Denies much pain  Feels pretty good overall.  Very appreciative of nursing and surgical care.  Objective:  Vital signs:  Vitals:   02/26/18 2155 02/26/18 2242 02/27/18 0155 02/27/18 0605  BP: (!) 121/93 (!) 127/91 (!) 138/93 112/74  Pulse: 74 82 78 86  Resp: '18 18 18 18  ' Temp: 97.7 F (36.5 C) 98.7 F (37.1 C) 98.1 F (36.7 C) 98.5 F (36.9 C)  TempSrc: Oral Oral Oral Oral  SpO2: 100% 100% 100% 100%  Weight:      Height:  Last BM Date: 02/26/18  Intake/Output   Yesterday:  06/05 0701 - 06/06 0700 In: 2725 [P.O.:480; I.V.:2145; IV Piggyback:100] Out: 9373 [Urine:930; Drains:190; Blood:300] This  shift:  Total I/O In: 1105 [P.O.:480; I.V.:575; IV Piggyback:50] Out: 82 [Urine:680; Drains:140]  Bowel function:  Flatus: YES  BM:  No  Drain: Serosanguinous   Physical Exam:  General: Pt awake/alert/oriented x4 in no acute distress Eyes: PERRL, normal EOM.  Sclera clear.  No icterus Neuro: CN II-XII intact w/o focal sensory/motor deficits. Lymph: No head/neck/groin lymphadenopathy Psych:  No delerium/psychosis/paranoia HENT: Normocephalic, Mucus membranes moist.  No thrush Neck: Supple, No tracheal deviation Chest: No chest wall pain w good excursion CV:  Pulses intact.  Regular rhythm MS: Normal AROM mjr joints.  No obvious deformity  Abdomen: Soft.  Nondistended.  Mildly tender at incisions only.  No evidence of peritonitis.  No incarcerated hernias.  Ext:  No deformity.  No mjr edema.  No cyanosis Skin: No petechiae / purpura  Results:   Labs: Results for orders placed or performed during the hospital encounter of 02/19/18 (from the past 48 hour(s))  MRSA PCR Screening     Status: None   Collection Time: 02/25/18 10:23 PM  Result Value Ref Range   MRSA by PCR NEGATIVE NEGATIVE    Comment:        The GeneXpert MRSA Assay (FDA approved for NASAL specimens only), is one component of a comprehensive MRSA colonization surveillance program. It is not intended to diagnose MRSA infection nor to guide or monitor treatment for MRSA infections. Performed at [facility], [place] [address] [place], [place] [address]   Creatinine, serum     Status: None   Collection Time: 02/26/18  4:38 AM  Result Value Ref Range   Creatinine, Ser 0.48 0.44 - 1.00 mg/dL   GFR calc non Af Amer >60 >60 mL/min   GFR calc Af Amer >60 >60 mL/min    Comment: (NOTE) The eGFR has been calculated using the CKD EPI equation. This calculation has not been validated in all clinical situations. eGFR's persistently <60 mL/min signify possible Chronic Kidney Disease. Performed  at [facility], [place] [address] [place], [place] [address]   Magnesium     Status: None   Collection Time: 02/26/18  4:38 AM  Result Value Ref Range   Magnesium 2.1 1.7 - 2.4 mg/dL    Comment: Performed at [facility], [place] [address], [place], [place] [address]  Potassium     Status: None   Collection Time: 02/26/18  4:38 AM  Result Value Ref Range   Potassium 4.1 3.5 - 5.1 mmol/L    Comment: NO VISIBLE HEMOLYSIS DELTA CHECK NOTED REPEATED TO VERIFY Performed at [place] [address] [place], [place] [address]   Type and screen [place]     Status: None   Collection Time: 02/26/18 12:20 PM  Result Value Ref Range   ABO/RH(D) O POS    Antibody Screen NEG    Sample Expiration      03/01/2018 Performed at [facility], [place] [address], [place], [place] [address]   ABO/Rh     Status: None   Collection Time: 02/26/18 12:20 PM  Result Value Ref Range   ABO/RH(D)      O POS Performed at [facility], [place] [address] [place], [place] [address]   Basic metabolic panel     Status: Abnormal  Collection Time: 02/27/18  4:21 AM  Result Value Ref Range   Sodium 141 135 - 145 mmol/L   Potassium 4.7 3.5 - 5.1 mmol/L   Chloride 109 101 - 111 mmol/L   CO2 28 22 - 32 mmol/L   Glucose, Bld 109 (H) 65 - 99 mg/dL   BUN 11 6 - 20 mg/dL   Creatinine, Ser 0.41 (L) 0.44 - 1.00 mg/dL   Calcium 8.7 (L) 8.9 - 10.3 mg/dL   GFR calc non Af Amer >60 >60 mL/min   GFR calc Af Amer >60 >60 mL/min    Comment: (NOTE) The eGFR has been calculated using the CKD EPI equation. This calculation has not been validated in all clinical situations. eGFR's persistently <60 mL/min signify possible Chronic Kidney Disease.    Anion gap 4 (L) 5 - 15    Comment: Performed at [facility], [place] [address], [place], [place] [address]  CBC     Status: Abnormal   Collection  Time: 02/27/18  4:21 AM  Result Value Ref Range   WBC 14.5 (H) 4.0 - 10.5 K/uL   RBC 2.64 (L) 3.87 - 5.11 MIL/uL   Hemoglobin 8.8 (L) 12.0 - 15.0 g/dL   HCT 26.9 (L) 36.0 - 46.0 %   MCV 101.9 (H) 78.0 - 100.0 fL   MCH 33.3 26.0 - 34.0 pg   MCHC 32.7 30.0 - 36.0 g/dL   RDW 12.7 11.5 - 15.5 %   Platelets 306 150 - 400 K/uL    Comment: Performed at [facility], [place] [address] [place], [place] [address]  Magnesium     Status: None   Collection Time: 02/27/18  4:21 AM  Result Value Ref Range   Magnesium 1.8 1.7 - 2.4 mg/dL    Comment: Performed at [facility], [place] [address], [place], [place] [address]    Imaging / Studies: No results found.  Medications / Allergies: per chart  Antibiotics: Anti-infectives (From admission, onward)   Start     Dose/Rate Route Frequency Ordered Stop   02/26/18 2100  piperacillin-tazobactam (ZOSYN) IVPB 3.375 g     3.375 g 12.5 mL/hr over 240 Minutes Intravenous Every 8 hours 02/26/18 1957 03/01/18 2159   02/26/18 1728  clindamycin (CLEOCIN) 900 mg, gentamicin (GARAMYCIN) 240 mg in sodium chloride 0.9 % 1,000 mL for intraperitoneal lavage  Status:  Discontinued       As needed 02/26/18 1729 02/26/18 1839   02/26/18 1235  sodium chloride 0.9 % with cefoTEtan (CEFOTAN) ADS Med    Note to Pharmacy:  Mardelle Matte   : cabinet override      02/26/18 1235 02/27/18 0044   02/26/18 0600  cefoTEtan in Dextrose 5% (CEFOTAN) IVPB 2 g     2 g 100 mL/hr over 30 Minutes Intravenous On call to O.R. 02/23/18 0950 02/26/18 1411   02/26/18 0600  clindamycin (CLEOCIN) 900 mg, gentamicin (GARAMYCIN) 240 mg in sodium chloride 0.9 % 1,000 mL for intraperitoneal lavage      Intraperitoneal To Surgery 02/23/18 0950 02/27/18 0600   02/25/18 1400  neomycin (MYCIFRADIN) tablet 1,000 mg  Status:  Discontinued     1,000 mg Oral 3 times per day 02/23/18 0950 02/23/18 1755   02/25/18 1400  metroNIDAZOLE (FLAGYL) tablet 1,000 mg      1,000 mg Oral 3 times per day 02/23/18 1755 02/25/18 2117   02/25/18 1400  neomycin (MYCIFRADIN) tablet 1,000 mg     1,000 mg Oral  3 times per day 02/23/18 1755 02/25/18 2118   02/23/18 1400  metroNIDAZOLE (FLAGYL) tablet 1,000 mg  Status:  Discontinued     1,000 mg Oral 3 times per day 02/23/18 0950 02/23/18 1755   02/19/18 2200  piperacillin-tazobactam (ZOSYN) IVPB 3.375 g  Status:  Discontinued     3.375 g 12.5 mL/hr over 240 Minutes Intravenous Every 8 hours 02/19/18 1741 02/26/18 1957   02/19/18 1600  piperacillin-tazobactam (ZOSYN) IVPB 3.375 g     3.375 g 100 mL/hr over 30 Minutes Intravenous  Once 02/19/18 1548 02/19/18 1707        Note: Portions of this report may have been transcribed using voice recognition software. Every effort was made to ensure accuracy; however, inadvertent computerized transcription errors may be present.   Any transcriptional errors that result from this process are unintentional.     Adin Hector, MD, FACS, MASCRS Gastrointestinal and Minimally Invasive Surgery    [address] [place], [place] [address] [phone] Main / Paging [phone] Fax    02/27/2018

## 2018-02-27 NOTE — Discharge Summary (Signed)
[facility] Discharge Summary   Patient ID: Stephanie [name] MRN: [id number] DOB/AGE: August 26, 1981 37 y.o.  Admit date: 02/19/2018 Discharge date: 02/28/2018  Discharge Diagnosis Patient Active Problem List   Diagnosis Date Noted  . 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 External hemorrhoids status post hemorrhoidectomy x2, 02/26/2018 02/26/2018  . Hypokalemia 02/25/2018  . Hypomagnesemia 02/25/2018  . Panic disorder 02/22/2018  . Pelvic pain 02/21/2018  . Schizophrenia ([facility])   . Panic attacks   . Hypertension   . PTSD (post-traumatic stress disorder)   . Constipation, chronic 02/20/2018  . Sweaty palms 10/25/2017  . Encounter to establish care 10/25/2017  . Depression 10/25/2017  . 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 Prolapsed internal hemorrhoids, grade 2&3, s/p hemorrhoidal ligation/pexy 02/26/2018 05/01/2013   Consultants [facility]   Procedures Dr. Karie SodaSteven Gross (02/26/18) -  XI ROBOTIC ASSISTED LYSIS OF ADHESIONS RESECTION OF RETRORECTAL CYSTIC MASS IN DEEP PELVIS ASSESSMENT OF PERFUSION WITH IMMUNOFLUORESCENT FIREFLY INJECTION EXAM UNDER ANESTHESIA WITH HEMORRHOIDAL LIGATION AND PEXY EXTERNAL HEMORRHOIDECTOMY X2  Hospital Course:  Ms. Azucena CecilBurton is a 37 year old African-American female with a past medical history of hypertension, anxiety, depression, PTSD, and recurrent perirectal abscess who presented to [facility] emergency department with perirectal pain.  She has a history of incision and drainage of perirectal horseshoe abscess in 2016.  After this time she had recurrent fluid collections in the retrorectal space that required percutaneous drainage x3.  [facility] in the [facility] and outpatient colonoscopies have been attempted for further work-up, however due to poor outpatient colon prep the patient has never received an adequate colonoscopy.  Work-up in the emergency  department 02/19/2018 included a CT scan of the abdomen and pelvis significant for a large complex cystic fluid collection in the left perirectal region with mass-effect upon the adjacent rectum.  Leukocytosis of 14,000 was present.  The patient was admitted and started on IV antibiotics.  She received an inpatient bowel prep and underwent colonoscopy that was without abnormality. She underwent the above procedure by Dr. Jeannett SeniorStephen gross on 02/26/2018.  Postoperatively her diet was advanced as tolerated.  On 02/28/2018 the patient's vitals were stable, pain controlled on oral medications, mobilizing, tolerating p.o. intake, having bowel function, and medically stable for discharge home.  She will follow-up in our office as below and knows to call with questions or concerns.  Physical Exam: General:  Alert, NAD, comfortable Pulm: CTAB, effort normal Cardio: RRR Abd:  Soft, nontender, ND, incisions C/D/I, +BS, drain with serosanguinous output  Allergies as of 02/28/2018   No Known Allergies     Medication List    TAKE these medications   acetaminophen 500 MG tablet Commonly known as:  TYLENOL Take 2 tablets (1,000 mg total) by mouth every 8 (eight) hours.   amLODipine 5 MG tablet Commonly known as:  NORVASC TK 1 T PO EACH DAY   BC HEADACHE POWDER PO Take 1 packet daily as needed by mouth (headaches).   celecoxib 200 MG capsule Commonly known as:  CELEBREX Take 200 mg by mouth daily.   citalopram 10 MG tablet Commonly known as:  CELEXA Take 1 tablet (10 mg total) by mouth daily.   cyclobenzaprine 10 MG tablet Commonly known as:  FLEXERIL TK ONE T PO D PRN FOR SPASMS   gabapentin 300 MG capsule Commonly known as:  NEURONTIN Take 1 capsule (300 mg total) by mouth 2 (two) times daily.  hydrOXYzine 10 MG tablet Commonly known as:  ATARAX/VISTARIL Take 1 tablet (10 mg total) by mouth 2 (two) times daily.   meloxicam 15 MG tablet Commonly known as:  MOBIC Take 15 mg by mouth daily as  needed (for pain/inflammation.).   multivitamins with iron Tabs tablet Take 1 tablet by mouth daily.   oxyCODONE 5 MG immediate release tablet Commonly known as:  Oxy IR/ROXICODONE Take 1-2 tablets (5-10 mg total) by mouth every 6 (six) hours as needed for moderate pain.   psyllium 95 % Pack Commonly known as:  HYDROCIL/METAMUCIL Take 1 packet by mouth 2 (two) times daily.   senna-docusate 8.6-50 MG tablet Commonly known as:  Senokot-S Take 1 tablet by mouth 2 (two) times daily.   SUMAtriptan 25 MG tablet Commonly known as:  IMITREX Take 25 mg every 2 (two) hours as needed by mouth for migraine.            Discharge Care Instructions  (From admission, onward)        Start     Ordered   02/26/18 0000  Discharge wound care:    Comments:  It is good for closed incisions and even open wounds to be washed every day.  Shower every day.  Short baths are fine.  Wash the incisions and wounds clean with soap & water.     You may leave closed incisions open to air if it is dry.   You may cover the incision with clean gauze & replace it after your daily shower for comfort.   You have a drain, so wash around the skin exit site with soap & water and place a new dressing of gauze or band aid around the skin every day.  Keep the drain site clean & dry.   02/26/18 1833       Follow-up Information    Gross, Viviann Spare, MD. Schedule an appointment as soon as possible for a visit in 3 weeks.   Specialty:  General Surgery Why:  To follow up after your operation, To follow up after your hospital stay Contact information: [address] [place] [address] [phone]        [facility], [place]. Schedule an appointment as soon as possible for a visit in 1 week.   Specialty:  General Surgery Why:  To have your drain removed & incisions re-checked Contact information: [address] [place] [address] [phone]        Marthenia Rolling, DO. Call.   Specialty:  Family Medicine Why:  call to arrange follow up regarding medications for and management of Anxiety/panic attacks/PTSD  Contact information: [address] [place] [address] [phone]           Signed: Franne Forts, [facility] 02/28/2018, 8:06 AM Pager: [phone] Consults: [phone] Mon 7:00 am -11:30 AM Tues-Fri 7:00 am-4:30 pm Sat-Sun 7:00 am-11:30 am

## 2018-02-28 ENCOUNTER — Other Ambulatory Visit (INDEPENDENT_AMBULATORY_CARE_PROVIDER_SITE_OTHER): Payer: Self-pay | Admitting: Physician Assistant

## 2018-02-28 LAB — CBC
HCT: 24.2 % — ABNORMAL LOW (ref 36.0–46.0)
Hemoglobin: 7.8 g/dL — ABNORMAL LOW (ref 12.0–15.0)
MCH: 33.3 pg (ref 26.0–34.0)
MCHC: 32.2 g/dL (ref 30.0–36.0)
MCV: 103.4 fL — ABNORMAL HIGH (ref 78.0–100.0)
Platelets: 317 10*3/uL (ref 150–400)
RBC: 2.34 MIL/uL — AB (ref 3.87–5.11)
RDW: 13.1 % (ref 11.5–15.5)
WBC: 10.5 10*3/uL (ref 4.0–10.5)

## 2018-02-28 LAB — POTASSIUM: POTASSIUM: 4.1 mmol/L (ref 3.5–5.1)

## 2018-02-28 MED ORDER — MORPHINE SULFATE (PF) 2 MG/ML IV SOLN: 1.0000 mg | INTRAVENOUS | Status: DC | PRN

## 2018-02-28 MED ORDER — OXYCODONE HCL 5 MG PO TABS: 5.0000 mg | ORAL_TABLET | Freq: Four times a day (QID) | ORAL | 0 refills | Status: DC | PRN

## 2018-02-28 MED ORDER — TAB-A-VITE/IRON PO TABS: 1.0000 | ORAL_TABLET | Freq: Every day | ORAL | 0 refills | Status: DC

## 2018-02-28 MED ORDER — HYDROCORTISONE ACE-PRAMOXINE 2.5-1 % RE CREA: 1.0000 "application " | TOPICAL_CREAM | Freq: Four times a day (QID) | RECTAL | Status: DC | PRN

## 2018-02-28 MED ORDER — TAB-A-VITE/IRON PO TABS
1.0000 | ORAL_TABLET | Freq: Every day | ORAL | Status: DC
  Administered 2018-02-28: 1 via ORAL
  Filled 2018-02-28: qty 1

## 2018-02-28 NOTE — Progress Notes (Signed)
Discharge and medication instructions reviewed with patient. Questions answered and patient denies further questions. No prescriptions given. Patient has someone to transport her home en route. Lina SarBeth Lehman Whiteley, RN

## 2018-03-10 NOTE — Anesthesia Postprocedure Evaluation (Signed)
Anesthesia Post Note  Patient: Nicholaus Corollaasha T Bouwman  Procedure(s) Performed: XI ROBOTIC ASSISTED LYSIS OF ADHESIONS, RETRORECTAL RESECTION OF PRESACRAL CYSTIC MASS, FIREFLY INJECTION (N/A Abdomen) EXAM UNDER ANESTHESIA WITH HEMORRHOIDAL LIGATION AND PEXY, EXTERNAL HEMORRHOIDECTOMY X2 (N/A Rectum)     Patient location during evaluation: PACU Anesthesia Type: General Level of consciousness: awake and alert Pain management: pain level controlled Vital Signs Assessment: post-procedure vital signs reviewed and stable Respiratory status: spontaneous breathing, nonlabored ventilation, respiratory function stable and patient connected to nasal cannula oxygen Cardiovascular status: blood pressure returned to baseline and stable Postop Assessment: no apparent nausea or vomiting Anesthetic complications: no    Last Vitals:  Vitals:   02/28/18 0524 02/28/18 1011  BP: 115/75 123/80  Pulse: 80 84  Resp: 18 17  Temp: 36.6 C 36.8 C  SpO2: 100% 99%    Last Pain:  Vitals:   02/28/18 1011  TempSrc: Oral  PainSc:                  Sherisse Fullilove S

## 2018-04-06 ENCOUNTER — Other Ambulatory Visit: Payer: Self-pay

## 2018-04-06 ENCOUNTER — Encounter (HOSPITAL_COMMUNITY): Payer: Self-pay

## 2018-04-06 ENCOUNTER — Emergency Department (HOSPITAL_COMMUNITY): Payer: Medicaid Other

## 2018-04-06 ENCOUNTER — Inpatient Hospital Stay (HOSPITAL_COMMUNITY)
Admission: EM | Admit: 2018-04-06 | Discharge: 2018-04-08 | DRG: 863 | Disposition: A | Discharge status: Home / Self Care | Payer: Medicaid Other | Attending: Surgery | Admitting: Surgery

## 2018-04-06 DIAGNOSIS — D649 Anemia, unspecified: Secondary | ICD-10-CM | POA: Diagnosis present

## 2018-04-06 DIAGNOSIS — E878 Other disorders of electrolyte and fluid balance, not elsewhere classified: Secondary | ICD-10-CM | POA: Diagnosis present

## 2018-04-06 DIAGNOSIS — Z8249 Family history of ischemic heart disease and other diseases of the circulatory system: Secondary | ICD-10-CM

## 2018-04-06 DIAGNOSIS — Y838 Other surgical procedures as the cause of abnormal reaction of the patient, or of later complication, without mention of misadventure at the time of the procedure: Secondary | ICD-10-CM | POA: Diagnosis present

## 2018-04-06 DIAGNOSIS — F329 Major depressive disorder, single episode, unspecified: Secondary | ICD-10-CM | POA: Diagnosis present

## 2018-04-06 DIAGNOSIS — K611 Rectal abscess: Secondary | ICD-10-CM | POA: Diagnosis present

## 2018-04-06 DIAGNOSIS — F32A Depression, unspecified: Secondary | ICD-10-CM | POA: Diagnosis present

## 2018-04-06 DIAGNOSIS — Z791 Long term (current) use of non-steroidal anti-inflammatories (NSAID): Secondary | ICD-10-CM

## 2018-04-06 DIAGNOSIS — R197 Diarrhea, unspecified: Secondary | ICD-10-CM | POA: Diagnosis present

## 2018-04-06 DIAGNOSIS — Z79899 Other long term (current) drug therapy: Secondary | ICD-10-CM

## 2018-04-06 DIAGNOSIS — Z9119 Patient's noncompliance with other medical treatment and regimen: Secondary | ICD-10-CM

## 2018-04-06 DIAGNOSIS — Z8632 Personal history of gestational diabetes: Secondary | ICD-10-CM

## 2018-04-06 DIAGNOSIS — Z7982 Long term (current) use of aspirin: Secondary | ICD-10-CM

## 2018-04-06 DIAGNOSIS — Z9889 Other specified postprocedural states: Secondary | ICD-10-CM | POA: Diagnosis not present

## 2018-04-06 DIAGNOSIS — T8149XA Infection following a procedure, other surgical site, initial encounter: Secondary | ICD-10-CM | POA: Diagnosis present

## 2018-04-06 DIAGNOSIS — K644 Residual hemorrhoidal skin tags: Secondary | ICD-10-CM | POA: Diagnosis present

## 2018-04-06 DIAGNOSIS — K5909 Other constipation: Secondary | ICD-10-CM | POA: Diagnosis present

## 2018-04-06 DIAGNOSIS — Z98891 History of uterine scar from previous surgery: Secondary | ICD-10-CM

## 2018-04-06 DIAGNOSIS — I10 Essential (primary) hypertension: Secondary | ICD-10-CM | POA: Diagnosis present

## 2018-04-06 DIAGNOSIS — F209 Schizophrenia, unspecified: Secondary | ICD-10-CM | POA: Diagnosis present

## 2018-04-06 DIAGNOSIS — Z9851 Tubal ligation status: Secondary | ICD-10-CM

## 2018-04-06 DIAGNOSIS — K642 Third degree hemorrhoids: Secondary | ICD-10-CM | POA: Diagnosis present

## 2018-04-06 DIAGNOSIS — T8143XA Infection following a procedure, organ and space surgical site, initial encounter: Secondary | ICD-10-CM | POA: Diagnosis present

## 2018-04-06 DIAGNOSIS — F410 Panic disorder [episodic paroxysmal anxiety] without agoraphobia: Secondary | ICD-10-CM | POA: Diagnosis present

## 2018-04-06 DIAGNOSIS — F17210 Nicotine dependence, cigarettes, uncomplicated: Secondary | ICD-10-CM | POA: Diagnosis present

## 2018-04-06 DIAGNOSIS — F4310 Post-traumatic stress disorder, unspecified: Secondary | ICD-10-CM | POA: Diagnosis present

## 2018-04-06 DIAGNOSIS — M549 Dorsalgia, unspecified: Secondary | ICD-10-CM | POA: Diagnosis present

## 2018-04-06 DIAGNOSIS — N739 Female pelvic inflammatory disease, unspecified: Secondary | ICD-10-CM | POA: Diagnosis present

## 2018-04-06 DIAGNOSIS — Z79891 Long term (current) use of opiate analgesic: Secondary | ICD-10-CM | POA: Diagnosis not present

## 2018-04-06 DIAGNOSIS — Z91199 Patient's noncompliance with other medical treatment and regimen due to unspecified reason: Secondary | ICD-10-CM

## 2018-04-06 DIAGNOSIS — K615 Supralevator abscess: Secondary | ICD-10-CM

## 2018-04-06 DIAGNOSIS — R1900 Intra-abdominal and pelvic swelling, mass and lump, unspecified site: Secondary | ICD-10-CM | POA: Diagnosis present

## 2018-04-06 LAB — URINALYSIS, ROUTINE W REFLEX MICROSCOPIC
BILIRUBIN URINE: NEGATIVE
GLUCOSE, UA: NEGATIVE mg/dL
KETONES UR: 80 mg/dL — AB
NITRITE: NEGATIVE
PH: 5 (ref 5.0–8.0)
Protein, ur: 100 mg/dL — AB

## 2018-04-06 LAB — COMPREHENSIVE METABOLIC PANEL
ALK PHOS: 93 U/L (ref 38–126)
ALT: 13 U/L (ref 0–44)
ANION GAP: 13 (ref 5–15)
AST: 20 U/L (ref 15–41)
Albumin: 3.6 g/dL (ref 3.5–5.0)
BUN: 10 mg/dL (ref 6–20)
CALCIUM: 8.9 mg/dL (ref 8.9–10.3)
CHLORIDE: 96 mmol/L — AB (ref 98–111)
CO2: 28 mmol/L (ref 22–32)
Creatinine, Ser: 0.44 mg/dL (ref 0.44–1.00)
GFR calc non Af Amer: >60 mL/min (ref >60)
Glucose, Bld: 86 mg/dL (ref 70–99)
Potassium: 3.9 mmol/L (ref 3.5–5.1)
Sodium: 137 mmol/L (ref 135–145)
Total Bilirubin: 1 mg/dL (ref 0.3–1.2)
Total Protein: 8.1 g/dL (ref 6.5–8.1)

## 2018-04-06 LAB — CBC
HEMATOCRIT: 34.5 % — AB (ref 36.0–46.0)
HEMOGLOBIN: 11.1 g/dL — AB (ref 12.0–15.0)
MCH: 30.7 pg (ref 26.0–34.0)
MCHC: 32.2 g/dL (ref 30.0–36.0)
MCV: 95.6 fL (ref 78.0–100.0)
PLATELETS: 261 10*3/uL (ref 150–400)
RBC: 3.61 MIL/uL — AB (ref 3.87–5.11)
RDW: 14.6 % (ref 11.5–15.5)
WBC: 21.4 10*3/uL — ABNORMAL HIGH (ref 4.0–10.5)

## 2018-04-06 LAB — I-STAT BETA HCG BLOOD, ED (MC, WL, AP ONLY): I-stat hCG, quantitative: <5 m[IU]/mL (ref <5)

## 2018-04-06 MED ORDER — CITALOPRAM HYDROBROMIDE 10 MG PO TABS
10.0000 mg | ORAL_TABLET | Freq: Every day | ORAL | Status: DC
  Administered 2018-04-06 – 2018-04-08 (×3): 10 mg via ORAL
  Filled 2018-04-06 (×3): qty 1

## 2018-04-06 MED ORDER — SUMATRIPTAN SUCCINATE 25 MG PO TABS
25.0000 mg | ORAL_TABLET | ORAL | Status: DC | PRN
  Filled 2018-04-06: qty 1

## 2018-04-06 MED ORDER — ONDANSETRON HCL 4 MG/2ML IJ SOLN
4.0000 mg | Freq: Once | INTRAMUSCULAR | Status: AC
  Administered 2018-04-06: 4 mg via INTRAVENOUS
  Filled 2018-04-06: qty 2

## 2018-04-06 MED ORDER — HYDRALAZINE HCL 20 MG/ML IJ SOLN: 10.0000 mg | INTRAMUSCULAR | Status: DC | PRN

## 2018-04-06 MED ORDER — PIPERACILLIN-TAZOBACTAM 3.375 G IVPB 30 MIN
3.3750 g | INTRAVENOUS | Status: AC
Start: 2018-04-06 — End: 2018-04-07
  Administered 2018-04-06: 3.375 g via INTRAVENOUS
  Filled 2018-04-06 (×2): qty 50

## 2018-04-06 MED ORDER — IOPAMIDOL (ISOVUE-300) INJECTION 61%
INTRAVENOUS | Status: AC
  Filled 2018-04-06: qty 100

## 2018-04-06 MED ORDER — ACETAMINOPHEN 500 MG PO TABS
1000.0000 mg | ORAL_TABLET | Freq: Four times a day (QID) | ORAL | Status: DC
  Administered 2018-04-06 – 2018-04-08 (×5): 1000 mg via ORAL
  Filled 2018-04-06 (×5): qty 2

## 2018-04-06 MED ORDER — ONDANSETRON 4 MG PO TBDP: 4.0000 mg | ORAL_TABLET | Freq: Four times a day (QID) | ORAL | Status: DC | PRN

## 2018-04-06 MED ORDER — OXYCODONE HCL 5 MG PO TABS
5.0000 mg | ORAL_TABLET | ORAL | Status: DC | PRN
  Administered 2018-04-06 – 2018-04-07 (×3): 10 mg via ORAL
  Administered 2018-04-08: 5 mg via ORAL
  Administered 2018-04-08: 10 mg via ORAL
  Filled 2018-04-06 (×2): qty 2
  Filled 2018-04-06: qty 1
  Filled 2018-04-06 (×2): qty 2

## 2018-04-06 MED ORDER — AMLODIPINE BESYLATE 5 MG PO TABS
5.0000 mg | ORAL_TABLET | Freq: Every day | ORAL | Status: DC
  Administered 2018-04-06 – 2018-04-08 (×2): 5 mg via ORAL
  Filled 2018-04-06 (×3): qty 1

## 2018-04-06 MED ORDER — PIPERACILLIN-TAZOBACTAM 3.375 G IVPB
3.3750 g | Freq: Three times a day (TID) | INTRAVENOUS | Status: DC
  Administered 2018-04-07 – 2018-04-08 (×4): 3.375 g via INTRAVENOUS
  Filled 2018-04-06 (×4): qty 50

## 2018-04-06 MED ORDER — SODIUM CHLORIDE 0.9 % IV BOLUS
1000.0000 mL | Freq: Once | INTRAVENOUS | Status: AC
  Administered 2018-04-06: 1000 mL via INTRAVENOUS

## 2018-04-06 MED ORDER — IOPAMIDOL (ISOVUE-300) INJECTION 61%
100.0000 mL | Freq: Once | INTRAVENOUS | Status: AC | PRN
  Administered 2018-04-06: 100 mL via INTRAVENOUS

## 2018-04-06 MED ORDER — HYDROMORPHONE HCL 1 MG/ML IJ SOLN
1.0000 mg | Freq: Once | INTRAMUSCULAR | Status: AC
Start: 2018-04-06 — End: 2018-04-06
  Administered 2018-04-06: 1 mg via INTRAVENOUS
  Filled 2018-04-06: qty 1

## 2018-04-06 MED ORDER — HYDROMORPHONE HCL 1 MG/ML IJ SOLN
0.5000 mg | INTRAMUSCULAR | Status: DC | PRN
  Administered 2018-04-07 – 2018-04-08 (×6): 0.5 mg via INTRAVENOUS
  Filled 2018-04-06 (×6): qty 0.5

## 2018-04-06 MED ORDER — SODIUM CHLORIDE 0.9 % IV SOLN: INTRAVENOUS | Status: DC

## 2018-04-06 MED ORDER — ONDANSETRON HCL 4 MG/2ML IJ SOLN: 4.0000 mg | Freq: Four times a day (QID) | INTRAMUSCULAR | Status: DC | PRN

## 2018-04-06 MED ORDER — HYDROXYZINE HCL 10 MG PO TABS
10.0000 mg | ORAL_TABLET | Freq: Two times a day (BID) | ORAL | Status: DC | PRN
  Filled 2018-04-06 (×2): qty 1

## 2018-04-06 MED ORDER — PSYLLIUM 95 % PO PACK: 1.0000 | PACK | Freq: Two times a day (BID) | ORAL | Status: DC

## 2018-04-06 MED ORDER — ENOXAPARIN SODIUM 40 MG/0.4ML ~~LOC~~ SOLN: 40.0000 mg | SUBCUTANEOUS | Status: DC

## 2018-04-06 MED ORDER — GABAPENTIN 300 MG PO CAPS
300.0000 mg | ORAL_CAPSULE | Freq: Two times a day (BID) | ORAL | Status: DC
  Administered 2018-04-06 – 2018-04-08 (×4): 300 mg via ORAL
  Filled 2018-04-06 (×4): qty 1

## 2018-04-06 MED ORDER — BELLADONNA ALKALOIDS-OPIUM 16.2-60 MG RE SUPP
1.0000 | Freq: Once | RECTAL | Status: AC
  Administered 2018-04-06: 1 via RECTAL
  Filled 2018-04-06: qty 1

## 2018-04-06 NOTE — ED Triage Notes (Signed)
EMS reports from home, surgery to Colon June 6th, c/o constipation x 3 days, abdominal pain  BP 140/90 HR 115 Resp 22 Sp02 98 RA

## 2018-04-06 NOTE — Progress Notes (Signed)
Pharmacy Antibiotic Note  Stephanie [name] is a 37 y.o. female admitted on 04/06/2018 with intra-abdominal infection.  Pharmacy has been consulted for zosyn dosing.  Plan: Zosyn 3.375g IV q8h (4 hour infusion).  F/u scr/cultures  Height: 5\' 7"  (170.2 cm) Weight: 170 lb (77.1 kg) IBW/kg (Calculated) : 61.6  Temp (24hrs), Avg:98.2 F (36.8 C), Min:98.2 F (36.8 C), Max:98.2 F (36.8 C)  Recent Labs  Lab 04/06/18 1701  WBC 21.4*  CREATININE 0.44    Estimated Creatinine Clearance: 106.1 mL/min (by C-G formula based on SCr of 0.44 mg/dL).    No Known Allergies  Antimicrobials this admission: 7/14 zosyn >>    >>   Dose adjustments this admission:   Microbiology results:  BCx:   UCx:    Sputum:    MRSA PCR:   Thank you for allowing pharmacy to be a part of this patient's care.  Lorenza EvangelistGreen, Delmar Arriaga R 04/06/2018 11:24 PM

## 2018-04-06 NOTE — H&P (Signed)
Stephanie [name] is an 37 y.o. female.   Chief Complaint: pain HPI: 44 yof s/p resection retrorectal cystic mass and hemorrhoidectomy by Dr Johney Maine on 6/5 for recurrent pelvic infections.  Had previously undergone mr with Dr Dema Severin and endoscopy.  The path from this was benign epithelial cyst with inflammation.  She was discharged home and has had loose bms since then. She has had pelvic pressure. She has seen Dr Johney Maine in office and was doing pretty well. Over past three days she is leaking liquid stool. Has pressure in pelvis and pain that is now occurring. No fevers. Not eating much. No bleeding. No dysuria.  She underwent cbc with elevated wbc and ct that shows what appears to be abscess (this is not just perirectal as in report it is higher).  I was asked to see her.   Past Medical History:  Diagnosis Date  . Anemia   . Depression   . Gestational diabetes mellitus    gestational only  . H/O cesarean section 2010  . Headache    migraines  . Hemorrhoids   . Hypertension   . Panic attacks   . PTSD (post-traumatic stress disorder)   . Schizophrenia ([place])   . Vaginal delivery 2004 , 2008, 2009    Past Surgical History:  Procedure Laterality Date  . CESAREAN SECTION    . COLONOSCOPY WITH PROPOFOL  08/19/2017   Procedure: COLONOSCOPY WITH PROPOFOL;  Surgeon: Ileana Roup, MD;  Location: [facility];  Service: General;;  . COLONOSCOPY WITH PROPOFOL N/A 02/21/2018   Procedure: COLONOSCOPY WITH PROPOFOL;  Surgeon: Irene Shipper, MD;  Location: [facility];  Service: Endoscopy;  Laterality: N/A;  . EVALUATION UNDER ANESTHESIA WITH HEMORRHOIDECTOMY N/A 02/26/2018   Procedure: EXAM UNDER ANESTHESIA WITH HEMORRHOIDAL LIGATION AND PEXY, EXTERNAL HEMORRHOIDECTOMY X2;  Surgeon: Michael Boston, MD;  Location: [facility];  Service: General;  Laterality: N/A;  . FLEXIBLE SIGMOIDOSCOPY  09/25/2017   Procedure: FLEXIBLE SIGMOIDOSCOPY;  Surgeon: Ileana Roup, MD;  Location: [facility];  Service:  General;;  . i and d perirectal abcess  2016  . INCISION AND DRAINAGE PERIRECTAL ABSCESS N/A 05/28/2015   Procedure: IRRIGATION AND DEBRIDEMENT PERIRECTAL ABSCESS;  Surgeon: Armandina Gemma, MD;  Location: [facility];  Service: General;  Laterality: N/A;  . IR RADIOLOGIST EVAL & MGMT  04/02/2017  . TUBAL LIGATION  2010  . XI ROBOTIC ASSISTED LOWER ANTERIOR RESECTION N/A 02/26/2018   Procedure: XI ROBOTIC ASSISTED LYSIS OF ADHESIONS, RETRORECTAL RESECTION OF PRESACRAL CYSTIC MASS, FIREFLY INJECTION;  Surgeon: Michael Boston, MD;  Location: [facility];  Service: General;  Laterality: N/A;    Family History  Problem Relation Age of Onset  . Hypertension Mother   . Hypertension Other    Social History:  reports that she has been smoking cigarettes.  She has been smoking about 0.50 packs per day. She has never used smokeless tobacco. She reports that she drinks alcohol. She reports that she has current or past drug history. Drug: Marijuana. Frequency: 3.00 times per week.  Allergies: No Known Allergies  meds reviewed   Results for orders placed or performed during the hospital encounter of 04/06/18 (from the past 48 hour(s))  CBC     Status: Abnormal   Collection Time: 04/06/18  5:01 PM  Result Value Ref Range   WBC 21.4 (H) 4.0 - 10.5 K/uL   RBC 3.61 (L) 3.87 - 5.11 MIL/uL   Hemoglobin 11.1 (L) 12.0 - 15.0 g/dL   HCT 34.5 (L)  36.0 - 46.0 %   MCV 95.6 78.0 - 100.0 fL   MCH 30.7 26.0 - 34.0 pg   MCHC 32.2 30.0 - 36.0 g/dL   RDW 14.6 11.5 - 15.5 %   Platelets 261 150 - 400 K/uL    Comment: Performed at [facility], [place] [address], [place], [place] [address]  Comprehensive metabolic panel     Status: Abnormal   Collection Time: 04/06/18  5:01 PM  Result Value Ref Range   Sodium 137 135 - 145 mmol/L   Potassium 3.9 3.5 - 5.1 mmol/L   Chloride 96 (L) 98 - 111 mmol/L    Comment: Please note change in reference range.   CO2 28 22 - 32 mmol/L   Glucose, Bld 86 70 - 99 mg/dL     Comment: Please note change in reference range.   BUN 10 6 - 20 mg/dL    Comment: Please note change in reference range.   Creatinine, Ser 0.44 0.44 - 1.00 mg/dL   Calcium 8.9 8.9 - 10.3 mg/dL   Total Protein 8.1 6.5 - 8.1 g/dL   Albumin 3.6 3.5 - 5.0 g/dL   AST 20 15 - 41 U/L   ALT 13 0 - 44 U/L    Comment: Please note change in reference range.   Alkaline Phosphatase 93 38 - 126 U/L   Total Bilirubin 1.0 0.3 - 1.2 mg/dL   GFR calc non Af Amer >60 >60 mL/min   GFR calc Af Amer >60 >60 mL/min    Comment: (NOTE) The eGFR has been calculated using the CKD EPI equation. This calculation has not been validated in all clinical situations. eGFR's persistently <60 mL/min signify possible Chronic Kidney Disease.    Anion gap 13 5 - 15    Comment: Performed at [facility], [place] [address] [place], [place] [address]  I-Stat beta hCG blood, ED     Status: None   Collection Time: 04/06/18  5:08 PM  Result Value Ref Range   I-stat hCG, quantitative <5.0 <5 mIU/mL   Comment 3            Comment:   GEST. AGE      CONC.  (mIU/mL)   <=1 WEEK        5 - 50     2 WEEKS       50 - 500     3 WEEKS       100 - 10,000     4 WEEKS     1,000 - 30,000        FEMALE AND NON-PREGNANT FEMALE:     LESS THAN 5 mIU/mL   Urinalysis, Routine w reflex microscopic     Status: Abnormal   Collection Time: 04/06/18  6:00 PM  Result Value Ref Range   Color, Urine AMBER (A) YELLOW    Comment: BIOCHEMICALS MAY BE AFFECTED BY COLOR   APPearance CLEAR CLEAR   Specific Gravity, Urine >1.046 (H) 1.005 - 1.030   pH 5.0 5.0 - 8.0   Glucose, UA NEGATIVE NEGATIVE mg/dL   Hgb urine dipstick LARGE (A) NEGATIVE   Bilirubin Urine NEGATIVE NEGATIVE   Ketones, ur 80 (A) NEGATIVE mg/dL   Protein, ur 100 (A) NEGATIVE mg/dL   Nitrite NEGATIVE NEGATIVE   Leukocytes, UA TRACE (A) NEGATIVE   RBC / HPF 0-5 0 - 5 RBC/hpf   WBC, UA 6-10 0 - 5 WBC/hpf   Bacteria, UA RARE (A) NONE SEEN  Squamous Epithelial /  LPF 6-10 0 - 5   Mucus PRESENT    Hyaline Casts, UA PRESENT     Comment: Performed at [facility], [place] [address] [place], [place] [address]   Ct Abdomen Pelvis W Contrast  Result Date: 04/06/2018 CLINICAL DATA:  Pelvic pain, rectal abscess recently. EXAM: CT ABDOMEN AND PELVIS WITH CONTRAST TECHNIQUE: Multidetector CT imaging of the abdomen and pelvis was performed using the standard protocol following bolus administration of intravenous contrast. CONTRAST:  149m ISOVUE-300 IOPAMIDOL (ISOVUE-300) INJECTION 61% COMPARISON:  02/19/2018 FINDINGS: Lower chest: Normal size heart. Minimal dependent bibasilar atelectasis. No active pulmonary disease. Hepatobiliary: No focal liver abnormality is seen. No gallstones, gallbladder wall thickening, or biliary dilatation. Pancreas: Unremarkable. No pancreatic ductal dilatation or surrounding inflammatory changes. Spleen: Normal in size without focal abnormality. Adrenals/Urinary Tract: Normal bilateral adrenal glands and kidneys. No hydroureteronephrosis. Physiologic distention of the urinary bladder. Stomach/Bowel: Complex enhancing U-shaped fluid collection surrounding the rectum consistent with recurrence of perirectal abscess measuring up to 2.5 cm in thickness, series 2/76. Decompressed stomach with normal small bowel rotation. Fluid-filled small bowel loops leading up to the luminal narrowing of the rectum from positive mass effect from the perirectal abscess. Liquid stool is noted within with consistent diarrheal disease. Redemonstration of presacral soft tissue nodules possibly representing mildly enlarged reactive lymph nodes. These measure up to 12 mm. Vascular/Lymphatic: No significant vascular findings are present. Reproductive: Uterus and adnexa are unremarkable. Other: New supraumbilical ventral soft tissue induration likely representing postop scarring. No seroma or fluid collections at site of induration. No free air.  Musculoskeletal: No acute or significant osseous findings. IMPRESSION: 1. Recurrence of perirectal enhancing U shaped fluid collection surrounding the rectum consistent with a perirectal abscess measuring up to 2.5 cm in thickness posteriorly. 2. Stable nonspecific presacral soft tissue nodules that may reflect reactive lymph nodes. 3. Liquid stool within the moderately distended colon leading up to the rectum consistent with diarrheal disease. Electronically Signed   By: DAshley RoyaltyM.D.   On: 04/06/2018 18:17    Review of Systems  Constitutional: Positive for chills.  Gastrointestinal: Positive for diarrhea and nausea.  Musculoskeletal: Positive for back pain.  Psychiatric/Behavioral: Positive for depression.  All other systems reviewed and are negative.   Blood pressure (!) 136/93, pulse (!) 111, temperature 98.2 F (36.8 C), temperature source Oral, resp. rate 16, height '5\' 7"'  (1.702 m), weight 77.1 kg (170 lb), SpO2 100 %. Physical Exam  Vitals reviewed. Constitutional: She appears well-developed and well-nourished.  HENT:  Head: Normocephalic and atraumatic.  Right Ear: External ear normal.  Left Ear: External ear normal.  Mouth/Throat: Oropharynx is clear and moist.  Eyes: No scleral icterus.  Neck: Neck supple.  Cardiovascular: Normal rate and regular rhythm.  Respiratory: Effort normal and breath sounds normal.  GI: Soft. There is no tenderness.  Genitourinary:  Genitourinary Comments: Rectal exam not performed, she had suppository by er prior to my getting there and is in pain from that.  Has liquid stool leaking, no obvious abscess externally  Lymphadenopathy:    She has no cervical adenopathy.     Assessment/Plan Postoperative abscess S/p resection retrorectal cyst 6/5- Dr GJohney Maine-admission  -npo after midnight -start zosyn -discuss drainage with IR in am, this is not really something best drained operatively at this point -discussed plan with patient and her  family    MRolm Bookbinder MD 04/06/2018, 7:21 PM

## 2018-04-06 NOTE — ED Notes (Signed)
Bed: [facility] Expected date:  Expected time:  Means of arrival:  Comments: No Bed 

## 2018-04-06 NOTE — ED Notes (Signed)
ED TO INPATIENT HANDOFF REPORT  Name/Age/Gender Stephanie [name] 37 y.o. female  Code Status Code Status History    Date Active Date Inactive Code Status Order ID Comments User Context   02/19/2018 1701 02/28/2018 1717 Full Code [id number]  [facility] Roger ED   03/21/2017 1610 03/25/2017 1617 Full Code [id number]  Vivia Ewing, PA-C ED   05/29/2015 0211 06/04/2015 1711 Full Code [id number]  Armandina Gemma, MD Inpatient   05/28/2015 2147 05/29/2015 0211 Full Code [id number]  Armandina Gemma, MD ED      Home/SNF/Other Home  Chief Complaint abdominal pains  Level of Care/Admitting Diagnosis ED Disposition    ED Disposition Condition [facility] Area: [facility] [[id number]]  Level of Care: Med-Surg [16]  Diagnosis: Postoperative abscess [314970]  Admitting Physician: [place], [place]  Attending Physician: CCS, MD [[id number]]  Estimated length of stay: past midnight tomorrow  Certification:: I certify this patient will need inpatient services for at least 2 midnights  PT Class (Do Not Modify): Inpatient [101]  PT Acc Code (Do Not Modify): Private [1]       Medical History Past Medical History:  Diagnosis Date  . Anemia   . Depression   . Gestational diabetes mellitus    gestational only  . H/O cesarean section 2010  . Headache    migraines  . Hemorrhoids   . Hypertension   . Panic attacks   . PTSD (post-traumatic stress disorder)   . Schizophrenia ([place])   . Vaginal delivery 2004 , 2008, 2009    Allergies No Known Allergies  IV Location/Drains/Wounds Patient Lines/Drains/Airways Status   Active Line/Drains/Airways    Name:   Placement date:   Placement time:   Site:   Days:   Peripheral IV 04/06/18 Right Antecubital   04/06/18    1702    Antecubital   less than 1   Closed System Drain Right Abdomen Bulb (JP) 19 Fr.   02/26/18    1730    Abdomen   39   Incision (Closed) 02/26/18 Rectum Other (Comment)   02/26/18    1808     39   Incision - 4  Ports Abdomen Right;Lower;Lateral Right;Lateral Right Left   02/26/18    1500     39          Labs/Imaging Results for orders placed or performed during the hospital encounter of 04/06/18 (from the past 48 hour(s))  CBC     Status: Abnormal   Collection Time: 04/06/18  5:01 PM  Result Value Ref Range   WBC 21.4 (H) 4.0 - 10.5 K/uL   RBC 3.61 (L) 3.87 - 5.11 MIL/uL   Hemoglobin 11.1 (L) 12.0 - 15.0 g/dL   HCT 34.5 (L) 36.0 - 46.0 %   MCV 95.6 78.0 - 100.0 fL   MCH 30.7 26.0 - 34.0 pg   MCHC 32.2 30.0 - 36.0 g/dL   RDW 14.6 11.5 - 15.5 %   Platelets 261 150 - 400 K/uL    Comment: Performed at [facility], [place] [address] [place], [place] [address]  Comprehensive metabolic panel     Status: Abnormal   Collection Time: 04/06/18  5:01 PM  Result Value Ref Range   Sodium 137 135 - 145 mmol/L   Potassium 3.9 3.5 - 5.1 mmol/L   Chloride 96 (L) 98 - 111 mmol/L    Comment: Please note change in reference range.   CO2 28 22 -  32 mmol/L   Glucose, Bld 86 70 - 99 mg/dL    Comment: Please note change in reference range.   BUN 10 6 - 20 mg/dL    Comment: Please note change in reference range.   Creatinine, Ser 0.44 0.44 - 1.00 mg/dL   Calcium 8.9 8.9 - 10.3 mg/dL   Total Protein 8.1 6.5 - 8.1 g/dL   Albumin 3.6 3.5 - 5.0 g/dL   AST 20 15 - 41 U/L   ALT 13 0 - 44 U/L    Comment: Please note change in reference range.   Alkaline Phosphatase 93 38 - 126 U/L   Total Bilirubin 1.0 0.3 - 1.2 mg/dL   GFR calc non Af Amer >60 >60 mL/min   GFR calc Af Amer >60 >60 mL/min    Comment: (NOTE) The eGFR has been calculated using the CKD EPI equation. This calculation has not been validated in all clinical situations. eGFR's persistently <60 mL/min signify possible Chronic Kidney Disease.    Anion gap 13 5 - 15    Comment: Performed at [facility], [place] [address], [place], [place] [address]  I-Stat beta hCG blood, ED     Status: None   Collection Time:  04/06/18  5:08 PM  Result Value Ref Range   I-stat hCG, quantitative <5.0 <5 mIU/mL   Comment 3            Comment:   GEST. AGE      CONC.  (mIU/mL)   <=1 WEEK        5 - 50     2 WEEKS       50 - 500     3 WEEKS       100 - 10,000     4 WEEKS     1,000 - 30,000        FEMALE AND NON-PREGNANT FEMALE:     LESS THAN 5 mIU/mL   Urinalysis, Routine w reflex microscopic     Status: Abnormal   Collection Time: 04/06/18  6:00 PM  Result Value Ref Range   Color, Urine AMBER (A) YELLOW    Comment: BIOCHEMICALS MAY BE AFFECTED BY COLOR   APPearance CLEAR CLEAR   Specific Gravity, Urine >1.046 (H) 1.005 - 1.030   pH 5.0 5.0 - 8.0   Glucose, UA NEGATIVE NEGATIVE mg/dL   Hgb urine dipstick LARGE (A) NEGATIVE   Bilirubin Urine NEGATIVE NEGATIVE   Ketones, ur 80 (A) NEGATIVE mg/dL   Protein, ur 100 (A) NEGATIVE mg/dL   Nitrite NEGATIVE NEGATIVE   Leukocytes, UA TRACE (A) NEGATIVE   RBC / HPF 0-5 0 - 5 RBC/hpf   WBC, UA 6-10 0 - 5 WBC/hpf   Bacteria, UA RARE (A) NONE SEEN   Squamous Epithelial / LPF 6-10 0 - 5   Mucus PRESENT    Hyaline Casts, UA PRESENT     Comment: Performed at [facility], [place] [address], [place], [place] [address]   Ct Abdomen Pelvis W Contrast  Result Date: 04/06/2018 CLINICAL DATA:  Pelvic pain, rectal abscess recently. EXAM: CT ABDOMEN AND PELVIS WITH CONTRAST TECHNIQUE: Multidetector CT imaging of the abdomen and pelvis was performed using the standard protocol following bolus administration of intravenous contrast. CONTRAST:  13m ISOVUE-300 IOPAMIDOL (ISOVUE-300) INJECTION 61% COMPARISON:  02/19/2018 FINDINGS: Lower chest: Normal size heart. Minimal dependent bibasilar atelectasis. No active pulmonary disease. Hepatobiliary: No focal liver abnormality is seen. No gallstones, gallbladder wall thickening, or biliary  dilatation. Pancreas: Unremarkable. No pancreatic ductal dilatation or surrounding inflammatory changes. Spleen: Normal in size without  focal abnormality. Adrenals/Urinary Tract: Normal bilateral adrenal glands and kidneys. No hydroureteronephrosis. Physiologic distention of the urinary bladder. Stomach/Bowel: Complex enhancing U-shaped fluid collection surrounding the rectum consistent with recurrence of perirectal abscess measuring up to 2.5 cm in thickness, series 2/76. Decompressed stomach with normal small bowel rotation. Fluid-filled small bowel loops leading up to the luminal narrowing of the rectum from positive mass effect from the perirectal abscess. Liquid stool is noted within with consistent diarrheal disease. Redemonstration of presacral soft tissue nodules possibly representing mildly enlarged reactive lymph nodes. These measure up to 12 mm. Vascular/Lymphatic: No significant vascular findings are present. Reproductive: Uterus and adnexa are unremarkable. Other: New supraumbilical ventral soft tissue induration likely representing postop scarring. No seroma or fluid collections at site of induration. No free air. Musculoskeletal: No acute or significant osseous findings. IMPRESSION: 1. Recurrence of perirectal enhancing U shaped fluid collection surrounding the rectum consistent with a perirectal abscess measuring up to 2.5 cm in thickness posteriorly. 2. Stable nonspecific presacral soft tissue nodules that may reflect reactive lymph nodes. 3. Liquid stool within the moderately distended colon leading up to the rectum consistent with diarrheal disease. Electronically Signed   By: Ashley Royalty M.D.   On: 04/06/2018 18:17    Pending Labs [employer] (From admission, onward)   Start     Ordered   Signed and Held  Creatinine, serum  (enoxaparin (LOVENOX)    CrCl >/= 30 ml/min)  Weekly,   R    Comments:  while on enoxaparin therapy    Signed and Held   Signed and Held  Urinalysis, Routine w reflex microscopic  Once,   R     Signed and Held   Signed and Held  Basic metabolic panel  Tomorrow morning,   R     Signed and Held    Signed and Held  CBC  Tomorrow morning,   R     Signed and Held   Signed and Held  Protime-INR  Tomorrow morning,   R     Signed and Held      Vitals/Pain Today's Vitals   04/06/18 1850 04/06/18 1930 04/06/18 2000 04/06/18 2030  BP: (!) 136/93  123/84 121/76  Pulse: (!) 111  96 94  Resp: 16     Temp:      TempSrc:      SpO2: 100%  96% 97%  Weight:      Height:      PainSc:  8       Isolation Precautions No active isolations  Medications Medications  iopamidol (ISOVUE-300) 61 % injection (has no administration in time range)  amLODipine (NORVASC) tablet 5 mg (has no administration in time range)  citalopram (CELEXA) tablet 10 mg (has no administration in time range)  gabapentin (NEURONTIN) capsule 300 mg (has no administration in time range)  hydrOXYzine (ATARAX/VISTARIL) tablet 10 mg (has no administration in time range)  psyllium (HYDROCIL/METAMUCIL) packet 1 packet (has no administration in time range)  SUMAtriptan (IMITREX) tablet 25 mg (has no administration in time range)  opium-belladonna (B&O SUPPRETTES) 16.2-60 MG suppository 1 suppository (1 suppository Rectal Given 04/06/18 1655)  iopamidol (ISOVUE-300) 61 % injection 100 mL (100 mLs Intravenous Contrast Given 04/06/18 1758)  ondansetron (ZOFRAN) injection 4 mg (4 mg Intravenous Given 04/06/18 1942)  sodium chloride 0.9 % bolus 1,000 mL (1,000 mLs Intravenous New Bag/Given 04/06/18 1939)  HYDROmorphone (DILAUDID)  injection 1 mg (1 mg Intravenous Given 04/06/18 1943)    Mobility walks

## 2018-04-06 NOTE — ED Provider Notes (Signed)
[facility]-EMERGENCY DEPT Provider Note   CSN: [id number] Arrival date & time: 04/06/18  1454     History   Chief Complaint Chief Complaint  Patient presents with  . Abdominal Pain  . Constipation    HPI Stephanie [name] is a 37 y.o. 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 Pain is constant, 10/10 in severity, worse with palpation and with bowel movement, no alleviating factors. Has taken her prescribed naproxen and oxycodone without relief. She reports her last BM was somewhat hard 2 days ago. She has taken Metamucil and Mag citrate without change in her sxs and without bowel movement. She states she feels something is inside of her rectum. She states she had similar much less severe pain when she saw Dr. Michaell Cowing in the office within past week or so, this is much more severe though. She had small amount of blood on the toilet paper. Denies fever, chills, nausea, vomiting, or dysuria.     HPI  Past Medical History:  Diagnosis Date  . Anemia   . Depression   . Gestational diabetes mellitus    gestational only  . H/O cesarean section 2010  . Headache    migraines  . Hemorrhoids   . Hypertension   . Panic attacks   . PTSD (post-traumatic stress disorder)   . Schizophrenia (HCC)   . Vaginal delivery 2004 , 2008, 2009    Patient Active Problem List   Diagnosis Date Noted  . 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 External hemorrhoids status post hemorrhoidectomy x2, 02/26/2018 02/26/2018  . Hypokalemia 02/25/2018  . Hypomagnesemia 02/25/2018  . Panic disorder 02/22/2018  . Pelvic pain 02/21/2018  . Schizophrenia  (HCC)   . Panic attacks   . Hypertension   . PTSD (post-traumatic stress disorder)   . Constipation, chronic 02/20/2018  . Sweaty palms 10/25/2017  . Encounter to establish care 10/25/2017  . Depression 10/25/2017  . 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 Prolapsed internal hemorrhoids, grade 2&3, s/p hemorrhoidal ligation/pexy 02/26/2018 05/01/2013    Past Surgical History:  Procedure Laterality Date  . CESAREAN SECTION    . COLONOSCOPY WITH PROPOFOL  08/19/2017   Procedure: COLONOSCOPY WITH PROPOFOL;  Surgeon: Andria Meuse, MD;  Location: [facility];  Service: General;;  . COLONOSCOPY WITH PROPOFOL N/A 02/21/2018   Procedure: COLONOSCOPY WITH PROPOFOL;  Surgeon: Hilarie Fredrickson, MD;  Location: [facility];  Service: Endoscopy;  Laterality: N/A;  . EVALUATION UNDER ANESTHESIA WITH HEMORRHOIDECTOMY N/A 02/26/2018   Procedure: EXAM UNDER ANESTHESIA WITH HEMORRHOIDAL LIGATION AND PEXY, EXTERNAL HEMORRHOIDECTOMY X2;  Surgeon: Karie Soda, MD;  Location: [facility];  Service: General;  Laterality: N/A;  . FLEXIBLE SIGMOIDOSCOPY  09/25/2017   Procedure: FLEXIBLE SIGMOIDOSCOPY;  Surgeon: Andria Meuse, MD;  Location: [facility];  Service: General;;  . i and d perirectal abcess  2016  . INCISION AND DRAINAGE PERIRECTAL ABSCESS N/A 05/28/2015   Procedure: IRRIGATION AND DEBRIDEMENT PERIRECTAL ABSCESS;  Surgeon: Darnell Level, MD;  Location: [facility];  Service: General;  Laterality: N/A;  . IR RADIOLOGIST EVAL & MGMT  04/02/2017  . TUBAL LIGATION  2010  . XI ROBOTIC ASSISTED LOWER ANTERIOR RESECTION  N/A 02/26/2018   Procedure: XI ROBOTIC ASSISTED LYSIS OF ADHESIONS, RETRORECTAL RESECTION OF PRESACRAL CYSTIC MASS, FIREFLY INJECTION;  Surgeon: Karie Soda, MD;  Location: [facility];  Service: General;  Laterality: N/A;     OB History    Gravida  4   Para  4   Term  3   Preterm  1   AB      Living  5     SAB      TAB      Ectopic      Multiple      Live Births                 Home Medications    Prior to Admission medications   Medication Sig Start Date End Date Taking? Authorizing Provider  acetaminophen (TYLENOL) 500 MG tablet Take 2 tablets (1,000 mg total) by mouth every 8 (eight) hours. 02/27/18   Adam Phenix, PA-C  amLODipine (NORVASC) 5 MG tablet TK 1 T PO EACH DAY 02/03/18   [provider]  Aspirin-Salicylamide-Caffeine (BC HEADACHE POWDER PO) Take 1 packet daily as needed by mouth (headaches).    [provider]  celecoxib (CELEBREX) 200 MG capsule Take 200 mg by mouth daily. 01/18/18   [provider]  citalopram (CELEXA) 10 MG tablet Take 1 tablet (10 mg total) by mouth daily. 02/27/18   Karie Soda, MD  cyclobenzaprine (FLEXERIL) 10 MG tablet TK ONE T PO D PRN FOR SPASMS 12/24/17   [provider]  gabapentin (NEURONTIN) 300 MG capsule Take 1 capsule (300 mg total) by mouth 2 (two) times daily. 02/26/18   Karie Soda, MD  hydrOXYzine (ATARAX/VISTARIL) 10 MG tablet Take 1 tablet (10 mg total) by mouth 2 (two) times daily. 02/26/18   Karie Soda, MD  meloxicam (MOBIC) 15 MG tablet Take 15 mg by mouth daily as needed (for pain/inflammation.).    [provider]  Multiple Vitamins-Iron (MULTIVITAMINS WITH IRON) TABS tablet Take 1 tablet by mouth daily. 02/28/18   Meuth, Brooke A, PA-C  oxyCODONE (OXY IR/ROXICODONE) 5 MG immediate release tablet Take 1 tablet (5 mg total) by mouth every 6 (six) hours as needed for severe pain. 02/28/18   Meuth, Brooke A, PA-C  psyllium (HYDROCIL/METAMUCIL) 95 % PACK Take 1 packet by mouth 2 (two) times daily. 02/27/18   Adam Phenix, PA-C  senna-docusate (SENOKOT-S) 8.6-50 MG tablet Take 1 tablet by mouth 2 (two) times daily. 02/26/18   Karie Soda, MD  SUMAtriptan (IMITREX) 25 MG tablet Take 25 mg every 2 (two) hours as needed by mouth for migraine. 07/15/17   [provider]    Family History Family History  Problem Relation Age of Onset  .  Hypertension Mother   . Hypertension Other     Social History Social History   Tobacco Use  . Smoking status: Current Every Day Smoker    Packs/day: 0.50    Types: Cigarettes  . Smokeless tobacco: Never Used  Substance Use Topics  . Alcohol use: Yes    Comment: ocassionally  . Drug use: Yes    Frequency: 3.0 times per week    Types: Marijuana    Comment: occasionally     Allergies   Patient has no known allergies.   Review of Systems Review of Systems  Constitutional: Negative for chills and fever.  Respiratory: Negative for shortness of breath.   Cardiovascular: Negative for chest pain.  Gastrointestinal: Positive for abdominal pain, anal bleeding and constipation.  Negative for nausea and vomiting.       Positive for rectal pain.   Genitourinary: Negative for dysuria.  All other systems reviewed and are negative.    Physical Exam Updated Vital Signs BP 120/77 (BP Location: Left Arm)   Pulse (!) 110   Temp 98.2 F (36.8 C) (Oral)   Resp 18   Ht 5\' 7"  (1.702 m)   Wt 77.1 kg (170 lb)   SpO2 100%   BMI 26.63 kg/m   Physical Exam  Constitutional: She appears well-developed and well-nourished.  Non-toxic appearance. She appears distressed (mild secondary to pain, tearful).  HENT:  Head: Normocephalic and atraumatic.  Eyes: Conjunctivae are normal. Right eye exhibits no discharge. Left eye exhibits no discharge.  Cardiovascular: Regular rhythm. Tachycardia present.  No murmur heard. Pulmonary/Chest: Breath sounds normal. No respiratory distress. She has no wheezes. She has no rales.  Abdominal: Soft. She exhibits no distension. There is generalized tenderness (mild). There is no rigidity, no rebound, no guarding and no CVA tenderness.  Genitourinary: Rectal exam shows external hemorrhoid and tenderness. Rectal exam shows no mass. Pelvic exam was performed with patient in the knee-chest position.  Genitourinary Comments: Patient uncomfortable throughout rectal  exam. No obvious palpable fluctuance appreciated. RN Karleen HampshireSpencer present as chaperone.   Neurological: She is alert.  Clear speech.   Skin: Skin is warm and dry. No rash noted.  Psychiatric: She has a normal mood and affect. Her behavior is normal.  Nursing note and vitals reviewed.   ED Treatments / Results  Labs Results for orders placed or performed during the hospital encounter of 04/06/18  CBC  Result Value Ref Range   WBC 21.4 (H) 4.0 - 10.5 K/uL   RBC 3.61 (L) 3.87 - 5.11 MIL/uL   Hemoglobin 11.1 (L) 12.0 - 15.0 g/dL   HCT 04.534.5 (L) 40.936.0 - 81.146.0 %   MCV 95.6 78.0 - 100.0 fL   MCH 30.7 26.0 - 34.0 pg   MCHC 32.2 30.0 - 36.0 g/dL   RDW 91.414.6 78.211.5 - 95.615.5 %   Platelets 261 150 - 400 K/uL  Comprehensive metabolic panel  Result Value Ref Range   Sodium 137 135 - 145 mmol/L   Potassium 3.9 3.5 - 5.1 mmol/L   Chloride 96 (L) 98 - 111 mmol/L   CO2 28 22 - 32 mmol/L   Glucose, Bld 86 70 - 99 mg/dL   BUN 10 6 - 20 mg/dL   Creatinine, Ser 2.130.44 0.44 - 1.00 mg/dL   Calcium 8.9 8.9 - 08.610.3 mg/dL   Total Protein 8.1 6.5 - 8.1 g/dL   Albumin 3.6 3.5 - 5.0 g/dL   AST 20 15 - 41 U/L   ALT 13 0 - 44 U/L   Alkaline Phosphatase 93 38 - 126 U/L   Total Bilirubin 1.0 0.3 - 1.2 mg/dL   GFR calc non Af Amer >60 >60 mL/min   GFR calc Af Amer >60 >60 mL/min   Anion gap 13 5 - 15  Urinalysis, Routine w reflex microscopic  Result Value Ref Range   Color, Urine AMBER (A) YELLOW   APPearance CLEAR CLEAR   Specific Gravity, Urine >1.046 (H) 1.005 - 1.030   pH 5.0 5.0 - 8.0   Glucose, UA NEGATIVE NEGATIVE mg/dL   Hgb urine dipstick LARGE (A) NEGATIVE   Bilirubin Urine NEGATIVE NEGATIVE   Ketones, ur 80 (A) NEGATIVE mg/dL   Protein, ur 578100 (A) NEGATIVE mg/dL   Nitrite NEGATIVE NEGATIVE   Leukocytes,  UA TRACE (A) NEGATIVE   RBC / HPF 0-5 0 - 5 RBC/hpf   WBC, UA 6-10 0 - 5 WBC/hpf   Bacteria, UA RARE (A) NONE SEEN   Squamous Epithelial / LPF 6-10 0 - 5   Mucus PRESENT    Hyaline Casts, UA  PRESENT   I-Stat beta hCG blood, ED  Result Value Ref Range   I-stat hCG, quantitative <5.0 <5 mIU/mL   Comment 3           EKG None  Radiology Ct Abdomen Pelvis W Contrast  Result Date: 04/06/2018 CLINICAL DATA:  Pelvic pain, rectal abscess recently. EXAM: CT ABDOMEN AND PELVIS WITH CONTRAST TECHNIQUE: Multidetector CT imaging of the abdomen and pelvis was performed using the standard protocol following bolus administration of intravenous contrast. CONTRAST:  ISOVUE-300 IOPAMIDOL (ISOVUE-300) INJECTION 61% COMPARISON:  02/19/2018 FINDINGS: Lower chest: Normal size heart. Minimal dependent bibasilar atelectasis. No active pulmonary disease. Hepatobiliary: No focal liver abnormality is seen. No gallstones, gallbladder wall thickening, or biliary dilatation. Pancreas: Unremarkable. No pancreatic ductal dilatation or surrounding inflammatory changes. Spleen: Normal in size without focal abnormality. Adrenals/Urinary Tract: Normal bilateral adrenal glands and kidneys. No hydroureteronephrosis. Physiologic distention of the urinary bladder. Stomach/Bowel: Complex enhancing U-shaped fluid collection surrounding the rectum consistent with recurrence of perirectal abscess measuring up to 2.5 cm in thickness, series 2/76. Decompressed stomach with normal small bowel rotation. Fluid-filled small bowel loops leading up to the luminal narrowing of the rectum from positive mass effect from the perirectal abscess. Liquid stool is noted within with consistent diarrheal disease. Redemonstration of presacral soft tissue nodules possibly representing mildly enlarged reactive lymph nodes. These measure up to 12 mm. Vascular/Lymphatic: No significant vascular findings are present. Reproductive: Uterus and adnexa are unremarkable. Other: New supraumbilical ventral soft tissue induration likely representing postop scarring. No seroma or fluid collections at site of induration. No free air. Musculoskeletal: No acute or  significant osseous findings. IMPRESSION: 1. Recurrence of perirectal enhancing U shaped fluid collection surrounding the rectum consistent with a perirectal abscess measuring up to 2.5 cm in thickness posteriorly. 2. Stable nonspecific presacral soft tissue nodules that may reflect reactive lymph nodes. 3. Liquid stool within the moderately distended colon leading up to the rectum consistent with diarrheal disease. Electronically Signed   By: Tollie Eth M.D.   On: 04/06/2018 18:17    Procedures Procedures (including critical care time)  Medications Ordered in ED Medications - No data to display   Initial Impression / Assessment and Plan / ED Course  I have reviewed the triage vital signs and the nursing notes.  Pertinent labs & imaging results that were available during my care of the patient were reviewed by me and considered in my medical decision making (see chart for details).   Patient with fairly recent abdominal surgical procedure presents with rectal pain, lower abdominal discomfort, and constipation. Patient nontoxic appearing, tearful, vitals WNL other than elevated HR. Patient diffusely mildly tender to abdomen without peritoneal signs. Rectal exam notable for external hemorrhoid and diffuse tenderness. No appreciable palpable fluctuance on perirectal/perianal area. Will obtain labs and CT abdomen/pelvis w contrast. Topical ointment for rectal pain.   Patient's work up notable for leukocytosis at 21.4. Anemia with hgb 11.1- this is improved from previous. Mild hypochloremia at 96. UA does not appear consistent with UTI, but does appear consistent with dehydration. CT abdomen/pelvis-  Recurrence of perirectal enhancing U shaped fluid collection surrounding the rectum consistent with a perirectal abscess measuring up to  2.5 cm in thickness posteriorly. 2. Stable nonspecific presacral soft tissue nodules that may reflect reactive lymph nodes. 3. Liquid stool within the moderately distended  colon leading up to the rectum consistent with diarrheal disease.  18:38: RE-EVAL: Patient states initially topical analgesics helped with discomfort, but has worn off.  Rates current pain a 6/10 in severity. She has had multiple bowel movements while in the ER.   Will give IV fluids, dilaudid, and zofran and consult general surgery.   19:45:CONSULT: Discussed case with general surgeon Dr. Dwain Sarna who has seen the patient and accepts admission.   Findings and plan of care discussed with supervising physician Dr. Jeraldine Loots who is in agreement with plan.   Final Clinical Impressions(s) / ED Diagnoses   Final diagnoses:  Perirectal abscess    ED Discharge Orders    None       Cherly Anderson, PA-C 04/06/18 1947    Cathren Laine, MD 04/07/18 1222

## 2018-04-07 ENCOUNTER — Inpatient Hospital Stay (HOSPITAL_COMMUNITY): Payer: Medicaid Other

## 2018-04-07 DIAGNOSIS — Z91199 Patient's noncompliance with other medical treatment and regimen due to unspecified reason: Secondary | ICD-10-CM

## 2018-04-07 DIAGNOSIS — Z9119 Patient's noncompliance with other medical treatment and regimen: Secondary | ICD-10-CM

## 2018-04-07 LAB — BASIC METABOLIC PANEL
Anion gap: 12 (ref 5–15)
BUN: 11 mg/dL (ref 6–20)
CALCIUM: 8.5 mg/dL — AB (ref 8.9–10.3)
CHLORIDE: 94 mmol/L — AB (ref 98–111)
CO2: 31 mmol/L (ref 22–32)
Creatinine, Ser: 0.54 mg/dL (ref 0.44–1.00)
GFR calc non Af Amer: >60 mL/min (ref >60)
Glucose, Bld: 108 mg/dL — ABNORMAL HIGH (ref 70–99)
Potassium: 3 mmol/L — ABNORMAL LOW (ref 3.5–5.1)
SODIUM: 137 mmol/L (ref 135–145)

## 2018-04-07 LAB — CBC
HCT: 34.1 % — ABNORMAL LOW (ref 36.0–46.0)
HEMOGLOBIN: 10.6 g/dL — AB (ref 12.0–15.0)
MCH: 29.9 pg (ref 26.0–34.0)
MCHC: 31.1 g/dL (ref 30.0–36.0)
MCV: 96.1 fL (ref 78.0–100.0)
Platelets: 276 10*3/uL (ref 150–400)
RBC: 3.55 MIL/uL — AB (ref 3.87–5.11)
RDW: 14.9 % (ref 11.5–15.5)
WBC: 20.3 10*3/uL — AB (ref 4.0–10.5)

## 2018-04-07 LAB — PROTIME-INR
INR: 1.09
Prothrombin Time: 14 seconds (ref 11.4–15.2)

## 2018-04-07 MED ORDER — SODIUM CHLORIDE 0.45 % IV SOLN
INTRAVENOUS | Status: DC
  Filled 2018-04-07: qty 1000

## 2018-04-07 MED ORDER — LIDOCAINE HCL (PF) 1 % IJ SOLN
INTRAMUSCULAR | Status: AC | PRN
  Administered 2018-04-07: 20 mL

## 2018-04-07 MED ORDER — POTASSIUM CHLORIDE IN NACL 20-0.45 MEQ/L-% IV SOLN
INTRAVENOUS | Status: DC
  Administered 2018-04-07: 14:00:00 via INTRAVENOUS
  Administered 2018-04-07: 1000 mL via INTRAVENOUS
  Filled 2018-04-07 (×3): qty 1000

## 2018-04-07 MED ORDER — LIP MEDEX EX OINT
TOPICAL_OINTMENT | CUTANEOUS | Status: AC
  Filled 2018-04-07: qty 7

## 2018-04-07 MED ORDER — FLUCONAZOLE IN SODIUM CHLORIDE 400-0.9 MG/200ML-% IV SOLN
800.0000 mg | Freq: Once | INTRAVENOUS | Status: AC
  Administered 2018-04-07: 800 mg via INTRAVENOUS
  Filled 2018-04-07: qty 400

## 2018-04-07 MED ORDER — SODIUM CHLORIDE 0.9% FLUSH
5.0000 mL | Freq: Three times a day (TID) | INTRAVENOUS | Status: DC
  Administered 2018-04-07 – 2018-04-08 (×3): 5 mL

## 2018-04-07 MED ORDER — FENTANYL CITRATE (PF) 100 MCG/2ML IJ SOLN
INTRAMUSCULAR | Status: AC
  Filled 2018-04-07: qty 4

## 2018-04-07 MED ORDER — FENTANYL CITRATE (PF) 100 MCG/2ML IJ SOLN
INTRAMUSCULAR | Status: AC | PRN
  Administered 2018-04-07 (×2): 50 ug via INTRAVENOUS

## 2018-04-07 MED ORDER — MIDAZOLAM HCL 2 MG/2ML IJ SOLN
INTRAMUSCULAR | Status: AC
  Filled 2018-04-07: qty 6

## 2018-04-07 MED ORDER — ENOXAPARIN SODIUM 40 MG/0.4ML ~~LOC~~ SOLN
40.0000 mg | SUBCUTANEOUS | Status: DC
  Filled 2018-04-07: qty 0.4

## 2018-04-07 MED ORDER — MIDAZOLAM HCL 2 MG/2ML IJ SOLN
INTRAMUSCULAR | Status: AC
  Filled 2018-04-07: qty 2

## 2018-04-07 MED ORDER — FLUCONAZOLE IN SODIUM CHLORIDE 400-0.9 MG/200ML-% IV SOLN
400.0000 mg | INTRAVENOUS | Status: DC
  Filled 2018-04-07: qty 200

## 2018-04-07 MED ORDER — MIDAZOLAM HCL 2 MG/2ML IJ SOLN
INTRAMUSCULAR | Status: AC | PRN
  Administered 2018-04-07: 1 mg via INTRAVENOUS
  Administered 2018-04-07: 2 mg via INTRAVENOUS
  Administered 2018-04-07: 1 mg via INTRAVENOUS

## 2018-04-07 NOTE — Procedures (Signed)
  Procedure: CT perc drain placement perirectal abscess 5ml purulent fluid sent for GS, C&S EBL:   minimal Complications:  none immediate  See full dictation in [facility] PACS.  Thora Lance. Joyce Heitman MD Main # [phone] Pager  [phone]

## 2018-04-07 NOTE — Progress Notes (Signed)
MEDICATION-RELATED CONSULT NOTE   IR Procedure Consult - Anticoagulant/Antiplatelet PTA/Inpatient Med List Review by Pharmacist    Procedure: CT perc drain placement perirectal    Completed: 7/15 1400  Post-Procedural bleeding risk per IR MD assessment:  STANDARD  Antithrombotic medications on inpatient or PTA profile prior to procedure:   Enoxaparin 40 mg SQ daily ordered to start this afternoon    Recommended restart time per IR Post-Procedure Guidelines:  AM POD1   Other considerations:   none   Plan:     Retime enoxaparin to start tomorrow AM, 7/16  Bernadene Personrew Stephanie [name], PharmD, BCPS [phone] 04/07/2018, 2:29 PM

## 2018-04-07 NOTE — Consult Note (Signed)
Chief Complaint: Patient was seen in consultation today for intra-abdominal fluid collection  Referring Physician(s): Dr. Dwain Sarna  Supervising Physician: Oley Balm  Patient Status: [facility] - In-pt  History of Present Illness: Stephanie [name] is a 37 y.o. female with past medical history of a depression, anxiety, anemia, HTN, schizophrenia, and recurrent rectal/peri-rectal abscesses. She recently underwent retrorectal cystectomy 02/26/18 with Dr. Michaell Cowing.  She presented to [facility] ED 04/06/18 with abdominal pain and constipation.  Abdominal CT showed intra-abdominal fluid collection concerning for abscess.  IR consulted for aspiration and drainage of fluid collection.  Case reviewed and approved by Dr. Deanne Coffer.   She has been NPO.  She does not take blood thinners.   Past Medical History:  Diagnosis Date  . Anemia   . Depression   . Gestational diabetes mellitus    gestational only  . H/O cesarean section 2010  . Headache    migraines  . Hemorrhoids   . Hypertension   . Panic attacks   . PTSD (post-traumatic stress disorder)   . Schizophrenia (HCC)   . Vaginal delivery 2004 , 2008, 2009    Past Surgical History:  Procedure Laterality Date  . CESAREAN SECTION    . COLONOSCOPY WITH PROPOFOL  08/19/2017   Procedure: COLONOSCOPY WITH PROPOFOL;  Surgeon: Andria Meuse, MD;  Location: [facility];  Service: General;;  . COLONOSCOPY WITH PROPOFOL N/A 02/21/2018   Procedure: COLONOSCOPY WITH PROPOFOL;  Surgeon: Hilarie Fredrickson, MD;  Location: [facility];  Service: Endoscopy;  Laterality: N/A;  . EVALUATION UNDER ANESTHESIA WITH HEMORRHOIDECTOMY N/A 02/26/2018   Procedure: EXAM UNDER ANESTHESIA WITH HEMORRHOIDAL LIGATION AND PEXY, EXTERNAL HEMORRHOIDECTOMY X2;  Surgeon: Karie Soda, MD;  Location: [facility];  Service: General;  Laterality: N/A;  . FLEXIBLE SIGMOIDOSCOPY  09/25/2017   Procedure: FLEXIBLE SIGMOIDOSCOPY;  Surgeon: Andria Meuse, MD;  Location: [facility];   Service: General;;  . i and d perirectal abcess  2016  . INCISION AND DRAINAGE PERIRECTAL ABSCESS N/A 05/28/2015   Procedure: IRRIGATION AND DEBRIDEMENT PERIRECTAL ABSCESS;  Surgeon: Darnell Level, MD;  Location: [facility];  Service: General;  Laterality: N/A;  . IR RADIOLOGIST EVAL & MGMT  04/02/2017  . TUBAL LIGATION  2010  . XI ROBOTIC ASSISTED LOWER ANTERIOR RESECTION N/A 02/26/2018   Procedure: XI ROBOTIC ASSISTED LYSIS OF ADHESIONS, RETRORECTAL RESECTION OF PRESACRAL CYSTIC MASS, FIREFLY INJECTION;  Surgeon: Karie Soda, MD;  Location: [facility];  Service: General;  Laterality: N/A;    Allergies: Patient has no known allergies.  Medications: Prior to Admission medications   Medication Sig Start Date End Date Taking? Authorizing Provider  acetaminophen (TYLENOL) 500 MG tablet Take 2 tablets (1,000 mg total) by mouth every 8 (eight) hours. 02/27/18  Yes Simaan, Francine Graven, PA-C  amLODipine (NORVASC) 5 MG tablet TK 1 T PO EACH DAY 02/03/18  Yes [provider]  Aspirin-Salicylamide-Caffeine (BC HEADACHE POWDER PO) Take 1 packet daily as needed by mouth (headaches).   Yes [provider]  celecoxib (CELEBREX) 200 MG capsule Take 200 mg by mouth daily. 01/18/18  Yes [provider]  citalopram (CELEXA) 10 MG tablet Take 1 tablet (10 mg total) by mouth daily. 02/27/18  Yes Karie Soda, MD  gabapentin (NEURONTIN) 300 MG capsule Take 1 capsule (300 mg total) by mouth 2 (two) times daily. 02/26/18  Yes Karie Soda, MD  hydrOXYzine (ATARAX/VISTARIL) 10 MG tablet Take 1 tablet (10 mg total) by mouth 2 (two) times daily. 02/26/18  Loletta Specter, MD  meloxicam (MOBIC) 15 MG tablet Take 15 mg by mouth daily as needed (for pain/inflammation.).   Yes [provider]  Multiple Vitamins-Iron (MULTIVITAMINS WITH IRON) TABS tablet Take 1 tablet by mouth daily. 02/28/18  Yes Meuth, Brooke A, PA-C  psyllium (HYDROCIL/METAMUCIL) 95 % PACK Take 1 packet by mouth 2 (two) times daily.  02/27/18  Yes Simaan, Francine Graven, PA-C  senna-docusate (SENOKOT-S) 8.6-50 MG tablet Take 1 tablet by mouth 2 (two) times daily. 02/26/18  Yes Karie Soda, MD  SUMAtriptan (IMITREX) 25 MG tablet Take 25 mg every 2 (two) hours as needed by mouth for migraine. 07/15/17  Yes [provider]  oxyCODONE (OXY IR/ROXICODONE) 5 MG immediate release tablet Take 1 tablet (5 mg total) by mouth every 6 (six) hours as needed for severe pain. Patient not taking: Reported on 04/06/2018 02/28/18   Franne Forts, PA-C     Family History  Problem Relation Age of Onset  . Hypertension Mother   . Hypertension Other     Social History   Socioeconomic History  . Marital status: Single    Spouse name: Not on file  . Number of children: Not on file  . Years of education: Not on file  . Highest education level: Not on file  Occupational History  . Not on file  Social Needs  . Financial resource strain: Not on file  . Food insecurity:    Worry: Not on file    Inability: Not on file  . Transportation needs:    Medical: Not on file    Non-medical: Not on file  Tobacco Use  . Smoking status: Current Every Day Smoker    Packs/day: 0.50    Types: Cigarettes  . Smokeless tobacco: Never Used  Substance and Sexual Activity  . Alcohol use: Yes    Comment: ocassionally  . Drug use: Yes    Frequency: 3.0 times per week    Types: Marijuana    Comment: occasionally  . Sexual activity: Yes  Lifestyle  . Physical activity:    Days per week: Not on file    Minutes per session: Not on file  . Stress: Not on file  Relationships  . Social connections:    Talks on phone: Not on file    Gets together: Not on file    Attends religious service: Not on file    Active member of club or organization: Not on file    Attends meetings of clubs or organizations: Not on file    Relationship status: Not on file  Other Topics Concern  . Not on file  Social History Narrative  . Not on file     Review of  Systems: A 12 point ROS discussed and pertinent positives are indicated in the HPI above.  All other systems are negative.  Review of Systems  Constitutional: Negative for fatigue and fever.  Respiratory: Negative for cough and shortness of breath.   Cardiovascular: Negative for chest pain.  Gastrointestinal: Positive for abdominal pain, constipation and rectal pain.  Musculoskeletal: Negative for back pain.  Psychiatric/Behavioral: Negative for behavioral problems and confusion.    Vital Signs: BP 119/88 (BP Location: Right Arm)   Pulse 74   Temp 98 F (36.7 C) (Oral)   Resp 16   Ht 5\' 7"  (1.702 m)   Wt 170 lb (77.1 kg)   SpO2 97%   BMI 26.63 kg/m   Physical Exam  Constitutional: She is oriented to person, place, and time. She appears  well-developed.  Cardiovascular: Normal rate, regular rhythm and normal heart sounds. Exam reveals no gallop and no friction rub.  No murmur heard. Pulmonary/Chest: Effort normal and breath sounds normal. No respiratory distress.  Abdominal: Normal appearance.  Neurological: She is alert and oriented to person, place, and time.  Skin: Skin is warm and dry.  Psychiatric: She has a normal mood and affect. Her behavior is normal.  Nursing note and vitals reviewed.    MD Evaluation Airway: WNL Heart: WNL Abdomen: WNL Chest/ Lungs: WNL ASA  Classification: 2 Mallampati/Airway Score: One   Imaging: Ct Abdomen Pelvis W Contrast  Result Date: 04/06/2018 CLINICAL DATA:  Pelvic pain, rectal abscess recently. EXAM: CT ABDOMEN AND PELVIS WITH CONTRAST TECHNIQUE: Multidetector CT imaging of the abdomen and pelvis was performed using the standard protocol following bolus administration of intravenous contrast. CONTRAST:  ISOVUE-300 IOPAMIDOL (ISOVUE-300) INJECTION 61% COMPARISON:  02/19/2018 FINDINGS: Lower chest: Normal size heart. Minimal dependent bibasilar atelectasis. No active pulmonary disease. Hepatobiliary: No focal liver abnormality is  seen. No gallstones, gallbladder wall thickening, or biliary dilatation. Pancreas: Unremarkable. No pancreatic ductal dilatation or surrounding inflammatory changes. Spleen: Normal in size without focal abnormality. Adrenals/Urinary Tract: Normal bilateral adrenal glands and kidneys. No hydroureteronephrosis. Physiologic distention of the urinary bladder. Stomach/Bowel: Complex enhancing U-shaped fluid collection surrounding the rectum consistent with recurrence of perirectal abscess measuring up to 2.5 cm in thickness, series 2/76. Decompressed stomach with normal small bowel rotation. Fluid-filled small bowel loops leading up to the luminal narrowing of the rectum from positive mass effect from the perirectal abscess. Liquid stool is noted within with consistent diarrheal disease. Redemonstration of presacral soft tissue nodules possibly representing mildly enlarged reactive lymph nodes. These measure up to 12 mm. Vascular/Lymphatic: No significant vascular findings are present. Reproductive: Uterus and adnexa are unremarkable. Other: New supraumbilical ventral soft tissue induration likely representing postop scarring. No seroma or fluid collections at site of induration. No free air. Musculoskeletal: No acute or significant osseous findings. IMPRESSION: 1. Recurrence of perirectal enhancing U shaped fluid collection surrounding the rectum consistent with a perirectal abscess measuring up to 2.5 cm in thickness posteriorly. 2. Stable nonspecific presacral soft tissue nodules that may reflect reactive lymph nodes. 3. Liquid stool within the moderately distended colon leading up to the rectum consistent with diarrheal disease. Electronically Signed   By: Tollie Eth M.D.   On: 04/06/2018 18:17    Labs:  CBC: Recent Labs    02/27/18 0421 02/28/18 0438 04/06/18 1701 04/07/18 0506  WBC 14.5* 10.5 21.4* 20.3*  HGB 8.8* 7.8* 11.1* 10.6*  HCT 26.9* 24.2* 34.5* 34.1*  PLT 306 317 261 276     COAGS: Recent Labs    07/26/17 0651 04/07/18 0506  INR 0.85 1.09    BMP: Recent Labs    02/25/18 0413 02/26/18 0438 02/27/18 0421 02/28/18 0438 04/06/18 1701 04/07/18 0506  NA 141  --  141  --  137 137  K 2.6* 4.1 4.7 4.1 3.9 3.0*  CL 100*  --  109  --  96* 94*  CO2 28  --  28  --  28 31  GLUCOSE 82  --  109*  --  86 108*  BUN 7  --  11  --  10 11  CALCIUM 8.6*  --  8.7*  --  8.9 8.5*  CREATININE 0.51 0.48 0.41*  --  0.44 0.54  GFRNONAA >60 >60 >60  --  >60 >60  GFRAA >60 >60 >60  --  >  60 >60    LIVER FUNCTION TESTS: Recent Labs    07/19/17 1804 07/26/17 0651 02/19/18 1149 04/06/18 1701  BILITOT 0.1* 0.6 0.5 1.0  AST 17 19 15 20   ALT 11* 15 12* 13  ALKPHOS 43 44 52 93  PROT 6.3* 7.1 7.6 8.1  ALBUMIN 3.6 3.9 4.2 3.6    TUMOR MARKERS: No results for input(s): AFPTM, CEA, CA199, CHROMGRNA in the last 8760 hours.  Assessment and Plan: Intra-abdominal fluid collection Patient s/p rectal surgery 02/26/18, now with suspected post-op fluid collection vs.abscess.  She has had recurrent rectal/peri-rectal abscesses in the past requiring percutaneous drainage.  IR consulted for aspiration and drainage. Case reviewed and approved by Dr. Deanne CofferHassell.  She has been NPO.  Lovenox was held.  Plan for drain placement today.   Risks and benefits discussed with the patient including bleeding, infection, damage to adjacent structures, bowel perforation/fistula connection, and sepsis.  All of the patient's questions were answered, patient is agreeable to proceed. Consent signed and in chart.  Thank you for this interesting consult.  I greatly enjoyed meeting Stephanie [name] and look forward to participating in their care.  A copy of this report was sent to the requesting provider on this date.  Electronically Signed: Hoyt KochKacie Sue-Ellen Karalee Hauter, PA 04/07/2018, 1:09 PM   I spent a total of 40 Minutes    in face to face in clinical consultation, greater than 50% of which was  counseling/coordinating care for intra-abdominal fluid collection.

## 2018-04-07 NOTE — Progress Notes (Signed)
[facility] Progress Note     Subjective: CC-  No new complaints. Continues to have some pain/pressure in her pelvis as well as leaking stool. Pain currently well controlled with medication. IR consult pending for possible perc drainage of abscess.  Objective: Vital signs in last 24 hours: Temp:  [98 F (36.7 C)-98.2 F (36.8 C)] 98 F (36.7 C) (07/15 0500) Pulse Rate:  [72-111] 73 (07/15 0500) Resp:  [16-18] 16 (07/15 0500) BP: (93-136)/(71-93) 93/71 (07/15 0500) SpO2:  [92 %-100 %] 97 % (07/15 0500) Weight:  [170 lb (77.1 kg)] 170 lb (77.1 kg) (07/14 1521) Last BM Date: 04/07/18  Intake/Output from previous day: 07/14 0701 - 07/15 0700 In: 450 [I.V.:450] Out: -  Intake/Output this shift: No intake/output data recorded.  PE: Gen:  Alert, NAD, pleasant HEENT: EOM's intact, pupils equal and round Card:  RRR, no M/G/R heard Pulm:  CTAB, no W/R/R, effort normal Abd: Soft, ND, NT, +BS, well healed lap incisions. Psych: A&Ox3  Skin: no rashes noted, warm and dry  Lab Results:  Recent Labs    04/06/18 1701 04/07/18 0506  WBC 21.4* 20.3*  HGB 11.1* 10.6*  HCT 34.5* 34.1*  PLT 261 276   BMET Recent Labs    04/06/18 1701 04/07/18 0506  NA 137 137  K 3.9 3.0*  CL 96* 94*  CO2 28 31  GLUCOSE 86 108*  BUN 10 11  CREATININE 0.44 0.54  CALCIUM 8.9 8.5*   PT/INR Recent Labs    04/07/18 0506  LABPROT 14.0  INR 1.09   CMP     Component Value Date/Time   NA 137 04/07/2018 0506   K 3.0 (L) 04/07/2018 0506   CL 94 (L) 04/07/2018 0506   CO2 31 04/07/2018 0506   GLUCOSE 108 (H) 04/07/2018 0506   BUN 11 04/07/2018 0506   CREATININE 0.54 04/07/2018 0506   CALCIUM 8.5 (L) 04/07/2018 0506   PROT 8.1 04/06/2018 1701   ALBUMIN 3.6 04/06/2018 1701   AST 20 04/06/2018 1701   ALT 13 04/06/2018 1701   ALKPHOS 93 04/06/2018 1701   BILITOT 1.0 04/06/2018 1701   GFRNONAA >60 04/07/2018 0506   GFRAA >60 04/07/2018 0506   Lipase     Component Value  Date/Time   LIPASE 81 (H) 02/19/2018 1149       Studies/Results: Ct Abdomen Pelvis W Contrast  Result Date: 04/06/2018 CLINICAL DATA:  Pelvic pain, rectal abscess recently. EXAM: CT ABDOMEN AND PELVIS WITH CONTRAST TECHNIQUE: Multidetector CT imaging of the abdomen and pelvis was performed using the standard protocol following bolus administration of intravenous contrast. CONTRAST:  100mL ISOVUE-300 IOPAMIDOL (ISOVUE-300) INJECTION 61% COMPARISON:  02/19/2018 FINDINGS: Lower chest: Normal size heart. Minimal dependent bibasilar atelectasis. No active pulmonary disease. Hepatobiliary: No focal liver abnormality is seen. No gallstones, gallbladder wall thickening, or biliary dilatation. Pancreas: Unremarkable. No pancreatic ductal dilatation or surrounding inflammatory changes. Spleen: Normal in size without focal abnormality. Adrenals/Urinary Tract: Normal bilateral adrenal glands and kidneys. No hydroureteronephrosis. Physiologic distention of the urinary bladder. Stomach/Bowel: Complex enhancing U-shaped fluid collection surrounding the rectum consistent with recurrence of perirectal abscess measuring up to 2.5 cm in thickness, series 2/76. Decompressed stomach with normal small bowel rotation. Fluid-filled small bowel loops leading up to the luminal narrowing of the rectum from positive mass effect from the perirectal abscess. Liquid stool is noted within with consistent diarrheal disease. Redemonstration of presacral soft tissue nodules possibly representing mildly enlarged reactive lymph nodes. These measure up to 12 mm.  Vascular/Lymphatic: No significant vascular findings are present. Reproductive: Uterus and adnexa are unremarkable. Other: New supraumbilical ventral soft tissue induration likely representing postop scarring. No seroma or fluid collections at site of induration. No free air. Musculoskeletal: No acute or significant osseous findings. IMPRESSION: 1. Recurrence of perirectal enhancing U  shaped fluid collection surrounding the rectum consistent with a perirectal abscess measuring up to 2.5 cm in thickness posteriorly. 2. Stable nonspecific presacral soft tissue nodules that may reflect reactive lymph nodes. 3. Liquid stool within the moderately distended colon leading up to the rectum consistent with diarrheal disease. Electronically Signed   By: Tollie Eth M.D.   On: 04/06/2018 18:17    Anti-infectives: Anti-infectives (From admission, onward)   Start     Dose/Rate Route Frequency Ordered Stop   04/08/18 1000  fluconazole (DIFLUCAN) IVPB 400 mg     400 mg 100 mL/hr over 120 Minutes Intravenous Every 24 hours 04/07/18 0803     04/07/18 1000  fluconazole (DIFLUCAN) IVPB 800 mg     800 mg 100 mL/hr over 240 Minutes Intravenous  Once 04/07/18 0803     04/07/18 0600  piperacillin-tazobactam (ZOSYN) IVPB 3.375 g     3.375 g 12.5 mL/hr over 240 Minutes Intravenous Every 8 hours 04/06/18 2325     04/06/18 2330  piperacillin-tazobactam (ZOSYN) IVPB 3.375 g     3.375 g 100 mL/hr over 30 Minutes Intravenous NOW 04/06/18 2322 04/07/18 0011       Assessment/Plan HTN - home meds, PRN hydralazine Anxiety/depression - celexa  Postoperative abscess S/p resection retrorectal cyst 6/5- Dr Michaell Cowing - CT scan 7/14 showed Recurrence of perirectal enhancing U shaped fluid collection surrounding the rectum consistent with a perirectal abscess measuring up to 2.5 cm in thickness posteriorly - WBC 20.3, afebrile  ID - zosyn 7/15>>, diflucan 7/16>> FEN - IVF, NPO VTE - SCDs, lovenox Foley - none  Plan - IR consult for consideration of drain. Keep NPO for now. Continue IV zosyn and add diflucan to broaden coverage.   LOS: 1 day    Franne Forts , [facility] 04/07/2018, 9:07 AM Pager: [phone] Consults: [phone] Mon 7:00 am -11:30 AM Tues-Fri 7:00 am-4:30 pm Sat-Sun 7:00 am-11:30 am

## 2018-04-07 NOTE — Progress Notes (Signed)
Pharmacy Antibiotic Note  Stephanie [name] is a 37 y.o. female admitted on 04/06/2018 with intra-abdominal infection.  Pharmacy was consulted for zosyn dosing on 7/14  Today, 04/07/2018:  WBC marginally better  Remains afebrile  SCr stable (low)  Surgery adding fluconazole per Pharmacy  Plan:  Continue Zosyn 3.375 g IV every 8 hrs by 4-hr infusion  Fluconazole 800 mg IV x 1 today, then 400 mg IV daily starting tomorrow   Height: 5\' 7"  (170.2 cm) Weight: 170 lb (77.1 kg) IBW/kg (Calculated) : 61.6  Temp (24hrs), Avg:98.1 F (36.7 C), Min:98 F (36.7 C), Max:98.2 F (36.8 C)  Recent Labs  Lab 04/06/18 1701 04/07/18 0506  WBC 21.4* 20.3*  CREATININE 0.44 0.54    Estimated Creatinine Clearance: 106.1 mL/min (by C-G formula based on SCr of 0.54 mg/dL).    No Known Allergies  Antimicrobials this admission: 7/14 zosyn >>  7/15 fluconazole >>   Dose adjustments this admission: n/a  Microbiology results: BCx:  UCx:   Sputum:   MRSA PCR:   Thank you for allowing pharmacy to be a part of this patient's care.  Bernadene Personrew Retal Tonkinson, PharmD, BCPS [phone] 04/07/2018, 8:00 AM

## 2018-04-08 ENCOUNTER — Other Ambulatory Visit: Payer: Self-pay | Admitting: Surgery

## 2018-04-08 ENCOUNTER — Other Ambulatory Visit (HOSPITAL_COMMUNITY): Payer: Self-pay | Admitting: Student

## 2018-04-08 DIAGNOSIS — K611 Rectal abscess: Secondary | ICD-10-CM

## 2018-04-08 LAB — CBC
HCT: 31.2 % — ABNORMAL LOW (ref 36.0–46.0)
Hemoglobin: 9.5 g/dL — ABNORMAL LOW (ref 12.0–15.0)
MCH: 29.7 pg (ref 26.0–34.0)
MCHC: 30.4 g/dL (ref 30.0–36.0)
MCV: 97.5 fL (ref 78.0–100.0)
PLATELETS: 282 10*3/uL (ref 150–400)
RBC: 3.2 MIL/uL — ABNORMAL LOW (ref 3.87–5.11)
RDW: 15.2 % (ref 11.5–15.5)
WBC: 9.9 10*3/uL (ref 4.0–10.5)

## 2018-04-08 LAB — BASIC METABOLIC PANEL
Anion gap: 10 (ref 5–15)
BUN: 12 mg/dL (ref 6–20)
CALCIUM: 8.2 mg/dL — AB (ref 8.9–10.3)
CHLORIDE: 96 mmol/L — AB (ref 98–111)
CO2: 30 mmol/L (ref 22–32)
CREATININE: 0.51 mg/dL (ref 0.44–1.00)
GFR calc Af Amer: >60 mL/min (ref >60)
GFR calc non Af Amer: >60 mL/min (ref >60)
Glucose, Bld: 90 mg/dL (ref 70–99)
Potassium: 3.3 mmol/L — ABNORMAL LOW (ref 3.5–5.1)
SODIUM: 136 mmol/L (ref 135–145)

## 2018-04-08 MED ORDER — AMOXICILLIN-POT CLAVULANATE 875-125 MG PO TABS: 1.0000 | ORAL_TABLET | Freq: Two times a day (BID) | ORAL | 0 refills | Status: AC

## 2018-04-08 MED ORDER — OXYCODONE HCL 5 MG PO TABS: 5.0000 mg | ORAL_TABLET | Freq: Four times a day (QID) | ORAL | 0 refills | Status: DC | PRN

## 2018-04-08 MED ORDER — SODIUM CHLORIDE 0.9% FLUSH: 5.0000 mL | Freq: Three times a day (TID) | INTRAVENOUS | 0 refills | Status: AC

## 2018-04-08 MED ORDER — HYDROMORPHONE HCL 1 MG/ML IJ SOLN: 0.5000 mg | Freq: Four times a day (QID) | INTRAMUSCULAR | Status: DC | PRN

## 2018-04-08 MED ORDER — GABAPENTIN 300 MG PO CAPS: 300.0000 mg | ORAL_CAPSULE | Freq: Two times a day (BID) | ORAL | 1 refills | Status: DC

## 2018-04-08 MED ORDER — FLUCONAZOLE 100 MG PO TABS: 100.0000 mg | ORAL_TABLET | Freq: Every day | ORAL | 0 refills | Status: AC

## 2018-04-08 MED ORDER — OXYCODONE HCL 5 MG PO TABS: 5.0000 mg | ORAL_TABLET | ORAL | Status: DC | PRN

## 2018-04-08 MED FILL — oxyCODONE HCL 5 MG TABS: 5 | 8 days supply | Qty: 30 | Fill #0

## 2018-04-08 MED FILL — AMOX-CLAV 875-125 MG TABLET: 875-125 | 14 days supply | Qty: 28 | Fill #0

## 2018-04-08 MED FILL — NORMAL SALINE FLUSH SYRINGE: 0.9 | 15 days supply | Qty: 450 | Fill #0

## 2018-04-08 MED FILL — GABAPENTIN 300 MG CAPSULE: 300 | 20 days supply | Qty: 40 | Fill #0

## 2018-04-08 MED FILL — FLUCONAZOLE 100 MG TABLET: 100 | 14 days supply | Qty: 14 | Fill #0

## 2018-04-08 NOTE — Discharge Instructions (Signed)
Flush drain with 5cc saline three times daily Measure drain output daily Follow up as scheduled with interventional radiology and Dr. Michaell CowingGross Call with concerns

## 2018-04-08 NOTE — Discharge Summary (Signed)
[facility] Discharge Summary   Patient ID: Stephanie [name] MRN: [id number] DOB/AGE: September 23, 1981 37 y.o.  Admit date: 04/06/2018 Discharge date: 04/08/2018  Admitting Diagnosis: Postoperative abscess S/p resection retrorectal cyst 6/5- Dr Michaell Cowing   Discharge Diagnosis Patient Active Problem List   Diagnosis Date Noted  . Noncompliance with treatment due to social/financial constraints 04/07/2018  . 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 External hemorrhoids status post hemorrhoidectomy x2, 02/26/2018 02/26/2018  . Hypokalemia 02/25/2018  . Hypomagnesemia 02/25/2018  . Pelvic pain 02/21/2018  . Schizophrenia (HCC)   . Panic attacks   . Hypertension   . PTSD (post-traumatic stress disorder)   . Constipation, chronic 02/20/2018  . Sweaty palms 10/25/2017  . Encounter to establish care 10/25/2017  . Depression 10/25/2017  . 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 Prolapsed internal hemorrhoids, grade 2&3, s/p hemorrhoidal ligation/pexy 02/26/2018 05/01/2013    Consultants Interventional radiology  Imaging: Ct Abdomen Pelvis W Contrast  Result Date: 04/06/2018 CLINICAL DATA:  Pelvic pain, rectal abscess recently. EXAM: CT ABDOMEN AND PELVIS WITH CONTRAST TECHNIQUE: Multidetector CT imaging of the abdomen and pelvis was performed using the standard protocol following bolus administration of intravenous contrast. CONTRAST:  ISOVUE-300 IOPAMIDOL (ISOVUE-300) INJECTION 61% COMPARISON:  02/19/2018 FINDINGS: Lower chest: Normal size heart. Minimal dependent bibasilar atelectasis. No active pulmonary disease. Hepatobiliary: No focal liver abnormality is seen. No gallstones, gallbladder wall thickening, or biliary dilatation. Pancreas: Unremarkable. No pancreatic ductal dilatation or surrounding inflammatory changes. Spleen: Normal in size without focal abnormality. Adrenals/Urinary Tract: Normal bilateral  adrenal glands and kidneys. No hydroureteronephrosis. Physiologic distention of the urinary bladder. Stomach/Bowel: Complex enhancing U-shaped fluid collection surrounding the rectum consistent with recurrence of perirectal abscess measuring up to 2.5 cm in thickness, series 2/76. Decompressed stomach with normal small bowel rotation. Fluid-filled small bowel loops leading up to the luminal narrowing of the rectum from positive mass effect from the perirectal abscess. Liquid stool is noted within with consistent diarrheal disease. Redemonstration of presacral soft tissue nodules possibly representing mildly enlarged reactive lymph nodes. These measure up to 12 mm. Vascular/Lymphatic: No significant vascular findings are present. Reproductive: Uterus and adnexa are unremarkable. Other: New supraumbilical ventral soft tissue induration likely representing postop scarring. No seroma or fluid collections at site of induration. No free air. Musculoskeletal: No acute or significant osseous findings. IMPRESSION: 1. Recurrence of perirectal enhancing U shaped fluid collection surrounding the rectum consistent with a perirectal abscess measuring up to 2.5 cm in thickness posteriorly. 2. Stable nonspecific presacral soft tissue nodules that may reflect reactive lymph nodes. 3. Liquid stool within the moderately distended colon leading up to the rectum consistent with diarrheal disease. Electronically Signed   By: Tollie Eth M.D.   On: 04/06/2018 18:17   Ct Image Guided Drainage By Percutaneous Catheter  Result Date: 04/07/2018 CLINICAL DATA:  Recurrent complex perirectal fluid collection suggesting abscess EXAM: CT GUIDED DRAINAGE OF PERIRECTAL ABSCESS ANESTHESIA/SEDATION: Intravenous Fentanyl and Versed were administered as conscious sedation during continuous monitoring of the patient's level of consciousness and physiological / cardiorespiratory status by the radiology RN, with a total moderate sedation time of 16  minutes. PROCEDURE: The procedure, risks, benefits, and alternatives were explained to the patient. Questions regarding the procedure were encouraged and answered. The patient understands and consents to the procedure. Patient placed prone. Select axial scans through the pelvis obtained. The perirectal collection was localized and an appropriate skin entry site was determined and marked The  operative field was prepped with chlorhexidinein a sterile fashion, and a sterile drape was applied covering the operative field. A sterile gown and sterile gloves were used for the procedure. Local anesthesia was provided with 1% Lidocaine. Under CT fluoroscopic guidance, an 18 gauge trocar needle was advanced into the collection using a left trans gluteal approach. Purulent material could be aspirated. An Amplatz wire advanced easily within the collection [place], its position confirmed on CT. Tract dilated to facilitate placement of a 12 French pigtail drain catheter, formed within the dependent aspect of the collection. Approximately 5 mL of purulent fluid were aspirated, sent for Gram stain and culture. Catheter secured externally so Prolene suture and StatLock and placed to gravity drain bag. The patient tolerated the procedure well. COMPLICATIONS: None immediate FINDINGS: The complex perirectal collection was again localized. Purulent material was aspirated, sample sent for Gram stain and culture. A 12 French pigtail drain catheter was placed as above. IMPRESSION: 1. Technically successful CT-guided perirectal abscess drain catheter placement. Electronically Signed   By: Corlis Leak M.D.   On: 04/07/2018 15:05    Procedures Dr. Deanne Coffer (04/07/18) - CT perc drain placement perirectal abscess   Hospital Course:  Stephanie [name] is a 37yo female s/p resection retrorectal cystic mass and hemorrhoidectomy by Dr Michaell Cowing on 6/5 for recurrent pelvic infections, who presented to [facility]14 with 3 days of worsening pelvic pressure  and leaking stool.  Workup showed leukocytosis and CT scan with postoperative abscess.  Patient was admitted for IV antibiotics. Interventional radiology was consulted and placed a drain on 7/14.  Tolerated procedure well and was transferred to the floor.  Diet was advanced as tolerated. Leukocytosis resolved and pain improved.  On 7/16, the patient was voiding well, tolerating diet, ambulating well, pain well controlled, vital signs stable, and felt stable for discharge home.  She will go home with 2 weeks of augmentin and diflucan. Patient will follow up as below with IR and Dr. Michaell Cowing, and knows to call with questions or concerns.    I have personally reviewed the patients medication history on the [place] controlled substance database.   Physical Exam: Gen:  Alert, NAD, pleasant HEENT: EOM's intact, pupils equal and round Card:  RRR, no M/G/R heard Pulm:  CTAB, no W/R/R, effort normal Abd: Soft, ND, NT, +BS, well healed lap incisions. GU: perc drain with purulent fluid in bag Psych: A&Ox3   Allergies as of 04/08/2018   No Known Allergies     Medication List    STOP taking these medications   meloxicam 15 MG tablet Commonly known as:  MOBIC   senna-docusate 8.6-50 MG tablet Commonly known as:  Senokot-S     TAKE these medications   acetaminophen 500 MG tablet Commonly known as:  TYLENOL Take 2 tablets (1,000 mg total) by mouth every 8 (eight) hours.   amLODipine 5 MG tablet Commonly known as:  NORVASC TK 1 T PO EACH DAY   amoxicillin-clavulanate 875-125 MG tablet Commonly known as:  AUGMENTIN Take 1 tablet by mouth 2 (two) times daily for 14 days.   BC HEADACHE POWDER PO Take 1 packet daily as needed by mouth (headaches).   celecoxib 200 MG capsule Commonly known as:  CELEBREX Take 200 mg by mouth daily.   citalopram 10 MG tablet Commonly known as:  CELEXA Take 1 tablet (10 mg total) by mouth daily.   fluconazole 100 MG tablet Commonly known as:  DIFLUCAN Take 1  tablet (100 mg total) by mouth  daily for 14 days.   gabapentin 300 MG capsule Commonly known as:  NEURONTIN Take 1 capsule (300 mg total) by mouth 2 (two) times daily.   hydrOXYzine 10 MG tablet Commonly known as:  ATARAX/VISTARIL Take 1 tablet (10 mg total) by mouth 2 (two) times daily.   multivitamins with iron Tabs tablet Take 1 tablet by mouth daily.   oxyCODONE 5 MG immediate release tablet Commonly known as:  Oxy IR/ROXICODONE Take 1 tablet (5 mg total) by mouth every 6 (six) hours as needed for severe pain.   psyllium 95 % Pack Commonly known as:  HYDROCIL/METAMUCIL Take 1 packet by mouth 2 (two) times daily.   sodium chloride flush 0.9 % Soln Commonly known as:  NS 5 mLs by Intracatheter route every 8 (eight) hours.   SUMAtriptan 25 MG tablet Commonly known as:  IMITREX Take 25 mg every 2 (two) hours as needed by mouth for migraine.        Follow-up Information    Karie SodaGross, Steven, MD. Call.   Specialty:  General Surgery Why:  We are working on your appointment, please call to confirm Contact information: [address] [place] [address] [phone]           Signed: Franne FortsBrooke A Merik Mignano, [facility] 04/08/2018, 9:05 AM Pager: [phone] Consults: [phone] Mon 7:00 am -11:30 AM Tues-Fri 7:00 am-4:30 pm Sat-Sun 7:00 am-11:30 am

## 2018-04-12 LAB — AEROBIC/ANAEROBIC CULTURE (SURGICAL/DEEP WOUND)

## 2018-04-12 LAB — AEROBIC/ANAEROBIC CULTURE W GRAM STAIN (SURGICAL/DEEP WOUND)

## 2018-04-15 ENCOUNTER — Ambulatory Visit
Admission: RE | Admit: 2018-04-15 | Discharge: 2018-04-15 | Disposition: A | Discharge status: Home / Self Care | Payer: Medicaid Other | Source: Ambulatory Visit | Attending: Surgery | Admitting: Surgery

## 2018-04-15 ENCOUNTER — Other Ambulatory Visit: Payer: Self-pay | Admitting: Surgery

## 2018-04-15 ENCOUNTER — Ambulatory Visit
Admission: RE | Admit: 2018-04-15 | Discharge: 2018-04-15 | Disposition: A | Discharge status: Home / Self Care | Payer: Medicaid Other | Source: Ambulatory Visit | Attending: Student | Admitting: Student

## 2018-04-15 ENCOUNTER — Encounter: Payer: Self-pay | Admitting: Radiology

## 2018-04-15 DIAGNOSIS — K611 Rectal abscess: Secondary | ICD-10-CM

## 2018-04-15 HISTORY — PX: IR RADIOLOGIST EVAL & MGMT: IMG5224

## 2018-04-15 MED ORDER — IOPAMIDOL (ISOVUE-300) INJECTION 61%
100.0000 mL | Freq: Once | INTRAVENOUS | Status: AC | PRN
  Administered 2018-04-15: 100 mL via INTRAVENOUS

## 2018-04-15 NOTE — Progress Notes (Signed)
Referring Physician(s): Dr. Dwain Sarna  Chief Complaint: The patient is seen in follow up today s/p perirectal abscess s/p drain placement 04/07/18  History of present illness: Stephanie [name] is a 37 year old female with past medical history of recurrent abscess, retrorectal cyst.  She underwent retrorectal cystectomy 02/26/18 with Dr. Michaell Cowing and did well post-op until the week of 7/14 when she presented to [facility] with abdominal pain and constipation.    Abdomen/Pelvis CT scan on 04/07/18 showed: 1. Recurrence of perirectal enhancing U shaped fluid collection surrounding the rectum consistent with a perirectal abscess measuring up to 2.5 cm in thickness posteriorly. 2. Stable nonspecific presacral soft tissue nodules that may reflect reactive lymph nodes. 3. Liquid stool within the moderately distended colon leading up to the rectum consistent with diarrheal disease.  Patient underwent CT-guided drain placement by Dr. Deanne Coffer 04/07/18.  She returns to clinic today for follow-up of her drain.  She states she has been flushing her drain with 2-5 mL saline 2 times daily.  She has logged <10 mL of output daily.  She denies fever, chills but continues with abdominal pain which she states may be the drain or may be post-op as it is difficult to differentiate at times.   She is still on PO antibiotics.   Past Medical History:  Diagnosis Date  . Anemia   . Depression   . Gestational diabetes mellitus    gestational only  . H/O cesarean section 2010  . Headache    migraines  . Hemorrhoids   . Hypertension   . Panic attacks   . PTSD (post-traumatic stress disorder)   . Schizophrenia (HCC)   . Vaginal delivery 2004 , 2008, 2009    Past Surgical History:  Procedure Laterality Date  . CESAREAN SECTION    . COLONOSCOPY WITH PROPOFOL  08/19/2017   Procedure: COLONOSCOPY WITH PROPOFOL;  Surgeon: Andria Meuse, MD;  Location: [facility];  Service: General;;  . COLONOSCOPY WITH PROPOFOL  N/A 02/21/2018   Procedure: COLONOSCOPY WITH PROPOFOL;  Surgeon: Hilarie Fredrickson, MD;  Location: [facility];  Service: Endoscopy;  Laterality: N/A;  . EVALUATION UNDER ANESTHESIA WITH HEMORRHOIDECTOMY N/A 02/26/2018   Procedure: EXAM UNDER ANESTHESIA WITH HEMORRHOIDAL LIGATION AND PEXY, EXTERNAL HEMORRHOIDECTOMY X2;  Surgeon: Karie Soda, MD;  Location: [facility];  Service: General;  Laterality: N/A;  . FLEXIBLE SIGMOIDOSCOPY  09/25/2017   Procedure: FLEXIBLE SIGMOIDOSCOPY;  Surgeon: Andria Meuse, MD;  Location: [facility];  Service: General;;  . i and d perirectal abcess  2016  . INCISION AND DRAINAGE PERIRECTAL ABSCESS N/A 05/28/2015   Procedure: IRRIGATION AND DEBRIDEMENT PERIRECTAL ABSCESS;  Surgeon: Darnell Level, MD;  Location: [facility];  Service: General;  Laterality: N/A;  . IR RADIOLOGIST EVAL & MGMT  04/02/2017  . TUBAL LIGATION  2010  . XI ROBOTIC ASSISTED LOWER ANTERIOR RESECTION N/A 02/26/2018   Procedure: XI ROBOTIC ASSISTED LYSIS OF ADHESIONS, RETRORECTAL RESECTION OF PRESACRAL CYSTIC MASS, FIREFLY INJECTION;  Surgeon: Karie Soda, MD;  Location: [facility];  Service: General;  Laterality: N/A;    Allergies: Patient has no known allergies.  Medications: Prior to Admission medications   Medication Sig Start Date End Date Taking? Authorizing Provider  acetaminophen (TYLENOL) 500 MG tablet Take 2 tablets (1,000 mg total) by mouth every 8 (eight) hours. 02/27/18   Adam Phenix, PA-C  amLODipine (NORVASC) 5 MG tablet TK 1 T PO EACH DAY 02/03/18   [provider]  amoxicillin-clavulanate (AUGMENTIN) 875-125 MG tablet Take  1 tablet by mouth 2 (two) times daily for 14 days. 04/08/18 04/22/18  Meuth, Lina Sar, PA-C  Aspirin-Salicylamide-Caffeine (BC HEADACHE POWDER PO) Take 1 packet daily as needed by mouth (headaches).    [provider]  celecoxib (CELEBREX) 200 MG capsule Take 200 mg by mouth daily. 01/18/18   [provider]  citalopram (CELEXA) 10 MG tablet  Take 1 tablet (10 mg total) by mouth daily. 02/27/18   Karie Soda, MD  fluconazole (DIFLUCAN) 100 MG tablet Take 1 tablet (100 mg total) by mouth daily for 14 days. 04/08/18 04/22/18  Meuth, Brooke A, PA-C  gabapentin (NEURONTIN) 300 MG capsule Take 1 capsule (300 mg total) by mouth 2 (two) times daily. 04/08/18   Meuth, Brooke A, PA-C  hydrOXYzine (ATARAX/VISTARIL) 10 MG tablet Take 1 tablet (10 mg total) by mouth 2 (two) times daily. 02/26/18   Karie Soda, MD  Multiple Vitamins-Iron (MULTIVITAMINS WITH IRON) TABS tablet Take 1 tablet by mouth daily. 02/28/18   Meuth, Brooke A, PA-C  oxyCODONE (OXY IR/ROXICODONE) 5 MG immediate release tablet Take 1 tablet (5 mg total) by mouth every 6 (six) hours as needed for severe pain. 04/08/18   Meuth, Brooke A, PA-C  psyllium (HYDROCIL/METAMUCIL) 95 % PACK Take 1 packet by mouth 2 (two) times daily. 02/27/18   Adam Phenix, PA-C  sodium chloride flush (NS) 0.9 % SOLN 5 mLs by Intracatheter route every 8 (eight) hours. 04/08/18 05/08/18  Meuth, Lina Sar, PA-C  SUMAtriptan (IMITREX) 25 MG tablet Take 25 mg every 2 (two) hours as needed by mouth for migraine. 07/15/17   [provider]     Family History  Problem Relation Age of Onset  . Hypertension Mother   . Hypertension Other     Social History   Socioeconomic History  . Marital status: Single    Spouse name: Not on file  . Number of children: Not on file  . Years of education: Not on file  . Highest education level: Not on file  Occupational History  . Not on file  Social Needs  . Financial resource strain: Not on file  . Food insecurity:    Worry: Not on file    Inability: Not on file  . Transportation needs:    Medical: Not on file    Non-medical: Not on file  Tobacco Use  . Smoking status: Current Every Day Smoker    Packs/day: 0.50    Types: Cigarettes  . Smokeless tobacco: Never Used  Substance and Sexual Activity  . Alcohol use: Yes    Comment: ocassionally  . Drug  use: Yes    Frequency: 3.0 times per week    Types: Marijuana    Comment: occasionally  . Sexual activity: Yes  Lifestyle  . Physical activity:    Days per week: Not on file    Minutes per session: Not on file  . Stress: Not on file  Relationships  . Social connections:    Talks on phone: Not on file    Gets together: Not on file    Attends religious service: Not on file    Active member of club or organization: Not on file    Attends meetings of clubs or organizations: Not on file    Relationship status: Not on file  Other Topics Concern  . Not on file  Social History Narrative  . Not on file     Vital Signs: There were no vitals taken for this visit.  Physical  Exam  Constitutional: She is oriented to person, place, and time. She appears well-developed. No distress.  Abdominal: Soft. She exhibits no distension. There is no tenderness.  Transgluteal drain in place.  Insertion site c/d/i. Small amount of blood-tinged output in collection bag.  Reports only a small amount of output daily.   Neurological: She is alert and oriented to person, place, and time.  Skin: Skin is warm and dry. She is not diaphoretic.  Nursing note and vitals reviewed.   Imaging: No results found.  Labs:  CBC: Recent Labs    02/28/18 0438 04/06/18 1701 04/07/18 0506 04/08/18 0522  WBC 10.5 21.4* 20.3* 9.9  HGB 7.8* 11.1* 10.6* 9.5*  HCT 24.2* 34.5* 34.1* 31.2*  PLT 317 261 276 282    COAGS: Recent Labs    07/26/17 0651 04/07/18 0506  INR 0.85 1.09    BMP: Recent Labs    02/27/18 0421 02/28/18 0438 04/06/18 1701 04/07/18 0506 04/08/18 0522  NA 141  --  137 137 136  K 4.7 4.1 3.9 3.0* 3.3*  CL 109  --  96* 94* 96*  CO2 28  --  28 31 30   GLUCOSE 109*  --  86 108* 90  BUN 11  --  10 11 12   CALCIUM 8.7*  --  8.9 8.5* 8.2*  CREATININE 0.41*  --  0.44 0.54 0.51  GFRNONAA >60  --  >60 >60 >60  GFRAA >60  --  >60 >60 >60    LIVER FUNCTION TESTS: Recent Labs     07/19/17 1804 07/26/17 0651 02/19/18 1149 04/06/18 1701  BILITOT 0.1* 0.6 0.5 1.0  AST 17 19 15 20   ALT 11* 15 12* 13  ALKPHOS 43 44 52 93  PROT 6.3* 7.1 7.6 8.1  ALBUMIN 3.6 3.9 4.2 3.6    Assessment: Recurrent per-rectal abscess, s/p recent retrorectal cystectomy Drain was placed in the peri-rectal abscess 04/07/18.  Case and imaging reviewed with Dr. Deanne CofferHassell.  Drain injection shows abnormality with no definite fistula, however difficult to rule out completely.  Also with streaking of contrast which may be an extension of the abscess.  Dr. Deanne CofferHassell recommends to leave the drain in place for now. Patient is to keep her follow-up with Dr. Michaell CowingGross 7/29.   We will plan to see her in clinic for repeat scan and injection in 2 weeks.    Signed: Hoyt KochKacie Sue-Ellen Kerilyn Cortner, PA 04/15/2018, 1:27 PM   Please refer to Dr. Deanne CofferHassell attestation of this note for management and plan.

## 2018-04-17 ENCOUNTER — Other Ambulatory Visit: Payer: Self-pay | Admitting: Surgery

## 2018-04-17 DIAGNOSIS — K611 Rectal abscess: Secondary | ICD-10-CM

## 2018-04-29 ENCOUNTER — Ambulatory Visit
Admission: RE | Admit: 2018-04-29 | Discharge: 2018-04-29 | Disposition: A | Discharge status: Home / Self Care | Payer: Medicaid Other | Source: Ambulatory Visit | Attending: Surgery | Admitting: Surgery

## 2018-04-29 ENCOUNTER — Encounter: Payer: Self-pay | Admitting: Radiology

## 2018-04-29 DIAGNOSIS — K611 Rectal abscess: Secondary | ICD-10-CM

## 2018-04-29 HISTORY — PX: IR RADIOLOGIST EVAL & MGMT: IMG5224

## 2018-04-29 MED ORDER — IOPAMIDOL (ISOVUE-300) INJECTION 61%
100.0000 mL | Freq: Once | INTRAVENOUS | Status: AC | PRN
  Administered 2018-04-29: 100 mL via INTRAVENOUS

## 2018-04-29 NOTE — Progress Notes (Signed)
Chief Complaint: Status post percutaneous transcatheter drainage of perirectal abscess on 04/07/2018.  Referring Physician(s): Gross,Steven  History of Present Illness: Stephanie [name] is a 37 y.o. female status post placement of a percutaneous transgluteal drain to treat a postoperative perirectal collection following surgical retrorectal cystectomy on 02/26/2018.  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  She has been flushing the drain approximately 3 times a day with 5 mL of saline.  She has had essentially no return of additional fluid other than what she has been flushing with. She has completed antibiotic therapy.  She is not having any significant pain.  Her diet has been normal.  Past Medical History:  Diagnosis Date  . Anemia   . Depression   . Gestational diabetes mellitus    gestational only  . H/O cesarean section 2010  . Headache    migraines  . Hemorrhoids   . Hypertension   . Panic attacks   . PTSD (post-traumatic stress disorder)   . Schizophrenia (HCC)   . Vaginal delivery 2004 , 2008, 2009    Past Surgical History:  Procedure Laterality Date  . CESAREAN SECTION    . COLONOSCOPY WITH PROPOFOL  08/19/2017   Procedure: COLONOSCOPY WITH PROPOFOL;  Surgeon: Andria Meuse, MD;  Location: [facility];  Service: General;;  . COLONOSCOPY WITH PROPOFOL N/A 02/21/2018   Procedure: COLONOSCOPY WITH PROPOFOL;  Surgeon: Hilarie Fredrickson, MD;  Location: [facility];  Service: Endoscopy;  Laterality: N/A;  . EVALUATION UNDER ANESTHESIA WITH HEMORRHOIDECTOMY N/A 02/26/2018   Procedure: EXAM UNDER ANESTHESIA WITH HEMORRHOIDAL LIGATION AND PEXY, EXTERNAL HEMORRHOIDECTOMY X2;  Surgeon: Karie Soda, MD;  Location: [facility];  Service: General;  Laterality: N/A;  . FLEXIBLE SIGMOIDOSCOPY  09/25/2017   Procedure: FLEXIBLE SIGMOIDOSCOPY;  Surgeon: Andria Meuse, MD;  Location: [facility];   Service: General;;  . i and d perirectal abcess  2016  . INCISION AND DRAINAGE PERIRECTAL ABSCESS N/A 05/28/2015   Procedure: IRRIGATION AND DEBRIDEMENT PERIRECTAL ABSCESS;  Surgeon: Darnell Level, MD;  Location: [facility];  Service: General;  Laterality: N/A;  . IR RADIOLOGIST EVAL & MGMT  04/02/2017  . IR RADIOLOGIST EVAL & MGMT  04/15/2018  . IR RADIOLOGIST EVAL & MGMT  04/29/2018  . TUBAL LIGATION  2010  . XI ROBOTIC ASSISTED LOWER ANTERIOR RESECTION N/A 02/26/2018   Procedure: XI ROBOTIC ASSISTED LYSIS OF ADHESIONS, RETRORECTAL RESECTION OF PRESACRAL CYSTIC MASS, FIREFLY INJECTION;  Surgeon: Karie Soda, MD;  Location: [facility];  Service: General;  Laterality: N/A;    Allergies: Patient has no known allergies.  Medications: Prior to Admission medications   Medication Sig Start Date End Date Taking? Authorizing Provider  acetaminophen (TYLENOL) 500 MG tablet Take 2 tablets (1,000 mg total) by mouth every 8 (eight) hours. 02/27/18   Adam Phenix, PA-C  amLODipine (NORVASC) 5 MG tablet TK 1 T PO EACH DAY 02/03/18   [provider]  Aspirin-Salicylamide-Caffeine (BC HEADACHE POWDER PO) Take 1 packet daily as needed by mouth (headaches).    [provider]  celecoxib (CELEBREX) 200 MG capsule Take 200 mg by mouth daily. 01/18/18   [provider]  citalopram (CELEXA) 10 MG tablet Take 1 tablet (10 mg total) by mouth daily. 02/27/18   Karie Soda, MD  gabapentin (NEURONTIN) 300 MG capsule Take 1 capsule (300 mg total) by mouth 2 (two) times daily. 04/08/18   Meuth, Lina Sar,  PA-C  hydrOXYzine (ATARAX/VISTARIL) 10 MG tablet Take 1 tablet (10 mg total) by mouth 2 (two) times daily. 02/26/18   Karie Soda, MD  Multiple Vitamins-Iron (MULTIVITAMINS WITH IRON) TABS tablet Take 1 tablet by mouth daily. 02/28/18   Meuth, Brooke A, PA-C  oxyCODONE (OXY IR/ROXICODONE) 5 MG immediate release tablet Take 1 tablet (5 mg total) by mouth every 6 (six) hours as needed for severe pain. 04/08/18    Meuth, Brooke A, PA-C  psyllium (HYDROCIL/METAMUCIL) 95 % PACK Take 1 packet by mouth 2 (two) times daily. 02/27/18   Adam Phenix, PA-C  sodium chloride flush (NS) 0.9 % SOLN 5 mLs by Intracatheter route every 8 (eight) hours. 04/08/18 05/08/18  Meuth, Lina Sar, PA-C  SUMAtriptan (IMITREX) 25 MG tablet Take 25 mg every 2 (two) hours as needed by mouth for migraine. 07/15/17   [provider]     Family History  Problem Relation Age of Onset  . Hypertension Mother   . Hypertension Other     Social History   Socioeconomic History  . Marital status: Single    Spouse name: Not on file  . Number of children: Not on file  . Years of education: Not on file  . Highest education level: Not on file  Occupational History  . Not on file  Social Needs  . Financial resource strain: Not on file  . Food insecurity:    Worry: Not on file    Inability: Not on file  . Transportation needs:    Medical: Not on file    Non-medical: Not on file  Tobacco Use  . Smoking status: Current Every Day Smoker    Packs/day: 0.50    Types: Cigarettes  . Smokeless tobacco: Never Used  Substance and Sexual Activity  . Alcohol use: Yes    Comment: ocassionally  . Drug use: Yes    Frequency: 3.0 times per week    Types: Marijuana    Comment: occasionally  . Sexual activity: Yes  Lifestyle  . Physical activity:    Days per week: Not on file    Minutes per session: Not on file  . Stress: Not on file  Relationships  . Social connections:    Talks on phone: Not on file    Gets together: Not on file    Attends religious service: Not on file    Active member of club or organization: Not on file    Attends meetings of clubs or organizations: Not on file    Relationship status: Not on file  Other Topics Concern  . Not on file  Social History Narrative  . Not on file    Review of Systems: A 12 point ROS discussed and pertinent positives are indicated in the HPI above.  All other systems  are negative.  Review of Systems  Constitutional: Negative.   Respiratory: Negative.   Cardiovascular: Negative.   Gastrointestinal: Negative.   Genitourinary: Negative.   Musculoskeletal: Negative.   Neurological: Negative.     Vital Signs: BP (!) 128/92   Pulse 65   Temp 98.7 F (37.1 C)   SpO2 100%   Physical Exam  Constitutional: She is oriented to person, place, and time. She appears well-developed and well-nourished. No distress.  Abdominal: Soft. She exhibits no distension. There is no tenderness. There is no rebound and no guarding.  Neurological: She is alert and oriented to person, place, and time.  Skin: She is not diaphoretic.  Vitals reviewed.  Imaging: Ct Abdomen Pelvis W Contrast  Result Date: 04/29/2018 CLINICAL DATA:  Status post percutaneous catheter drainage of left perirectal abscess on 04/07/2018. Diminished output via the drainage catheter. The patient has completed antibiotic therapy. EXAM: CT ABDOMEN AND PELVIS WITH CONTRAST TECHNIQUE: Multidetector CT imaging of the abdomen and pelvis was performed using the standard protocol following bolus administration of intravenous contrast. CONTRAST:  100mL ISOVUE-300 IOPAMIDOL (ISOVUE-300) INJECTION 61% COMPARISON:  04/15/2018 FINDINGS: Lower chest: No acute abnormality. Hepatobiliary: No focal liver abnormality is seen. No gallstones, gallbladder wall thickening, or biliary dilatation. Pancreas: Unremarkable. No pancreatic ductal dilatation or surrounding inflammatory changes. Spleen: Normal in size without focal abnormality. Adrenals/Urinary Tract: Adrenal glands are unremarkable. Kidneys are normal, without renal calculi, focal lesion, or hydronephrosis. Bladder is unremarkable. Stomach/Bowel: Bowel shows no evidence of obstruction or inflammation. No lesions or free air. Around the posterior and left-sided indwelling left trans gluteal perirectal drainage catheter, no further fluid collection is identified. The  rectum at this level does not appear thickened or abnormal. Vascular/Lymphatic: No significant vascular findings are present. No enlarged abdominal or pelvic lymph nodes. Reproductive: Uterus and bilateral adnexa are unremarkable. Other: No abdominal wall hernia or abnormality. No abdominopelvic ascites. Musculoskeletal: No acute or significant osseous findings. IMPRESSION: No further perirectal abscess. No new abscess identified. No evidence of rectal or other bowel inflammation or thickening. 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 Electronically Signed   By: Irish LackGlenn  Robertha Staples M.D.   On: 04/29/2018 10:39   Ct Abdomen Pelvis W Contrast  Result Date: 04/15/2018 CLINICAL DATA:  perirectal abscess, status post percutaneous drain catheter placement 04/07/2018. Minimal output. EXAM: CT ABDOMEN AND PELVIS WITH CONTRAST TECHNIQUE: Multidetector CT imaging of the abdomen and pelvis was performed using the standard protocol following bolus administration of intravenous contrast. CONTRAST:  100mL ISOVUE-300 IOPAMIDOL (ISOVUE-300) INJECTION 61% COMPARISON:  04/06/2018 and previous FINDINGS: Lower chest: Linear scarring or atelectasis in the right middle lobe. Otherwise negative. Hepatobiliary: No focal liver abnormality is seen. No gallstones, gallbladder wall thickening, or biliary dilatation. Pancreas: Unremarkable. No pancreatic ductal dilatation or surrounding inflammatory changes. Spleen: Normal in size without focal abnormality. Adrenals/Urinary Tract: No adrenal hemorrhage or renal injury identified. Bladder is unremarkable. Stomach/Bowel: Stomach and small bowel are nondilated. Normal appendix. Moderate colonic fecal material without dilatation, wall thickening, or adjacent inflammatory change. The rectum is nondistended. Left transgluteal pigtail drain catheter extends into the  posterolateral perirectal space. There has been significant resolution of the previously identified perirectal abscess with minimal linear residual gas and fluid. No significant undrained component. No new fluid collection. Stable presacral inflammatory/edematous change. 1.9 cm enhancing nodule in anterior to the coccyx is again noted. Vascular/Lymphatic: No significant vascular findings are present. No enlarged abdominal or pelvic lymph nodes. Reproductive: Uterus and bilateral adnexa are unremarkable. Other: No ascites.  No free air. Musculoskeletal: No acute or significant osseous findings. IMPRESSION: 1. Stable perirectal drain catheter with near complete evacuation of the previously identified perirectal abscess. 2. No new or undrained collections. 3. See subsequent drain catheter injection, dictated separately. Electronically Signed   By: Corlis Leak  Hassell M.D.   On: 04/15/2018 15:49   Ct Abdomen Pelvis W Contrast  Result Date: 04/06/2018 CLINICAL DATA:  Pelvic pain, rectal abscess recently. EXAM: CT ABDOMEN AND PELVIS WITH CONTRAST TECHNIQUE: Multidetector CT imaging of the abdomen and pelvis was performed using the standard protocol following bolus  administration of intravenous contrast. CONTRAST:  ISOVUE-300 IOPAMIDOL (ISOVUE-300) INJECTION 61% COMPARISON:  02/19/2018 FINDINGS: Lower chest: Normal size heart. Minimal dependent bibasilar atelectasis. No active pulmonary disease. Hepatobiliary: No focal liver abnormality is seen. No gallstones, gallbladder wall thickening, or biliary dilatation. Pancreas: Unremarkable. No pancreatic ductal dilatation or surrounding inflammatory changes. Spleen: Normal in size without focal abnormality. Adrenals/Urinary Tract: Normal bilateral adrenal glands and kidneys. No hydroureteronephrosis. Physiologic distention of the urinary bladder. Stomach/Bowel: Complex enhancing U-shaped fluid collection surrounding the rectum consistent with recurrence of perirectal abscess  measuring up to 2.5 cm in thickness, series 2/76. Decompressed stomach with normal small bowel rotation. Fluid-filled small bowel loops leading up to the luminal narrowing of the rectum from positive mass effect from the perirectal abscess. Liquid stool is noted within with consistent diarrheal disease. Redemonstration of presacral soft tissue nodules possibly representing mildly enlarged reactive lymph nodes. These measure up to 12 mm. Vascular/Lymphatic: No significant vascular findings are present. Reproductive: Uterus and adnexa are unremarkable. Other: New supraumbilical ventral soft tissue induration likely representing postop scarring. No seroma or fluid collections at site of induration. No free air. Musculoskeletal: No acute or significant osseous findings. IMPRESSION: 1. Recurrence of perirectal enhancing U shaped fluid collection surrounding the rectum consistent with a perirectal abscess measuring up to 2.5 cm in thickness posteriorly. 2. Stable nonspecific presacral soft tissue nodules that may reflect reactive lymph nodes. 3. Liquid stool within the moderately distended colon leading up to the rectum consistent with diarrheal disease. Electronically Signed   By: Tollie Eth M.D.   On: 04/06/2018 18:17   Dg Sinus/fist Tube Chk-non Gi  Result Date: 04/29/2018 INDICATION: Status post percutaneous catheter drainage of postoperative perirectal abscess on 04/07/2018. 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 There is minimal current drainage from the catheter. The patient has completed antibiotic therapy. CT demonstrates no residual abscess or evidence of rectal thickening. EXAM: ABSCESS DRAIN INJECTION WITH CONTRAST UNDER FLUOROSCOPY CONTRAST:  20 mL Omnipaque 300 FLUOROSCOPY TIME:  2 minutes and 42 seconds.  172.7 mGy. MEDICATIONS: None ANESTHESIA/SEDATION: None COMPLICATIONS: None immediate. PROCEDURE: The pre-existing transgluteal perirectal drainage catheter was  injected with contrast under fluoroscopy initially in a prone position followed by both oblique positions and a lateral projection. Multiple spot images were obtained as well as cine loop sequences. Aspiration of as much injected contrast was then performed through the drain. The drain was then cut and removed. Retained drain loop retention suture was also removed after advancing a 3 [place] dilator over the string and applying manual traction. A gauze dressing was applied over the drain exit site. FINDINGS: With injection of the drainage catheter, there is no significant residual abscess cavity noted with small contrast filled cavity seen along the undersurface of the drain. There is immediate visualization of contrast outlining the rectum proximal to the drain. In different projections, it is evident that this contrast is extraluminal and around the rectum with no contrast seen entering the actual lumen of the rectum. Some of this contrast was able to be aspirated via the drain prior to drain removal. The drain and retention string were removed in their entirety. IMPRESSION: Perirectal drainage catheter injection shows no evidence of fistula to the lumen of the rectum. Contrast outlines the rectum proximal to the drain and is extraluminal. The drainage catheter was removed following injection. Electronically Signed   By: Irish Lack M.D.   On: 04/29/2018 10:48   Dg Sinus/fist Tube Chk-non Gi  Result Date:  04/15/2018 CLINICAL DATA:  Perirectal abscess. Pigtail drain catheter placed 04/07/2018. Minimal output. EXAM: ABSCESS DRAIN CATHETER INJECTION UNDER FLUOROSCOPY TECHNIQUE: The procedure, risks (including but not limited to bleeding, infection, organ damage), benefits, and alternatives were explained to the patient. Questions regarding the procedure were encouraged and answered. The patient understands and consents to the procedure. Survey fluoroscopic inspection reveals stable position of the left trans  gluteal pigtail drain catheter. Injection demonstrates filling of a small residual cavity in the midline adjacent to the pigtail drain. There is a linear contrast collection extending inferiorly just to the left of midline. Some of the injected contrast ultimately leaks back around the drain catheter and exits at the skin entry site. On additional oblique injections, communication to the rectum is demonstrated. IMPRESSION: 1. Significant decrease in size of residual abscess cavity. 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 Drain catheter will remain in place, to gravity drain bag. Electronically Signed   By: Corlis Leak M.D.   On: 04/15/2018 15:53   Ct Image Guided Drainage By Percutaneous Catheter  Result Date: 04/07/2018 CLINICAL DATA:  Recurrent complex perirectal fluid collection suggesting abscess EXAM: CT GUIDED DRAINAGE OF PERIRECTAL ABSCESS ANESTHESIA/SEDATION: Intravenous Fentanyl and Versed were administered as conscious sedation during continuous monitoring of the patient's level of consciousness and physiological / cardiorespiratory status by the radiology RN, with a total moderate sedation time of 16 minutes. PROCEDURE: The procedure, risks, benefits, and alternatives were explained to the patient. Questions regarding the procedure were encouraged and answered. The patient understands and consents to the procedure. Patient placed prone. Select axial scans through the pelvis obtained. The perirectal collection was localized and an appropriate skin entry site was determined and marked The operative field was prepped with chlorhexidinein a sterile fashion, and a sterile drape was applied covering the operative field. A sterile gown and sterile gloves were used for the procedure. Local anesthesia was provided with 1% Lidocaine. Under CT fluoroscopic guidance, an 18 gauge trocar needle was advanced into the collection using a left trans gluteal approach. Purulent material could be aspirated. An Amplatz wire  advanced easily within the collection [place], its position confirmed on CT. Tract dilated to facilitate placement of a 12 French pigtail drain catheter, formed within the dependent aspect of the collection. Approximately 5 mL of purulent fluid were aspirated, sent for Gram stain and culture. Catheter secured externally so Prolene suture and StatLock and placed to gravity drain bag. The patient tolerated the procedure well. COMPLICATIONS: None immediate FINDINGS: The complex perirectal collection was again localized. Purulent material was aspirated, sample sent for Gram stain and culture. A 12 French pigtail drain catheter was placed as above. IMPRESSION: 1. Technically successful CT-guided perirectal abscess drain catheter placement. Electronically Signed   By: Corlis Leak M.D.   On: 04/07/2018 15:05   Ir Radiologist Eval & Mgmt  Result Date: 04/29/2018 Please refer to notes tab for details about interventional procedure. (Op Note)  Ir Radiologist Eval & Mgmt  Result Date: 04/15/2018 Please refer to notes tab for details about interventional procedure. (Op Note)   Labs:  CBC: Recent Labs    02/28/18 0438 04/06/18 1701 04/07/18 0506 04/08/18 0522  WBC 10.5 21.4* 20.3* 9.9  HGB 7.8* 11.1* 10.6* 9.5*  HCT 24.2* 34.5* 34.1* 31.2*  PLT 317 261 276 282    COAGS: Recent Labs    07/26/17 0651 04/07/18 0506  INR 0.85 1.09    BMP: Recent Labs    02/27/18 0421 02/28/18 0438 04/06/18 1701 04/07/18 0981  04/08/18 0522  NA 141  --  137 137 136  K 4.7 4.1 3.9 3.0* 3.3*  CL 109  --  96* 94* 96*  CO2 28  --  28 31 30   GLUCOSE 109*  --  86 108* 90  BUN 11  --  10 11 12   CALCIUM 8.7*  --  8.9 8.5* 8.2*  CREATININE 0.41*  --  0.44 0.54 0.51  GFRNONAA >60  --  >60 >60 >60  GFRAA >60  --  >60 >60 >60    LIVER FUNCTION TESTS: Recent Labs    07/19/17 1804 07/26/17 0651 02/19/18 1149 04/06/18 1701  BILITOT 0.1* 0.6 0.5 1.0  AST 17 19 15 20   ALT 11* 15 12* 13  ALKPHOS 43 44 52 93    PROT 6.3* 7.1 7.6 8.1  ALBUMIN 3.6 3.9 4.2 3.6    Assessment and Plan:  Follow-up CT today demonstrates no residual perirectal fluid at the level of the percutaneous drainage catheter.  No new abscess is identified.  There is no evidence of obvious rectal inflammation by CT.  Injection of the indwelling drainage catheter with contrast under fluoroscopy today demonstrates no evidence of a fistula to the lumen of the rectum.  Injected contrast material outlines the wall of the rectum and is seen surrounding the rectum with no intraluminal contrast identified.  The percutaneous drainage catheter was removed today without complication.  The patient will follow up with Dr. Michaell Cowing.   Electronically SignedIrish Lack T 04/29/2018, 1:07 PM     I spent a total of 10 Minutes in face to face in clinical consultation, greater than 50% of which was counseling/coordinating care post drainage of a perirectal postoperative fluid collection.

## 2019-01-23 IMAGING — CT CT ABD-PELV W/ CM
3 of 4 series · 12 of 36 positions shown, 18 images · IV contrast (READICAT/WATER & [ID] ISOVUE 300)
Comparison: 04/02/2017

CLINICAL DATA: Follow-up for perirectal abscess.

EXAM:
CT ABDOMEN AND PELVIS WITH CONTRAST
TECHNIQUE: Multidetector CT imaging of the abdomen and pelvis was performed
using the standard protocol following bolus administration of
intravenous contrast.
CONTRAST:  100mL UBEAML-PLL IOPAMIDOL (UBEAML-PLL) INJECTION 61%

[Series 3: abd/pelvis with · axial · 0.84mm/px · z∈[-382,-72]mm · 7 of 84 slices shown, 12 images]
[im 11/84  soft-tissue]
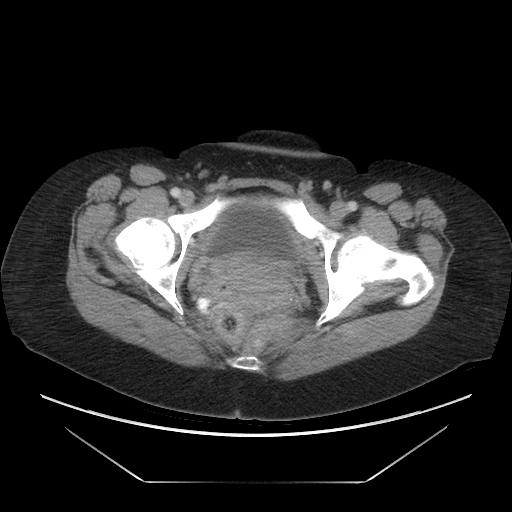
[im 11/84  bone]
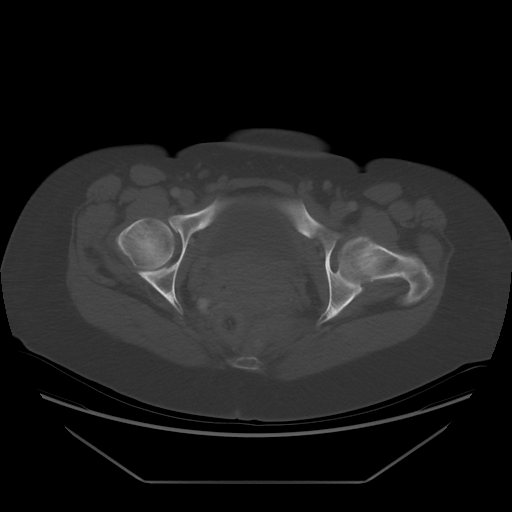
[im 21/84  soft-tissue]
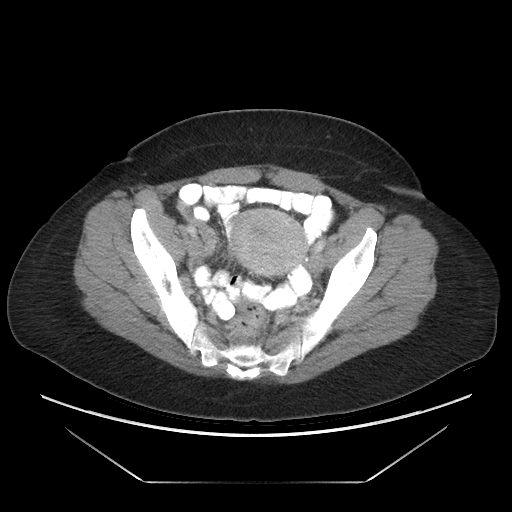
[im 32/84  soft-tissue]
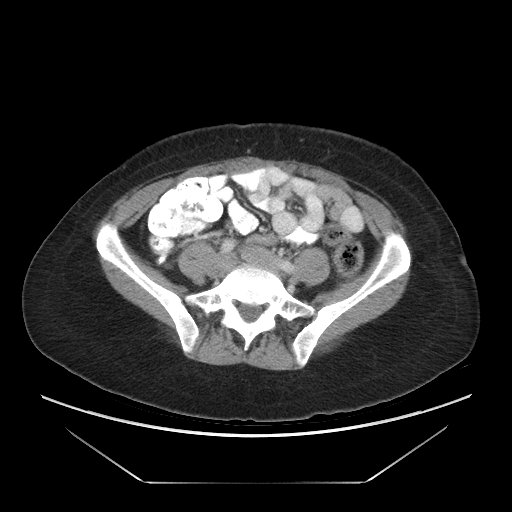
[im 42/84  soft-tissue]
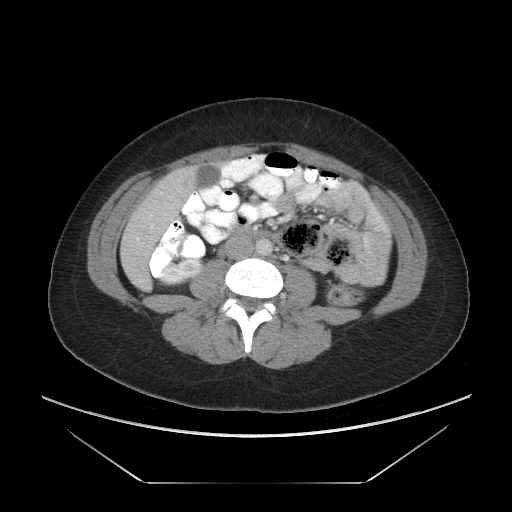
[im 42/84  lung]
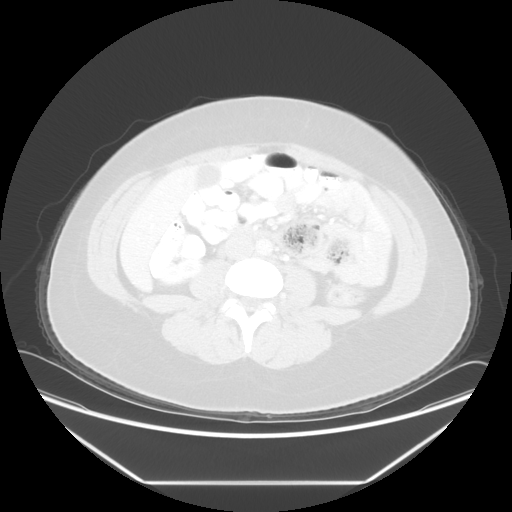
[im 52/84  soft-tissue]
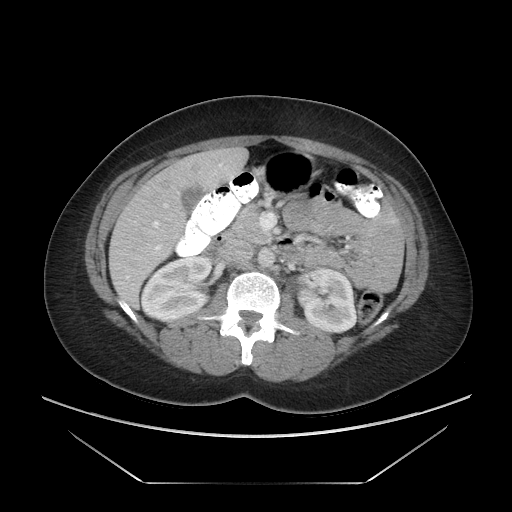
[im 52/84  lung]
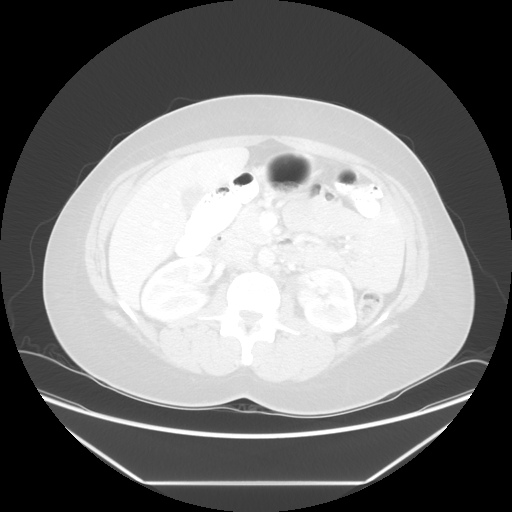
[im 63/84  soft-tissue]
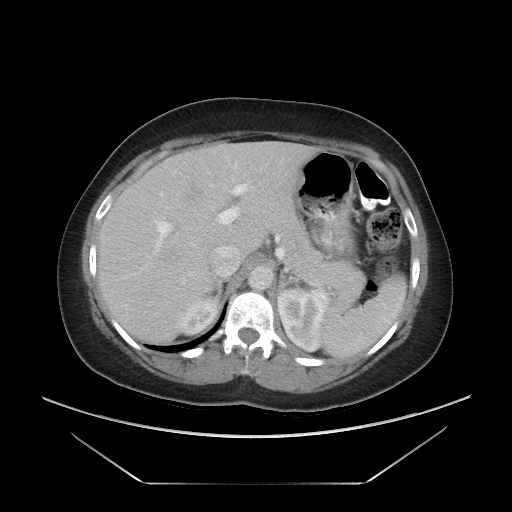
[im 63/84  lung]
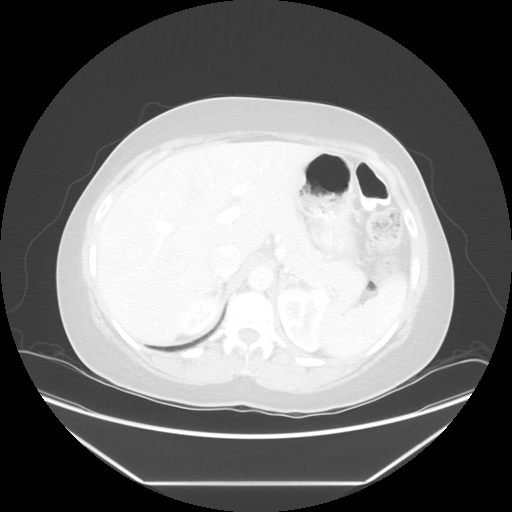
[im 73/84  soft-tissue]
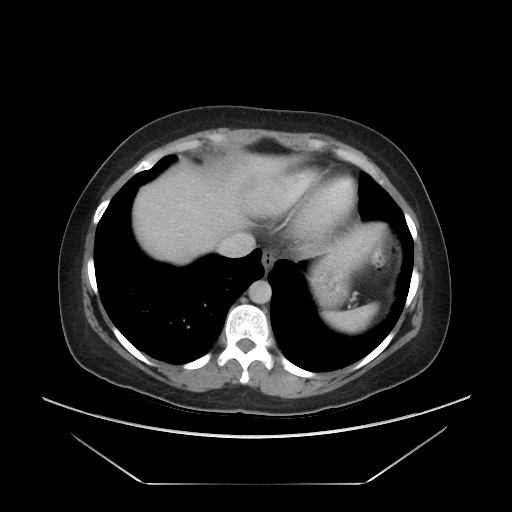
[im 73/84  lung]
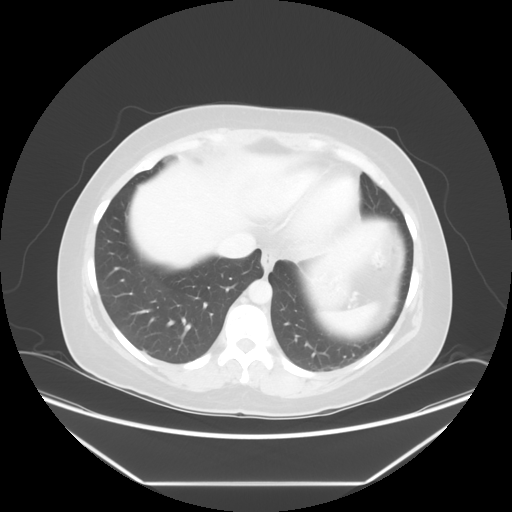

[Series 601: coronal body · coronal · 0.94mm/px · 1 of 114 slices shown, 2 images]
[im 38/114  soft-tissue]
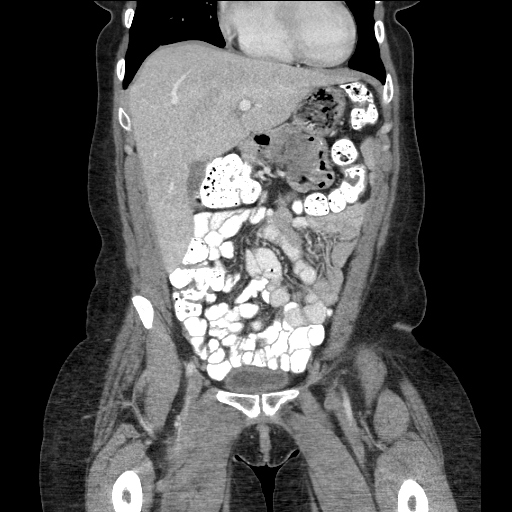
[im 38/114  bone]
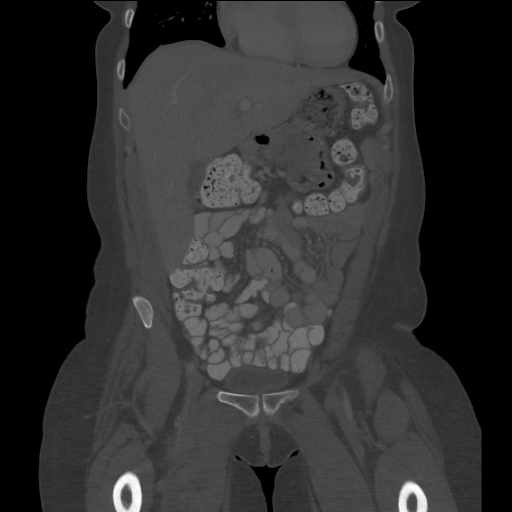

[Series 602: sagittal body · sagittal · 0.94mm/px · 4 of 174 slices shown]
[im 19/174  soft-tissue]
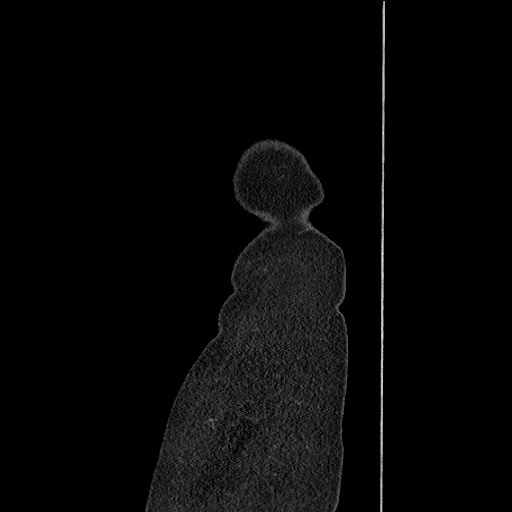
[im 37/174  soft-tissue]
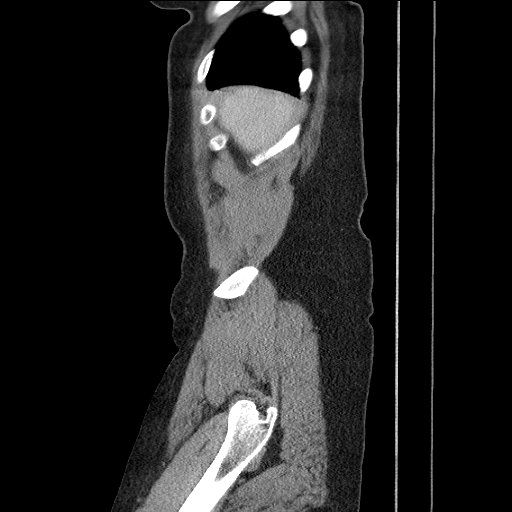
[im 55/174  soft-tissue]
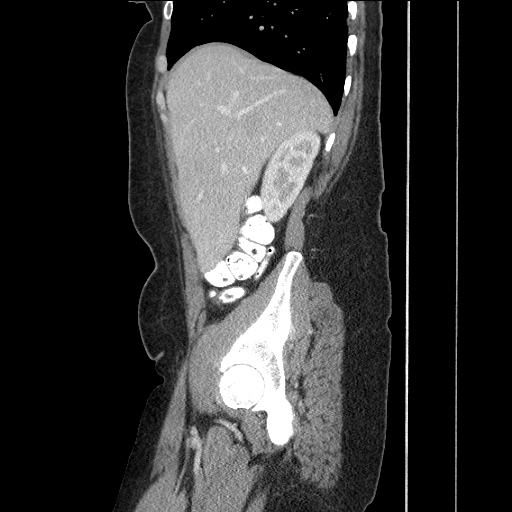
[im 73/174  soft-tissue]
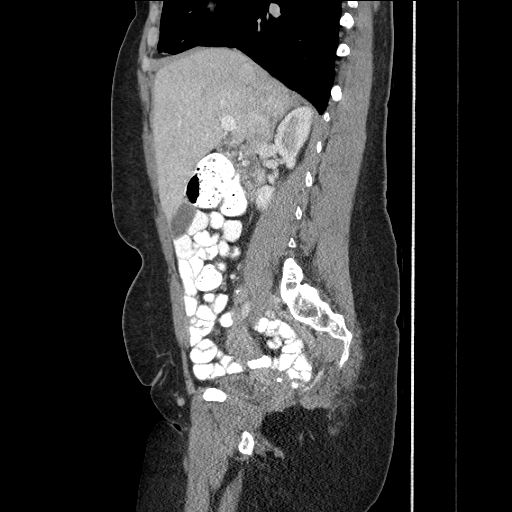

[12 of 36 positions shown; findings below may reference images not displayed]

FINDINGS: Lower chest:  Unremarkable.

Hepatobiliary: Small area of low attenuation in the anterior liver,
adjacent to the falciform ligament, is in a characteristic location
for focal fatty deposition. Liver otherwise unremarkable. No
intrahepatic or extrahepatic biliary dilation.

Pancreas: No focal mass lesion. No dilatation of the main duct. No
intraparenchymal cyst. No peripancreatic edema.

Spleen: No splenomegaly. No focal mass lesion.

Adrenals/Urinary Tract: No adrenal nodule or mass. Kidneys are
unremarkable. No evidence for hydroureter. The urinary bladder
appears normal for the degree of distention.

Stomach/Bowel: Stomach is nondistended. No gastric wall thickening.
No evidence of outlet obstruction. Duodenum is normally positioned
as is the ligament of Treitz. No small bowel wall thickening. No
small bowel dilatation. The terminal ileum is normal. The appendix
is normal. No gross colonic mass. No colonic wall thickening. No
substantial diverticular change. Left perirectal fluid collection
has reaccumulated, measuring 4.0 x 3.2 cm which compares to 4.8 x
3.7 cm on the exam of 03/13/2017. This represents the more cranial
of the 2 fluid collections identified on the 03/21/2017 exam. The
enhancing presacral nodular structures persist [REDACTED] represent
lymph nodes, granulation tissue or even varices given the similar
attenuation to blood pool. The fluid collection seen posterior to
the rectum on the 03/21/2017 exam is no longer evident.

Vascular/Lymphatic: No abdominal aortic aneurysm. No abdominal
aortic atherosclerotic calcification. There is no gastrohepatic or
hepatoduodenal ligament lymphadenopathy. No intraperitoneal or
retroperitoneal lymphadenopathy. No pelvic sidewall lymphadenopathy.

Reproductive: The uterus has normal CT imaging appearance. There is
no adnexal mass.

Other: No intraperitoneal free fluid.

Musculoskeletal: Bone windows reveal no worrisome lytic or sclerotic
osseous lesions.
IMPRESSION: 1. Re-accumulation of left perirectal fluid collection, known to be
abscess previously. This represents the more cranial of the 2
collections identified on the 03/21/2017 exam. The other fluid
collection seen previously posterior to the rectum/anus has not re-
accumulated.
2. Stable enhancing nodular structures in the presacral space,
possibly lymph nodes are granulation tissue although vascular
etiology not excluded. These are stable comparing back to an exam
from 05/28/2015.

## 2019-04-01 ENCOUNTER — Other Ambulatory Visit: Payer: Self-pay | Admitting: Surgery

## 2019-04-01 ENCOUNTER — Other Ambulatory Visit (HOSPITAL_COMMUNITY): Payer: Self-pay | Admitting: Surgery

## 2019-04-01 DIAGNOSIS — R109 Unspecified abdominal pain: Secondary | ICD-10-CM

## 2019-04-01 DIAGNOSIS — K611 Rectal abscess: Secondary | ICD-10-CM

## 2019-04-01 DIAGNOSIS — M549 Dorsalgia, unspecified: Secondary | ICD-10-CM

## 2019-04-02 ENCOUNTER — Ambulatory Visit (HOSPITAL_COMMUNITY)
Admission: RE | Admit: 2019-04-02 | Discharge: 2019-04-02 | Disposition: A | Discharge status: Home / Self Care | Payer: Medicaid Other | Source: Ambulatory Visit | Attending: Surgery | Admitting: Surgery

## 2019-04-02 DIAGNOSIS — M549 Dorsalgia, unspecified: Secondary | ICD-10-CM | POA: Insufficient documentation

## 2019-04-02 DIAGNOSIS — K611 Rectal abscess: Secondary | ICD-10-CM | POA: Insufficient documentation

## 2019-04-02 DIAGNOSIS — R109 Unspecified abdominal pain: Secondary | ICD-10-CM | POA: Diagnosis present

## 2019-04-02 MED ORDER — SODIUM CHLORIDE (PF) 0.9 % IJ SOLN
INTRAMUSCULAR | Status: AC
  Filled 2019-04-02: qty 50

## 2019-04-02 MED ORDER — IOHEXOL 300 MG/ML  SOLN
100.0000 mL | Freq: Once | INTRAMUSCULAR | Status: AC | PRN
  Administered 2019-04-02: 100 mL via INTRAVENOUS

## 2019-04-10 ENCOUNTER — Other Ambulatory Visit: Payer: Medicaid Other

## 2019-08-15 ENCOUNTER — Other Ambulatory Visit: Payer: Self-pay | Admitting: Pediatrics

## 2019-08-15 DIAGNOSIS — Z207 Contact with and (suspected) exposure to pediculosis, acariasis and other infestations: Secondary | ICD-10-CM

## 2019-08-15 DIAGNOSIS — Z2089 Contact with and (suspected) exposure to other communicable diseases: Secondary | ICD-10-CM

## 2019-08-15 MED ORDER — PERMETHRIN 5 % EX CREA: 1.0000 "application " | TOPICAL_CREAM | Freq: Once | CUTANEOUS | 0 refills | Status: AC

## 2019-08-15 NOTE — Progress Notes (Signed)
Parent called on call nurse with concerns that  siblings all have papular lesions and intense itching, identical rash to what she had when she had scabies going through the house some months ago.  Currently at work. Clinic closed until Monday. Would like refill on permethrin topical meds. Discussed management and mitigation efforts with her.  She verbalizes appreciation.   Theodis Sato, MD MPH [facility]

## 2020-01-07 ENCOUNTER — Ambulatory Visit: Payer: Medicaid Other | Attending: Internal Medicine

## 2020-01-07 DIAGNOSIS — Z23 Encounter for immunization: Secondary | ICD-10-CM

## 2020-01-07 NOTE — Progress Notes (Signed)
   Covid-19 Vaccination Clinic  Name:  HERTA HINK    MRN: [id number] DOB: Oct 27, 1980  01/07/2020  Ms. Crisanti was observed post Covid-19 immunization for 15 minutes without incident. She was provided with Vaccine Information Sheet and instruction to access the V-Safe system.   Ms. Soutar was instructed to call 911 with any severe reactions post vaccine: Marland Kitchen Difficulty breathing  . Swelling of face and throat  . A fast heartbeat  . A bad rash all over body  . Dizziness and weakness   Immunizations Administered    Name Date Dose VIS Date Route   Pfizer COVID-19 Vaccine 01/07/2020  9:42 AM 0.3 mL 09/04/2019 Intramuscular   Manufacturer: [employer], [employer]   Lot: [id number]   NDC: [address]-0

## 2020-02-01 ENCOUNTER — Ambulatory Visit: Payer: Medicaid Other | Attending: Internal Medicine

## 2020-02-01 DIAGNOSIS — Z23 Encounter for immunization: Secondary | ICD-10-CM

## 2020-02-01 NOTE — Progress Notes (Signed)
   Covid-19 Vaccination Clinic  Name:  Stephanie [name]    MRN: [id number] DOB: 18-Apr-1981  02/01/2020  [name] was observed post Covid-19 immunization for 15 minutes without incident. She was provided with Vaccine Information Sheet and instruction to access the V-Safe system.   [name] was instructed to call 911 with any severe reactions post vaccine: Marland Kitchen Difficulty breathing  . Swelling of face and throat  . A fast heartbeat  . A bad rash all over body  . Dizziness and weakness   Immunizations Administered    Name Date Dose VIS Date Route   Pfizer COVID-19 Vaccine 02/01/2020  8:43 AM 0.3 mL 11/18/2018 Intramuscular   Manufacturer: [employer], [employer]   Lot: [id number]   NDC: [address]-1

## 2020-02-02 ENCOUNTER — Other Ambulatory Visit: Payer: Self-pay

## 2020-02-02 ENCOUNTER — Ambulatory Visit (INDEPENDENT_AMBULATORY_CARE_PROVIDER_SITE_OTHER)
Admission: RE | Admit: 2020-02-02 | Discharge: 2020-02-02 | Disposition: A | Discharge status: Home / Self Care | Payer: Medicaid Other | Source: Ambulatory Visit

## 2020-02-02 DIAGNOSIS — N898 Other specified noninflammatory disorders of vagina: Secondary | ICD-10-CM

## 2020-02-02 MED ORDER — METRONIDAZOLE 500 MG PO TABS: 500.0000 mg | ORAL_TABLET | Freq: Two times a day (BID) | ORAL | 0 refills | Status: DC

## 2020-02-02 NOTE — Discharge Instructions (Signed)
Based on symptoms will treat for BV Prescribed metronidazole 500 mg twice daily for 7 days (do not take while consuming alcohol and/or if breastfeeding) Instructed patient to follow up in person if symptoms did not improve Follow up person or go to ER if you have any new or worsening symptoms fever, chills, nausea, vomiting, abdominal or pelvic pain, painful intercourse, vaginal discharge, vaginal bleeding, persistent symptoms despite treatment, etc..Marland Kitchen

## 2020-02-02 NOTE — ED Provider Notes (Signed)
[facility] Virtual Visit via Video Note:  Stephanie [name]  initiated request for Telemedicine visit with [facility] team. I connected with [name]  on 02/02/2020 at 6:05 PM  for a synchronized telemedicine visit using a video enabled HIPPA compliant telemedicine application. I verified that I am speaking with [name]  using two identifiers. Rennis Harding, PA-C  was physically located in a [facility] Urgent care site and JASLIN NOVITSKI was located at a different location.   The limitations of evaluation and management by telemedicine as well as the availability of in-person appointments were discussed. Patient was informed that she  may incur a bill ( including co-pay) for this virtual visit encounter. Stephanie [name]  expressed understanding and gave verbal consent to proceed with virtual visit.  [id number] 02/02/20 Arrival Time: 1751   CC: VAGINAL DISCHARGE  SUBJECTIVE:  Stephanie [name] is a 39 y.o. female who presents with complaints of vaginal discharge and vaginal odor x 2-3 days.  Last sex 3 days ago.  Patient is sexually active with 1 female partner over the past 6 month.  Denies concern for STIs.  Describes discharge as thin and cream.  She has tried OTC medications without relief.  Denies aggravating factors.  She reports similar symptoms in the past and was diagnosed with BV.  Reports back discomfort.  She denies fever, chills, nausea, vomiting, abdominal or pelvic pain, urinary symptoms, vaginal itching, vaginal bleeding, dyspareunia, vaginal rashes or lesions.   Hx of perirectal abscess.    LMP: 01/15/20 Current birth control method: None; tubal ligation Compliant with BC:  ROS: As per HPI.  All other pertinent ROS negative.     Past Medical History:  Diagnosis Date  . Anemia   . Depression   . Gestational diabetes mellitus    gestational only  . H/O cesarean section 2010  . Headache    migraines  . Hemorrhoids   .  Hypertension   . Panic attacks   . PTSD (post-traumatic stress disorder)   . Schizophrenia (HCC)   . Vaginal delivery 2004 , 2008, 2009   Past Surgical History:  Procedure Laterality Date  . CESAREAN SECTION    . COLONOSCOPY WITH PROPOFOL  08/19/2017   Procedure: COLONOSCOPY WITH PROPOFOL;  Surgeon: Andria Meuse, MD;  Location: [facility];  Service: General;;  . COLONOSCOPY WITH PROPOFOL N/A 02/21/2018   Procedure: COLONOSCOPY WITH PROPOFOL;  Surgeon: Hilarie Fredrickson, MD;  [facility];  Service: Endoscopy;  Laterality: N/A;  . EVALUATION UNDER ANESTHESIA WITH HEMORRHOIDECTOMY N/A 02/26/2018   Procedure: EXAM UNDER ANESTHESIA WITH HEMORRHOIDAL LIGATION AND PEXY, EXTERNAL HEMORRHOIDECTOMY X2;  Surgeon: Karie Soda, MD;  Location: [facility];  Service: General;  Laterality: N/A;  . FLEXIBLE SIGMOIDOSCOPY  09/25/2017   Procedure: FLEXIBLE SIGMOIDOSCOPY;  Surgeon: Andria Meuse, MD;  [facility];  Service: General;;  . i and d perirectal abcess  2016  . INCISION AND DRAINAGE PERIRECTAL ABSCESS N/A 05/28/2015   Procedure: IRRIGATION AND DEBRIDEMENT PERIRECTAL ABSCESS;  Surgeon: Darnell Level, MD;  Location: [facility];  Service: General;  Laterality: N/A;  . IR RADIOLOGIST EVAL & MGMT  04/02/2017  . IR RADIOLOGIST EVAL & MGMT  04/15/2018  . IR RADIOLOGIST EVAL & MGMT  04/29/2018  . TUBAL LIGATION  2010  . XI ROBOTIC ASSISTED LOWER ANTERIOR RESECTION N/A 02/26/2018   Procedure: XI ROBOTIC ASSISTED LYSIS OF ADHESIONS, RETRORECTAL RESECTION OF PRESACRAL CYSTIC MASS, FIREFLY INJECTION;  Surgeon: Michael Boston, MD;  Location: [facility];  Service: General;  Laterality: N/A;   No Known Allergies Current Facility-Administered Medications on File Prior to Encounter  Medication Dose Route Frequency Provider Last Rate Last Admin  . lidocaine (XYLOCAINE) 5 % ointment   Topical QID PRN Coralie Keens, MD       Current Outpatient Medications on File Prior to Encounter  Medication Sig  Dispense Refill  . acetaminophen (TYLENOL) 500 MG tablet Take 2 tablets (1,000 mg total) by mouth every 8 (eight) hours. 30 tablet 0  . amLODipine (NORVASC) 5 MG tablet TK 1 T PO EACH DAY  1  . Aspirin-Salicylamide-Caffeine (BC HEADACHE POWDER PO) Take 1 packet daily as needed by mouth (headaches).    . celecoxib (CELEBREX) 200 MG capsule Take 200 mg by mouth daily.  3  . citalopram (CELEXA) 10 MG tablet Take 1 tablet (10 mg total) by mouth daily. 60 tablet 5  . gabapentin (NEURONTIN) 300 MG capsule Take 1 capsule (300 mg total) by mouth 2 (two) times daily. 40 capsule 1  . hydrOXYzine (ATARAX/VISTARIL) 10 MG tablet Take 1 tablet (10 mg total) by mouth 2 (two) times daily. 60 tablet 5  . Multiple Vitamins-Iron (MULTIVITAMINS WITH IRON) TABS tablet Take 1 tablet by mouth daily.  0  . oxyCODONE (OXY IR/ROXICODONE) 5 MG immediate release tablet Take 1 tablet (5 mg total) by mouth every 6 (six) hours as needed for severe pain. 30 tablet 0  . psyllium (HYDROCIL/METAMUCIL) 95 % PACK Take 1 packet by mouth 2 (two) times daily. 56 each   . SUMAtriptan (IMITREX) 25 MG tablet Take 25 mg every 2 (two) hours as needed by mouth for migraine.  0     OBJECTIVE:  There were no vitals filed for this visit.  General appearance: alert; no distress Eyes: EOMI grossly HENT: normocephalic; atraumatic Neck: supple with FROM Lungs: normal respiratory effort; speaking in full sentences without difficulty Extremities: moves extremities without difficulty Skin: No obvious rashes Neurologic: No facial asymmetries Psychological: alert and cooperative; normal mood and affect  ASSESSMENT & PLAN:  1. Vaginal discharge   2. Vaginal odor     Meds ordered this encounter  Medications  . metroNIDAZOLE (FLAGYL) 500 MG tablet    Sig: Take 1 tablet (500 mg total) by mouth 2 (two) times daily.    Dispense:  14 tablet    Refill:  0    Order Specific Question:   Supervising Provider    Answer:   Raylene Everts  [[id number]]    Based on symptoms will treat for BV Prescribed metronidazole 500 mg twice daily for 7 days (do not take while consuming alcohol and/or if breastfeeding) Instructed patient to follow up in person if symptoms did not improve Follow up person or go to ER if you have any new or worsening symptoms fever, chills, nausea, vomiting, abdominal or pelvic pain, painful intercourse, vaginal discharge, vaginal bleeding, persistent symptoms despite treatment, etc...  I discussed the assessment and treatment plan with the patient. The patient was provided an opportunity to ask questions and all were answered. The patient agreed with the plan and demonstrated an understanding of the instructions.   The patient was advised to call back or seek an in-person evaluation if the symptoms worsen or if the condition fails to improve as anticipated.  I provided 14 minutes of non-face-to-face time during this encounter.  Lestine Box, PA-C  02/02/2020 6:05 PM       Cassandria Drew, Marye Round, PA-C  02/02/20 1810  

## 2020-02-04 ENCOUNTER — Telehealth: Payer: Medicaid Other | Admitting: Family

## 2020-02-04 DIAGNOSIS — M549 Dorsalgia, unspecified: Secondary | ICD-10-CM

## 2020-02-05 ENCOUNTER — Telehealth: Payer: Medicaid Other

## 2020-02-05 NOTE — Progress Notes (Signed)
  I am so sorry this is delayed! There was a confusion with scheduling.   Based on what you shared with me, I feel your condition warrants further evaluation and I recommend that you be seen for a face to face office visit.   Given you have had surgery and having this back pain you need to call your surgeon today and let them know about your pain!!    NOTE: If you entered your credit card information for this eVisit, you will not be charged. You may see a "hold" on your card for the $35 but that hold will drop off and you will not have a charge processed.   If you are having a true medical emergency please call 911.      For an urgent face to face visit, [facility] has five urgent care centers for your convenience:      NEW:  [facility] at [facility] Directions [phone] [address] [place], [place] [address] . 10 am - 6pm Monday - Friday    [facility] Center [facility]) Get Driving Directions [phone] [address] [place], [place] [address] . 10 am to 8 pm Monday-Friday . 12 pm to 8 pm [facility] at [facility] Get Driving Directions [phone] [address] [place], [place] [address] . 8 am to 8 pm Monday-Friday . 9 am to 6 pm Saturday . 11 am to 6 pm Sunday     [facility] at [facility] Get Driving Directions  [phone] [address] [place], [place] [address] . 8 am to 8 pm Monday-Friday . 8 am to 4 pm [facility] at [facility] Directions [phone] [address] [place], [place] [address] . 12 pm to 6 pm Monday-Friday      Your e-visit answers were reviewed by a board certified advanced clinical practitioner to complete your personal care plan.  Thank you for using e-Visits.

## 2020-10-28 ENCOUNTER — Ambulatory Visit
Admission: EM | Admit: 2020-10-28 | Discharge: 2020-10-28 | Disposition: A | Discharge status: Home / Self Care | Payer: Medicaid Other | Attending: Emergency Medicine | Admitting: Emergency Medicine

## 2020-10-28 ENCOUNTER — Other Ambulatory Visit: Payer: Self-pay

## 2020-10-28 ENCOUNTER — Ambulatory Visit (INDEPENDENT_AMBULATORY_CARE_PROVIDER_SITE_OTHER): Payer: Medicaid Other

## 2020-10-28 ENCOUNTER — Other Ambulatory Visit: Payer: Self-pay | Admitting: Orthopedic Surgery

## 2020-10-28 DIAGNOSIS — M79641 Pain in right hand: Secondary | ICD-10-CM

## 2020-10-28 DIAGNOSIS — S62336A Displaced fracture of neck of fifth metacarpal bone, right hand, initial encounter for closed fracture: Secondary | ICD-10-CM | POA: Diagnosis not present

## 2020-10-28 MED ORDER — HYDROCODONE-ACETAMINOPHEN 5-325 MG PO TABS: 1.0000 | ORAL_TABLET | Freq: Four times a day (QID) | ORAL | 0 refills | Status: DC | PRN

## 2020-10-28 MED ORDER — IBUPROFEN 800 MG PO TABS: 800.0000 mg | ORAL_TABLET | Freq: Three times a day (TID) | ORAL | 0 refills | Status: DC
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
## 2020-10-28 NOTE — Discharge Instructions (Signed)
Please follow-up with orthopedics-use contact below Use anti-inflammatories for pain/swelling. You may take up to 800 mg Ibuprofen every 8 hours with food. You may supplement Ibuprofen with Tylenol [phone] mg every 8 hours.  Hydrocodone for severe pain Follow-up if developing any increased swelling to hand, numbness tingling or increased pain while wearing splint

## 2020-10-29 ENCOUNTER — Ambulatory Visit (HOSPITAL_COMMUNITY): Payer: Self-pay

## 2020-10-31 ENCOUNTER — Encounter (HOSPITAL_BASED_OUTPATIENT_CLINIC_OR_DEPARTMENT_OTHER): Payer: Self-pay | Admitting: Orthopedic Surgery

## 2020-10-31 ENCOUNTER — Other Ambulatory Visit: Payer: Self-pay

## 2020-11-03 ENCOUNTER — Other Ambulatory Visit (HOSPITAL_COMMUNITY): Payer: Medicaid Other

## 2020-11-07 ENCOUNTER — Ambulatory Visit (HOSPITAL_BASED_OUTPATIENT_CLINIC_OR_DEPARTMENT_OTHER): Admission: RE | Admit: 2020-11-07 | Payer: Medicaid Other | Source: Ambulatory Visit | Admitting: Orthopedic Surgery

## 2020-11-07 SURGERY — OPEN REDUCTION INTERNAL FIXATION (ORIF) METACARPAL
Anesthesia: Monitor Anesthesia Care | Site: Finger | Laterality: Right

## 2021-06-17 ENCOUNTER — Ambulatory Visit
Admission: EM | Admit: 2021-06-17 | Discharge: 2021-06-17 | Disposition: A | Discharge status: Home / Self Care | Payer: Medicaid Other | Attending: Internal Medicine | Admitting: Internal Medicine

## 2021-06-17 ENCOUNTER — Encounter: Payer: Self-pay | Admitting: General Practice

## 2021-06-17 ENCOUNTER — Other Ambulatory Visit: Payer: Self-pay

## 2021-06-17 DIAGNOSIS — R52 Pain, unspecified: Secondary | ICD-10-CM | POA: Diagnosis not present

## 2021-06-17 DIAGNOSIS — Z20822 Contact with and (suspected) exposure to covid-19: Secondary | ICD-10-CM | POA: Diagnosis present

## 2021-06-17 DIAGNOSIS — R6889 Other general symptoms and signs: Secondary | ICD-10-CM | POA: Insufficient documentation

## 2021-06-17 LAB — POCT RAPID STREP A (OFFICE): Rapid Strep A Screen: NEGATIVE

## 2021-06-17 MED ORDER — CETIRIZINE HCL 10 MG PO TABS
10.0000 mg | ORAL_TABLET | Freq: Every day | ORAL | 0 refills | Status: DC
Start: 2021-06-17 — End: 2021-11-08

## 2021-06-17 MED ORDER — FLUTICASONE PROPIONATE 50 MCG/ACT NA SUSP: 1.0000 | Freq: Every day | NASAL | 0 refills | Status: DC

## 2021-06-17 NOTE — Discharge Instructions (Addendum)
You most likely have a viral infection that is causing symptoms.  You also have fluid in the ears bilaterally.  You have been prescribed cetirizine and Flonase to take to treat this.  Do not take hydroxyzine at same time as cetirizine.  Rapid strep test was negative.  Throat culture, COVID-19, flu swabs pending.  We will call if they are positive.

## 2021-06-17 NOTE — ED Triage Notes (Signed)
Back and shoulder aches, legs are hurting, headaches for many days, hx of high blood pressure. Took BC powder, drank vinegar for BP control. Last saw the pcp in 3 to 4 months, pcp office closed, not taken BP medications in months. Took tylenol 1000 mg yesterday.

## 2021-06-17 NOTE — ED Provider Notes (Signed)
To [facility]    CSN: [id number] Arrival date & time: 06/17/21  1456      History   Chief Complaint Chief Complaint  Patient presents with   Generalized Body Aches    HPI Stephanie [name] is a 40 y.o. female.   Patient presents with body aches that started approximately 3 days ago.  Denies any fever, upper respiratory symptoms, cough, sore throat.  Denies any chest pain or shortness of breath.  Patient states that mother tested positive for COVID-19 a few days ago and she was last with parent 1 week ago.  Has taken Tylenol for body aches.  Denies nausea, vomiting, abdominal pain.  Has had some diarrhea but no blood in stool.    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 02/26/2018 02/26/2018   External hemorrhoids status post hemorrhoidectomy x2, 02/26/2018 02/26/2018   Hypokalemia 02/25/2018   Hypomagnesemia 02/25/2018   Pelvic pain 02/21/2018   Schizophrenia (HCC)    Panic attacks    Hypertension    PTSD (post-traumatic stress disorder)    Constipation, chronic 02/20/2018   Sweaty palms 10/25/2017   Encounter to establish care 10/25/2017   Depression 10/25/2017   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   Prolapsed internal hemorrhoids, grade 2&3, s/p hemorrhoidal ligation/pexy 02/26/2018 05/01/2013    Past Surgical History:  Procedure Laterality Date   CESAREAN SECTION     COLONOSCOPY WITH PROPOFOL  08/19/2017   Procedure: COLONOSCOPY WITH PROPOFOL;  Surgeon: Andria Meuse, MD;  Location: [facility];  Service: General;;   COLONOSCOPY WITH PROPOFOL N/A 02/21/2018   Procedure: COLONOSCOPY WITH PROPOFOL;  Surgeon: Hilarie Fredrickson, MD;  Location: [facility];  Service: Endoscopy;  Laterality: N/A;   EVALUATION UNDER ANESTHESIA WITH HEMORRHOIDECTOMY N/A 02/26/2018   Procedure: EXAM UNDER ANESTHESIA WITH HEMORRHOIDAL LIGATION AND PEXY, EXTERNAL HEMORRHOIDECTOMY X2;  Surgeon: Karie Soda, MD;  Location: [facility];  Service: General;  Laterality: N/A;   FLEXIBLE SIGMOIDOSCOPY  09/25/2017   Procedure: FLEXIBLE SIGMOIDOSCOPY;  Surgeon: Andria Meuse, MD;  Location: [facility];  Service: General;;   i and d perirectal abcess  2016   INCISION AND DRAINAGE PERIRECTAL ABSCESS N/A 05/28/2015   Procedure: IRRIGATION AND DEBRIDEMENT PERIRECTAL ABSCESS;  Surgeon: Darnell Level, MD;  Location: [facility];  Service: General;  Laterality: N/A;   IR RADIOLOGIST EVAL & MGMT  04/02/2017   IR RADIOLOGIST EVAL & MGMT  04/15/2018   IR RADIOLOGIST EVAL & MGMT  04/29/2018   TUBAL LIGATION  2010   XI ROBOTIC ASSISTED LOWER ANTERIOR RESECTION N/A 02/26/2018   Procedure: XI ROBOTIC ASSISTED LYSIS OF ADHESIONS, RETRORECTAL RESECTION OF PRESACRAL CYSTIC MASS, FIREFLY INJECTION;  Surgeon: Karie Soda, MD;  Location: [facility];  Service: General;  Laterality: N/A;    OB History     Gravida  4   Para  4   Term  3   Preterm  1   AB  Living  5      SAB      IAB      Ectopic      Multiple      Live Births               Home Medications    Prior to Admission medications   Medication Sig Start Date End Date Taking? Authorizing Provider  cetirizine (ZYRTEC) 10 MG tablet Take 1 tablet (10 mg total) by mouth daily. 06/17/21  Yes Lance Muss, FNP  fluticasone (FLONASE) 50 MCG/ACT nasal spray Place 1 spray into both nostrils daily. 06/17/21  Yes Lance Muss, FNP  acetaminophen (TYLENOL) 500 MG tablet Take 2 tablets (1,000 mg total) by mouth every 8 (eight) hours.  02/27/18   Adam Phenix, PA-C  amLODipine (NORVASC) 5 MG tablet TK 1 T PO EACH DAY 02/03/18   [provider]  Aspirin-Salicylamide-Caffeine (BC HEADACHE POWDER PO) Take 1 packet daily as needed by mouth (headaches).    [provider]  celecoxib (CELEBREX) 200 MG capsule Take 200 mg by mouth daily. 01/18/18   [provider]  citalopram (CELEXA) 10 MG tablet Take 1 tablet (10 mg total) by mouth daily. 02/27/18   Karie Soda, MD  gabapentin (NEURONTIN) 300 MG capsule Take 1 capsule (300 mg total) by mouth 2 (two) times daily. 04/08/18   Meuth, Lina Sar, PA-C  HYDROcodone-acetaminophen (NORCO/VICODIN) 5-325 MG tablet Take 1-2 tablets by mouth every 6 (six) hours as needed for severe pain. 10/28/20   Wieters, Hallie C, PA-C  hydrOXYzine (ATARAX/VISTARIL) 10 MG tablet Take 1 tablet (10 mg total) by mouth 2 (two) times daily. 02/26/18   Karie Soda, MD  ibuprofen (ADVIL) 800 MG tablet Take 1 tablet (800 mg total) by mouth 3 (three) times daily. 10/28/20   Wieters, Hallie C, PA-C  Multiple Vitamins-Iron (MULTIVITAMINS WITH IRON) TABS tablet Take 1 tablet by mouth daily. 02/28/18   Meuth, Brooke A, PA-C  psyllium (HYDROCIL/METAMUCIL) 95 % PACK Take 1 packet by mouth 2 (two) times daily. 02/27/18   Adam Phenix, PA-C  SUMAtriptan (IMITREX) 25 MG tablet Take 25 mg every 2 (two) hours as needed by mouth for migraine. 07/15/17   [provider]    Family History Family History  Problem Relation Age of Onset   Hypertension Mother    Hypertension Other     Social History Social History   Tobacco Use   Smoking status: Every Day    Packs/day: 0.50    Types: Cigarettes   Smokeless tobacco: Never  Vaping Use   Vaping Use: Never used  Substance Use Topics   Alcohol use: Yes    Comment: ocassionally   Drug use: Yes    Frequency: 3.0 times per week    Types: Marijuana    Comment: occasionally     Allergies   Patient has no known allergies.   Review of  Systems Review of Systems Per HPI  Physical Exam Triage Vital Signs ED Triage Vitals  Enc Vitals Group     BP 06/17/21 1509 (!) 148/99     Pulse Rate 06/17/21 1509 87     Resp 06/17/21 1509 16     Temp 06/17/21 1509 98.7 F (37.1 C)     Temp Source 06/17/21 1509 Oral     SpO2 06/17/21 1509 96 %     Weight --      Height --      Head Circumference --  Peak Flow --      Pain Score 06/17/21 1513 10     Pain Loc --      Pain Edu? --      Excl. in GC? --    No data found.  Updated Vital Signs BP (!) 148/99 (BP Location: Left Arm)   Pulse 87   Temp 98.7 F (37.1 C) (Oral)   Resp 16   LMP 05/31/2021   SpO2 96%   Visual Acuity Right Eye Distance:   Left Eye Distance:   Bilateral Distance:    Right Eye Near:   Left Eye Near:    Bilateral Near:     Physical Exam Constitutional:      General: She is not in acute distress.    Appearance: Normal appearance. She is not ill-appearing, toxic-appearing or diaphoretic.  HENT:     Head: Normocephalic and atraumatic.     Right Ear: Ear canal normal. A middle ear effusion is present. Tympanic membrane is not erythematous or bulging.     Left Ear: Ear canal normal. A middle ear effusion is present. Tympanic membrane is not erythematous or bulging.     Nose: Nose normal.     Mouth/Throat:     Lips: Pink.     Mouth: Mucous membranes are moist.     Pharynx: Posterior oropharyngeal erythema present.  Eyes:     Extraocular Movements: Extraocular movements intact.     Conjunctiva/sclera: Conjunctivae normal.  Cardiovascular:     Rate and Rhythm: Normal rate and regular rhythm.     Pulses: Normal pulses.     Heart sounds: Normal heart sounds.  Pulmonary:     Effort: Pulmonary effort is normal. No respiratory distress.     Breath sounds: Normal breath sounds. No wheezing.  Skin:    General: Skin is warm and dry.  Neurological:     General: No focal deficit present.     Mental Status: She is alert and oriented to person,  place, and time. Mental status is at baseline.  Psychiatric:        Mood and Affect: Mood normal.        Behavior: Behavior normal.        Thought Content: Thought content normal.        Judgment: Judgment normal.     UC Treatments / Results  Labs (all labs ordered are listed, but only abnormal results are displayed) Labs Reviewed  COVID-19, FLU A+B NAA  CULTURE, GROUP A STREP [facility])  POCT RAPID STREP A (OFFICE)    EKG   Radiology No results found.  Procedures Procedures (including critical care time)  Medications Ordered in UC Medications - No data to display  Initial Impression / Assessment and Plan / UC Course  I have reviewed the triage vital signs and the nursing notes.  Pertinent labs & imaging results that were available during my care of the patient were reviewed by me and considered in my medical decision making (see chart for details).     Suspect that body aches are related to viral infection due to physical exam.  COVID-19, flu swab pending.  Rapid strep was negative.  Throat culture is pending.  Suggested ibuprofen and Tylenol as needed for body aches.  cetirizine and Flonase for bilateral middle ear effusion.  Advised patient to not take hydroxyzine and cetirizine together.  No red flag signs on exam.  Suspect headache is related to acute illness and middle ear effusion.  No signs of hypertensive  urgency.  Patient is nontoxic-appearing and does not appear to be in need of immediate medical attention at the hospital at this time.Discussed strict return precautions. Patient verbalized understanding and is agreeable with plan.  Final Clinical Impressions(s) / UC Diagnoses   Final diagnoses:  Body aches  Encounter for laboratory testing for COVID-19 virus  Flu-like symptoms     Discharge Instructions      You most likely have a viral infection that is causing symptoms.  You also have fluid in the ears bilaterally.  You have been prescribed cetirizine and  Flonase to take to treat this.  Do not take hydroxyzine at same time as cetirizine.  Rapid strep test was negative.  Throat culture, COVID-19, flu swabs pending.  We will call if they are positive.     ED Prescriptions     Medication Sig Dispense Auth. Provider   cetirizine (ZYRTEC) 10 MG tablet Take 1 tablet (10 mg total) by mouth daily. 30 tablet Lance Muss, FNP   fluticasone (FLONASE) 50 MCG/ACT nasal spray Place 1 spray into both nostrils daily. 16 g Lance Muss, FNP      PDMP not reviewed this encounter.   Lance Muss, FNP 06/17/21 1544

## 2021-06-19 LAB — COVID-19, FLU A+B NAA
Influenza A, NAA: NOT DETECTED
Influenza B, NAA: NOT DETECTED
SARS-CoV-2, NAA: NOT DETECTED

## 2021-06-21 LAB — CULTURE, GROUP A STREP (THRC)

## 2021-11-08 ENCOUNTER — Telehealth: Payer: Medicaid Other | Admitting: Physician Assistant

## 2021-11-08 DIAGNOSIS — R3989 Other symptoms and signs involving the genitourinary system: Secondary | ICD-10-CM | POA: Diagnosis not present

## 2021-11-08 NOTE — Progress Notes (Signed)
Virtual Visit Consent   Stephanie [name], you are scheduled for a virtual visit with a [facility] provider today.     Just as with appointments in the office, your consent must be obtained to participate.  Your consent will be active for this visit and any virtual visit you may have with one of our providers in the next 365 days.     If you have a MyChart account, a copy of this consent can be sent to you electronically.  All virtual visits are billed to your insurance company just like a traditional visit in the office.    As this is a virtual visit, video technology does not allow for your provider to perform a traditional examination.  This may limit your provider's ability to fully assess your condition.  If your provider identifies any concerns that need to be evaluated in person or the need to arrange testing (such as labs, EKG, etc.), we will make arrangements to do so.     Although advances in technology are sophisticated, we cannot ensure that it will always work on either your end or our end.  If the connection with a video visit is poor, the visit may have to be switched to a telephone visit.  With either a video or telephone visit, we are not always able to ensure that we have a secure connection.     I need to obtain your verbal consent now.   Are you willing to proceed with your visit today?    Stephanie [name] has provided verbal consent on 11/08/2021 for a virtual visit (video or telephone).   Leeanne Rio, [place]   Date: 11/08/2021 1:44 PM   Virtual Visit via Video Note   I, Leeanne Rio, connected with  Stephanie [name]  ([id number], Aug 14, 1981) on 11/08/21 at  1:30 PM EST by a video-enabled telemedicine application and verified that I am speaking with the correct person using two identifiers.  Location: Patient: Virtual Visit Location Patient: Home Provider: Virtual Visit Location Provider: Home Office   I discussed the limitations of evaluation and  management by telemedicine and the availability of in person appointments. The patient expressed understanding and agreed to proceed.    History of Present Illness: Stephanie [name] is a 41 y.o. who identifies as a female who was assigned female at birth, and is being seen today for urinary urgency and frequency with cloudy urine x 2 weeks. Notes some suprapubic pressure and urinary hesitancy. Denies any dysuria. Is staying well-hydrated. Denies vaginal pain, discharge. Is s/p tubal ligation. Denies change in stool. Denies any polyuria, polydipsia or polyphagia. Has history of perirectal abscess requiring a robotic-assisted retrorectal presacral cystic mass in 2019. No issue after recovery from surgery.   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 02/26/2018 02/26/2018   External hemorrhoids status post hemorrhoidectomy x2, 02/26/2018 02/26/2018   Hypokalemia 02/25/2018   Hypomagnesemia 02/25/2018   Pelvic pain 02/21/2018   Schizophrenia ([place])    Panic attacks    Hypertension    PTSD (post-traumatic stress disorder)    Constipation, chronic 02/20/2018   Sweaty palms 10/25/2017   Encounter to establish care 10/25/2017   Depression 10/25/2017   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  Observations/Objective: Patient is well-developed, well-nourished in no acute distress.  Resting comfortably at home.  Head is normocephalic, atraumatic.  No labored breathing. Speech is clear and coherent with logical content.  Patient is alert and oriented at baseline.   Assessment and Plan: 1. Suspected UTI  History of UTI. Similar  symptoms to prior episodes per patient. Will treat empirically for UTI with Keflex 500 mg BID x 7 days. Supportive measures and OTC medications reviewed. Do not think this is related to prior surgery and pelvic issues giving absence of pain and other symptoms, but if not resolving she will need a further evaluation. Sent her a link to help her get scheduled with a new PCP for ongoing medical screenings/care. Strict UC/ER precautions reviewed.   Follow Up Instructions: I discussed the assessment and treatment plan with the patient. The patient was provided an opportunity to ask questions and all were answered. The patient agreed with the plan and demonstrated an understanding of the instructions.  A copy of instructions were sent to the patient via MyChart unless otherwise noted below.   The patient was advised to call back or seek an in-person evaluation if the symptoms worsen or if the condition fails to improve as anticipated.  Time:  I spent 12 minutes with the patient via telehealth technology discussing the above problems/concerns.    Leeanne Rio, PA-C

## 2021-11-08 NOTE — Patient Instructions (Signed)
Stephanie [name], thank you for joining Piedad Climes, PA-C for today's virtual visit.  While this provider is not your primary care provider (PCP), if your PCP is located in our provider database this encounter information will be shared with them immediately following your visit.  Consent: (Patient) Stephanie [name] provided verbal consent for this virtual visit at the beginning of the encounter.  Current Medications:  Current Outpatient Medications:    acetaminophen (TYLENOL) 500 MG tablet, Take 2 tablets (1,000 mg total) by mouth every 8 (eight) hours., Disp: 30 tablet, Rfl: 0   amLODipine (NORVASC) 5 MG tablet, TK 1 T PO EACH DAY, Disp: , Rfl: 1   Aspirin-Salicylamide-Caffeine (BC HEADACHE POWDER PO), Take 1 packet daily as needed by mouth (headaches)., Disp: , Rfl:    celecoxib (CELEBREX) 200 MG capsule, Take 200 mg by mouth daily., Disp: , Rfl: 3   cetirizine (ZYRTEC) 10 MG tablet, Take 1 tablet (10 mg total) by mouth daily., Disp: 30 tablet, Rfl: 0   citalopram (CELEXA) 10 MG tablet, Take 1 tablet (10 mg total) by mouth daily., Disp: 60 tablet, Rfl: 5   fluticasone (FLONASE) 50 MCG/ACT nasal spray, Place 1 spray into both nostrils daily., Disp: 16 g, Rfl: 0   gabapentin (NEURONTIN) 300 MG capsule, Take 1 capsule (300 mg total) by mouth 2 (two) times daily., Disp: 40 capsule, Rfl: 1   HYDROcodone-acetaminophen (NORCO/VICODIN) 5-325 MG tablet, Take 1-2 tablets by mouth every 6 (six) hours as needed for severe pain., Disp: 15 tablet, Rfl: 0   hydrOXYzine (ATARAX/VISTARIL) 10 MG tablet, Take 1 tablet (10 mg total) by mouth 2 (two) times daily., Disp: 60 tablet, Rfl: 5   ibuprofen (ADVIL) 800 MG tablet, Take 1 tablet (800 mg total) by mouth 3 (three) times daily., Disp: 21 tablet, Rfl: 0   Multiple Vitamins-Iron (MULTIVITAMINS WITH IRON) TABS tablet, Take 1 tablet by mouth daily., Disp: , Rfl: 0   psyllium (HYDROCIL/METAMUCIL) 95 % PACK, Take 1 packet by mouth 2 (two) times daily.,  Disp: 56 each, Rfl:    SUMAtriptan (IMITREX) 25 MG tablet, Take 25 mg every 2 (two) hours as needed by mouth for migraine., Disp: , Rfl: 0  Current Facility-Administered Medications:    lidocaine (XYLOCAINE) 5 % ointment, , Topical, QID PRN, Abigail Miyamoto, MD   Medications ordered in this encounter:  No orders of the defined types were placed in this encounter.    *If you need refills on other medications prior to your next appointment, please contact your pharmacy*  Follow-Up: Call back or seek an in-person evaluation if the symptoms worsen or if the condition fails to improve as anticipated.  Other Instructions Your symptoms are consistent with a bladder infection, also called acute cystitis. Please take your antibiotic (Keflex) as directed until all pills are gone.  Stay very well hydrated.  Consider a daily probiotic (Align, Culturelle, or Activia) to help prevent stomach upset caused by the antibiotic.  Taking a probiotic daily may also help prevent recurrent UTIs.  Also consider taking AZO (Phenazopyridine) tablets to help decrease pain with urination.    I want you to use the link below to help get you set up with a new Primary Care Provider.    Urinary Tract Infection A urinary tract infection (UTI) can occur any place along the urinary tract. The tract includes the kidneys, ureters, bladder, and urethra. A type of germ called bacteria often causes a UTI. UTIs are often helped with antibiotic medicine.  HOME  CARE  If given, take antibiotics as told by your doctor. Finish them even if you start to feel better. Drink enough fluids to keep your pee (urine) clear or pale yellow. Avoid tea, drinks with caffeine, and bubbly (carbonated) drinks. Pee often. Avoid holding your pee in for a long time. Pee before and after having sex (intercourse). Wipe from front to back after you poop (bowel movement) if you are a woman. Use each tissue only once. GET HELP RIGHT AWAY IF:  You have  back pain. You have lower belly (abdominal) pain. You have chills. You feel sick to your stomach (nauseous). You throw up (vomit). Your burning or discomfort with peeing does not go away. You have a fever. Your symptoms are not better in 3 days. MAKE SURE YOU:  Understand these instructions. Will watch your condition. Will get help right away if you are not doing well or get worse. Document Released: 02/27/2008 Document Revised: 06/04/2012 Document Reviewed: 04/10/2012 [facility] Patient Information 2015 [place], [place]. This information is not intended to replace advice given to you by your health care provider. Make sure you discuss any questions you have with your health care provider.  If you or a family member do not have a primary care provider, use the link below to schedule a visit and establish care. When you choose a [facility] primary care physician or advanced practice provider, you gain a long-term partner in health. Find a Primary Care Provider  Learn more about [facility] in-office and virtual care options:  - Get Care Now

## 2022-02-26 ENCOUNTER — Telehealth: Payer: Medicaid Other | Admitting: Physician Assistant

## 2022-02-26 DIAGNOSIS — R3989 Other symptoms and signs involving the genitourinary system: Secondary | ICD-10-CM

## 2022-02-26 MED ORDER — SULFAMETHOXAZOLE-TRIMETHOPRIM 800-160 MG PO TABS: 1.0000 | ORAL_TABLET | Freq: Two times a day (BID) | ORAL | 0 refills | Status: DC

## 2022-02-26 NOTE — Patient Instructions (Signed)
Stephanie [name], thank you for joining [name], PA-C for today's virtual visit.  While this provider is not your primary care provider (PCP), if your PCP is located in our provider database this encounter information will be shared with them immediately following your visit.  Consent: (Patient) Stephanie [name] provided verbal consent for this virtual visit at the beginning of the encounter.  Current Medications:  Current Outpatient Medications:    sulfamethoxazole-trimethoprim (BACTRIM DS) 800-160 MG tablet, Take 1 tablet by mouth 2 (two) times daily., Disp: 10 tablet, Rfl: 0   Medications ordered in this encounter:  Meds ordered this encounter  Medications   sulfamethoxazole-trimethoprim (BACTRIM DS) 800-160 MG tablet    Sig: Take 1 tablet by mouth 2 (two) times daily.    Dispense:  10 tablet    Refill:  0    Order Specific Question:   Supervising Provider    Answer:   Hyacinth Meeker, BRIAN [[id number]]     *If you need refills on other medications prior to your next appointment, please contact your pharmacy*  Follow-Up: Call back or seek an in-person evaluation if the symptoms worsen or if the condition fails to improve as anticipated.  Other Instructions Urinary Tract Infection, Adult A urinary tract infection (UTI) is an infection of any part of the urinary tract. The urinary tract includes: The kidneys. The ureters. The bladder. The urethra. These organs make, store, and get rid of pee (urine) in the body. What are the causes? This infection is caused by germs (bacteria) in your genital area. These germs grow and cause swelling (inflammation) of your urinary tract. What increases the risk? The following factors may make you more likely to develop this condition: Using a small, thin tube (catheter) to drain pee. Not being able to control when you pee or poop (incontinence). Being female. If you are female, these things can increase the risk: Using these methods to  prevent pregnancy: A medicine that kills sperm (spermicide). A device that blocks sperm (diaphragm). Having low levels of a female hormone (estrogen). Being pregnant. You are more likely to develop this condition if: You have genes that add to your risk. You are sexually active. You take antibiotic medicines. You have trouble peeing because of: A prostate that is bigger than normal, if you are female. A blockage in the part of your body that drains pee from the bladder. A kidney stone. A nerve condition that affects your bladder. Not getting enough to drink. Not peeing often enough. You have other conditions, such as: Diabetes. A weak disease-fighting system (immune system). Sickle cell disease. Gout. Injury of the spine. What are the signs or symptoms? Symptoms of this condition include: Needing to pee right away. Peeing small amounts often. Pain or burning when peeing. Blood in the pee. Pee that smells bad or not like normal. Trouble peeing. Pee that is cloudy. Fluid coming from the vagina, if you are female. Pain in the belly or lower back. Other symptoms include: Vomiting. Not feeling hungry. Feeling mixed up (confused). This may be the first symptom in older adults. Being tired and grouchy (irritable). A fever. Watery poop (diarrhea). How is this treated? Taking antibiotic medicine. Taking other medicines. Drinking enough water. In some cases, you may need to see a specialist. Follow these instructions at home:  Medicines Take over-the-counter and prescription medicines only as told by your doctor. If you were prescribed an antibiotic medicine, take it as told by your doctor. Do not stop taking it  even if you start to feel better. General instructions Make sure you: Pee until your bladder is empty. Do not hold pee for a long time. Empty your bladder after sex. Wipe from front to back after peeing or pooping if you are a female. Use each tissue one time when  you wipe. Drink enough fluid to keep your pee pale yellow. Keep all follow-up visits. Contact a doctor if: You do not get better after 1-2 days. Your symptoms go away and then come back. Get help right away if: You have very bad back pain. You have very bad pain in your lower belly. You have a fever. You have chills. You feeling like you will vomit or you vomit. Summary A urinary tract infection (UTI) is an infection of any part of the urinary tract. This condition is caused by germs in your genital area. There are many risk factors for a UTI. Treatment includes antibiotic medicines. Drink enough fluid to keep your pee pale yellow. This information is not intended to replace advice given to you by your health care provider. Make sure you discuss any questions you have with your health care provider. Document Revised: 04/22/2020 Document Reviewed: 04/22/2020 Elsevier Patient Education  2023 Elsevier Inc.    If you have been instructed to have an in-person evaluation today at a local Urgent Care facility, please use the link below. It will take you to a list of all of our available [facility], including address, phone number and hours of operation. Please do not delay care.  [facility]  If you or a family member do not have a primary care provider, use the link below to schedule a visit and establish care. When you choose a [facility] primary care physician or advanced practice provider, you gain a long-term partner in health. Find a Primary Care Provider  Learn more about [facility]'s in-office and virtual care options: [facility] - Get Care Now

## 2022-02-26 NOTE — Progress Notes (Signed)
Virtual Visit Consent   Stephanie [name], you are scheduled for a virtual visit with a [facility] provider today. Just as with appointments in the office, your consent must be obtained to participate. Your consent will be active for this visit and any virtual visit you may have with one of our providers in the next 365 days. If you have a MyChart account, a copy of this consent can be sent to you electronically.  As this is a virtual visit, video technology does not allow for your provider to perform a traditional examination. This may limit your provider's ability to fully assess your condition. If your provider identifies any concerns that need to be evaluated in person or the need to arrange testing (such as labs, EKG, etc.), we will make arrangements to do so. Although advances in technology are sophisticated, we cannot ensure that it will always work on either your end or our end. If the connection with a video visit is poor, the visit may have to be switched to a telephone visit. With either a video or telephone visit, we are not always able to ensure that we have a secure connection.  By engaging in this virtual visit, you consent to the provision of healthcare and authorize for your insurance to be billed (if applicable) for the services provided during this visit. Depending on your insurance coverage, you may receive a charge related to this service.  I need to obtain your verbal consent now. Are you willing to proceed with your visit today? Stephanie [name] has provided verbal consent on 02/26/2022 for a virtual visit (video or telephone). [name], PA-C  Date: 02/26/2022 8:33 AM  Virtual Visit via Video Note   I, [name], connected with  Stephanie [name]  ([id number], Nov 11, 1980) on 02/26/22 at  8:30 AM EDT by a video-enabled telemedicine application and verified that I am speaking with the correct person using two identifiers.  Location: Patient: Virtual Visit  Location Patient: Home Provider: Virtual Visit Location Provider: Home Office   I discussed the limitations of evaluation and management by telemedicine and the availability of in person appointments. The patient expressed understanding and agreed to proceed.    History of Present Illness: Stephanie [name] is a 41 y.o. who identifies as a female who was assigned female at birth, and is being seen today for possible UTI.  HPI: Urinary Tract Infection  This is a new problem. The current episode started in the past 7 days (2-3 days). The problem occurs every urination. The problem has been gradually worsening. The quality of the pain is described as aching. The patient is experiencing no pain. There has been no fever. Associated symptoms include frequency, hesitancy, nausea (mild) and urgency. Pertinent negatives include no chills, discharge, flank pain, hematuria, possible pregnancy or vomiting. She has tried acetaminophen for the symptoms. The treatment provided no relief. Her past medical history is significant for recurrent UTIs. There is no history of catheterization, urinary stasis or a urological procedure.     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 02/26/2018 02/26/2018   External hemorrhoids status post hemorrhoidectomy x2, 02/26/2018 02/26/2018   Hypokalemia 02/25/2018   Hypomagnesemia 02/25/2018   Pelvic pain 02/21/2018   Schizophrenia (HCC)    Panic attacks    Hypertension    PTSD (post-traumatic stress disorder)  Constipation, chronic 02/20/2018   Sweaty palms 10/25/2017   Encounter to establish care 10/25/2017   Depression 10/25/2017   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  Resting comfortably at home.  Head is normocephalic, atraumatic.  No labored breathing.  Speech is clear and coherent with logical content.  Patient is alert and oriented at baseline.    Assessment and Plan: 1. Suspected UTI - sulfamethoxazole-trimethoprim (BACTRIM DS) 800-160 MG tablet; Take 1 tablet by mouth 2 (two) times daily.  Dispense: 10 tablet; Refill: 0  - Worsening symptoms.  - Will treat empirically with Bactrim - May use AZO for bladder spasms - Continue to push fluids.  - Seek in person evaluation for urine culture if symptoms do not improve or if they worsen.    Follow Up Instructions: I discussed the assessment and treatment plan with the patient. The patient was provided an opportunity to ask questions and all were answered. The patient agreed with the plan and demonstrated an understanding of the instructions.  A copy of instructions were sent to the patient via MyChart unless otherwise noted below.    The patient was advised to call back or seek an in-person evaluation if the symptoms worsen or if the condition fails to improve as anticipated.  Time:  I spent 8 minutes with the patient via telehealth technology discussing the above problems/concerns.    [name], PA-C

## 2022-03-18 ENCOUNTER — Encounter: Payer: Self-pay | Admitting: Emergency Medicine

## 2022-03-18 ENCOUNTER — Ambulatory Visit
Admission: EM | Admit: 2022-03-18 | Discharge: 2022-03-18 | Disposition: A | Discharge status: Home / Self Care | Payer: Medicaid Other | Attending: Physician Assistant | Admitting: Physician Assistant

## 2022-03-18 DIAGNOSIS — N939 Abnormal uterine and vaginal bleeding, unspecified: Secondary | ICD-10-CM

## 2022-03-18 DIAGNOSIS — M791 Myalgia, unspecified site: Secondary | ICD-10-CM

## 2022-03-18 LAB — POCT URINE PREGNANCY: Preg Test, Ur: NEGATIVE

## 2022-03-23 ENCOUNTER — Encounter (HOSPITAL_COMMUNITY): Payer: Self-pay

## 2022-03-26 ENCOUNTER — Encounter: Payer: Self-pay | Admitting: General Practice

## 2022-04-25 ENCOUNTER — Other Ambulatory Visit: Payer: Self-pay

## 2022-04-25 ENCOUNTER — Ambulatory Visit (INDEPENDENT_AMBULATORY_CARE_PROVIDER_SITE_OTHER): Payer: Medicaid Other | Admitting: Student

## 2022-04-25 ENCOUNTER — Encounter: Payer: Self-pay | Admitting: Student

## 2022-04-25 VITALS — BP 137/94 | HR 78 | Temp 98.0°F | Resp 20 | Ht 67.0 in | Wt 159.2 lb

## 2022-04-25 DIAGNOSIS — G8929 Other chronic pain: Secondary | ICD-10-CM | POA: Diagnosis not present

## 2022-04-25 DIAGNOSIS — F17210 Nicotine dependence, cigarettes, uncomplicated: Secondary | ICD-10-CM

## 2022-04-25 DIAGNOSIS — I10 Essential (primary) hypertension: Secondary | ICD-10-CM | POA: Diagnosis not present

## 2022-04-25 DIAGNOSIS — M549 Dorsalgia, unspecified: Secondary | ICD-10-CM | POA: Diagnosis present

## 2022-04-25 MED ORDER — LISINOPRIL 10 MG PO TABS: 10.0000 mg | ORAL_TABLET | Freq: Every day | ORAL | 11 refills | Status: AC

## 2022-04-25 NOTE — Assessment & Plan Note (Signed)
Has been experiencing back pain for the past month. It reminds her of the pain that she felt prior to her perirectal abscess surgery in 2019. She describes it as 5-6/10, very achy, between dull and sharp, sometimes burning, and located primarily in the lumbosacral and cervical regions. It has been constant and does not worsen or improve with activity, and it does not seem to be associated with stress. She has tried [facility] aspirin-caffeine powder, ibuprofen, Tylenol, aspirin, and Doan's pads none of which seem to help. Heating pads are helpful in alleviating the pain. She reports one episode of electrical sensation radiating into L arm. She has never tried PT for her back pain specifically. No evidence of cervical radiculopathy on exam. Tenderness is primarily midline. Pain worse with back flexion compared to extension. Differential includes DDD, spinal stenosis, fibromyalgia, and ankylosing spondylitis. Given age and lack of neurologic findings, ankylosing spondylitis is perhaps the most likely.  - Order ESR, CRP, and HLA-B27 to inform workup - Order radiograph of lumbar and sacral spine to evaluate for osseus pathology

## 2022-04-25 NOTE — Assessment & Plan Note (Signed)
Longstanding history of hypertension, treated with amlodipine but was discontinued in 10-2021. BP today in clinic was 137/94.  - Start lisinopril 10mg  for better blood pressure control

## 2022-04-25 NOTE — Patient Instructions (Signed)
  Thank you, Ms.Aris Lot for allowing [place] to provide your care today. Today we discussed . . .  - importance of good blood pressure control and we have prescribed you a new medication called lisinopril - chronic upper and lower back pain for which we have ordered an Xray as the next step - identifying the cause of your back pain may require some more tests   I have ordered the following labs for you:   Lab Orders         Sed Rate (ESR)         CRP (C-Reactive Protein)         HLA-B27 Antigen       Tests ordered today:  Xray of lumbar and sacral spine   Referrals ordered today:   Referral Orders  No referral(s) requested today      I have ordered the following medication/changed the following medications:   Stop the following medications: There are no discontinued medications.   Start the following medications: No orders of the defined types were placed in this encounter.     Follow up: 3 months    Remember: Please take your blood pressure medication (lisinopril) daily. Also, remember to have that Xray completed before your next visit so that we can identify the cause of your back pain.   Should you have any questions or concerns please call the internal medicine clinic at [phone].     Roswell Nickel, MD [facility]

## 2022-04-25 NOTE — Progress Notes (Signed)
   CC: Back Pain  HPI:   [name] is a 41 y.o. female with a past medical history of hypertension, schizophrenia, PTSD, and depression who presents to establish care and for evaluation of back pain.     Past Medical History:  Diagnosis Date   Anemia    Depression    Gestational diabetes mellitus    gestational only   H/O cesarean section 2010   Headache    migraines   Hemorrhoids    Hypertension    Panic attacks    PTSD (post-traumatic stress disorder)    Schizophrenia ([facility])    Vaginal delivery 2004 , 2008, 2009     Review of Systems:    Reports nausea, constipation Denies fever, chills, cough, SOB, diarrhea, numbness   Physical Exam:  Vitals:   04/25/22 1515 04/25/22 1516  BP:  (!) 137/94  Pulse:  78  Resp:  20  Temp:  98 F (36.7 C)  TempSrc:  Oral  SpO2:  100%  Weight: 159 lb 3.2 oz (72.2 kg) 159 lb 3.2 oz (72.2 kg)  Height:  _0  (1.702 m)    General:   awake and alert, sitting comfortably in chair, cooperative, not in acute distress Lungs:   normal respiratory effort, breathing unlabored, symmetrical chest rise, no crackles or wheezing Cardiac:   regular rate and rhythm, normal S1 and S2 Musculoskeletal:   tenderness to palpation present in midline spine and paraspinal muscles worse in lumbosacral and cervical regions, exacerbated by flexion and not by extension, Spurling maneuver negative bilaterally, grip strength 5/5 bilaterally Neurologic:   oriented to person-place-time, sensation to light touch intact in both upper extremities Psychiatric:   mood and affect normal, intelligible speech, occasional confrontational undertone to voice    Assessment & Plan:   Hypertension Longstanding history of hypertension, treated with amlodipine but was discontinued in 10-2021. BP today in clinic was 137/94.  - Start lisinopril 35m for better blood pressure control   Chronic mid back pain Has been experiencing back pain for the past month. It  reminds her of the pain that she felt prior to her perirectal abscess surgery in 2019. She describes it as 5-6/10, very achy, between dull and sharp, sometimes burning, and located primarily in the lumbosacral and cervical regions. It has been constant and does not worsen or improve with activity, and it does not seem to be associated with stress. [facility], ibuprofen, Tylenol, aspirin, and Doan's pads none of which seem to help. Heating pads are helpful in alleviating the pain. She reports one episode of electrical sensation radiating into L arm. She has never tried PT for her back pain specifically. No evidence of cervical radiculopathy on exam. Tenderness is primarily midline. Pain worse with back flexion compared to extension. Differential includes DDD, spinal stenosis, fibromyalgia, and ankylosing spondylitis. Given age and lack of neurologic findings, ankylosing spondylitis is perhaps the most likely.  - Order ESR, CRP, and HLA-B27 to inform workup - Order radiograph of lumbar and sacral spine to evaluate for osseus pathology      See Encounters Tab for problem based charting.  Patient seen with Dr. HHeber Lomita

## 2022-04-26 NOTE — Progress Notes (Signed)
Internal Medicine Clinic Attending  I saw and evaluated the patient.  I personally confirmed the key portions of the history and exam documented by the resident  and I reviewed pertinent patient test results.  The assessment, diagnosis, and plan were formulated together and I agree with the documentation in the resident's note. Patient presented for evaluation of back pain I do think her psychiatric comorbidities makes this evaluation somewhat difficult as she seems to be fixated on her previous rectal surgery as a potential cause of multilevel back pain and interested in "nerve studies."  We discussed with her an approach to systematically evaluate her back pain appears to be most prominent in the lumbar area given her young age at onset.  We will start with x-rays especially if imaging the SI joint to rule out inflammatory arthritis.

## 2022-05-01 LAB — HLA-B27 ANTIGEN: HLA B27: NEGATIVE

## 2022-05-01 LAB — SEDIMENTATION RATE: Sed Rate: 10 mm/hr (ref 0–32)

## 2022-05-01 LAB — C-REACTIVE PROTEIN: CRP: <1 mg/L (ref 0–10)

## 2022-05-17 ENCOUNTER — Ambulatory Visit: Payer: Self-pay

## 2022-05-29 ENCOUNTER — Telehealth: Payer: Medicaid Other | Admitting: Physician Assistant

## 2022-05-29 DIAGNOSIS — R3989 Other symptoms and signs involving the genitourinary system: Secondary | ICD-10-CM | POA: Diagnosis not present

## 2022-05-29 MED ORDER — CEPHALEXIN 500 MG PO CAPS: 500.0000 mg | ORAL_CAPSULE | Freq: Two times a day (BID) | ORAL | 0 refills | Status: AC

## 2022-05-29 NOTE — Progress Notes (Addendum)
Patient clarified her urine is more of a canary yellow and not brown/dark red. She notes no option for color of her urine she could put in. Only mild low back pain.   E-Visit for Urinary Problems  We are sorry that you are not feeling well.  Here is how we plan to help!  Based on what you shared with me it looks like you most likely have a simple urinary tract infection.  A UTI (Urinary Tract Infection) is a bacterial infection of the bladder.  Most cases of urinary tract infections are simple to treat but a key part of your care is to encourage you to drink plenty of fluids and watch your symptoms carefully.  I have prescribed Keflex 500 mg twice a day for 7 days.  Your symptoms should gradually improve. Call us if the burning in your urine worsens, you develop worsening fever, back pain or pelvic pain or if your symptoms do not resolve after completing the antibiotic.  Urinary tract infections can be prevented by drinking plenty of water to keep your body hydrated.  Also be sure when you wipe, wipe from front to back and don't hold it in!  If possible, empty your bladder every 4 hours.  HOME CARE Drink plenty of fluids Compete the full course of the antibiotics even if the symptoms resolve Remember, when you need to go.go. Holding in your urine can increase the likelihood of getting a UTI! GET HELP RIGHT AWAY IF: You cannot urinate You get a high fever Worsening back pain occurs You see blood in your urine You feel sick to your stomach or throw up You feel like you are going to pass out  MAKE SURE YOU  Understand these instructions. Will watch your condition. Will get help right away if you are not doing well or get worse.   Thank you for choosing an e-visit.  Your e-visit answers were reviewed by a board certified advanced clinical practitioner to complete your personal care plan. Depending upon the condition, your plan could have included both over the counter or prescription  medications.  Please review your pharmacy choice. Make sure the pharmacy is open so you can pick up prescription now. If there is a problem, you may contact your provider through [employer] and have the prescription routed to another pharmacy.  Your safety is important to [place]. If you have drug allergies check your prescription carefully.   For the next 24 hours you can use MyChart to ask questions about today's visit, request a non-urgent call back, or ask for a work or school excuse. You will get an email in the next two days asking about your experience. I hope that your e-visit has been valuable and will speed your recovery.

## 2022-05-29 NOTE — Addendum Note (Signed)
Addended by: Waldon Merl on: 05/29/2022 07:13 PM   Modules accepted: Orders

## 2022-05-29 NOTE — Progress Notes (Signed)
I have spent 5 minutes in review of e-visit questionnaire, review and updating patient chart, medical decision making and response to patient.   Denni France Cody Lazlo Tunney, PA-C    

## 2022-06-09 ENCOUNTER — Telehealth: Payer: Medicaid Other | Admitting: Physician Assistant

## 2022-06-09 DIAGNOSIS — R3989 Other symptoms and signs involving the genitourinary system: Secondary | ICD-10-CM

## 2022-06-09 MED ORDER — CEPHALEXIN 500 MG PO CAPS: 500.0000 mg | ORAL_CAPSULE | Freq: Two times a day (BID) | ORAL | 0 refills | Status: DC

## 2022-06-09 NOTE — Patient Instructions (Signed)
Stephanie [name], thank you for joining Mar Daring, PA-C for today's virtual visit.  While this provider is not your primary care provider (PCP), if your PCP is located in our provider database this encounter information will be shared with them immediately following your visit.  Consent: (Patient) Stephanie [name] provided verbal consent for this virtual visit at the beginning of the encounter.  Current Medications:  Current Outpatient Medications:    cephALEXin (KEFLEX) 500 MG capsule, Take 1 capsule (500 mg total) by mouth 2 (two) times daily., Disp: 14 capsule, Rfl: 0   lisinopril (ZESTRIL) 10 MG tablet, Take 1 tablet (10 mg total) by mouth daily., Disp: 30 tablet, Rfl: 11   Medications ordered in this encounter:  Meds ordered this encounter  Medications   cephALEXin (KEFLEX) 500 MG capsule    Sig: Take 1 capsule (500 mg total) by mouth 2 (two) times daily.    Dispense:  14 capsule    Refill:  0    Order Specific Question:   Supervising Provider    Answer:   Chase Picket [id number]     *If you need refills on other medications prior to your next appointment, please contact your pharmacy*  Follow-Up: Call back or seek an in-person evaluation if the symptoms worsen or if the condition fails to improve as anticipated.  Other Instructions Urinary Tract Infection, Adult A urinary tract infection (UTI) is an infection of any part of the urinary tract. The urinary tract includes: The kidneys. The ureters. The bladder. The urethra. These organs make, store, and get rid of pee (urine) in the body. What are the causes? This infection is caused by germs (bacteria) in your genital area. These germs grow and cause swelling (inflammation) of your urinary tract. What increases the risk? The following factors may make you more likely to develop this condition: Using a small, thin tube (catheter) to drain pee. Not being able to control when you pee or poop  (incontinence). Being female. If you are female, these things can increase the risk: Using these methods to prevent pregnancy: A medicine that kills sperm (spermicide). A device that blocks sperm (diaphragm). Having low levels of a female hormone (estrogen). Being pregnant. You are more likely to develop this condition if: You have genes that add to your risk. You are sexually active. You take antibiotic medicines. You have trouble peeing because of: A prostate that is bigger than normal, if you are female. A blockage in the part of your body that drains pee from the bladder. A kidney stone. A nerve condition that affects your bladder. Not getting enough to drink. Not peeing often enough. You have other conditions, such as: Diabetes. A weak disease-fighting system (immune system). Sickle cell disease. Gout. Injury of the spine. What are the signs or symptoms? Symptoms of this condition include: Needing to pee right away. Peeing small amounts often. Pain or burning when peeing. Blood in the pee. Pee that smells bad or not like normal. Trouble peeing. Pee that is cloudy. Fluid coming from the vagina, if you are female. Pain in the belly or lower back. Other symptoms include: Vomiting. Not feeling hungry. Feeling mixed up (confused). This may be the first symptom in older adults. Being tired and grouchy (irritable). A fever. Watery poop (diarrhea). How is this treated? Taking antibiotic medicine. Taking other medicines. Drinking enough water. In some cases, you may need to see a specialist. Follow these instructions at home:  Medicines Take over-the-counter and prescription  medicines only as told by your doctor. If you were prescribed an antibiotic medicine, take it as told by your doctor. Do not stop taking it even if you start to feel better. General instructions Make sure you: Pee until your bladder is empty. Do not hold pee for a long time. Empty your bladder  after sex. Wipe from front to back after peeing or pooping if you are a female. Use each tissue one time when you wipe. Drink enough fluid to keep your pee pale yellow. Keep all follow-up visits. Contact a doctor if: You do not get better after 1-2 days. Your symptoms go away and then come back. Get help right away if: You have very bad back pain. You have very bad pain in your lower belly. You have a fever. You have chills. You feeling like you will vomit or you vomit. Summary A urinary tract infection (UTI) is an infection of any part of the urinary tract. This condition is caused by germs in your genital area. There are many risk factors for a UTI. Treatment includes antibiotic medicines. Drink enough fluid to keep your pee pale yellow. This information is not intended to replace advice given to you by your health care provider. Make sure you discuss any questions you have with your health care provider. Document Revised: 04/22/2020 Document Reviewed: 04/22/2020 Elsevier Patient Education  2023 Elsevier Inc.    If you have been instructed to have an in-person evaluation today at a local Urgent Care facility, please use the link below. It will take you to a list of all of our available [facility], including address, phone number and hours of operation. Please do not delay care.  [facility]  If you or a family member do not have a primary care provider, use the link below to schedule a visit and establish care. When you choose a [facility] primary care physician or advanced practice provider, you gain a long-term partner in health. Find a Primary Care Provider  Learn more about [facility]'s in-office and virtual care options: [facility] - Get Care Now

## 2022-06-09 NOTE — Progress Notes (Signed)
Virtual Visit Consent   Stephanie [name], you are scheduled for a virtual visit with a [facility] provider today. Just as with appointments in the office, your consent must be obtained to participate. Your consent will be active for this visit and any virtual visit you may have with one of our providers in the next 365 days. If you have a MyChart account, a copy of this consent can be sent to you electronically.  As this is a virtual visit, video technology does not allow for your provider to perform a traditional examination. This may limit your provider's ability to fully assess your condition. If your provider identifies any concerns that need to be evaluated in person or the need to arrange testing (such as labs, EKG, etc.), we will make arrangements to do so. Although advances in technology are sophisticated, we cannot ensure that it will always work on either your end or our end. If the connection with a video visit is poor, the visit may have to be switched to a telephone visit. With either a video or telephone visit, we are not always able to ensure that we have a secure connection.  By engaging in this virtual visit, you consent to the provision of healthcare and authorize for your insurance to be billed (if applicable) for the services provided during this visit. Depending on your insurance coverage, you may receive a charge related to this service.  I need to obtain your verbal consent now. Are you willing to proceed with your visit today? Stephanie [name] has provided verbal consent on 06/09/2022 for a virtual visit (video or telephone). [name], PA-C  Date: 06/09/2022 4:49 PM  Virtual Visit via Video Note   I, [name], connected with  Stephanie [name]  ([id number], 1981-08-02) on 06/09/22 at  4:45 PM EDT by a video-enabled telemedicine application and verified that I am speaking with the correct person using two identifiers.  Location: Patient: Virtual Visit  Location Patient: Home Provider: Virtual Visit Location Provider: Home Office   I discussed the limitations of evaluation and management by telemedicine and the availability of in person appointments. The patient expressed understanding and agreed to proceed.    History of Present Illness: Stephanie [name] is a 41 y.o. who identifies as a female who was assigned female at birth, and is being seen today for possible UTI. She completed an E-visit on 05/29/22 and Keflex was prescribed for a UTI. She reports she did not have money for the co-pay of the medication and it was re-shelved by the pharmacy. Since it has been over the 72 hours since she was last treated she is reaching back out with the same symptoms to have the prescription sent in again.   She is continuing to have urine frequency, lower abdominal pressure, low back pain, urine is bright yellow and has a foul odor. She denies fevers, chills, nausea, vomiting, and flank pain.    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 02/26/2018 02/26/2018   External hemorrhoids status post hemorrhoidectomy x2, 02/26/2018 02/26/2018   Hypokalemia 02/25/2018   Hypomagnesemia 02/25/2018   Pelvic pain 02/21/2018   Schizophrenia (HCC)    Panic attacks    Hypertension    PTSD (post-traumatic stress disorder)    Constipation, chronic 02/20/2018  Sweaty palms 10/25/2017   Encounter to establish care 10/25/2017   Depression 10/25/2017   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  Resting comfortably at home.  Head is normocephalic, atraumatic.  No labored breathing.  Speech is clear and coherent with logical content.  Patient is alert and oriented at baseline.    Assessment and Plan: 1. Suspected UTI - cephALEXin (KEFLEX) 500 MG capsule; Take 1 capsule (500 mg total) by mouth 2 (two) times daily.  Dispense: 14 capsule; Refill: 0  - Worsening symptoms.  - Will treat empirically with Keflex - May use AZO for bladder spasms - Continue to push fluids.  - Seek in person evaluation for urine culture if symptoms do not improve or if they worsen.    Follow Up Instructions: I discussed the assessment and treatment plan with the patient. The patient was provided an opportunity to ask questions and all were answered. The patient agreed with the plan and demonstrated an understanding of the instructions.  A copy of instructions were sent to the patient via MyChart unless otherwise noted below.    The patient was advised to call back or seek an in-person evaluation if the symptoms worsen or if the condition fails to improve as anticipated.  Time:  I spent 8 minutes with the patient via telehealth technology discussing the above problems/concerns.    Mar Daring, PA-C

## 2022-06-27 ENCOUNTER — Telehealth: Payer: Self-pay

## 2022-06-27 NOTE — Telephone Encounter (Signed)
Patient called she stated she has been taking her bp meds and is now having a headache. Please return patients call

## 2022-06-27 NOTE — Telephone Encounter (Signed)
Pt missed your call, she requesting for you to call her back

## 2022-06-27 NOTE — Telephone Encounter (Signed)
Return pt's call - no answer; left message return call per office and to call us back if needed.

## 2022-06-28 NOTE — Telephone Encounter (Signed)
Called pt - no answer; left message of return call and to the office if needed.

## 2022-09-14 ENCOUNTER — Telehealth: Payer: Medicaid Other

## 2022-10-18 ENCOUNTER — Telehealth: Payer: Medicaid Other | Admitting: Urgent Care

## 2022-10-18 DIAGNOSIS — N3000 Acute cystitis without hematuria: Secondary | ICD-10-CM

## 2022-10-18 MED ORDER — PHENAZOPYRIDINE HCL 100 MG PO TABS: 100.0000 mg | ORAL_TABLET | Freq: Three times a day (TID) | ORAL | 0 refills | Status: DC | PRN

## 2022-10-18 MED ORDER — NITROFURANTOIN MONOHYD MACRO 100 MG PO CAPS: 100.0000 mg | ORAL_CAPSULE | Freq: Two times a day (BID) | ORAL | 0 refills | Status: AC

## 2022-10-18 NOTE — Patient Instructions (Signed)
Aris Lot, thank you for joining Chaney Malling, PA for today's virtual visit.  While this provider is not your primary care provider (PCP), if your PCP is located in our provider database this encounter information will be shared with them immediately following your visit.   [facility] account gives you access to today's visit and all your visits, tests, and labs performed at [facility] " click here if you don't have a [facility] account or go to [email or web address]  Consent: (Patient) Stephanie [name] provided verbal consent for this virtual visit at the beginning of the encounter.  Current Medications:  Current Outpatient Medications:    nitrofurantoin, macrocrystal-monohydrate, (MACROBID) 100 MG capsule, Take 1 capsule (100 mg total) by mouth 2 (two) times daily for 5 days., Disp: 10 capsule, Rfl: 0   phenazopyridine (PYRIDIUM) 100 MG tablet, Take 1 tablet (100 mg total) by mouth 3 (three) times daily as needed for pain., Disp: 6 tablet, Rfl: 0   cephALEXin (KEFLEX) 500 MG capsule, Take 1 capsule (500 mg total) by mouth 2 (two) times daily., Disp: 14 capsule, Rfl: 0   lisinopril (ZESTRIL) 10 MG tablet, Take 1 tablet (10 mg total) by mouth daily., Disp: 30 tablet, Rfl: 11   Medications ordered in this encounter:  Meds ordered this encounter  Medications   nitrofurantoin, macrocrystal-monohydrate, (MACROBID) 100 MG capsule    Sig: Take 1 capsule (100 mg total) by mouth 2 (two) times daily for 5 days.    Dispense:  10 capsule    Refill:  0    Order Specific Question:   Supervising Provider    Answer:   Chase Picket [[id number]]   phenazopyridine (PYRIDIUM) 100 MG tablet    Sig: Take 1 tablet (100 mg total) by mouth 3 (three) times daily as needed for pain.    Dispense:  6 tablet    Refill:  0    Order Specific Question:   Supervising Provider    Answer:   Chase Picket [id number]     *If you need refills on other medications prior  to your next appointment, please contact your pharmacy*  Follow-Up: Call back or seek an in-person evaluation if the symptoms worsen or if the condition fails to improve as anticipated.  [facility] [phone]  Other Instructions Your symptoms are consistent with a urinary tract infection. Start taking the antibiotic twice daily until completed. You can take Pyridium up to 3 times daily for maximum of 2 days, this is only to help with the frequency and the pain.  Take on an as-needed basis. It will turn your pee dark orange. Monitor for any change in or worsening symptoms; fever, hematuria (blood in urine) or flank pain would warrant an in person evaluation.  If you have been instructed to have an in-person evaluation today at a local Urgent Care facility, please use the link below. It will take you to a list of all of our available [facility], including address, phone number and hours of operation. Please do not delay care.  [facility]  If you or a family member do not have a primary care provider, use the link below to schedule a visit and establish care. When you choose a [facility] primary care physician or advanced practice provider, you gain a long-term partner in health. Find a Primary Care Provider  Learn more about [facility]'s in-office and virtual care options: [facility] -  Get Care Now

## 2022-10-18 NOTE — Progress Notes (Signed)
Virtual Visit Consent   Stephanie [name], you are scheduled for a virtual visit with a [facility] provider today. Just as with appointments in the office, your consent must be obtained to participate. Your consent will be active for this visit and any virtual visit you may have with one of our providers in the next 365 days. If you have a MyChart account, a copy of this consent can be sent to you electronically.  As this is a virtual visit, video technology does not allow for your provider to perform a traditional examination. This may limit your provider's ability to fully assess your condition. If your provider identifies any concerns that need to be evaluated in person or the need to arrange testing (such as labs, EKG, etc.), we will make arrangements to do so. Although advances in technology are sophisticated, we cannot ensure that it will always work on either your end or our end. If the connection with a video visit is poor, the visit may have to be switched to a telephone visit. With either a video or telephone visit, we are not always able to ensure that we have a secure connection.  By engaging in this virtual visit, you consent to the provision of healthcare and authorize for your insurance to be billed (if applicable) for the services provided during this visit. Depending on your insurance coverage, you may receive a charge related to this service.  I need to obtain your verbal consent now. Are you willing to proceed with your visit today? Stephanie [name] has provided verbal consent on 10/18/2022 for a virtual visit (video or telephone). Chaney Malling, PA  Date: 10/18/2022 4:46 PM  Virtual Visit via Video Note   I, Stephanie [name], connected with  Stephanie [name]  ([id number], Aug 11, 1981) on 10/18/22 at  4:30 PM EST by a video-enabled telemedicine application and verified that I am speaking with the correct person using two identifiers.  Location: Patient: Virtual Visit Location  Patient: Home Provider: Virtual Visit Location Provider: Home Office   I discussed the limitations of evaluation and management by telemedicine and the availability of in person appointments. The patient expressed understanding and agreed to proceed.    History of Present Illness: Stephanie [name] is a 42 y.o. who identifies as a female who was assigned female at birth, and is being seen today for urinary frequency.  HPI: 42yo female presents today due to possible UTI. Reports a hx of recurrent UTIs and states in the past she has always had urinary frequency and an abnormal urine color. Pt reports her symptoms today have been present for one week, and are the same as all previous UTIs. She reports lower back pain midline, but denies flank pain, fever or hematuria. Last UTI she states was 3 months ago. Denies pelvic pain, vaginal discharge or any additional sx. No fever.    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 02/26/2018 02/26/2018   External hemorrhoids status post hemorrhoidectomy x2, 02/26/2018 02/26/2018   Hypokalemia 02/25/2018   Hypomagnesemia 02/25/2018   Pelvic pain 02/21/2018   Schizophrenia ([facility])    Panic attacks    Hypertension    PTSD (post-traumatic stress disorder)    Constipation, chronic 02/20/2018   Sweaty palms 10/25/2017   Encounter to establish care 10/25/2017  Depression 10/25/2017   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   Prolapsed internal hemorrhoids, grade 2&3, s/p hemorrhoidal ligation/pexy 02/26/2018 05/01/2013    Allergies: Not on File Medications:  Current Outpatient Medications:    nitrofurantoin, macrocrystal-monohydrate, (MACROBID) 100 MG capsule, Take 1 capsule (100 mg total) by mouth 2 (two) times daily for 5 days., Disp: 10 capsule,  Rfl: 0   phenazopyridine (PYRIDIUM) 100 MG tablet, Take 1 tablet (100 mg total) by mouth 3 (three) times daily as needed for pain., Disp: 6 tablet, Rfl: 0   cephALEXin (KEFLEX) 500 MG capsule, Take 1 capsule (500 mg total) by mouth 2 (two) times daily., Disp: 14 capsule, Rfl: 0   lisinopril (ZESTRIL) 10 MG tablet, Take 1 tablet (10 mg total) by mouth daily., Disp: 30 tablet, Rfl: 11  Observations/Objective: Patient is well-developed, well-nourished in no acute distress.  Resting comfortably at home.  Head is normocephalic, atraumatic.  No labored breathing.  Speech is clear and coherent with logical content.  Patient is alert and oriented at baseline.    Assessment and Plan: 1. Acute cystitis without hematuria  Pt with hx of recurrent cystitis, sx consistent with prior infections. Pt denies red flag s/sx therefore will cover with macrobid and pyridium. Increase water intake. Head to in person UC if sx persist despite tx  Follow Up Instructions: I discussed the assessment and treatment plan with the patient. The patient was provided an opportunity to ask questions and all were answered. The patient agreed with the plan and demonstrated an understanding of the instructions.  A copy of instructions were sent to the patient via MyChart unless otherwise noted below.    The patient was advised to call back or seek an in-person evaluation if the symptoms worsen or if the condition fails to improve as anticipated.  Time:  I spent 6 minutes with the patient via telehealth technology discussing the above problems/concerns.    [facility] [facility], PA

## 2022-11-29 ENCOUNTER — Emergency Department (HOSPITAL_BASED_OUTPATIENT_CLINIC_OR_DEPARTMENT_OTHER): Payer: Medicaid Other

## 2022-11-29 ENCOUNTER — Emergency Department (HOSPITAL_COMMUNITY)
Admission: EM | Admit: 2022-11-29 | Discharge: 2022-11-29 | Disposition: A | Discharge status: Home / Self Care | Payer: Medicaid Other | Attending: Emergency Medicine | Admitting: Emergency Medicine

## 2022-11-29 ENCOUNTER — Encounter (HOSPITAL_COMMUNITY): Payer: Self-pay

## 2022-11-29 DIAGNOSIS — G8929 Other chronic pain: Secondary | ICD-10-CM | POA: Insufficient documentation

## 2022-11-29 DIAGNOSIS — M545 Low back pain, unspecified: Secondary | ICD-10-CM | POA: Diagnosis not present

## 2022-11-29 DIAGNOSIS — M25512 Pain in left shoulder: Secondary | ICD-10-CM | POA: Diagnosis present

## 2022-11-29 DIAGNOSIS — R52 Pain, unspecified: Secondary | ICD-10-CM

## 2022-11-29 DIAGNOSIS — M7989 Other specified soft tissue disorders: Secondary | ICD-10-CM | POA: Diagnosis not present

## 2022-11-29 DIAGNOSIS — R600 Localized edema: Secondary | ICD-10-CM | POA: Diagnosis not present

## 2022-11-29 DIAGNOSIS — I10 Essential (primary) hypertension: Secondary | ICD-10-CM | POA: Insufficient documentation

## 2022-11-29 MED ORDER — ACETAMINOPHEN 500 MG PO TABS
1000.0000 mg | ORAL_TABLET | Freq: Once | ORAL | Status: AC
  Administered 2022-11-29: 1000 mg via ORAL
  Filled 2022-11-29: qty 2

## 2022-11-29 MED ORDER — KETOROLAC TROMETHAMINE 15 MG/ML IJ SOLN
15.0000 mg | Freq: Once | INTRAMUSCULAR | Status: AC
  Administered 2022-11-29: 15 mg via INTRAMUSCULAR
  Filled 2022-11-29: qty 1

## 2022-11-29 NOTE — Discharge Instructions (Addendum)
Thank you for coming to [facility] Emergency Department. You were seen for leg swelling, shoulder and back pain. We did an exam, and imaging, and these showed  no DVT. You can alternate taking Tylenol and ibuprofen as needed for pain. You can take '650mg'$  tylenol (acetaminophen) every 4-6 hours, and 600 mg ibuprofen 3 times a day.  Please follow up with your primary care provider within 1 week. You can call health connect to establish with a primary care physician.  Return to the ED if you develop any of the following: - Fever (100.4 F or 38 C) or chills at home that do not respond to over the counter medications - Weakness, numbness, or tingling in your extremities - Difficulty emptying bladder / urinary incontinence - Fecal incontinence - Uncontrolled nausea/vomiting with inability to keep down liquids - Feeling as though you are going to pass out or passing out - Anything else that concerns you

## 2022-11-29 NOTE — Progress Notes (Signed)
LLE venous duplex has been completed.  Preliminary results given to Dr. Mayra Neer.   Results can be found under chart review under CV PROC. 11/29/2022 10:00 AM Amarachukwu Lakatos RVT, RDMS

## 2022-11-29 NOTE — ED Provider Notes (Signed)
[facility] Provider Note   CSN: [id number] Arrival date & time: 11/29/22  4270     History  Chief Complaint  Patient presents with   Leg Swelling   Shoulder Pain    SELIA WAREING is a 42 y.o. female with schizophrenia, HTN, PTSD, hypokalemia, hypomagnesemia, hemorrhoids, chronic back pain who presents with leg swelling, shoulder pain.   Patient reports sent from urgent care where she went yesterday for chronic left shoulder pain, chronic left back pain, as well as chronic left lower extremity swelling.  She reports that both her son as well as her mother have had DVTs in the past and that urgent care sent her for an ultrasound.  She endorses that her left lower extremity has been asymmetrically swollen greater than her right one for several months now, is not sure exactly when it started.  It is very mildly painful but not significantly painful.  There was no trauma to the area.  She has never had a DVT or PE before.  She denies any chest pain or shortness of breath, does not take a blood thinner.  Has not had any recent hospitalizations, surgeries, travel.  Does not take any birth control.  Patient also complains of left shoulder pain that has been ongoing for many months and diffuse lower back pain.  She has not had any trauma and there is no point tenderness that she notes to any of these areas.  She denies any fever/chills, history of IVDU.  She notes that the back pain concerned her because last year she had an intra-abdominal abscess that required surgery and placement of a drain however this pain does not feel similar to that.  She has no abdominal pain, nausea and diarrhea constipation, urinary symptoms.  Last bowel movement was this morning and it was normal for her.   Shoulder Pain      Home Medications Prior to Admission medications   Medication Sig Start Date End Date Taking? Authorizing Provider  cephALEXin (KEFLEX) 500 MG  capsule Take 1 capsule (500 mg total) by mouth 2 (two) times daily. 06/09/22   Mar Daring, PA-C  lisinopril (ZESTRIL) 10 MG tablet Take 1 tablet (10 mg total) by mouth daily. 04/25/22 04/25/23  Serita Butcher, MD  phenazopyridine (PYRIDIUM) 100 MG tablet Take 1 tablet (100 mg total) by mouth 3 (three) times daily as needed for pain. 10/18/22   Geryl Councilman L, PA      Allergies    Patient has no known allergies.    Review of Systems   Review of Systems Review of systems Negative for f/c.  A 10 point review of systems was performed and is negative unless otherwise reported in HPI.  Physical Exam Updated Vital Signs BP (!) 145/104 (BP Location: Right Arm)   Pulse 80   Temp 98.2 F (36.8 C) (Oral)   Resp 18   LMP 11/29/2022 (Approximate)   SpO2 100%  Physical Exam General: Normal appearing female, lying in bed.  HEENT: Sclera anicteric, MMM, trachea midline.  Cardiology: RRR, no murmurs/rubs/gallops. BL radial and DP pulses equal bilaterally.  Resp: Normal respiratory rate and effort. CTAB, no wheezes, rhonchi, crackles.  Abd: Soft, non-tender, non-distended. No rebound tenderness or guarding.  GU: Deferred. MSK: No signs of trauma or injury or deformity to the left upper extremity or shoulder.   No peripheral edema or signs of trauma. Extremities without deformity or TTP. Mild asymmetric swelling to left ankle, nonpitting,  no TTP and no erythema.  No cyanosis or clubbing. Skin: warm, dry. No rashes or lesions. Back: No CVA tenderness. No midline TTP or thoracic or lumbar spine, no stepoffs or deformities.  Neuro: A&Ox4, CNs II-XII grossly intact. MAEs. Sensation grossly intact.  Psych: Normal mood and affect.   ED Results / Procedures / Treatments   Labs (all labs ordered are listed, but only abnormal results are displayed) Labs Reviewed - No data to display  EKG None  Radiology VAS [place] LOWER EXTREMITY VENOUS (DVT) (7a-7p)  Result Date: 11/29/2022  Lower Venous DVT Study  Patient Name:  STEPHANA MORELL  Date of Exam:   11/29/2022 Medical Rec #: [id number]         Accession #:    [id number] Date of Birth: 11-29-1980         Patient Gender: F Patient Age:   62 years Exam Location:  [facility] Procedure:      VAS [place] LOWER EXTREMITY VENOUS (DVT) Referring Phys: Iveliz Garay --------------------------------------------------------------------------------  Indications: Pain, and Swelling.  Comparison Study: Previous exam on 12/30/11 was negative for DVT Performing Technologist: Rogelia Rohrer RVT, RDMS  Examination Guidelines: A complete evaluation includes B-mode imaging, spectral Doppler, color Doppler, and power Doppler as needed of all accessible portions of each vessel. Bilateral testing is considered an integral part of a complete examination. Limited examinations for reoccurring indications may be performed as noted. The reflux portion of the exam is performed with the patient in reverse Trendelenburg.  +-----+---------------+---------+-----------+----------+--------------+ RIGHTCompressibilityPhasicitySpontaneityPropertiesThrombus Aging +-----+---------------+---------+-----------+----------+--------------+ CFV  Full           Yes      Yes                                 +-----+---------------+---------+-----------+----------+--------------+   +---------+---------------+---------+-----------+----------+--------------+ LEFT     CompressibilityPhasicitySpontaneityPropertiesThrombus Aging +---------+---------------+---------+-----------+----------+--------------+ CFV      Full           Yes      Yes                                 +---------+---------------+---------+-----------+----------+--------------+ SFJ      Full                                                        +---------+---------------+---------+-----------+----------+--------------+ FV Prox  Full           Yes      Yes                                  +---------+---------------+---------+-----------+----------+--------------+ FV Mid   Full           Yes      Yes                                 +---------+---------------+---------+-----------+----------+--------------+ FV DistalFull           Yes      Yes                                 +---------+---------------+---------+-----------+----------+--------------+  PFV      Full                                                        +---------+---------------+---------+-----------+----------+--------------+ POP      Full           Yes      Yes                                 +---------+---------------+---------+-----------+----------+--------------+ PTV      Full                                                        +---------+---------------+---------+-----------+----------+--------------+ PERO     Full                                                        +---------+---------------+---------+-----------+----------+--------------+     Summary: RIGHT: - No evidence of common femoral vein obstruction.  LEFT: - There is no evidence of deep vein thrombosis in the lower extremity.  - No cystic structure found in the popliteal fossa.  *See table(s) above for measurements and observations.    Preliminary     Procedures Procedures    Medications Ordered in ED Medications  ketorolac (TORADOL) 15 MG/ML injection 15 mg (15 mg Intramuscular Given 11/29/22 0924)  acetaminophen (TYLENOL) tablet 1,000 mg (1,000 mg Oral Given 11/29/22 5176)    ED Course/ Medical Decision Making/ A&P                          Medical Decision Making Amount and/or Complexity of Data Reviewed Radiology:  Decision-making details documented in ED Course.  Risk OTC drugs. Prescription drug management.    This patient presents to the ED for concern of lower extremity swelling, chornic shoulder/back pain, this involves an extensive number of treatment options, and is a complaint that carries  with it a high risk of complications and morbidity. However patient is extremely well-appearing, HDS.   MDM:    Concern for DVT of the lower extremity given chronic swelling however patient's Bevelyn Buckles' sign is negative, she just has very mild swelling to the left ankle with no areas of point tenderness.  Given that she has a family history of DVT/PEs will assess with an ultrasound.  She has no chest pain or shortness of breath at this time to suggest a PE she is hemodynamically stable and not hypoxic or tachycardic.  No erythema/induration/fluctuance on the left lower extremity to suggest cellulitis.  She has intact peripheral pulses, no concern for arterial ischemia.  She has no bilateral edema to suggest renal injury, fluid overload, nephrotic syndrome.  For her chronic left shoulder and lower back pain, she has no areas of point tenderness on exam and no deformities to suggest acute fracture and there was also no trauma.  No swelling of the joint and no erythema to suggest  septic arthritis.  Intact neurovascularly, no swelling to suggest upper extremity DVT.  For her back she has no midline tenderness to palpation, no fever/chills, no history of IVDU or cancer, no back pain red flags and additionally no trauma, do not believe any imaging of the shoulder back is necessary at this time.  Will give patient Tylenol Toradol and reassess.   Clinical Course as of 11/29/22 1032  Thu Nov 29, 2022  0957 VAS [place] LOWER EXTREMITY VENOUS (DVT) (7a-7p) Summary: RIGHT: - No evidence of common femoral vein obstruction.   LEFT: - There is no evidence of deep vein thrombosis in the lower extremity.   - No cystic structure found in the popliteal fossa.   [HN]    Clinical Course User Index [HN] Audley Hose, MD   Imaging Studies ordered: I ordered imaging studies including DVT US I independently visualized and interpreted imaging. I agree with the radiologist interpretation  Additional history obtained  from chart review, UC papers at bedside.  Reevaluation: After the interventions noted above, I reevaluated the patient and found that they have :improved  Social Determinants of Health: Patient lives independently   Disposition: Patient states her left shoulder and lower back pain is completely resolved after the Tylenol/Toradol.  She has no evidence of DVT in the left lower extremity.  Patient is safe to follow-up outpatient for her chronic discomfort.  Instructed to take Tylenol ibuprofen for her pain and to follow-up with her primary care physician within 2 weeks.  DC w/ discharge instructions/return precautions. All questions answered to patient's satisfaction.    Co morbidities that complicate the patient evaluation  Past Medical History:  Diagnosis Date   Anemia    Depression    Gestational diabetes mellitus    gestational only   H/O cesarean section 2010   Headache    migraines   Hemorrhoids    Hypertension    Panic attacks    PTSD (post-traumatic stress disorder)    Schizophrenia ([place])    Vaginal delivery 2004 , 2008, 2009     Medicines Meds ordered this encounter  Medications   ketorolac (TORADOL) 15 MG/ML injection 15 mg   acetaminophen (TYLENOL) tablet 1,000 mg    I have reviewed the patients home medicines and have made adjustments as needed  Problem List / ED Course: Problem List Items Addressed This Visit   None Visit Diagnoses     Chronic left shoulder pain    -  Primary   Relevant Medications   ketorolac (TORADOL) 15 MG/ML injection 15 mg (Completed)   acetaminophen (TYLENOL) tablet 1,000 mg (Completed)   Edema of left lower extremity       Chronic bilateral low back pain without sciatica       Relevant Medications   ketorolac (TORADOL) 15 MG/ML injection 15 mg (Completed)   acetaminophen (TYLENOL) tablet 1,000 mg (Completed)                   This note was created using dictation software, which may contain spelling or grammatical  errors.    Audley Hose, MD 12/04/22 [phone]

## 2022-11-29 NOTE — ED Triage Notes (Signed)
Pt presents with c/o left shoulder and side pain that is chronic in nature. Pt also reports leg swelling that is chronic in nature. Pt reports she has peripheral edema.

## 2023-01-01 ENCOUNTER — Telehealth: Payer: Medicaid Other | Admitting: Family Medicine

## 2023-01-01 NOTE — Progress Notes (Signed)
The patient no-showed for appointment despite this provider sending direct link, reaching out via phone with no response and waiting for at least 10 minutes from appointment time for patient to join. They will be marked as a NS for this appointment/time.   Audris Speaker M Maci Eickholt, NP    

## 2023-04-06 ENCOUNTER — Telehealth: Payer: MEDICAID | Admitting: Family Medicine

## 2023-04-06 DIAGNOSIS — R3989 Other symptoms and signs involving the genitourinary system: Secondary | ICD-10-CM | POA: Diagnosis not present

## 2023-04-06 MED ORDER — CEPHALEXIN 500 MG PO CAPS: 500.0000 mg | ORAL_CAPSULE | Freq: Two times a day (BID) | ORAL | 0 refills | Status: DC

## 2023-04-06 NOTE — Progress Notes (Signed)
Virtual Visit Consent   Stephanie [name], you are scheduled for a virtual visit with a [facility] provider today. Just as with appointments in the office, your consent must be obtained to participate. Your consent will be active for this visit and any virtual visit you may have with one of our providers in the next 365 days. If you have a MyChart account, a copy of this consent can be sent to you electronically.  As this is a virtual visit, video technology does not allow for your provider to perform a traditional examination. This may limit your provider's ability to fully assess your condition. If your provider identifies any concerns that need to be evaluated in person or the need to arrange testing (such as labs, EKG, etc.), we will make arrangements to do so. Although advances in technology are sophisticated, we cannot ensure that it will always work on either your end or our end. If the connection with a video visit is poor, the visit may have to be switched to a telephone visit. With either a video or telephone visit, we are not always able to ensure that we have a secure connection.  By engaging in this virtual visit, you consent to the provision of healthcare and authorize for your insurance to be billed (if applicable) for the services provided during this visit. Depending on your insurance coverage, you may receive a charge related to this service.  I need to obtain your verbal consent now. Are you willing to proceed with your visit today? [facility] has provided verbal consent on 04/06/2023 for a virtual visit (video or telephone). [name], PA-C  Date: 04/06/2023 1:20 PM  Virtual Visit via Video Note   IReed Pandy, connected with  Stephanie [name]  ([id number], July 25, 1981) on 04/06/23 at  1:15 PM EDT by a video-enabled telemedicine application and verified that I am speaking with the correct person using two identifiers.  Location: Patient: Virtual Visit Location Patient:  Home Provider: Virtual Visit Location Provider: Home Office   I discussed the limitations of evaluation and management by telemedicine and the availability of in person appointments. The patient expressed understanding and agreed to proceed.    History of Present Illness: Stephanie [name] is a 42 y.o. who identifies as a female who was assigned female at birth, and is being seen today for Pt c/o frequent urination thinks she has an infection.  Pt also c/o lower back pain. Pt states symptoms started 2-3 days ago. Pt states had a little nausea but no fever. Pt denies any pregnancy currently.   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 02/26/2018 02/26/2018   External hemorrhoids status post hemorrhoidectomy x2, 02/26/2018 02/26/2018   Hypokalemia 02/25/2018   Hypomagnesemia 02/25/2018   Pelvic pain 02/21/2018   Schizophrenia (HCC)    Panic attacks    Hypertension    PTSD (post-traumatic stress disorder)    Constipation, chronic 02/20/2018   Sweaty palms 10/25/2017   Encounter to establish care 10/25/2017   Depression 10/25/2017   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   Prolapsed internal hemorrhoids, grade 2&3, s/p hemorrhoidal ligation/pexy 02/26/2018 05/01/2013    Allergies: No Known Allergies Medications:  Current Outpatient Medications:    cephALEXin (KEFLEX) 500 MG capsule, Take 1 capsule (500 mg total)  by mouth 2 (two) times daily., Disp: 14 capsule, Rfl: 0   lisinopril (ZESTRIL) 10 MG tablet, Take 1 tablet (10 mg total) by mouth daily., Disp: 30 tablet, Rfl: 11   phenazopyridine (PYRIDIUM) 100 MG tablet, Take 1 tablet (100 mg total) by mouth 3 (three) times daily as needed for pain., Disp: 6 tablet, Rfl: 0  Observations/Objective: Patient is well-developed,  well-nourished in no acute distress.  Resting comfortably at home.  Head is normocephalic, atraumatic.  No labored breathing.  Speech is clear and coherent with logical content.  Patient is alert and oriented at baseline.    Assessment and Plan: 1. Suspected UTI - cephALEXin (KEFLEX) 500 MG capsule; Take 1 capsule (500 mg total) by mouth 2 (two) times daily.  Dispense: 14 capsule; Refill: 0  -Advised Pt to follow up with PCP if symptoms persist or worsen.   Follow Up Instructions: I discussed the assessment and treatment plan with the patient. The patient was provided an opportunity to ask questions and all were answered. The patient agreed with the plan and demonstrated an understanding of the instructions.  A copy of instructions were sent to the patient via MyChart unless otherwise noted below.     The patient was advised to call back or seek an in-person evaluation if the symptoms worsen or if the condition fails to improve as anticipated.  Time:  I spent 15 minutes with the patient via telehealth technology discussing the above problems/concerns.    [name], PA-C

## 2023-04-13 ENCOUNTER — Telehealth: Payer: MEDICAID | Admitting: Family Medicine

## 2023-04-13 DIAGNOSIS — S9002XA Contusion of left ankle, initial encounter: Secondary | ICD-10-CM | POA: Diagnosis not present

## 2023-04-13 MED ORDER — NAPROXEN 500 MG PO TABS: 500.0000 mg | ORAL_TABLET | Freq: Two times a day (BID) | ORAL | 0 refills | Status: AC

## 2023-04-13 NOTE — Progress Notes (Signed)
Virtual Visit Consent   Stephanie [name], you are scheduled for a virtual visit with a [facility] provider today. Just as with appointments in the office, your consent must be obtained to participate. Your consent will be active for this visit and any virtual visit you may have with one of our providers in the next 365 days. If you have a MyChart account, a copy of this consent can be sent to you electronically.  As this is a virtual visit, video technology does not allow for your provider to perform a traditional examination. This may limit your provider's ability to fully assess your condition. If your provider identifies any concerns that need to be evaluated in person or the need to arrange testing (such as labs, EKG, etc.), we will make arrangements to do so. Although advances in technology are sophisticated, we cannot ensure that it will always work on either your end or our end. If the connection with a video visit is poor, the visit may have to be switched to a telephone visit. With either a video or telephone visit, we are not always able to ensure that we have a secure connection.  By engaging in this virtual visit, you consent to the provision of healthcare and authorize for your insurance to be billed (if applicable) for the services provided during this visit. Depending on your insurance coverage, you may receive a charge related to this service.  I need to obtain your verbal consent now. Are you willing to proceed with your visit today? Stephanie [name] has provided verbal consent on 04/13/2023 for a virtual visit (video or telephone). Stephanie Curio, Stephanie [name]  Date: 04/13/2023 12:34 PM  Virtual Visit via Video Note   I, Stephanie [name], connected with  Stephanie [name]  ([id number], 08-Dec-1980) on 04/13/23 at 12:30 PM EDT by a video-enabled telemedicine application and verified that I am speaking with the correct person using two identifiers.  Location: Patient: Virtual Visit Location Patient:  Home Provider: Virtual Visit Location Provider: Home Office   I discussed the limitations of evaluation and management by telemedicine and the availability of in person appointments. The patient expressed understanding and agreed to proceed.    History of Present Illness: Stephanie [name] is a 42 y.o. who identifies as a female who was assigned female at birth, and is being seen today for left lateral ankle pain with mild swelling. No redness or warmth She says she hit it on a pallet. 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 02/26/2018 02/26/2018   External hemorrhoids status post hemorrhoidectomy x2, 02/26/2018 02/26/2018   Hypokalemia 02/25/2018   Hypomagnesemia 02/25/2018   Pelvic pain 02/21/2018   Schizophrenia (HCC)    Panic attacks    Hypertension    PTSD (post-traumatic stress disorder)    Constipation, chronic 02/20/2018   Sweaty palms 10/25/2017   Encounter to establish care 10/25/2017   Depression 10/25/2017   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   Prolapsed internal hemorrhoids, grade 2&3, s/p hemorrhoidal ligation/pexy 02/26/2018 05/01/2013    Allergies: No Known Allergies Medications:  Current Outpatient Medications:    cephALEXin (KEFLEX) 500 MG capsule, Take 1 capsule (500 mg total) by mouth 2 (two) times daily., Disp: 14 capsule, Rfl: 0   lisinopril (ZESTRIL) 10 MG tablet, Take  1 tablet (10 mg total) by mouth daily., Disp: 30 tablet, Rfl: 11   phenazopyridine (PYRIDIUM) 100 MG tablet, Take 1 tablet (100 mg total) by mouth 3 (three) times daily as needed for pain., Disp: 6 tablet, Rfl: 0  Observations/Objective: Patient is well-developed, well-nourished in no acute distress.  Resting comfortably  at home.  Head is normocephalic, atraumatic.   No labored breathing.  Speech is clear and coherent with logical content.  Patient is alert and oriented at baseline.  Mild swelling lateral left ankle.   Assessment and Plan: 1. Contusion of left ankle, initial encounter  Elevate, ice, naprosyn sent, take with food, UC if sx persist or worsen.   Follow Up Instructions: I discussed the assessment and treatment plan with the patient. The patient was provided an opportunity to ask questions and all were answered. The patient agreed with the plan and demonstrated an understanding of the instructions.  A copy of instructions were sent to the patient via MyChart unless otherwise noted below.     The patient was advised to call back or seek an in-person evaluation if the symptoms worsen or if the condition fails to improve as anticipated.  Time:  I spent 10 minutes with the patient via telehealth technology discussing the above problems/concerns.    Stephanie Curio, Stephanie [name]

## 2023-04-13 NOTE — Patient Instructions (Signed)
Ankle Pain The ankle joint helps you stand on your leg and allows you to move around. Ankle pain can happen on either side or the back of the ankle. You may have pain in one ankle or both ankles. Ankle pain may be sharp and burning or dull and aching. There may be tenderness, stiffness, redness, or warmth around the ankle. Many things can cause ankle pain. These include an injury to the area and overuse of your ankle. Follow these instructions at home: Activity Rest your ankle as told by your health care provider. Avoid doing things that cause ankle pain. Do not use the injured limb to support your body weight until your provider says that you can. Use crutches as told by your provider. Ask your provider when it is safe to drive if you have a brace on your ankle. Do exercises as told by your provider. If you have a removable brace: Wear the brace as told by your provider. Remove it only as told by your provider. Check the skin around the brace every day. Tell your provider about any concerns. Loosen the brace if your toes tingle, become numb, or turn cold and blue. Keep the brace clean. If the brace is not waterproof: Do not let it get wet. Cover it with a watertight covering when you take a bath or shower. If you have an elastic bandage:  Remove it when you take a bath or a shower. Try not to move your ankle much. Wiggle your toes from time to time. This helps to prevent swelling. Adjust the bandage if it feels too tight. Loosen the bandage if your foot tingles, becomes numb, or turns cold and blue. Managing pain, stiffness, and swelling  If told, put ice on the painful area. If you have a removable brace or elastic bandage, remove it as told by your provider. Put ice in a plastic bag. Place a towel between your skin and the bag. Leave the ice on for 20 minutes, 2-3 times a day. If your skin turns bright red, remove the ice right away to prevent skin damage. The risk of damage is  higher if you cannot feel pain, heat, or cold. Move your toes often to reduce stiffness and swelling. Raise (elevate) your ankle above the level of your heart while you are sitting or lying down. General instructions Take over-the-counter and prescription medicines only as told by your provider. To help you and your provider, write down: How often you have ankle pain. Where the pain is. What the pain feels like. If you are told to wear a certain shoe or insole, make sure you wear it the right way and for as long as you are told. Contact a health care provider if: Your pain gets worse. Your pain does not get better with medicine. You have a fever or chills. You have more trouble walking. You have new symptoms. Your foot, leg, toes, or ankle tingles, becomes numb or swollen, or turns cold and blue. This information is not intended to replace advice given to you by your health care provider. Make sure you discuss any questions you have with your health care provider. Document Revised: 07/04/2022 Document Reviewed: 07/04/2022 Elsevier Patient Education  2024 [employer].

## 2023-05-19 ENCOUNTER — Telehealth: Payer: MEDICAID | Admitting: Family

## 2023-05-19 DIAGNOSIS — B9689 Other specified bacterial agents as the cause of diseases classified elsewhere: Secondary | ICD-10-CM | POA: Diagnosis not present

## 2023-05-19 DIAGNOSIS — N760 Acute vaginitis: Secondary | ICD-10-CM

## 2023-05-19 MED ORDER — METRONIDAZOLE 500 MG PO TABS
500.0000 mg | ORAL_TABLET | Freq: Two times a day (BID) | ORAL | 0 refills | Status: AC
Start: 2023-05-19 — End: ?

## 2023-05-19 NOTE — Patient Instructions (Signed)

## 2023-05-19 NOTE — Progress Notes (Signed)
Virtual Visit Consent   Stephanie [name], you are scheduled for a virtual visit with a [facility] provider today. Just as with appointments in the office, your consent must be obtained to participate. Your consent will be active for this visit and any virtual visit you may have with one of our providers in the next 365 days. If you have a MyChart account, a copy of this consent can be sent to you electronically.  As this is a virtual visit, video technology does not allow for your provider to perform a traditional examination. This may limit your provider's ability to fully assess your condition. If your provider identifies any concerns that need to be evaluated in person or the need to arrange testing (such as labs, EKG, etc.), we will make arrangements to do so. Although advances in technology are sophisticated, we cannot ensure that it will always work on either your end or our end. If the connection with a video visit is poor, the visit may have to be switched to a telephone visit. With either a video or telephone visit, we are not always able to ensure that we have a secure connection.  By engaging in this virtual visit, you consent to the provision of healthcare and authorize for your insurance to be billed (if applicable) for the services provided during this visit. Depending on your insurance coverage, you may receive a charge related to this service.  I need to obtain your verbal consent now. Are you willing to proceed with your visit today? Stephanie [name] has provided verbal consent on 05/19/2023 for a virtual visit (video or telephone). Jannifer Rodney, FNP  Date: 05/19/2023 3:40 PM  Virtual Visit via Video Note   I, Jannifer Rodney, connected with  Stephanie [name]  ([id number], 08-23-1981) on 05/19/23 at  3:30 PM EDT by a video-enabled telemedicine application and verified that I am speaking with the correct person using two identifiers.  Location: Patient: Virtual Visit Location Patient:  Home Provider: Virtual Visit Location Provider: Home Office   I discussed the limitations of evaluation and management by telemedicine and the availability of in person appointments. The patient expressed understanding and agreed to proceed.    History of Present Illness: Stephanie [name] is a 42 y.o. who identifies as a female who was assigned female at birth, and is being seen today for urinary frequency and vaginal discharge.  HPI: Vaginal Discharge The patient's primary symptoms include vaginal discharge. The patient's pertinent negatives include no genital itching or genital odor. This is a new problem. The pain is mild. Associated symptoms include dysuria and frequency. Pertinent negatives include no anorexia, back pain, chills, fever, flank pain, hematuria, nausea, painful intercourse, sore throat, urgency or vomiting. The vaginal discharge was grey and white. The treatment provided mild relief.    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 02/26/2018 02/26/2018   External hemorrhoids status post hemorrhoidectomy x2, 02/26/2018 02/26/2018   Hypokalemia 02/25/2018   Hypomagnesemia 02/25/2018   Pelvic pain 02/21/2018   Schizophrenia (HCC)    Panic attacks    Hypertension    PTSD (post-traumatic stress disorder)    Constipation, chronic 02/20/2018   Sweaty palms 10/25/2017   Encounter to establish care 10/25/2017   Depression 10/25/2017   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  Resting comfortably  at home.  Head is normocephalic, atraumatic.  No labored breathing.  Speech is clear and coherent with logical content.  Patient is alert and oriented at baseline.    Assessment and Plan: 1. BV (bacterial vaginosis) - metroNIDAZOLE (FLAGYL) 500 MG tablet; Take 1 tablet (500 mg total) by mouth 2 (two) times daily.  Dispense: 14 tablet; Refill: 0  Keep clean and dry Start flagyl  Probiotic as needed Follow up if symptoms worsen or do not improve   Follow Up Instructions: I discussed the assessment and treatment plan with the patient. The patient was provided an opportunity to ask questions and all were answered. The patient agreed with the plan and demonstrated an understanding of the instructions.  A copy of instructions were sent to the patient via MyChart unless otherwise noted below.     The patient was advised to call back or seek an in-person evaluation if the symptoms worsen or if the condition fails to improve as anticipated.  Time:  I spent 12 minutes with the patient via telehealth technology discussing the above problems/concerns.    Jannifer Rodney, FNP

## 2023-08-18 ENCOUNTER — Telehealth: Payer: MEDICAID | Admitting: Family Medicine

## 2023-08-18 DIAGNOSIS — N3000 Acute cystitis without hematuria: Secondary | ICD-10-CM

## 2023-08-18 DIAGNOSIS — B001 Herpesviral vesicular dermatitis: Secondary | ICD-10-CM | POA: Diagnosis not present

## 2023-08-18 MED ORDER — VALACYCLOVIR HCL 1 G PO TABS: 2000.0000 mg | ORAL_TABLET | Freq: Two times a day (BID) | ORAL | 0 refills | Status: AC

## 2023-08-18 MED ORDER — CEPHALEXIN 500 MG PO CAPS: 500.0000 mg | ORAL_CAPSULE | Freq: Two times a day (BID) | ORAL | 0 refills | Status: AC

## 2023-08-18 NOTE — Progress Notes (Signed)
Virtual Visit Consent   [name], you are scheduled for a virtual visit with a [facility] provider today. Just as with appointments in the office, your consent must be obtained to participate. Your consent will be active for this visit and any virtual visit you may have with one of our providers in the next 365 days. If you have a MyChart account, a copy of this consent can be sent to you electronically.  As this is a virtual visit, video technology does not allow for your provider to perform a traditional examination. This may limit your provider's ability to fully assess your condition. If your provider identifies any concerns that need to be evaluated in person or the need to arrange testing (such as labs, EKG, etc.), we will make arrangements to do so. Although advances in technology are sophisticated, we cannot ensure that it will always work on either your end or our end. If the connection with a video visit is poor, the visit may have to be switched to a telephone visit. With either a video or telephone visit, we are not always able to ensure that we have a secure connection.  By engaging in this virtual visit, you consent to the provision of healthcare and authorize for your insurance to be billed (if applicable) for the services provided during this visit. Depending on your insurance coverage, you may receive a charge related to this service.  I need to obtain your verbal consent now. Are you willing to proceed with your visit today? Stephanie [name] has provided verbal consent on 08/18/2023 for a virtual visit (video or telephone). Georgana Curio, FNP  Date: 08/18/2023 3:10 PM  Virtual Visit via Video Note   I, Georgana Curio, connected with  KARIN OHLENDORF  ([id number], 1981/06/09) on 08/18/23 at  3:00 PM EST by a video-enabled telemedicine application and verified that I am speaking with the correct person using two identifiers.  Location: Patient: Virtual Visit Location Patient:  Home Provider: Virtual Visit Location Provider: Home Office   I discussed the limitations of evaluation and management by telemedicine and the availability of in person appointments. The patient expressed understanding and agreed to proceed.    History of Present Illness: Stephanie [name] is a 42 y.o. who identifies as a female who was assigned female at birth, and is being seen today for fever blister under nose. Also complains of urinary frequency with no fever or back pain for 2 days. 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 02/26/2018 02/26/2018   External hemorrhoids status post hemorrhoidectomy x2, 02/26/2018 02/26/2018   Hypokalemia 02/25/2018   Hypomagnesemia 02/25/2018   Pelvic pain 02/21/2018   Schizophrenia (HCC)    Panic attacks    Hypertension    PTSD (post-traumatic stress disorder)    Constipation, chronic 02/20/2018   Sweaty palms 10/25/2017   Encounter to establish care 10/25/2017   Depression 10/25/2017   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  Resting comfortably  at home.  Head is normocephalic, atraumatic.  No labored breathing.  Speech is clear and coherent with logical content.  Patient is alert and oriented at baseline.    Assessment and  Plan: 1. Acute cystitis without hematuria  2. Fever blister  Keep area under nose clean and dry. Increase fluids, preventative measures discussed. UC if sx persist or worsen.   Follow Up Instructions: I discussed the assessment and treatment plan with the patient. The patient was provided an opportunity to ask questions and all were answered. The patient agreed with the plan and demonstrated an understanding of the instructions.  A copy of instructions were sent to the patient via MyChart unless otherwise noted below.     The patient was advised to call back or seek an in-person evaluation if the symptoms worsen or if the condition fails to improve as anticipated.    Georgana Curio, FNP

## 2023-08-18 NOTE — Patient Instructions (Signed)
Urinary Tract Infection, Adult  A urinary tract infection (UTI) is an infection of any part of the urinary tract. The urinary tract includes the kidneys, ureters, bladder, and urethra. These organs make, store, and get rid of urine in the body. An upper UTI affects the ureters and kidneys. A lower UTI affects the bladder and urethra. What are the causes? Most urinary tract infections are caused by bacteria in your genital area around your urethra, where urine leaves your body. These bacteria grow and cause inflammation of your urinary tract. What increases the risk? You are more likely to develop this condition if: You have a urinary catheter that stays in place. You are not able to control when you urinate or have a bowel movement (incontinence). You are female and you: Use a spermicide or diaphragm for birth control. Have low estrogen levels. Are pregnant. You have certain genes that increase your risk. You are sexually active. You take antibiotic medicines. You have a condition that causes your flow of urine to slow down, such as: An enlarged prostate, if you are female. Blockage in your urethra. A kidney stone. A nerve condition that affects your bladder control (neurogenic bladder). Not getting enough to drink, or not urinating often. You have certain medical conditions, such as: Diabetes. A weak disease-fighting system (immunesystem). Sickle cell disease. Gout. Spinal cord injury. What are the signs or symptoms? Symptoms of this condition include: Needing to urinate right away (urgency). Frequent urination. This may include small amounts of urine each time you urinate. Pain or burning with urination. Blood in the urine. Urine that smells bad or unusual. Trouble urinating. Cloudy urine. Vaginal discharge, if you are female. Pain in the abdomen or the lower back. You may also have: Vomiting or a decreased appetite. Confusion. Irritability or tiredness. A fever or  chills. Diarrhea. The first symptom in older adults may be confusion. In some cases, they may not have any symptoms until the infection has worsened. How is this diagnosed? This condition is diagnosed based on your medical history and a physical exam. You may also have other tests, including: Urine tests. Blood tests. Tests for STIs (sexually transmitted infections). If you have had more than one UTI, a cystoscopy or imaging studies may be done to determine the cause of the infections. How is this treated? Treatment for this condition includes: Antibiotic medicine. Over-the-counter medicines to treat discomfort. Drinking enough water to stay hydrated. If you have frequent infections or have other conditions such as a kidney stone, you may need to see a health care provider who specializes in the urinary tract (urologist). In rare cases, urinary tract infections can cause sepsis. Sepsis is a life-threatening condition that occurs when the body responds to an infection. Sepsis is treated in the hospital with IV antibiotics, fluids, and other medicines. Follow these instructions at home:  Medicines Take over-the-counter and prescription medicines only as told by your health care provider. If you were prescribed an antibiotic medicine, take it as told by your health care provider. Do not stop using the antibiotic even if you start to feel better. General instructions Make sure you: Empty your bladder often and completely. Do not hold urine for long periods of time. Empty your bladder after sex. Wipe from front to back after urinating or having a bowel movement if you are female. Use each tissue only one time when you wipe. Drink enough fluid to keep your urine pale yellow. Keep all follow-up visits. This is important. Contact a health   care provider if: Your symptoms do not get better after 1-2 days. Your symptoms go away and then return. Get help right away if: You have severe pain in  your back or your lower abdomen. You have a fever or chills. You have nausea or vomiting. Summary A urinary tract infection (UTI) is an infection of any part of the urinary tract, which includes the kidneys, ureters, bladder, and urethra. Most urinary tract infections are caused by bacteria in your genital area. Treatment for this condition often includes antibiotic medicines. If you were prescribed an antibiotic medicine, take it as told by your health care provider. Do not stop using the antibiotic even if you start to feel better. Keep all follow-up visits. This is important. This information is not intended to replace advice given to you by your health care provider. Make sure you discuss any questions you have with your health care provider. Document Revised: 04/17/2020 Document Reviewed: 04/22/2020 Elsevier Patient Education  2024 [employer]  

## 2023-09-04 ENCOUNTER — Emergency Department (HOSPITAL_COMMUNITY)
Admission: EM | Admit: 2023-09-04 | Discharge: 2023-09-04 | Disposition: A | Discharge status: Home / Self Care | Payer: MEDICAID | Attending: Emergency Medicine | Admitting: Emergency Medicine

## 2023-09-04 ENCOUNTER — Other Ambulatory Visit: Payer: Self-pay

## 2023-09-04 ENCOUNTER — Emergency Department (HOSPITAL_COMMUNITY): Payer: MEDICAID

## 2023-09-04 ENCOUNTER — Encounter (HOSPITAL_COMMUNITY): Payer: Self-pay

## 2023-09-04 DIAGNOSIS — R55 Syncope and collapse: Secondary | ICD-10-CM | POA: Diagnosis present

## 2023-09-04 DIAGNOSIS — I10 Essential (primary) hypertension: Secondary | ICD-10-CM | POA: Diagnosis not present

## 2023-09-04 DIAGNOSIS — Z79899 Other long term (current) drug therapy: Secondary | ICD-10-CM | POA: Insufficient documentation

## 2023-09-04 LAB — CBC
HCT: 37.3 % (ref 36.0–46.0)
Hemoglobin: 12.4 g/dL (ref 12.0–15.0)
MCH: 34.8 pg — ABNORMAL HIGH (ref 26.0–34.0)
MCHC: 33.2 g/dL (ref 30.0–36.0)
MCV: 104.8 fL — ABNORMAL HIGH (ref 80.0–100.0)
Platelets: 161 10*3/uL (ref 150–400)
RBC: 3.56 MIL/uL — ABNORMAL LOW (ref 3.87–5.11)
RDW: 12.7 % (ref 11.5–15.5)
WBC: 7.8 10*3/uL (ref 4.0–10.5)
nRBC: 0 % (ref 0.0–0.2)

## 2023-09-04 LAB — BASIC METABOLIC PANEL
Anion gap: 13 (ref 5–15)
BUN: 18 mg/dL (ref 6–20)
CO2: 16 mmol/L — ABNORMAL LOW (ref 22–32)
Calcium: 8.7 mg/dL — ABNORMAL LOW (ref 8.9–10.3)
Chloride: 107 mmol/L (ref 98–111)
Creatinine, Ser: 0.61 mg/dL (ref 0.44–1.00)
GFR, Estimated: >60 mL/min (ref >60)
Glucose, Bld: 79 mg/dL (ref 70–99)
Potassium: 3.6 mmol/L (ref 3.5–5.1)
Sodium: 136 mmol/L (ref 135–145)

## 2023-09-04 LAB — BLOOD GAS, VENOUS
Acid-base deficit: 4.8 mmol/L — ABNORMAL HIGH (ref 0.0–2.0)
Bicarbonate: 19.7 mmol/L — ABNORMAL LOW (ref 20.0–28.0)
O2 Saturation: 87.8 %
Patient temperature: 37
pCO2, Ven: 34 mm[Hg] — ABNORMAL LOW (ref 44–60)
pH, Ven: 7.37 (ref 7.25–7.43)
pO2, Ven: 57 mm[Hg] — ABNORMAL HIGH (ref 32–45)

## 2023-09-04 LAB — TROPONIN I (HIGH SENSITIVITY): Troponin I (High Sensitivity): 3 ng/L (ref <18)

## 2023-09-04 LAB — CBG MONITORING, ED
Glucose-Capillary: 69 mg/dL — ABNORMAL LOW (ref 70–99)
Glucose-Capillary: 76 mg/dL (ref 70–99)

## 2023-09-04 LAB — HCG, SERUM, QUALITATIVE: Preg, Serum: NEGATIVE

## 2023-09-04 LAB — D-DIMER, QUANTITATIVE: D-Dimer, Quant: 0.37 ug{FEU}/mL (ref 0.00–0.50)

## 2023-09-04 MED ORDER — KETOROLAC TROMETHAMINE 30 MG/ML IJ SOLN
15.0000 mg | Freq: Once | INTRAMUSCULAR | Status: AC
  Administered 2023-09-04: 15 mg via INTRAVENOUS
  Filled 2023-09-04: qty 1

## 2023-09-04 MED ORDER — SODIUM CHLORIDE 0.9 % IV BOLUS
1000.0000 mL | Freq: Once | INTRAVENOUS | Status: AC
  Administered 2023-09-04: 1000 mL via INTRAVENOUS

## 2023-09-04 MED ORDER — DIPHENHYDRAMINE HCL 50 MG/ML IJ SOLN
12.5000 mg | Freq: Once | INTRAMUSCULAR | Status: AC
Start: 2023-09-04 — End: 2023-09-04
  Administered 2023-09-04: 12.5 mg via INTRAVENOUS
  Filled 2023-09-04: qty 1

## 2023-09-04 MED ORDER — METOCLOPRAMIDE HCL 5 MG/ML IJ SOLN
10.0000 mg | Freq: Once | INTRAMUSCULAR | Status: AC
Start: 2023-09-04 — End: 2023-09-04
  Administered 2023-09-04: 10 mg via INTRAVENOUS
  Filled 2023-09-04: qty 2

## 2023-09-04 NOTE — ED Notes (Signed)
CBG - 75. Pt stated that she just finished eating saltine crackers and drinking ginger ale

## 2023-09-04 NOTE — ED Triage Notes (Signed)
Pt states dizziness that started all of a sudden when at work, c/o headache. Pt has not eaten or drank today. Pt vomiting in triage.
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
## 2023-09-04 NOTE — Discharge Instructions (Signed)
You have been seen today for your complaint of your primary care provider in 1 week for reevaluation. Your lab work showed a low sugar initially which improved. Your imaging was reassuring. Home care instructions are as follows:  Eat 3 meals a day.  Having plenty of water Follow up with: Your primary care provider in 1 week for reevaluation Please seek immediate medical care if you develop any of the following symptoms: You pass out or faint. You have any of these symptoms: Fast or uneven heartbeats (palpitations). Pain in your chest, belly, or back. Shortness of breath. You have a seizure. You have a very bad headache. You are confused. You have trouble seeing. You are very weak. You have trouble walking. You are bleeding from your mouth or butt. You have black or tarry poop (stool). At this time there does not appear to be the presence of an emergent medical condition, however there is always the potential for conditions to change. Please read and follow the below instructions.  Do not take your medicine if  develop an itchy rash, swelling in your mouth or lips, or difficulty breathing; call 911 and seek immediate emergency medical attention if this occurs.  You may review your lab tests and imaging results in their entirety on your MyChart account.  Please discuss all results of fully with your primary care provider and other specialist at your follow-up visit.  Note: Portions of this text may have been transcribed using voice recognition software. Every effort was made to ensure accuracy; however, inadvertent computerized transcription errors may still be present.

## 2023-10-25 ENCOUNTER — Telehealth: Payer: MEDICAID | Admitting: Physician Assistant

## 2023-10-25 DIAGNOSIS — K219 Gastro-esophageal reflux disease without esophagitis: Secondary | ICD-10-CM | POA: Diagnosis not present

## 2023-10-25 MED ORDER — OMEPRAZOLE 40 MG PO CPDR: 40.0000 mg | DELAYED_RELEASE_CAPSULE | Freq: Every day | ORAL | 0 refills | Status: AC

## 2023-10-25 NOTE — Patient Instructions (Signed)
Stephanie [name], thank you for joining Stephanie Loveless, PA-C for today's virtual visit.  While this provider is not your primary care provider (PCP), if your PCP is located in our provider database this encounter information will be shared with them immediately following your visit.   A [facility] MyChart account gives you access to today's visit and all your visits, tests, and labs performed at [facility] " click here if you don't have a [facility] MyChart account or go to [email or web address]  Consent: (Patient) Stephanie [name] provided verbal consent for this virtual visit at the beginning of the encounter.  Current Medications:  Current Outpatient Medications:    omeprazole (PRILOSEC) 40 MG capsule, Take 1 capsule (40 mg total) by mouth daily., Disp: 30 capsule, Rfl: 0   lisinopril (ZESTRIL) 10 MG tablet, Take 1 tablet (10 mg total) by mouth daily., Disp: 30 tablet, Rfl: 11   metroNIDAZOLE (FLAGYL) 500 MG tablet, Take 1 tablet (500 mg total) by mouth 2 (two) times daily., Disp: 14 tablet, Rfl: 0   Medications ordered in this encounter:  Meds ordered this encounter  Medications   omeprazole (PRILOSEC) 40 MG capsule    Sig: Take 1 capsule (40 mg total) by mouth daily.    Dispense:  30 capsule    Refill:  0    Supervising Provider:   Merrilee Jansky [[id number]]     *If you need refills on other medications prior to your next appointment, please contact your pharmacy*  Follow-Up: Call back or seek an in-person evaluation if the symptoms worsen or if the condition fails to improve as anticipated.  [facility] [phone]  Other Instructions Overview What is laryngopharyngeal reflux (LPR)? Laryngopharyngeal ("la-Ring-go-fa-Rin-jee-al") reflux, or LPR, is a special type of acid reflux. Acid reflux occurs when stomach juices (including acid) rise from your stomach into your esophagus (swallowing tube). Symptoms of acid reflux usually affect  your lower esophagus, within your chest. But if you have LPR, the reflux has a habit of creeping higher up, into your larynx (voice box) and pharynx (throat).  LPR is also called "extraesophageal" reflux, because the reflux passes beyond your esophagus. This causes different symptoms from typical acid reflux -- so different that you might not realize it's a type of reflux at all. Instead of causing heartburn and indigestion, LPR tends to irritate your voice, throat and sinuses. Because you can have LPR without other reflux symptoms, it's sometimes called "silent reflux."  LPR vs. GERD -- what's the difference? GERD stands for gastroesophageal reflux disease, otherwise known as chronic acid reflux. This is what we call it when stomach acid routinely rises into your esophagus, which runs from your throat down to your stomach. GERD more often affects your lower esophagus, while LPR reaches higher up in your throat. Some people have LPR in addition to GERD, but other people only have LPR symptoms.  How common is laryngopharyngeal reflux? Healthcare providers estimate that more than half of people who complain of chronic hoarseness have laryngopharyngeal reflux. About 10% of people who visit a throat specialist are diagnosed with LPR.  Symptoms and Causes What are LPR symptoms? Laryngopharyngeal reflux symptoms include:  Hoarseness and/or lowering of your voice register. A lump or a feeling of something stuck in your throat. Throat clearing. Chronic cough. Excessive mucus or phlegm. Difficulty swallowing. Chronic sore throat. Laryngitis (inflammation of your vocal cords or losing your voice). Wheezing. Postnasal drip. Frequent upper respiratory infections. New or worsening asthma.  What is the main cause of LPR? For gastric juices to travel from your stomach all the way up through your esophagus and into your throat, they have to get past two important guards. These are your upper and lower  esophageal sphincters -- the muscular valves that seal off your esophagus at the top and bottom. The lower one separates your esophagus and stomach, while the upper one separates your esophagus and throat.  Normal acid reflux happens when something weakens your lower esophageal sphincter (LES), allowing stomach juices to flow back up into your esophagus. LPR happens when your upper esophageal sphincter (UES) also relaxes inappropriately. This allows reflux that's already in your esophagus to creep up higher into your throat. Different things can affect these two sphincters and cause them to relax.  What are some specific causes that can lead to laryngopharyngeal reflux? A lot of things can affect how well your esophageal sphincters close to keep substances out. Some of these factors weaken the muscles gradually over time, while other factors can affect them temporarily. Most people have more than one factor affecting them. Healthcare providers don't always know exactly which ones caused LPR, but they often find that if you reduce these factors, your reflux reduces.  1. Breaching your LES Your lower esophageal sphincter (LES) is the first guard against acid reflux from your stomach into your esophagus. Frequent, substantial acid reflux will cause symptoms of GERD, but you can have a small amount of reflux in your esophagus without feeling it. Your esophagus has many layers of protection against acid reflux, so it takes a lot to wear it down. Your throat doesn't have the same protection.  Common factors that may weaken your LES temporarily include:  Medications. Certain medications can have a relaxing effect on your LES, including:  Benzodiazepines, a type of sedative. Calcium channel blockers, which treat high blood pressure. Tricyclic antidepressants, which treat depression and pain. NSAIDs (nonsteroidal anti-inflammatory drugs) like aspirin and ibuprofen. Theophylline, a common asthma  medication. Hormone therapy (HT) medications for menopause. Foods and drinks. Foods and drinks that may have a relaxing effect on your LES include:  Coffee. Chocolate. Alcohol. Mint. Garlic. Onions. Lifestyle habits. Simple things can temporarily weaken your LES by increasing abdominal pressure against it, or by taking away the advantage of gravity, which helps keep it closed.  Lying down or reclining too soon after eating. Sleeping on your back, which submerges your LES inside your stomach contents. Eating larger meals, which expands your abdomen and increases digestion time. Wearing tight clothes or belts around your abdomen, especially when sitting. Common factors that may weaken your LES progressively over time include:  Hiatal hernia. When your stomach bulges up through a hole in your diaphragm, your LES also moves above your diaphragm and loses some of its muscle support system. Pregnancy. Lots of people get temporary acid reflux during pregnancy when abdominal pressure pushes against your diaphragm and LES. Hormones also contribute. Obesity. Obesity is another cause of constant abdominal pressure that can weaken your LES over time. It can also affect your hormone levels. Smoking. Tobacco smoke has a relaxing effect on your LES. It's also associated with coughing, which can put chronic pressure on your LES. It's a common cause of hiatal hernia. 2. Breaching your UES Once stomach juices are in your esophagus, it's up to your upper esophageal sphincter (UES) to keep them out of your throat. You may only have a small, unnoticeable amount of reflux in your esophagus, but it doesn't  take much to irritate your throat tissues. They don't have the same protective lining as your esophagus, and they also don't have the same mechanisms that wash reflux out, so it stays longer.  Common factors that may weaken or relax your UES include:  Lying down. Some people have LPR during the night because  their esophageal sphincters both relax a little when they lie down. Burping. Burping is one reflex that can trigger both your LES and your UES to open. Gas bubbles can carry small amounts of stomach juices into your throat. Bending over, exercising or singing. These activities build pressure under your UES, which may weaken it. Smoking and alcohol use. These substances have a relaxing effect on both of your esophageal sphincter muscles. What are the complications of LPR? Laryngopharyngeal reflux may cause:  Excessive mucus and frequent infections. Stomach acid interferes with the normal mechanisms that clear mucus and infections out of your throat and sinuses. Mucus exists to trap infections and help clear them out. When mucus doesn't get cleared out, infections don't, either. Chronic voice and throat irritation. Chronic voice and throat irritation can interfere with your ability to speak and swallow. Over time, it can cause vocal cord lesions (growths) to develop. Long-term vocal inflammation (laryngitis) is also a risk factor for developing laryngeal cancer. Respiratory complications. Acid in your larynx may pass through your trachea (windpipe) into your bronchial tubes and lungs. You can inhale tiny acid particles without realizing it, especially in your sleep (silent aspiration). This can cause bronchial inflammation and infections. Diagnosis and Tests How can you tell if you have LPR? If you have chronic hoarseness, there's a 50% chance you have LPR. Look out for other related symptoms, as well. Most people with LPR are unaware of having acid reflux. You might think that you have allergies or an endless cold. Actually, many people develop their first symptoms of LPR shortly after an infection that irritated their throat. This irritation set the stage for reflux to do its own damage.  How is LPR diagnosed? An otolaryngologist -- an ear, nose and throat doctor -- typically diagnoses LPR. They'll  listen to your symptoms, then take a look inside your throat for signs of inflammation or tissue damage. A flexible laryngoscopy is a simple, in-office procedure they can use to look inside your throat. They pass a laryngoscope, a tiny, lighted camera on the end of a slim tube, through your nose into your throat.  Based on what they find, your provider might feel confident enough to guess that you have LPR. They might take the approach of treating it with medication to see if your symptoms improve and confirm your condition that way. Or they might want to run additional tests to confirm their suspicions or rule out other possible causes. Additional tests for laryngopharyngeal reflux (LPR) may include:  Upper endoscopy. This is another type of endoscopic exam that looks further down into your upper gastrointestinal tract. The endoscope passes from your mouth through your throat, esophagus and stomach. This can show what's going on with both your esophageal sphincters. Esophageal pH test. A healthcare provider places one or several sensors in your throat and/or esophagus to monitor acid levels. The sensor stays in place for 24 hours, then your provider collects and reads the data. Different acid levels in different places indicate GERD and/or LPR. Esophageal manometry. This test measures the muscle activity in your esophagus, using pressure sensors embedded in a nasogastric tube. It can measure the activity and strength  of both your esophageal sphincters, as well as the muscles that clear acid from your esophagus. Management and Treatment How do I get rid of LPR? The approach to treating LPR depends on how severe it is and how serious the cause is. In many cases, there's no serious problem with your esophageal sphincter muscles, and diet and lifestyle changes can make a real difference in reducing LPR reflux. Medication can help heal your tissues as these adjustments begin to take effect. But some people do  need more extensive treatment than others.  Can you treat LPR naturally? Some people can solve their LPR with lifestyle adjustments alone. In general, LPR is more likely than GERD to improve without medication, because LPR may be caused by only a small amount of reflux. It takes time for LPR to heal, though, so it may be several months before you can tell if your adjustments are working. Medications called proton pump inhibitors (PPIs) can help speed up the healing process.  What is the medical treatment for laryngopharyngeal reflux? Treatment for laryngopharyngeal reflux begins with addressing the cause. Often, there's no one obvious cause, so healthcare providers focus on diet and lifestyle adjustments to reduce all possible contributing causes. This might mean addressing habits like smoking, drinking alcohol or coffee, or adjusting the way you eat and sleep. Some people might need treatment for an underlying condition, like an esophageal disorder.  Medication Medication usually plays a limited role in treating laryngopharyngeal reflux. For example, your provider might prescribe proton pump inhibitors for several months while you aim to reduce your reflux with lifestyle changes. These neutralize the acid in your reflux and also coat and protect the tissues in your throat while they heal. If this approach works, you'll be able to discontinue medication after a while.  If you continue to have symptoms, you might need to use an acid-blocking medication or another medication long-term. Acid blockers like proton pump inhibitors and H2 blockers can help when you continue to have reflux despite efforts to reduce it. These medications reduce the acid content in your reflux. Medications called alginates can help protect against other irritants in your reflux, like enzymes.  Surgery Surgery isn't typical for laryngopharyngeal reflux unless you have an obvious defect affecting your esophageal sphincter muscles,  like a hiatal hernia. A minor procedure called a Nissen fundoplication can repair a hiatal hernia and reinforce your lower esophageal sphincter, which is your first guard against acid reflux. If your upper esophageal sphincter also needs reinforcing, similar procedures are possible.  Outlook / Prognosis What's the outlook with laryngopharyngeal reflux (LPR)? Getting an accurate diagnosis, discovering the contributing causes and targeting them with the right treatment can be a process. But once the way is clear, treatment for LPR is usually brief and effective. Most people won't need long-term prescription medications or other interventions. The key to recovery lies in making helpful lifestyle changes and taking care to protect your throat and voice while they heal.  Living With What diet and lifestyle changes help with LPR? Healthcare providers suggest that you:  Eat smaller meals. Try five to six mini meals instead of three bigger ones. Avoid rich, spicy and acidic foods. These can increase the acid and other irritants in your reflux. Eat dinner earlier. Try not to recline or lie down for three hours after eating. Sleep on your left side. This positions your lower esophageal sphincter in an air pocket above your stomach contents, which reduces reflux while you sleep. Avoid excessive burping.  Avoid carbonated beverages and eat slowly so you don't swallow air. If you have chronic burping, you might have a digestive disorder that needs treating. Reduce abdominal pressure. Wearing loose clothes around your waistline is a start. Reducing abdominal volume is better. A healthcare provider can discuss weight loss options with you. Quit smoking. Ask your healthcare provider about resources to help you quit. Reduce alcohol. Talk to your healthcare provider if you think you have alcohol use disorder.   If you have been instructed to have an in-person evaluation today at a local Urgent Care facility, please  use the link below. It will take you to a list of all of our available [facility], including address, phone number and hours of operation. Please do not delay care.  [facility]  If you or a family member do not have a primary care provider, use the link below to schedule a visit and establish care. When you choose a [facility] primary care physician or advanced practice provider, you gain a long-term partner in health. Find a Primary Care Provider  Learn more about [facility] in-office and virtual care options: [facility] - Get Care Now

## 2023-10-25 NOTE — Progress Notes (Signed)
Virtual Visit Consent   Stephanie [name], you are scheduled for a virtual visit with a  provider today. Just as with appointments in the office, your consent must be obtained to participate. Your consent will be active for this visit and any virtual visit you may have with one of our providers in the next 365 days. If you have a MyChart account, a copy of this consent can be sent to you electronically.  As this is a virtual visit, video technology does not allow for your provider to perform a traditional examination. This may limit your provider's ability to fully assess your condition. If your provider identifies any concerns that need to be evaluated in person or the need to arrange testing (such as labs, EKG, etc.), we will make arrangements to do so. Although advances in technology are sophisticated, we cannot ensure that it will always work on either your end or our end. If the connection with a video visit is poor, the visit may have to be switched to a telephone visit. With either a video or telephone visit, we are not always able to ensure that we have a secure connection.  By engaging in this virtual visit, you consent to the provision of healthcare and authorize for your insurance to be billed (if applicable) for the services provided during this visit. Depending on your insurance coverage, you may receive a charge related to this service.  I need to obtain your verbal consent now. Are you willing to proceed with your visit today? KAREE FORGE has provided verbal consent on 10/25/2023 for a virtual visit (video or telephone). [name], PA-C  Date: 10/25/2023 10:38 AM  Virtual Visit via Video Note   I, [name], connected with  Stephanie [name]  ([id number], May 14, 1981) on 10/25/23 at 10:30 AM EST by a video-enabled telemedicine application and verified that I am speaking with the correct person using two identifiers.  Location: Patient: Virtual Visit  Location Patient: Mobile Provider: Virtual Visit Location Provider: Home Office   I discussed the limitations of evaluation and management by telemedicine and the availability of in person appointments. The patient expressed understanding and agreed to proceed.    History of Present Illness: Stephanie [name] is a 43 y.o. who identifies as a female who was assigned female at birth, and is being seen today for throat fullness.  HPI: Sore Throat  This is a new problem. The current episode started 1 to 4 weeks ago (2 weeks). The problem has been gradually worsening. There has been no fever. The patient is experiencing no pain. Associated symptoms include headaches, neck pain, swollen glands and trouble swallowing. Pertinent negatives include no congestion, coughing, diarrhea, drooling, ear discharge, ear pain, hoarse voice, plugged ear sensation, shortness of breath or vomiting. Associated symptoms comments: Globus sensation. Treatments tried: cough medication, cough drops, topical NSAID on neck. The treatment provided no relief.      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 02/26/2018 02/26/2018   External hemorrhoids status post hemorrhoidectomy x2, 02/26/2018 02/26/2018   Hypokalemia 02/25/2018   Hypomagnesemia 02/25/2018   Pelvic pain 02/21/2018   Schizophrenia (HCC)    Panic attacks    Hypertension    PTSD (post-traumatic stress disorder)    Constipation, chronic 02/20/2018   Sweaty palms 10/25/2017  Encounter to establish care 10/25/2017   Depression 10/25/2017   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   Prolapsed internal hemorrhoids, grade 2&3, s/p hemorrhoidal ligation/pexy 02/26/2018 05/01/2013    Allergies: No Known Allergies Medications:  Current Outpatient  Medications:    omeprazole (PRILOSEC) 40 MG capsule, Take 1 capsule (40 mg total) by mouth daily., Disp: 30 capsule, Rfl: 0   lisinopril (ZESTRIL) 10 MG tablet, Take 1 tablet (10 mg total) by mouth daily., Disp: 30 tablet, Rfl: 11   metroNIDAZOLE (FLAGYL) 500 MG tablet, Take 1 tablet (500 mg total) by mouth 2 (two) times daily., Disp: 14 tablet, Rfl: 0  Observations/Objective: Patient is well-developed, well-nourished in no acute distress.  Resting comfortably  Head is normocephalic, atraumatic.  No labored breathing.  Speech is clear and coherent with logical content.  Patient is alert and oriented at baseline.    Assessment and Plan: 1. Gastroesophageal reflux disease without esophagitis (Primary) - omeprazole (PRILOSEC) 40 MG capsule; Take 1 capsule (40 mg total) by mouth daily.  Dispense: 30 capsule; Refill: 0  - Suspect silent GERD or possible laryngopharyngeal reflux - Omeprazole added; take daily for at least 2 weeks - Tonsillitis on differential but since symptoms over 2 weeks with no other signs of infection ruled less likely; Tonsil stones less likely due to bilateral sensation and no report of stones seen - Push fluids - Follow up in person if symptoms do not improve or worsen  Follow Up Instructions: I discussed the assessment and treatment plan with the patient. The patient was provided an opportunity to ask questions and all were answered. The patient agreed with the plan and demonstrated an understanding of the instructions.  A copy of instructions were sent to the patient via MyChart unless otherwise noted below.    The patient was advised to call back or seek an in-person evaluation if the symptoms worsen or if the condition fails to improve as anticipated.    [name], PA-C

## 2023-10-27 ENCOUNTER — Emergency Department (HOSPITAL_COMMUNITY)
Admission: EM | Admit: 2023-10-27 | Discharge: 2023-10-27 | Disposition: A | Discharge status: Home / Self Care | Payer: MEDICAID | Attending: Emergency Medicine | Admitting: Emergency Medicine

## 2023-10-27 ENCOUNTER — Other Ambulatory Visit: Payer: Self-pay

## 2023-10-27 ENCOUNTER — Encounter (HOSPITAL_COMMUNITY): Payer: Self-pay

## 2023-10-27 DIAGNOSIS — J039 Acute tonsillitis, unspecified: Secondary | ICD-10-CM | POA: Insufficient documentation

## 2023-10-27 DIAGNOSIS — Z20822 Contact with and (suspected) exposure to covid-19: Secondary | ICD-10-CM | POA: Diagnosis not present

## 2023-10-27 DIAGNOSIS — J029 Acute pharyngitis, unspecified: Secondary | ICD-10-CM | POA: Diagnosis present

## 2023-10-27 LAB — RESP PANEL BY RT-PCR (RSV, FLU A&B, COVID)  RVPGX2
Influenza A by PCR: NEGATIVE
Influenza B by PCR: NEGATIVE
Resp Syncytial Virus by PCR: NEGATIVE
SARS Coronavirus 2 by RT PCR: NEGATIVE

## 2023-10-27 LAB — GROUP A STREP BY PCR: Group A Strep by PCR: NOT DETECTED

## 2023-10-27 MED ORDER — ACETAMINOPHEN 325 MG PO TABS
650.0000 mg | ORAL_TABLET | Freq: Once | ORAL | Status: AC
  Administered 2023-10-27: 650 mg via ORAL
  Filled 2023-10-27: qty 2

## 2023-10-27 MED ORDER — LIDOCAINE VISCOUS HCL 2 % MT SOLN
15.0000 mL | Freq: Once | OROMUCOSAL | Status: AC
  Administered 2023-10-27: 15 mL via OROMUCOSAL
  Filled 2023-10-27: qty 15

## 2023-10-27 NOTE — Discharge Instructions (Addendum)
Please follow-up with your primary care provider in regards resumes ER visit.  Today your labs are reassuring you most likely have tonsillitis causing your symptoms.  Please avoid course foods or foods that may irritate your throat.  You may use cough drops or Chloraseptic spray over-the-counter to help numb up the area along with Tylenol every 6 hours needed for pain.  I have attached an ENT specialist as per your request.  If symptoms change or worsen please return to the ER.

## 2023-10-27 NOTE — ED Triage Notes (Signed)
Pt reports with a sore throat and headache off and on for the past month. Pt reports having a tele visit and they told her it was tonsillitis.

## 2023-10-27 NOTE — ED Notes (Addendum)
Alert, NAD, calm, interactive, resps e/u, speaking clearly, EDPS at [facility], pending throat and nasal specimen results

## 2023-10-27 NOTE — ED Provider Notes (Signed)
[place] EMERGENCY DEPARTMENT AT [facility] Provider Note   CSN: [id number] Arrival date & time: 10/27/23  1255     History  Chief Complaint  Patient presents with   Sore Throat   Headache    Stephanie [name] is a 43 y.o. female history of hypertension, schizophrenia presenting sore throat for the past 4 weeks.  Patient went to her primary care provider to be telemedicine and was told that she had tonsillitis.  Patient able to eat and drink without issue denies fevers, neck stiffness, shortness of breath, chest pain.  Patient denies nausea vomiting.  Patient tried Tylenol with no relief.    Home Medications Prior to Admission medications   Medication Sig Start Date End Date Taking? Authorizing Provider  lisinopril (ZESTRIL) 10 MG tablet Take 1 tablet (10 mg total) by mouth daily. 04/25/22 04/25/23  Crissie Sickles, MD  metroNIDAZOLE (FLAGYL) 500 MG tablet Take 1 tablet (500 mg total) by mouth 2 (two) times daily. 05/19/23   Junie Spencer, FNP  omeprazole (PRILOSEC) 40 MG capsule Take 1 capsule (40 mg total) by mouth daily. 10/25/23   [name], PA-C      Allergies    Patient has no known allergies.    Review of Systems   Review of Systems  Neurological:  Positive for headaches.    Physical Exam Updated Vital Signs BP (!) 140/94 (BP Location: Left Arm)   Pulse (!) 102   Temp 98.9 F (37.2 C) (Oral)   Resp 18   Ht 5\' 7"  (1.702 m)   Wt 77.1 kg   SpO2 91%   BMI 26.62 kg/m  Physical Exam Constitutional:      General: She is not in acute distress. HENT:     Head: Normocephalic and atraumatic.     Jaw: There is normal jaw occlusion.     Salivary Glands: Right salivary gland is not diffusely enlarged or tender. Left salivary gland is not diffusely enlarged or tender.     Comments: No facial swelling    Right Ear: Hearing normal.     Left Ear: Hearing normal.     Nose: Nose normal.     Mouth/Throat:     Lips: Pink.     Mouth: Mucous membranes  are moist.     Pharynx: Uvula midline. Oropharyngeal exudate present. No posterior oropharyngeal erythema.     Comments: No oral floor swelling Tolerating secretions No purulent drainage no No PTA noted No muffled voice noted Eyes:     Extraocular Movements: Extraocular movements intact.     Conjunctiva/sclera: Conjunctivae normal.     Pupils: Pupils are equal, round, and reactive to light.  Neck:     Comments: No neck swelling Cardiovascular:     Rate and Rhythm: Normal rate and regular rhythm.     Pulses: Normal pulses.     Heart sounds: Normal heart sounds.  Pulmonary:     Effort: Pulmonary effort is normal. No respiratory distress.     Breath sounds: Normal breath sounds.  Musculoskeletal:     Cervical back: Normal range of motion and neck supple. No rigidity or tenderness.  Neurological:     Mental Status: She is alert.     ED Results / Procedures / Treatments   Labs (all labs ordered are listed, but only abnormal results are displayed) Labs Reviewed  RESP PANEL BY RT-PCR (RSV, FLU A&B, COVID)  RVPGX2  GROUP A STREP BY PCR    EKG None  Radiology No results found.  Procedures Procedures    Medications Ordered in ED Medications  lidocaine (XYLOCAINE) 2 % viscous mouth solution 15 mL (15 mLs Mouth/Throat Given 10/27/23 1428)  acetaminophen (TYLENOL) tablet 650 mg (650 mg Oral Given 10/27/23 1433)    ED Course/ Medical Decision Making/ A&P                                 Medical Decision Making Risk Prescription drug management.   [name] 43 y.o. presented today for sore throat.  Working DDx that I considered at this time includes, but not limited to, viral, strep pharyngitis, mononucleosis, coxsackie, Kawasaki, angioedema, SJS, RPA, PTA, Ludwig's angina, epiglottitis, thrush, parotiditis, acute necrotizing ulcerative gingivitis, sinusitis, inhaled irritants.  R/o DDx: viral, strep pharyngitis, mononucleosis, coxsackie, Kawasaki, angioedema, SJS,  RPA, PTA, Ludwig's angina, epiglottitis, thrush, parotiditis, acute necrotizing ulcerative gingivitis, sinusitis, inhaled irritants: These are considered less likely due to history of present illness, physical exam, labs/imaging findings  Review of prior external notes: 10/25/2023 video visit  Unique Tests and My Independent Interpretation:  Respiratory panel: Group A strep: Negative  Social Determinants of Health: none  Discussion with Independent Historian: None  Discussion of Management of Tests: None  Risk: Medium: prescription drug management  Risk Stratification Score: None  Plan: On exam patient was no acute distress stable vitals.  On exam patient did have erythema in her oropharynx however did not have any other concerning features.  Will obtain swabs to rule out viral illness or strep throat.  Patient given with viscous lidocaine for comfort.  Patient's labs were ultimately negative.  Patient was p.o. challenged successfully.  At this time despite patient has tonsillitis and recommend she takes Tylenol every 6 hours as needed for pain along with soft diet and using Chloraseptic numbing spray over-the-counter and follow-up with her primary care provider.  Patient requests ENT referral which is reasonable so we will add on.  Patient was given return precautions. Patient stable for discharge at this time.  Patient verbalized understanding of plan.  This chart was dictated using voice recognition software.  Despite best efforts to proofread,  errors can occur which can change the documentation meaning.        Final Clinical Impression(s) / ED Diagnoses Final diagnoses:  Tonsillitis    Rx / DC Orders ED Discharge Orders     None         Remi Deter 10/27/23 1443    Linwood Dibbles, MD 10/28/23 [phone]

## 2023-10-29 ENCOUNTER — Emergency Department (HOSPITAL_COMMUNITY)
Admission: EM | Admit: 2023-10-29 | Discharge: 2023-10-29 | Disposition: A | Discharge status: Home / Self Care | Payer: MEDICAID | Attending: Emergency Medicine | Admitting: Emergency Medicine

## 2023-10-29 ENCOUNTER — Other Ambulatory Visit: Payer: Self-pay

## 2023-10-29 ENCOUNTER — Encounter (HOSPITAL_COMMUNITY): Payer: Self-pay

## 2023-10-29 DIAGNOSIS — G43809 Other migraine, not intractable, without status migrainosus: Secondary | ICD-10-CM | POA: Diagnosis not present

## 2023-10-29 DIAGNOSIS — Z20822 Contact with and (suspected) exposure to covid-19: Secondary | ICD-10-CM | POA: Diagnosis not present

## 2023-10-29 DIAGNOSIS — I10 Essential (primary) hypertension: Secondary | ICD-10-CM | POA: Insufficient documentation

## 2023-10-29 DIAGNOSIS — R519 Headache, unspecified: Secondary | ICD-10-CM | POA: Diagnosis present

## 2023-10-29 LAB — RESP PANEL BY RT-PCR (RSV, FLU A&B, COVID)  RVPGX2
Influenza A by PCR: NEGATIVE
Influenza B by PCR: NEGATIVE
Resp Syncytial Virus by PCR: NEGATIVE
SARS Coronavirus 2 by RT PCR: NEGATIVE

## 2023-10-29 MED ORDER — KETOROLAC TROMETHAMINE 15 MG/ML IJ SOLN
15.0000 mg | Freq: Once | INTRAMUSCULAR | Status: AC
  Administered 2023-10-29: 15 mg via INTRAVENOUS
  Filled 2023-10-29: qty 1

## 2023-10-29 MED ORDER — ACETAMINOPHEN 325 MG PO TABS
650.0000 mg | ORAL_TABLET | Freq: Once | ORAL | Status: AC
  Administered 2023-10-29: 650 mg via ORAL
  Filled 2023-10-29: qty 2

## 2023-10-29 MED ORDER — ONDANSETRON 4 MG PO TBDP
4.0000 mg | ORAL_TABLET | Freq: Once | ORAL | Status: AC
Start: 2023-10-29 — End: 2023-10-29
  Administered 2023-10-29: 4 mg via ORAL
  Filled 2023-10-29: qty 1

## 2023-10-29 MED ORDER — DEXAMETHASONE SODIUM PHOSPHATE 10 MG/ML IJ SOLN
8.0000 mg | Freq: Once | INTRAMUSCULAR | Status: AC
  Administered 2023-10-29: 8 mg via INTRAVENOUS
  Filled 2023-10-29: qty 1

## 2023-10-29 MED ORDER — ONDANSETRON HCL 4 MG/2ML IJ SOLN
4.0000 mg | Freq: Once | INTRAMUSCULAR | Status: AC
  Administered 2023-10-29: 4 mg via INTRAVENOUS
  Filled 2023-10-29: qty 2

## 2023-10-29 MED ORDER — ONDANSETRON 4 MG PO TBDP: 4.0000 mg | ORAL_TABLET | Freq: Three times a day (TID) | ORAL | 0 refills | Status: AC | PRN

## 2023-10-29 MED ORDER — MAGNESIUM SULFATE 2 GM/50ML IV SOLN
2.0000 g | Freq: Once | INTRAVENOUS | Status: AC
  Administered 2023-10-29: 2 g via INTRAVENOUS
  Filled 2023-10-29: qty 50

## 2023-10-29 MED ORDER — SODIUM CHLORIDE 0.9 % IV BOLUS
1000.0000 mL | Freq: Once | INTRAVENOUS | Status: AC
  Administered 2023-10-29: 1000 mL via INTRAVENOUS

## 2023-10-29 MED ORDER — DIPHENHYDRAMINE HCL 50 MG/ML IJ SOLN
12.5000 mg | Freq: Once | INTRAMUSCULAR | Status: AC
  Administered 2023-10-29: 12.5 mg via INTRAVENOUS
  Filled 2023-10-29: qty 1

## 2023-10-29 NOTE — ED Notes (Signed)
Informed triage nurse about pt wanted to know how much longer for a room

## 2023-10-29 NOTE — Discharge Instructions (Signed)
 I have sent a prescription of Zofran  to the pharmacy.  You can take this every 8 hours as needed for nausea and vomiting.  Follow-up with your primary care provider in the next week for reevaluation.  Get help right away if: Your migraine headache gets very bad. Your migraine headache lasts more than 72 hours. You have a fever or stiff neck. You have trouble seeing. Your muscles feel weak or like you cannot control them. You lose your balance a lot. You have trouble walking. You faint. You have a seizure.

## 2023-10-29 NOTE — ED Triage Notes (Signed)
Patient recently seen 2 days ago for same symptoms. Patient reports headache, nausea, and vomiting x 1 month. Thinks it is her BP. BP 145/88. Has not followed up with a PCP.

## 2023-10-29 NOTE — ED Provider Notes (Signed)
 [place] EMERGENCY DEPARTMENT AT [facility] Provider Note   CSN: [id number] Arrival date & time: 10/29/23  9358     History  Chief Complaint  Patient presents with   Emesis   Headache    Stephanie [name] is a 43 y.o. female with a history of schizophrenia, hypertension, and headaches who presents the ED today for migraine.  Patient reports headaches, nausea, and vomiting for the past month.  Patient was seen in the ED 2 days ago for same complaint and states that her headache did not improve.      Home Medications Prior to Admission medications   Medication Sig Start Date End Date Taking? Authorizing Provider  ondansetron  (ZOFRAN -ODT) 4 MG disintegrating tablet Take 1 tablet (4 mg total) by mouth every 8 (eight) hours as needed for nausea or vomiting. 10/29/23 11/28/23 Yes Waddell Sluder, PA-C  lisinopril  (ZESTRIL ) 10 MG tablet Take 1 tablet (10 mg total) by mouth daily. 04/25/22 04/25/23  Rudy Sieving, MD  metroNIDAZOLE  (FLAGYL ) 500 MG tablet Take 1 tablet (500 mg total) by mouth 2 (two) times daily. 05/19/23   Lavell Bari LABOR, FNP  omeprazole  (PRILOSEC) 40 MG capsule Take 1 capsule (40 mg total) by mouth daily. 10/25/23   Burnette, Jennifer M, PA-C      Allergies    Patient has no known allergies.    Review of Systems   Review of Systems  Gastrointestinal:  Positive for vomiting.  Neurological:  Positive for headaches.  All other systems reviewed and are negative.   Physical Exam Updated Vital Signs BP (!) 148/95   Pulse 80   Temp 98.5 F (36.9 C) (Oral)   Resp 18   Ht 5' 7 (1.702 m)   Wt 77.1 kg   SpO2 100%   BMI 26.62 kg/m  Physical Exam Vitals and nursing note reviewed.  Constitutional:      General: She is not in acute distress.    Appearance: Normal appearance.  HENT:     Head: Normocephalic and atraumatic.     Mouth/Throat:     Mouth: Mucous membranes are moist.  Eyes:     Conjunctiva/sclera: Conjunctivae normal.     Pupils: Pupils are  equal, round, and reactive to light.  Cardiovascular:     Rate and Rhythm: Normal rate and regular rhythm.     Pulses: Normal pulses.     Heart sounds: Normal heart sounds.  Pulmonary:     Effort: Pulmonary effort is normal.     Breath sounds: Normal breath sounds.  Abdominal:     Palpations: Abdomen is soft.     Tenderness: There is no abdominal tenderness.  Skin:    General: Skin is warm and dry.     Findings: No rash.  Neurological:     General: No focal deficit present.     Mental Status: She is alert.     Motor: No weakness.  Psychiatric:        Mood and Affect: Mood normal.        Behavior: Behavior normal.    ED Results / Procedures / Treatments   Labs (all labs ordered are listed, but only abnormal results are displayed) Labs Reviewed  RESP PANEL BY RT-PCR (RSV, FLU A&B, COVID)  RVPGX2    EKG None  Radiology No results found.  Procedures Procedures: not indicated.   Medications Ordered in ED Medications  ondansetron  (ZOFRAN -ODT) disintegrating tablet 4 mg (4 mg Oral Given 10/29/23 0748)  acetaminophen  (TYLENOL ) tablet 650  mg (650 mg Oral Given 10/29/23 0749)  sodium chloride  0.9 % bolus 1,000 mL (1,000 mLs Intravenous New Bag/Given 10/29/23 1210)  ketorolac  (TORADOL ) 15 MG/ML injection 15 mg (15 mg Intravenous Given 10/29/23 1227)  ondansetron  (ZOFRAN ) injection 4 mg (4 mg Intravenous Given 10/29/23 1228)  diphenhydrAMINE  (BENADRYL ) injection 12.5 mg (12.5 mg Intravenous Given 10/29/23 1226)  magnesium  sulfate IVPB 2 g 50 mL (2 g Intravenous New Bag/Given 10/29/23 1232)  dexamethasone  (DECADRON ) injection 8 mg (8 mg Intravenous Given 10/29/23 1230)    ED Course/ Medical Decision Making/ A&P                                 Medical Decision Making Risk OTC drugs. Prescription drug management.   This patient presents to the ED for concern of headache, this involves an extensive number of treatment options, and is a complaint that carries with it a high risk of  complications and morbidity.   Differential diagnosis includes: Cluster headache, tension headache, migraine, atypical migraine, dehydration, flu, COVID, RSV, other viral illness, etc.   Comorbidities  See HPI above   Additional History  Additional history obtained from prior records.   Problem List / ED Course / Critical Interventions / Medication Management  Headache with nausea and vomiting x 1 month.  She thinks her headaches may be due to her blood pressure being high.  She has not followed up with her primary care provider regarding her blood pressures.  Initial BP of 147/93 in triage. I ordered medications including: Toradol , Benadryl , Zofran , Decadron , magnesium , and fluids for headache  Reevaluation of the patient after these medicines showed that the patient improved.  Blood pressure improved to 138/88 prior to discharge. I have reviewed the patients home medicines and have made adjustments as needed. Prescription for Zofran  sent to the pharmacy for nausea/vomiting.   Social Determinants of Health  Tobacco use   Test / Admission - Considered  Discussed finds with patient.  All questions answered. Patient is stable and safe discharge home. Return precautions provided.       Final Clinical Impression(s) / ED Diagnoses Final diagnoses:  Other migraine without status migrainosus, not intractable    Rx / DC Orders ED Discharge Orders          Ordered    ondansetron  (ZOFRAN -ODT) 4 MG disintegrating tablet  Every 8 hours PRN        10/29/23 1255              Waddell Sluder, PA-C 10/29/23 1459    Francesca Elsie CROME, MD 10/29/23 1520

## 2023-10-29 NOTE — ED Provider Triage Note (Signed)
 Emergency Medicine Provider Triage Evaluation Note  Stephanie [name] , a 43 y.o. female  was evaluated in triage.  Pt complains of headache.  She reports intermittent headaches for the past month.  Patient has been taking BC powders without any improvement.  Review of Systems  Positive: Headache, nausea, vomiting Negative: Head injuries, vision changes  Physical Exam  BP (!) 147/93 (BP Location: Right Arm)   Pulse 81   Temp 98.4 F (36.9 C) (Oral)   Resp 19   Ht 5' 7 (1.702 m)   Wt 77.1 kg   SpO2 100%   BMI 26.62 kg/m  Gen:   Awake, no distress   Resp:  Normal effort  MSK:   Moves extremities without difficulty  Other:    Medical Decision Making  Medically screening exam initiated at 7:35 AM.  Appropriate orders placed.  Stephanie [name] was informed that the remainder of the evaluation will be completed by another provider, this initial triage assessment does not replace that evaluation, and the importance of remaining in the ED until their evaluation is complete.    Waddell Sluder, PA-C 10/29/23 [phone]

## 2023-12-09 ENCOUNTER — Other Ambulatory Visit: Payer: Self-pay

## 2023-12-09 ENCOUNTER — Emergency Department (HOSPITAL_COMMUNITY)
Admission: EM | Admit: 2023-12-09 | Discharge: 2023-12-09 | Disposition: A | Discharge status: Home / Self Care | Payer: MEDICAID | Attending: Emergency Medicine | Admitting: Emergency Medicine

## 2023-12-09 ENCOUNTER — Encounter (HOSPITAL_COMMUNITY): Payer: Self-pay | Admitting: Emergency Medicine

## 2023-12-09 ENCOUNTER — Emergency Department (HOSPITAL_COMMUNITY): Payer: MEDICAID

## 2023-12-09 DIAGNOSIS — M25561 Pain in right knee: Secondary | ICD-10-CM | POA: Diagnosis present

## 2023-12-09 DIAGNOSIS — S8001XA Contusion of right knee, initial encounter: Secondary | ICD-10-CM | POA: Diagnosis not present

## 2023-12-09 DIAGNOSIS — W228XXA Striking against or struck by other objects, initial encounter: Secondary | ICD-10-CM | POA: Diagnosis not present

## 2023-12-09 MED ORDER — NAPROXEN 500 MG PO TABS: 500.0000 mg | ORAL_TABLET | Freq: Two times a day (BID) | ORAL | 0 refills | Status: AC

## 2023-12-09 MED ORDER — NAPROXEN 500 MG PO TABS
500.0000 mg | ORAL_TABLET | Freq: Once | ORAL | Status: AC
  Administered 2023-12-09: 500 mg via ORAL
  Filled 2023-12-09: qty 1

## 2023-12-09 MED ORDER — ACETAMINOPHEN ER 650 MG PO TBCR: 650.0000 mg | EXTENDED_RELEASE_TABLET | Freq: Three times a day (TID) | ORAL | 0 refills | Status: AC | PRN

## 2023-12-09 MED ORDER — ACETAMINOPHEN 325 MG PO TABS
650.0000 mg | ORAL_TABLET | Freq: Once | ORAL | Status: AC
  Administered 2023-12-09: 650 mg via ORAL
  Filled 2023-12-09: qty 2

## 2023-12-09 NOTE — Discharge Instructions (Addendum)
 You were seen in the emergency room for knee pain.  Please take the medications prescribed. Icing aggressively over the next 24 hours will be very helpful.  Call the orthopedic doctor only if your symptoms are not getting better.  Consider buying the KNEE SLEEVE prior to heading back to work.
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
## 2024-01-23 ENCOUNTER — Ambulatory Visit (HOSPITAL_COMMUNITY)
Admission: RE | Admit: 2024-01-23 | Discharge: 2024-01-23 | Disposition: A | Discharge status: Home / Self Care | Payer: MEDICAID | Source: Ambulatory Visit | Attending: Vascular Surgery | Admitting: Vascular Surgery

## 2024-01-23 ENCOUNTER — Other Ambulatory Visit (HOSPITAL_COMMUNITY): Payer: Self-pay | Admitting: Orthopaedic Surgery

## 2024-01-23 DIAGNOSIS — R600 Localized edema: Secondary | ICD-10-CM | POA: Diagnosis present

## 2024-02-14 ENCOUNTER — Encounter (HOSPITAL_COMMUNITY): Payer: Self-pay

## 2024-02-14 ENCOUNTER — Other Ambulatory Visit: Payer: Self-pay

## 2024-02-14 ENCOUNTER — Emergency Department (HOSPITAL_COMMUNITY)
Admission: EM | Admit: 2024-02-14 | Discharge: 2024-02-14 | Disposition: A | Discharge status: Home / Self Care | Payer: MEDICAID | Attending: Emergency Medicine | Admitting: Emergency Medicine

## 2024-02-14 DIAGNOSIS — M5441 Lumbago with sciatica, right side: Secondary | ICD-10-CM | POA: Diagnosis not present

## 2024-02-14 DIAGNOSIS — M79661 Pain in right lower leg: Secondary | ICD-10-CM | POA: Diagnosis present

## 2024-02-14 DIAGNOSIS — M5431 Sciatica, right side: Secondary | ICD-10-CM

## 2024-02-14 DIAGNOSIS — M5442 Lumbago with sciatica, left side: Secondary | ICD-10-CM | POA: Insufficient documentation

## 2024-02-14 MED ORDER — KETOROLAC TROMETHAMINE 15 MG/ML IJ SOLN
15.0000 mg | Freq: Once | INTRAMUSCULAR | Status: AC
  Administered 2024-02-14: 15 mg via INTRAMUSCULAR

## 2024-02-14 MED ORDER — KETOROLAC TROMETHAMINE 15 MG/ML IJ SOLN
15.0000 mg | Freq: Once | INTRAMUSCULAR | Status: DC
  Filled 2024-02-14: qty 1

## 2024-02-14 MED ORDER — IBUPROFEN 600 MG PO TABS: 600.0000 mg | ORAL_TABLET | Freq: Four times a day (QID) | ORAL | 0 refills | Status: DC | PRN

## 2024-02-14 NOTE — ED Provider Notes (Signed)
 Stephanie [name] EMERGENCY DEPARTMENT AT [facility] Provider Note   CSN: [id number] Arrival date & time: 02/14/24  2952     History  Chief Complaint  Patient presents with   Leg Pain    Stephanie [name] is a 43 y.o. female who presents with primary complaint of bilateral leg pain which is noted to be primarily on the right leg.  She has pain that radiates from the right gluteus distally to the right calf.  She describes this pain as sharp stabbing pain that is intermittent and has been present for the last 3 to 4 days.  She has had similar pain on the left leg that she states has been present for several months.  She has no saddle anesthesia, denies any bowel or bladder incontinence, and is ambulatory without assistance at baseline.  Her primary issue today is the worsened pain when she presented for work this morning.  She has been taking over-the-counter remedies for pain including Tylenol , ibuprofen , and has been using topical lidocaine  patches.   Leg Pain Associated symptoms: back pain        Home Medications Prior to Admission medications   Medication Sig Start Date End Date Taking? Authorizing Provider  acetaminophen  (TYLENOL  8 HOUR) 650 MG CR tablet Take 1 tablet (650 mg total) by mouth every 8 (eight) hours as needed for pain or fever. 12/09/23   Deatra Face, MD  lisinopril  (ZESTRIL ) 10 MG tablet Take 1 tablet (10 mg total) by mouth daily. 04/25/22 04/25/23  Aram Knights, MD  metroNIDAZOLE  (FLAGYL ) 500 MG tablet Take 1 tablet (500 mg total) by mouth 2 (two) times daily. 05/19/23   Tommas Fragmin A, FNP  naproxen  (NAPROSYN ) 500 MG tablet Take 1 tablet (500 mg total) by mouth 2 (two) times daily. 12/09/23   Deatra Face, MD  omeprazole  (PRILOSEC) 40 MG capsule Take 1 capsule (40 mg total) by mouth daily. 10/25/23   Burnette, Jennifer M, PA-C      Allergies    Patient has no known allergies.    Review of Systems   Review of Systems  Musculoskeletal:  Positive for  back pain.  All other systems reviewed and are negative.   Physical Exam Updated Vital Signs BP (!) 153/108   Pulse (!) 104   Temp 98.4 F (36.9 C) (Oral)   Resp 19   Ht 5\' 7"  (1.702 m)   Wt 77 kg   SpO2 100%   BMI 26.59 kg/m  Physical Exam Vitals and nursing note reviewed.  Constitutional:      General: She is not in acute distress.    Appearance: Normal appearance.  HENT:     Head: Normocephalic and atraumatic.     Mouth/Throat:     Mouth: Mucous membranes are moist.     Pharynx: Oropharynx is clear.  Eyes:     Extraocular Movements: Extraocular movements intact.     Conjunctiva/sclera: Conjunctivae normal.     Pupils: Pupils are equal, round, and reactive to light.  Cardiovascular:     Rate and Rhythm: Normal rate and regular rhythm.     Pulses: Normal pulses.     Heart sounds: Normal heart sounds. No murmur heard.    No friction rub. No gallop.  Pulmonary:     Effort: Pulmonary effort is normal.     Breath sounds: Normal breath sounds.  Abdominal:     General: Abdomen is flat. Bowel sounds are normal.     Palpations: Abdomen is soft.  Musculoskeletal:  General: Normal range of motion.     Cervical back: Normal range of motion and neck supple.     Lumbar back: Negative right straight leg raise test and negative left straight leg raise test.     Right hip: Normal.     Left hip: Normal.     Right lower leg: Normal. No edema.     Left lower leg: Normal. No edema.  Skin:    General: Skin is warm and dry.     Capillary Refill: Capillary refill takes less than 2 seconds.  Neurological:     General: No focal deficit present.     Mental Status: She is alert and oriented to person, place, and time. Mental status is at baseline.     GCS: GCS eye subscore is 4. GCS verbal subscore is 5. GCS motor subscore is 6.     Sensory: Sensation is intact.     Motor: Motor function is intact.     Coordination: Coordination is intact.  Psychiatric:        Mood and Affect:  Mood normal.     ED Results / Procedures / Treatments   Labs (all labs ordered are listed, but only abnormal results are displayed) Labs Reviewed - No data to display  EKG None  Radiology No results found.  Procedures Procedures    Medications Ordered in ED Medications - No data to display  ED Course/ Medical Decision Making/ A&P                                 Medical Decision Making  Medical Decision Making:   Stephanie [name] is a 43 y.o. female who presented to the ED today with leg and lower back pain detailed above.     Complete initial physical exam performed, notably the patient  was alert and oriented in no apparent distress.  Straight leg test bilaterally is negative.  There is tenderness to palpation around the right greater trochanter and upper gluteus.   There is 5/5 strength of the lower extremities bilaterally.  No focal deficits are appreciated.  Reviewed and confirmed nursing documentation for past medical history, family history, social history.    Initial Assessment:   With the patient's presentation of lower back and radiating lower limb pain, most likely diagnosis is sciatica.   Initial Plan:  Manage pain initially with IM Toradol . Plan to manage outpatient with her primary care. Objective evaluation as below reviewed   Initial Study Results:     Radiology:  All images reviewed independently. Agree with radiology report at this time.   VAS US  LOWER EXTREMITY VENOUS (DVT) Result Date: 01/23/2024  Lower Venous DVT Study Patient Name:  Stephanie [name]  Date of Exam:   01/23/2024 Medical Rec #: [id number]         Accession #:    [id number] Date of Birth: [birth year]         Patient Gender: F Patient Age:   42 years Exam Location:  [address] Procedure:      VAS US  LOWER EXTREMITY VENOUS (DVT) Referring Phys: Lasandra Points --------------------------------------------------------------------------------  Indications: Swelling. Other Indications:  Patient had an ankle injury in 2024. Recently she has noted                    unilateral swelling in the distal left calf and ankle. Risk Factors: None identified. Comparison Study: 11/29/22 was negative for  deep or superficial thrombus. Performing Technologist: [employer] BS, RVT, RDCS  Examination Guidelines: A complete evaluation includes B-mode imaging, spectral Doppler, color Doppler, and power Doppler as needed of all accessible portions of each vessel. Bilateral testing is considered an integral part of a complete examination. Limited examinations for reoccurring indications may be performed as noted. The reflux portion of the exam is performed with the patient in reverse Trendelenburg.  +-----+---------------+---------+-----------+----------+--------------+ RIGHTCompressibilityPhasicitySpontaneityPropertiesThrombus Aging +-----+---------------+---------+-----------+----------+--------------+ CFV  Full           Yes      Yes                                 +-----+---------------+---------+-----------+----------+--------------+   +---------+---------------+---------+-----------+----------+--------------+ LEFT     CompressibilityPhasicitySpontaneityPropertiesThrombus Aging +---------+---------------+---------+-----------+----------+--------------+ CFV      Full           Yes      Yes                                 +---------+---------------+---------+-----------+----------+--------------+ SFJ      Full           Yes      Yes                                 +---------+---------------+---------+-----------+----------+--------------+ FV Prox  Full                                                        +---------+---------------+---------+-----------+----------+--------------+ FV Mid   Full           Yes      Yes                                 +---------+---------------+---------+-----------+----------+--------------+ FV DistalFull           Yes      Yes                                  +---------+---------------+---------+-----------+----------+--------------+ PFV      Full                                                        +---------+---------------+---------+-----------+----------+--------------+ POP      Full           Yes      Yes                                 +---------+---------------+---------+-----------+----------+--------------+ PTV      Full           Yes      Yes                                 +---------+---------------+---------+-----------+----------+--------------+ PERO  Full           Yes      Yes                                 +---------+---------------+---------+-----------+----------+--------------+ Gastroc  Full                                                        +---------+---------------+---------+-----------+----------+--------------+ GSV      Full           Yes      Yes                                 +---------+---------------+---------+-----------+----------+--------------+    Findings reported to [facility] at 10:50am.  Summary: LEFT: - There is no evidence of deep vein thrombosis in the lower extremity. - There is no evidence of superficial venous thrombosis.  - No cystic structure found in the popliteal fossa.  *See table(s) above for measurements and observations. Electronically signed by Irvin Mantel on 01/23/2024 at 5:01:58 PM.    Final      Reassessment and Plan:   As this patient does not have any saddle anesthesia, does not have any bowel or bladder incontinence, do not suspect that she has cauda equina syndrome.  Further with radiating pain from the right gluteus distally through the right lower extremity suspect this is due to sciatica.  Anticipate she will need advanced imaging to determine ultimate etiology of pain.  Will manage outpatient with oral ibuprofen  along with initial pain management with IM ketorolac  here in the ED.  She is to understand that she is to follow-up  with her primary care for continued management and ordering of advanced imaging.          Final Clinical Impression(s) / ED Diagnoses Final diagnoses:  None    Rx / DC Orders ED Discharge Orders     None         Juanetta Nordmann, PA 02/14/24 1610    Deatra Face, MD 02/15/24 1309

## 2024-02-14 NOTE — ED Triage Notes (Signed)
 Patient said she has bilateral leg pain that started yesterday. Pain starts in right calf and moves up to her right hip. Tried lidocaine  and epsom salt bath and said it helped a little bit. Denies falls.

## 2024-03-30 ENCOUNTER — Ambulatory Visit (HOSPITAL_COMMUNITY)
Admission: EM | Admit: 2024-03-30 | Discharge: 2024-03-30 | Disposition: A | Discharge status: Home / Self Care | Payer: MEDICAID | Attending: Psychiatry | Admitting: Psychiatry

## 2024-03-30 DIAGNOSIS — F4323 Adjustment disorder with mixed anxiety and depressed mood: Secondary | ICD-10-CM | POA: Diagnosis not present

## 2024-03-30 DIAGNOSIS — F319 Bipolar disorder, unspecified: Secondary | ICD-10-CM | POA: Diagnosis not present

## 2024-03-30 DIAGNOSIS — F411 Generalized anxiety disorder: Secondary | ICD-10-CM | POA: Diagnosis not present

## 2024-03-30 DIAGNOSIS — F3130 Bipolar disorder, current episode depressed, mild or moderate severity, unspecified: Secondary | ICD-10-CM

## 2024-03-30 NOTE — Discharge Instructions (Addendum)
 Please call the following places to schedule an appointment for medication management and therapy.  You can also come upstairs for outpatient therapy Monday through Friday before 6:45 a.m. to be seen; however, they see a very limited number of patients without an appointment. It is first come, first served.  Get help right away if: You have thoughts about hurting yourself or others. Get help right away if you feel like you may hurt yourself or others, or have thoughts about taking your own life. Go to your nearest emergency room or: Call 911. Call the National Suicide Prevention Lifeline at 228-357-0716 or 988 in the [place] This is open 24 hours a day. If you're a Veteran: Call 988 and press 1. This is open 24 hours a day. Text the [employer] at (725) 543-6230. Summary Mental health is not just the absence of mental illness. It involves understanding your emotions and behaviors, and taking steps to manage them in a healthy way. If you have symptoms of mental or emotional distress, get help from family, friends, a health care provider, or a mental health professional. Practice good mental health behaviors such as stress management skills, self-calming skills, exercise, healthy sleeping and eating, and supportive relationships. This information is not intended to replace advice given to you by your health care provider. Make sure you discuss any questions you have with your health care provider.  Education provided on the fact that if experiencing worsening of psychiatry symptoms including suicidal ideations, homicidal ideations, or having auditory/visual hallucinations, etc, to call 911, 988, come back to this location, or go to the nearest ER. Pt verbalized understanding.

## 2024-03-30 NOTE — Progress Notes (Addendum)
   03/30/24 1240  [facility] Triage Screening (Walk-ins at [facility] only)  How Did You Hear About Us ? Self  What Is the Reason for Your Visit/Call Today? Patient is a 43 y.o. female with a history of Bipolar, maybe Schizoprenia who presents seeking evaluation feeling she had a break down over he weekend.  Patient states her work has been the primary stressor, feeling it's a toxic work environment.  She works in [employer] at [employer].  Patient states she has recently begun to feel the staff are all watching me.  She describes other staff as younger and maybe immature.  Patient states she had a restful weekend, until she considered going back into that work environment.  She began to question, Maybe it's me and I'm not thinking clearly about things.  Patient denies SI, HI and AVH.  She does report intrusive thoughts regarding individuals at work.  For instance, she has thoughts that tell me to cuss them out, because you know you can.  Patient states she needs to be evaluated and she is open to trying medications if recommended.  She is also open to inpatient treatment if recommended.  She denies SA hx.  How Long Has This Been Causing You Problems? > than 6 months  Have You Recently Had Any Thoughts About Hurting Yourself? No  Are You Planning to Commit Suicide/Harm Yourself At This time? No  Have you Recently Had Thoughts About Hurting Someone [name]? No  Are You Planning To Harm Someone At This Time? No  Physical Abuse Denies  Verbal Abuse Denies  Sexual Abuse Denies  Exploitation of patient/patient's resources Denies  Self-Neglect Denies  Possible abuse reported to:  (N/A)  Are you currently experiencing any auditory, visual or other hallucinations? No  Have You Used Any Alcohol or Drugs in the Past 24 Hours? No  Do you have any current medical co-morbidities that require immediate attention? No  Clinician description of patient physical appearance/behavior: Patient is somewhat hyperverbal,  displaying some mood instability and incongruent mood. She is AAOx5.  What Do You Feel Would Help You the Most Today? Treatment for Depression or other mood problem  If access to [facility] was not available, would you have sought care in the Emergency Department? No  Determination of Need Urgent (48 hours)  Options For Referral Medication Management;Outpatient Therapy;Inpatient Hospitalization;BH Urgent Care  Determination of Need filed? Yes
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
## 2024-04-01 ENCOUNTER — Ambulatory Visit (INDEPENDENT_AMBULATORY_CARE_PROVIDER_SITE_OTHER): Payer: MEDICAID | Admitting: Physician Assistant

## 2024-04-01 VITALS — BP 129/89 | HR 62 | Temp 98.8°F | Ht 66.0 in | Wt 166.2 lb

## 2024-04-01 DIAGNOSIS — F4310 Post-traumatic stress disorder, unspecified: Secondary | ICD-10-CM | POA: Diagnosis not present

## 2024-04-01 DIAGNOSIS — F3132 Bipolar disorder, current episode depressed, moderate: Secondary | ICD-10-CM | POA: Diagnosis not present

## 2024-04-01 DIAGNOSIS — F411 Generalized anxiety disorder: Secondary | ICD-10-CM

## 2024-04-01 DIAGNOSIS — F4323 Adjustment disorder with mixed anxiety and depressed mood: Secondary | ICD-10-CM

## 2024-04-01 MED ORDER — QUETIAPINE FUMARATE 100 MG PO TABS
100.0000 mg | ORAL_TABLET | Freq: Every day | ORAL | 1 refills | Status: DC
Start: 2024-04-01 — End: 2024-04-15

## 2024-04-01 MED ORDER — QUETIAPINE FUMARATE 50 MG PO TABS: ORAL_TABLET | ORAL | 0 refills | Status: DC

## 2024-04-01 MED ORDER — BUSPIRONE HCL 7.5 MG PO TABS: 7.5000 mg | ORAL_TABLET | Freq: Two times a day (BID) | ORAL | 1 refills | Status: DC
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
## 2024-04-02 ENCOUNTER — Telehealth (HOSPITAL_COMMUNITY): Payer: Self-pay

## 2024-04-02 NOTE — Telephone Encounter (Signed)
 Medication management - Patient called, stating she took her newly prescribed Seroquel  the previous night around 8:30 pm and had take a Buspirone  earlier in the evening, Patient stated she was feeling strange today and still tired and was having problems with work so had to leave there.  Patient stated she was not sure if this was due to the medication or something at work triggering her difficulties but felt strange and could not manage.  Patient would like a call back to discuss her medications and if could be related to the combination.  Informed Seroquel  could make her tired but would pass on the request to Lytle Bolster, PA for a call back.

## 2024-04-04 ENCOUNTER — Encounter (HOSPITAL_COMMUNITY): Payer: Self-pay | Admitting: Physician Assistant

## 2024-04-06 ENCOUNTER — Telehealth: Payer: MEDICAID | Admitting: Physician Assistant

## 2024-04-06 ENCOUNTER — Telehealth: Payer: MEDICAID

## 2024-04-06 DIAGNOSIS — R399 Unspecified symptoms and signs involving the genitourinary system: Secondary | ICD-10-CM | POA: Diagnosis not present

## 2024-04-06 MED ORDER — NITROFURANTOIN MONOHYD MACRO 100 MG PO CAPS: 100.0000 mg | ORAL_CAPSULE | Freq: Two times a day (BID) | ORAL | 0 refills | Status: AC

## 2024-04-06 NOTE — Patient Instructions (Signed)
 [name], thank you for joining Harlene PEDLAR Ward, PA-C for today's virtual visit.  While this provider is not your primary care provider (PCP), if your PCP is located in our provider database this encounter information will be shared with them immediately following your visit.   A [facility] MyChart account gives you access to today's visit and all your visits, tests, and labs performed at [facility]  click here if you don't have a [facility] MyChart account or go to [email or web address]  Consent: (Patient) Stephanie [name] provided verbal consent for this virtual visit at the beginning of the encounter.  Current Medications:  Current Outpatient Medications:    nitrofurantoin , macrocrystal-monohydrate, (MACROBID ) 100 MG capsule, Take 1 capsule (100 mg total) by mouth 2 (two) times daily for 5 days., Disp: 10 capsule, Rfl: 0   acetaminophen  (TYLENOL  8 HOUR) 650 MG CR tablet, Take 1 tablet (650 mg total) by mouth every 8 (eight) hours as needed for pain or fever., Disp: 30 tablet, Rfl: 0   busPIRone  (BUSPAR ) 7.5 MG tablet, Take 1 tablet (7.5 mg total) by mouth 2 (two) times daily., Disp: 60 tablet, Rfl: 1   ibuprofen  (ADVIL ) 600 MG tablet, Take 1 tablet (600 mg total) by mouth every 6 (six) hours as needed., Disp: 30 tablet, Rfl: 0   lisinopril  (ZESTRIL ) 10 MG tablet, Take 1 tablet (10 mg total) by mouth daily., Disp: 30 tablet, Rfl: 11   metroNIDAZOLE  (FLAGYL ) 500 MG tablet, Take 1 tablet (500 mg total) by mouth 2 (two) times daily., Disp: 14 tablet, Rfl: 0   naproxen  (NAPROSYN ) 500 MG tablet, Take 1 tablet (500 mg total) by mouth 2 (two) times daily., Disp: 20 tablet, Rfl: 0   omeprazole  (PRILOSEC) 40 MG capsule, Take 1 capsule (40 mg total) by mouth daily., Disp: 30 capsule, Rfl: 0   QUEtiapine  (SEROQUEL ) 100 MG tablet, Take 1 tablet (100 mg total) by mouth at bedtime., Disp: 30 tablet, Rfl: 1   QUEtiapine  (SEROQUEL ) 50 MG tablet, Patient to take Seroquel  50 mg at  bedtime for 6 days before increasing to 100 mg at bedtime, Disp: 6 tablet, Rfl: 0   Medications ordered in this encounter:  Meds ordered this encounter  Medications   nitrofurantoin , macrocrystal-monohydrate, (MACROBID ) 100 MG capsule    Sig: Take 1 capsule (100 mg total) by mouth 2 (two) times daily for 5 days.    Dispense:  10 capsule    Refill:  0    Supervising Provider:   BLAISE ALEENE KIDD [[id number]]     *If you need refills on other medications prior to your next appointment, please contact your pharmacy*  Follow-Up: Call back or seek an in-person evaluation if the symptoms worsen or if the condition fails to improve as anticipated.  [facility] [phone]  Other Instructions Take antibiotic as prescribed. Make sure you are drinking plenty of fluids.  If no improvement recommend follow up in person with Primary Care Physician or Urgent Care.    If you have been instructed to have an in-person evaluation today at a local Urgent Care facility, please use the link below. It will take you to a list of all of our available [facility], including address, phone number and hours of operation. Please do not delay care.  [facility]  If you or a family member do not have a primary care provider, use the link below to schedule a visit and establish care. When you choose  a [facility] primary care physician or advanced practice provider, you gain a long-term partner in health. Find a Primary Care Provider  Learn more about [facility]'s in-office and virtual care options: [facility] - Get Care Now

## 2024-04-06 NOTE — Progress Notes (Signed)
 Virtual Visit Consent   Stephanie [name], you are scheduled for a virtual visit with a [facility] provider today. Just as with appointments in the office, your consent must be obtained to participate. Your consent will be active for this visit and any virtual visit you may have with one of our providers in the next 365 days. If you have a MyChart account, a copy of this consent can be sent to you electronically.  As this is a virtual visit, video technology does not allow for your provider to perform a traditional examination. This may limit your provider's ability to fully assess your condition. If your provider identifies any concerns that need to be evaluated in person or the need to arrange testing (such as labs, EKG, etc.), we will make arrangements to do so. Although advances in technology are sophisticated, we cannot ensure that it will always work on either your end or our end. If the connection with a video visit is poor, the visit may have to be switched to a telephone visit. With either a video or telephone visit, we are not always able to ensure that we have a secure connection.  By engaging in this virtual visit, you consent to the provision of healthcare and authorize for your insurance to be billed (if applicable) for the services provided during this visit. Depending on your insurance coverage, you may receive a charge related to this service.  I need to obtain your verbal consent now. Are you willing to proceed with your visit today? Stephanie [name] has provided verbal consent on 04/06/2024 for a virtual visit (video or telephone). Harlene PEDLAR Ward, PA-C  Date: 04/06/2024 7:03 PM   Virtual Visit via Video Note   I, Harlene PEDLAR Ward, connected with  Stephanie [name]  ([id number], 09/01/1981) on 04/06/24 at  7:00 PM EDT by a video-enabled telemedicine application and verified that I am speaking with the correct person using two identifiers.  Location: Patient: Virtual Visit Location  Patient: Home Provider: Virtual Visit Location Provider: Home Office   I discussed the limitations of evaluation and management by telemedicine and the availability of in person appointments. The patient expressed understanding and agreed to proceed.    History of Present Illness: Stephanie [name] is a 43 y.o. who identifies as a female who was assigned female at birth, and is being seen today for frequent urination and lower back pain that started about 2-3 days ago.  Reports mild dysuria and feeling like she can't empty her bladder fully.  She reports mild discharge.  Mild abdominal pain.  Denies fever or chills.  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 02/26/2018 02/26/2018   External hemorrhoids status post hemorrhoidectomy x2, 02/26/2018 02/26/2018   Hypokalemia 02/25/2018   Hypomagnesemia 02/25/2018   Pelvic pain 02/21/2018   Schizophrenia (HCC)    Panic attacks    Hypertension    PTSD (post-traumatic stress disorder)    Constipation, chronic 02/20/2018   Sweaty palms 10/25/2017   Encounter to establish care 10/25/2017   Depression 10/25/2017   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   Prolapsed internal hemorrhoids, grade 2&3,  s/p hemorrhoidal ligation/pexy 02/26/2018 05/01/2013    Allergies: No Known Allergies Medications:  Current Outpatient Medications:    nitrofurantoin , macrocrystal-monohydrate, (MACROBID ) 100 MG capsule, Take 1 capsule (100 mg total) by mouth 2 (two) times daily for 5 days., Disp: 10 capsule, Rfl: 0   acetaminophen  (TYLENOL  8 HOUR) 650 MG CR  tablet, Take 1 tablet (650 mg total) by mouth every 8 (eight) hours as needed for pain or fever., Disp: 30 tablet, Rfl: 0   busPIRone  (BUSPAR ) 7.5 MG tablet, Take 1 tablet (7.5 mg total) by mouth 2 (two) times daily., Disp: 60 tablet, Rfl: 1   ibuprofen  (ADVIL ) 600 MG tablet, Take 1 tablet (600 mg total) by mouth every 6 (six) hours as needed., Disp: 30 tablet, Rfl: 0   lisinopril  (ZESTRIL ) 10 MG tablet, Take 1 tablet (10 mg total) by mouth daily., Disp: 30 tablet, Rfl: 11   metroNIDAZOLE  (FLAGYL ) 500 MG tablet, Take 1 tablet (500 mg total) by mouth 2 (two) times daily., Disp: 14 tablet, Rfl: 0   naproxen  (NAPROSYN ) 500 MG tablet, Take 1 tablet (500 mg total) by mouth 2 (two) times daily., Disp: 20 tablet, Rfl: 0   omeprazole  (PRILOSEC) 40 MG capsule, Take 1 capsule (40 mg total) by mouth daily., Disp: 30 capsule, Rfl: 0   QUEtiapine  (SEROQUEL ) 100 MG tablet, Take 1 tablet (100 mg total) by mouth at bedtime., Disp: 30 tablet, Rfl: 1   QUEtiapine  (SEROQUEL ) 50 MG tablet, Patient to take Seroquel  50 mg at bedtime for 6 days before increasing to 100 mg at bedtime, Disp: 6 tablet, Rfl: 0  Observations/Objective: Patient is well-developed, well-nourished in no acute distress.  Resting comfortably at home.  Head is normocephalic, atraumatic.  No labored breathing.  Speech is clear and coherent with logical content.  Patient is alert and oriented at baseline.    Assessment and Plan: 1. UTI symptoms (Primary)  Will treat for suspected UTI.  Advised in person follow up if no improvement.   Follow Up Instructions: I discussed the assessment and treatment plan with the patient. The patient was provided an opportunity to ask questions and all were answered. The patient agreed with the plan and demonstrated an understanding of the instructions.  A copy of instructions were sent to the patient via MyChart unless otherwise noted below.     The patient was advised to call back or seek an in-person  evaluation if the symptoms worsen or if the condition fails to improve as anticipated.    Harlene PEDLAR Ward, PA-C

## 2024-04-14 ENCOUNTER — Telehealth (HOSPITAL_COMMUNITY): Payer: Self-pay

## 2024-04-14 NOTE — Telephone Encounter (Signed)
 Patient called in requesting a possible medication change and advice from provider. Patient called in stated the Seroquel  50 mg leaves her tired even if she takes it at 9 pm. Patient reports waking up still feeling drowsy or heaviness. Patient is stating she feels uncomfortable starting the Seroquel  100 mg.  Patient continued to express her Buspar  7.5 mg is no longer working. Patient states she has returned to her old self and old symptoms. Patient feels like she is going crazy and she is unhappy. Patient expressed feeling overwhelmed at her job.   Patient work hours are 7a-7p, is available around 10:30 am and is content [employer].

## 2024-04-15 ENCOUNTER — Other Ambulatory Visit (HOSPITAL_COMMUNITY): Payer: Self-pay | Admitting: Physician Assistant

## 2024-04-15 DIAGNOSIS — F411 Generalized anxiety disorder: Secondary | ICD-10-CM

## 2024-04-15 DIAGNOSIS — F3132 Bipolar disorder, current episode depressed, moderate: Secondary | ICD-10-CM

## 2024-04-15 MED ORDER — BUSPIRONE HCL 10 MG PO TABS: 10.0000 mg | ORAL_TABLET | Freq: Two times a day (BID) | ORAL | 1 refills | Status: DC

## 2024-04-15 MED ORDER — ARIPIPRAZOLE 5 MG PO TABS: 5.0000 mg | ORAL_TABLET | Freq: Every day | ORAL | 1 refills | Status: DC

## 2024-04-15 MED ORDER — HYDROXYZINE HCL 10 MG PO TABS: 10.0000 mg | ORAL_TABLET | Freq: Three times a day (TID) | ORAL | 1 refills | Status: DC | PRN

## 2024-04-15 NOTE — Telephone Encounter (Signed)
 Message acknowledged and reviewed. Provider was able to reach out to patient. See note.

## 2024-04-15 NOTE — Progress Notes (Signed)
 Provider reached out to patient regarding concerns over her medications.  Patient informed provider that she has been struggling at work and states that her medications have not been helpful in managing the stress.  She also reports that her use of Seroquel  caused grogginess and fatigue the following morning after taking the medication at night.  She reports that she did not increase her Seroquel  dosage to 100 mg at bedtime due to the grogginess that she has been experiencing.  Patient also notes that she continues to experience anxiety accompanied by nervousness and shaking.  She reports that her buspirone  was helpful in the beginning but states that she has noticed herself becoming more nervous at work.  Patient is unsure if her symptoms are attributed to her mental illness or the work environment that she is currently working in.  Patient notes that she will occasionally walk off from her post at work due to the gossiping that is happening around her.  Patient also notes that she feels as though her coworkers may be talking about her.  Patient would like to discontinue her use of Seroquel  due to the grogginess that the medication causes.  Provider recommended discontinuing Seroquel  and starting Abilify  5 mg daily for mood stability.  Patient was also recommended increasing her buspirone  dosage from 7.5 to 10 mg 2 times daily for the management of her anxiety.  Lastly, provider recommended patient be placed on hydroxyzine  10 mg 3 times daily as needed for the management of her anxiety.  Patient was agreeable to recommendations.  Patient's medications to be e-prescribed to pharmacy of choice.

## 2024-04-18 ENCOUNTER — Ambulatory Visit (HOSPITAL_COMMUNITY)
Admission: EM | Admit: 2024-04-18 | Discharge: 2024-04-18 | Disposition: A | Discharge status: Home / Self Care | Payer: MEDICAID | Attending: Behavioral Health | Admitting: Behavioral Health

## 2024-04-18 DIAGNOSIS — F439 Reaction to severe stress, unspecified: Secondary | ICD-10-CM | POA: Insufficient documentation

## 2024-04-18 DIAGNOSIS — F430 Acute stress reaction: Secondary | ICD-10-CM

## 2024-04-18 NOTE — Progress Notes (Signed)
   04/18/24 1148  [facility] Triage Screening (Walk-ins at [facility] only)  How Did You Hear About Us ? Self  What Is the Reason for Your Visit/Call Today? 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 Patient denies any SI, HI or AVH. Patient has a PMHx significant for GAD, PTSD, Bipolar Depression and adjustment disorder. 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 Patient was seen twice before when she presented with similar symptoms on 03/30/2024 (see note from that date) and has been receiving medication management from [facility] (see MAR). Patient reports current medication compliance although feels they are not working as indicated stating she still feels, nervous all the time. Patient is requesting a provider to review her medications and make suggestions to decrease her anxiety.  How Long Has This Been Causing You Problems? 1-6 months  Have You Recently Had Any Thoughts About Hurting Yourself? No  Are You Planning to Commit Suicide/Harm Yourself At This time? No  Have you Recently Had Thoughts About Hurting Someone [name]? No  Are You Planning To Harm Someone At This Time? No  Physical Abuse Denies  Verbal Abuse Denies  Sexual Abuse Denies  Exploitation of patient/patient's resources Denies  Self-Neglect Denies  Possible abuse reported to: Other (Comment) (NA)  Are you currently experiencing any auditory, visual or other hallucinations? No   Have You Used Any Alcohol or Drugs in the Past 24 Hours? No  Do you have any current medical co-morbidities that require immediate attention? No  Clinician description of patient physical appearance/behavior: Patient presents with a depressed affect  What Do You Feel Would Help You the Most Today? Medication(s)  If access to [facility] was not available, would you have sought care in the Emergency Department? No  Determination of Need Routine (7 days)  Options For Referral Outpatient Therapy

## 2024-04-18 NOTE — Discharge Instructions (Addendum)
 Discharge recommendations:   Medications: Patient is to take medications as prescribed. No medication changes were made during your visit. The patient or patient's guardian is to contact a medical professional and/or outpatient provider to address any new side effects that develop. The patient or the patient's guardian should update outpatient providers of any new medications and/or medication changes.   Outpatient Follow up:  You are encouraged to follow up with [name] for outpatient treatment.  Your next follow up appointment is scheduled for May 08, 2024 at 9:00 am with Dr. Kapoor  Your therapy appointment is scheduled for June 12, 2024 at 10:00 am with [name]  Walk in/ Open Access Hours: Monday - Friday 8AM - 11AM   [name] [address] [place], [place] [phone]  Therapy: We recommend that patient participate in individual therapy to address mental health concerns.  Please contact [facility] to schedule a new patient appointment for counseling services.   [facility]  Located in: [place] building Address: [address], [place], [place] [address] Phone: [phone]  Atypical antipsychotics: If you are prescribed an atypical antipsychotic, it is recommended that your height, weight, BMI, blood pressure, fasting lipid panel, and fasting blood sugar be monitored by your outpatient providers.  Safety:   The following safety precautions should be taken:   No sharp objects. This includes scissors, razors, scrapers, and putty knives.   Chemicals should be removed and locked up.   Medications should be removed and locked up.   Weapons should be removed and locked up. This includes firearms, knives and instruments that can be used to cause injury.   The patient should abstain from use of illicit substances/drugs and abuse of any medications.  If symptoms worsen or do not continue  to improve or if the patient becomes actively suicidal or homicidal then it is recommended that the patient return to the closest [name] emergency department, the [facility], or call 911 for further evaluation and treatment. National Suicide Prevention Lifeline 1-800-SUICIDE or (845)738-5298.  About 988 988 offers 24/7 access to trained crisis counselors who can help people experiencing mental health-related distress. People can call or text 988 or chat 988lifeline.org for themselves or if they are worried about a loved one who may need crisis support.   Mindfulness-Based Stress Reduction Mindfulness-based stress reduction (MBSR) is a program that helps people learn to practice mindfulness. Mindfulness is the practice of consciously paying attention to the present moment. MBSR focuses on developing self-awareness, which lets you respond to life stress without judgment or negative feelings. It can be learned and practiced through techniques such as education, breathing exercises, meditation, and yoga. MBSR includes several mindfulness techniques in one program. MBSR works best when you understand the treatment, are willing to try new things, and can commit to spending time practicing what you learn. MBSR training may include learning about: How your feelings, thoughts, and reactions affect your body. New ways to respond to things that cause negative thoughts to start (triggers). How to notice your thoughts and let go of them. Practicing awareness of everyday things that you normally do without thinking. The techniques and goals of different types of meditation. What are the benefits of MBSR? MBSR can have many benefits, which include helping you to: Develop self-awareness. This means knowing and understanding yourself. Learn skills and attitudes that help you to take part in your own health care. Learn new ways to care  for yourself. Be more accepting about how  things are, and let things go. Be less judgmental and approach things with an open mind. Be patient with yourself and trust yourself more. MBSR has also been shown to: Reduce negative emotions, such as sadness, overwhelm, and worry. Improve memory and focus. Change how you sense and react to pain. Boost your body's ability to fight infections. Help you connect better with other people. Improve your sense of well-being. How to practice mindfulness To do a basic awareness exercise: Find a comfortable place to sit. Pay attention to the present moment. Notice your thoughts, feelings, and surroundings just as they are. Avoid judging yourself, your feelings, or your surroundings. Make note of any judgment that comes up and let it go. Your mind may wander, and that is okay. Make note of when your thoughts drift, and return your attention to the present moment. To do basic mindfulness meditation: Find a comfortable place to sit. This may include a stable chair or a firm floor cushion. Sit upright with your back straight. Let your arms fall next to your sides, with your hands resting on your legs. If you are sitting in a chair, rest your feet flat on the floor. If you are sitting on a cushion, cross your legs in front of you. Keep your head in a neutral position with your chin dropped slightly. Relax your jaw and rest the tip of your tongue on the roof of your mouth. Drop your gaze to the floor or close your eyes. Breathe normally and pay attention to your breath. Feel the air moving in and out of your nose. Feel your belly expanding and relaxing with each breath. Your mind may wander, and that is okay. Make note of when your thoughts drift, and return your attention to your breath. Avoid judging yourself, your feelings, or your surroundings. Make note of any judgment or feelings that come up, let them go, and bring your attention back to your breath. When you are ready, lift your gaze or open your  eyes. Pay attention to how your body feels after the meditation. Follow these instructions at home:  Find a local in-person or online MBSR program. Set aside some time regularly for mindfulness practice. Practice every day if you can. Even 10 minutes of practice is helpful. Find a mindfulness practice that works best for you. This may include one or more of the following: Meditation. This involves focusing your mind on a certain thought or activity. Breathing awareness exercises. These help you to stay present by focusing on your breath. Body scan. For this practice, you lie down and pay attention to each part of your body from head to toe. You can identify tension and soreness and consciously relax parts of your body. Yoga. Yoga involves stretching and breathing, and it can improve your ability to move and be flexible. It can also help you to test your body's limits, which can help you release stress. Mindful eating. This way of eating involves focusing on the taste, texture, color, and smell of each bite of food. This slows down eating and helps you feel full sooner. For this reason, it can be an important part of a weight loss plan. Find a podcast or recording that provides guidance for breathing awareness, body scan, or meditation exercises. You can listen to these any time when you have a free moment to rest without distractions. Follow your treatment plan as told by your health care provider. This may include taking  regular medicines and making changes to your diet or lifestyle as recommended. Where to find more information You can find more information about MBSR from: Your health care provider. Community-based meditation centers or programs. Programs offered near you. Summary Mindfulness-based stress reduction (MBSR) is a program that teaches you how to consciously pay attention to the present moment. It is used to help you deal better with daily stress, feelings, and pain. MBSR focuses  on developing self-awareness, which allows you to respond to life stress without judgment or negative feelings. MBSR programs may involve learning different mindfulness practices, such as breathing exercises, meditation, yoga, body scan, or mindful eating. Find a mindfulness practice that works best for you, and set aside time for it on a regular basis. This information is not intended to replace advice given to you by your health care provider. Make sure you discuss any questions you have with your health care provider.

## 2024-04-18 NOTE — ED Provider Notes (Signed)
 Behavioral Health Urgent Care Medical Screening Exam  Patient Name: Stephanie [name] MRN: [id number] Date of Evaluation: 04/18/24 Diagnosis:  Final diagnoses:  Stress reaction    History of Present illness: Stephanie [name] is a 43 y.o. female patient with a documented psychiatric history significant for depression, schizophrenia, panic attacks, PTSD, GAD, bipolar affective disorder and adjustment disorder with mixed anxiety and depressed mood who presented to the [facility] voluntary unaccompanied with complaints of I went to work and started shaking and crying.   On evaluation, patient is alert and oriented x 4. Her thought process is linear. Thought content is negative for SI/HI/AVH. Objectively, no signs of acute psychosis. Her mood is dysphoric and affect is tearful. Her speech is pressured at an increased tone. She has fair eye contact. She is casually dressed in work clothes. She is cooperative and does not appear to be in acute distress.   Patient states that she has been working at the [employer] since August 05, 2023 and identifies the environment as toxic. 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 She states that the coworkers complain about her staring at them and they refuse to talk to her. She states that she also struggles with the slow  pace work environment and is used to working in [employer]. She states that yesterday she had an issue with her coworkers because they felt like she was staring at them and watching them. She states that everything has been building up and what happened yesterday crossed over to today and when she got to work today she started shaking, and crying and left work. She states that she does not know if she is going back and that she feels like she has enough evidence to resign and take her medical records to HR to prove that the job is affecting her mental health  or she may quit her job and file for unemployment.   Patient states that she resides with her 5 kids. She reports smoking weed daily whenever she can get it. She reports drinking alcohol socially. She is established here at the [facility] and is currently prescribed Abilify  5 mg daily, BuSpar  10 mg twice daily and Vistaril  10 mg as needed. She reports medication compliance and denies medication side effects. She states that she has not started the Vistaril  because the pharmacy did not have it available. Her next appointment for medication management scheduled for 8/15 and she has a new patient appointment for therapy on 9/19.  Plan: Patient encouraged to follow up with [facility] for outpatient treatment. Her next follow up appointment is scheduled for May 08, 2024 at 9:00 am with Dr. Kapoor for medication management and therapy appointment is scheduled for June 12, 2024 at 10:00 am with [place], [place]. Patient was given additional resources for therapy if she would like to start therapy soon and was advised to contact [facility]rat to schedule a new patient appointment for counseling services. Patient to continue taking medications as prescribed by outpatient provider. Will provide patient with a work note to return back to work on Tuesday, 04/21/24 to allow time to establish additional counseling.    Flowsheet Row ED from 04/18/2024 in [facility] Office Visit from 04/01/2024 in [facility] ED from 02/14/2024 in [facility] Emergency Department at [facility]  C-SSRS RISK CATEGORY No Risk No Risk  No Risk    Psychiatric Specialty Exam  Presentation  General Appearance:Appropriate for Environment  Eye Contact:Fair  Speech:Pressured; Clear and Coherent  Speech Volume:Normal  Handedness:Right   Mood and Affect  Mood: Depressed  Affect: Congruent;  Tearful   Thought Process  Thought Processes: Coherent  Descriptions of Associations:Intact  Orientation:Full (Time, Place and Person)  Thought Content:Logical  Diagnosis of Schizophrenia or Schizoaffective disorder in past: Yes  Duration of Psychotic Symptoms: Greater than six months  Hallucinations:None  Ideas of Reference:None  Suicidal Thoughts:No  Homicidal Thoughts:No   Sensorium  Memory: Immediate Fair; Recent Fair; Remote Fair  Judgment: Fair  Insight: Fair   Art therapist  Concentration: Fair  Attention Span: Fair  Recall: [employer] of Knowledge: Fair  Language: Fair   Psychomotor Activity  Psychomotor Activity: Normal   Assets  Assets: Manufacturing systems engineer; Desire for Improvement; Financial Resources/Insurance; Housing; Leisure Time; Physical Health; Resilience   Sleep  Sleep: Poor    Physical Exam: Physical Exam Eyes:     Conjunctiva/sclera: Conjunctivae normal.  Cardiovascular:     Rate and Rhythm: Normal rate.  Pulmonary:     Effort: Pulmonary effort is normal.  Musculoskeletal:        General: Normal range of motion.     Cervical back: Normal range of motion.  Neurological:     Mental Status: She is alert and oriented to person, place, and time.    Review of Systems  Constitutional: Negative.   HENT: Negative.    Eyes: Negative.   Respiratory: Negative.    Cardiovascular: Negative.   Gastrointestinal: Negative.   Genitourinary: Negative.   Musculoskeletal: Negative.   Neurological: Negative.   Endo/Heme/Allergies: Negative.    Blood pressure (!) 137/95, pulse 80, temperature 98.5 F (36.9 C), temperature source Oral, resp. rate 20, SpO2 100%. There is no height or weight on file to calculate BMI.  Musculoskeletal: Strength & Muscle Tone: within normal limits Gait & Station: normal Patient leans: N/A   [facility] MSE Discharge Disposition for Follow up and Recommendations: Based on my evaluation the  patient does not appear to have an emergency medical condition and can be discharged with resources and follow up care in outpatient services for Medication Management and Individual Therapy  Discharge recommendations:   Medications: Patient is to take medications as prescribed. No medication changes were made during your visit. The patient or patient's guardian is to contact a medical professional and/or outpatient provider to address any new side effects that develop. The patient or the patient's guardian should update outpatient providers of any new medications and/or medication changes.   Outpatient Follow up:  You are encouraged to follow up with [facility] for outpatient treatment.  Your next follow up appointment is scheduled for May 08, 2024 at 9:00 am with Dr. Kapoor  Your therapy appointment is scheduled for June 12, 2024 at 10:00 am with [name]  Walk in/ Open Access Hours: Monday - Friday 8AM - 11AM   [facility] [address] [place], [place] [phone]  Therapy: We recommend that patient participate in individual therapy to address mental health concerns.  Please contact [facility] to schedule a new patient appointment for counseling services.   [facility]  Located in: [place] building Address: [address], [place], [place] [address] Phone: [phone]  Atypical antipsychotics: If you are prescribed an atypical antipsychotic, it is recommended that your height, weight, BMI, blood pressure, fasting lipid panel, and  fasting blood sugar be monitored by your outpatient providers.  Safety:   The following safety precautions should be taken:   No sharp objects. This includes scissors, razors, scrapers, and putty knives.   Chemicals should be removed and locked up.   Medications should be removed and locked up.   Weapons should be removed and locked up. This includes firearms,  knives and instruments that can be used to cause injury.   The patient should abstain from use of illicit substances/drugs and abuse of any medications.  If symptoms worsen or do not continue to improve or if the patient becomes actively suicidal or homicidal then it is recommended that the patient return to the closest hospital emergency department, the [facility], or call 911 for further evaluation and treatment. National Suicide Prevention Lifeline 1-800-SUICIDE or 9891011629.  About 988 988 offers 24/7 access to trained crisis counselors who can help people experiencing mental health-related distress. People can call or text 988 or chat 988lifeline.org for themselves or if they are worried about a loved one who may need crisis support.   Mindfulness-Based Stress Reduction Mindfulness-based stress reduction (MBSR) is a program that helps people learn to practice mindfulness. Mindfulness is the practice of consciously paying attention to the present moment. MBSR focuses on developing self-awareness, which lets you respond to life stress without judgment or negative feelings. It can be learned and practiced through techniques such as education, breathing exercises, meditation, and yoga. MBSR includes several mindfulness techniques in one program. MBSR works best when you understand the treatment, are willing to try new things, and can commit to spending time practicing what you learn. MBSR training may include learning about: How your feelings, thoughts, and reactions affect your body. New ways to respond to things that cause negative thoughts to start (triggers). How to notice your thoughts and let go of them. Practicing awareness of everyday things that you normally do without thinking. The techniques and goals of different types of meditation. What are the benefits of MBSR? MBSR can have many benefits, which include helping you to: Develop self-awareness.  This means knowing and understanding yourself. Learn skills and attitudes that help you to take part in your own health care. Learn new ways to care for yourself. Be more accepting about how things are, and let things go. Be less judgmental and approach things with an open mind. Be patient with yourself and trust yourself more. MBSR has also been shown to: Reduce negative emotions, such as sadness, overwhelm, and worry. Improve memory and focus. Change how you sense and react to pain. Boost your body's ability to fight infections. Help you connect better with other people. Improve your sense of well-being. How to practice mindfulness To do a basic awareness exercise: Find a comfortable place to sit. Pay attention to the present moment. Notice your thoughts, feelings, and surroundings just as they are. Avoid judging yourself, your feelings, or your surroundings. Make note of any judgment that comes up and let it go. Your mind may wander, and that is okay. Make note of when your thoughts drift, and return your attention to the present moment. To do basic mindfulness meditation: Find a comfortable place to sit. This may include a stable chair or a firm floor cushion. Sit upright with your back straight. Let your arms fall next to your sides, with your hands resting on your legs. If you are sitting in a chair, rest your feet flat on the floor. If you are sitting  on a cushion, cross your legs in front of you. Keep your head in a neutral position with your chin dropped slightly. Relax your jaw and rest the tip of your tongue on the roof of your mouth. Drop your gaze to the floor or close your eyes. Breathe normally and pay attention to your breath. Feel the air moving in and out of your nose. Feel your belly expanding and relaxing with each breath. Your mind may wander, and that is okay. Make note of when your thoughts drift, and return your attention to your breath. Avoid judging yourself, your  feelings, or your surroundings. Make note of any judgment or feelings that come up, let them go, and bring your attention back to your breath. When you are ready, lift your gaze or open your eyes. Pay attention to how your body feels after the meditation. Follow these instructions at home:  Find a local in-person or online MBSR program. Set aside some time regularly for mindfulness practice. Practice every day if you can. Even 10 minutes of practice is helpful. Find a mindfulness practice that works best for you. This may include one or more of the following: Meditation. This involves focusing your mind on a certain thought or activity. Breathing awareness exercises. These help you to stay present by focusing on your breath. Body scan. For this practice, you lie down and pay attention to each part of your body from head to toe. You can identify tension and soreness and consciously relax parts of your body. Yoga. Yoga involves stretching and breathing, and it can improve your ability to move and be flexible. It can also help you to test your body's limits, which can help you release stress. Mindful eating. This way of eating involves focusing on the taste, texture, color, and smell of each bite of food. This slows down eating and helps you feel full sooner. For this reason, it can be an important part of a weight loss plan. Find a podcast or recording that provides guidance for breathing awareness, body scan, or meditation exercises. You can listen to these any time when you have a free moment to rest without distractions. Follow your treatment plan as told by your health care provider. This may include taking regular medicines and making changes to your diet or lifestyle as recommended. Where to find more information You can find more information about MBSR from: Your health care provider. Community-based meditation centers or programs. Programs offered near you. Summary Mindfulness-based stress  reduction (MBSR) is a program that teaches you how to consciously pay attention to the present moment. It is used to help you deal better with daily stress, feelings, and pain. MBSR focuses on developing self-awareness, which allows you to respond to life stress without judgment or negative feelings. MBSR programs may involve learning different mindfulness practices, such as breathing exercises, meditation, yoga, body scan, or mindful eating. Find a mindfulness practice that works best for you, and set aside time for it on a regular basis. This information is not intended to replace advice given to you by your health care provider. Make sure you discuss any questions you have with your health care provider. Mann Skaggs L, NP 04/18/2024, 12:19 PM

## 2024-04-18 NOTE — ED Notes (Signed)
 Discharged by provider

## 2024-04-22 ENCOUNTER — Telehealth (HOSPITAL_COMMUNITY): Payer: Self-pay

## 2024-04-22 NOTE — Telephone Encounter (Signed)
 Please reach out to pt pertaining to medication .

## 2024-04-27 ENCOUNTER — Ambulatory Visit (HOSPITAL_COMMUNITY): Payer: Self-pay | Admitting: Psychiatry

## 2024-04-27 NOTE — Progress Notes (Signed)
 BH MD/PA/NP OP Progress Note  05/08/2024 10:48 AM Stephanie [name]  MRN:  [id number]  Visit Diagnosis:    ICD-10-CM   1. Bipolar affective disorder, currently depressed, moderate (HCC)  F31.32 ARIPiprazole  (ABILIFY ) 10 MG tablet    2. Generalized anxiety disorder  F41.1 busPIRone  (BUSPAR ) 10 MG tablet      Assessment:   Stephanie [name] is a 43 year old female with a psychiatric history of bipolar disorder, depression, PTSD and possible schizophrenia that presented to the [facility] behavioral health outpatient clinic establishing care.  She reported work related issues, no unusual assessment denied any paranoia, AVH, active or passive SI, endorsed depression and anxiety.  She was on Zoloft and Klonopin in the past, was started on Seroquel  50 mg at bedtime, dose titrated up to 100 mg.  She was also started on buspirone  7.5 mg twice daily for anxiety.  Later she was switched to Abilify  5 mg daily and buspirone  10 mg twice daily for anxiety.  The patient has still been very anxious, affecting her mood due to the psychosocial stressors that include people around her work, having to take care of 5 kids.  She has been trying to switch work, switched from [facility] earlier as she is trying to provide for her family.  She was also prescribed hydroxyzine  which she stopped taking it as that was not making her feel good.  She has been feeling angry and frustrated at work and continues to report her mood as not good .  She is reporting poor appetite but states that she eats food sometimes and poor sleep as she is constantly thinking about her work and stressors around her.  We did some motivational counseling today.  She is also smoking marijuana every day, is not currently wanting to stop using it.  Reported that Abilify  and buspirone  helped her minimally.  She was very emotionally labile during the conversation.  She requested for filling of accommodation form for her work, directed her to the front desk.   Discussed about trying to abstain from smoking marijuana and cigarettes.  Also discussed about increasing her Abilify  to 10 mg for her mood, emotional lability, patient amenable to the plan.  Discussed about increasing buspirone  to 10 mg 3 times daily for her anxiety, patient amenable as well.  Discussed the risks benefits and side effects for the medications, prescriptions were sent to the preferred pharmacy.  RTC in 3 to 4 weeks.     Risk Assessment: An assessment of suicide and violence risk factors was performed as part of this evaluation and is not significantly changed from the last visit. While future psychiatric events cannot be accurately predicted, the patient does not  currently require acute inpatient psychiatric care and does not  currently meet [place]  involuntary commitment criteria. Patient was given contact information for crisis resources, behavioral health clinic and was instructed to call 911 for emergencies.      1. MDD without psychotic features H/O Bipolar disorder  - Increase Abilify  to 10 mg daily   2. PTSD/Generalized anxiety disorder   - Increase buspirone  to 10 mg 3 times daily   Chief Complaint:  Chief Complaint  Patient presents with   Follow-up   Medication Refill   Anxiety   HPI:   Stephanie [name] is a 43 year old female with a past psychiatric history significant for possible schizophrenia, bipolar disorder, depression, and PTSD who presents to [facility] for follow-up. Patient presented to the clinic unaccompanied.  She reported her mood as not good .  She denied any active or passive SI/HI/AVH.  She was emotionally labile during the conversation, stated that I am still feeling angry and frustrated with issues at work , reported that people at work feel that I am stating .  She reported that she is happy to work past paced places but has not been finding appropriate work currently.  Reported that  she stopped hydroxyzine  as it did not make me feel good .  She denied any side effects of the medications or any new physical concerns.  She denied any symptoms of mania, psychosis.  She reported poor sleep and poor appetite in the setting of ongoing stressors I am continuously thinking about job and stuff .  Reported that she took buspirone  on 10 mg twice daily and Abilify  5 mg daily and that only helped her minimally. She also continues to smoke marijuana, currently does not want to quit stating I am not a crazy lady, I am not trying to change the world . She requested for filling of accommodation form for her work, directed her to the front desk.  Discussed about trying to abstain from smoking marijuana and cigarettes.  Also discussed about increasing her Abilify  to 10 mg for her mood, emotional lability, patient amenable to the plan.  Discussed about increasing buspirone  to 10 mg 3 times daily for her anxiety, patient amenable as well.  Discussed the risks benefits and side effects for the medications, prescriptions were sent to the preferred pharmacy.  RTC in 3 to 4 weeks.  Past Psychiatric History:  Patient with a past psychiatric history significant for bipolar disorder, depression, panic attacks, and possible schizophrenia.   Patient has a past history of hospitalization due to mental health.  Patient reports that she was last hospitalized in 2010 at [facility] ED after having a nervous breakdown shortly after having twins.   Patient denies a past history of suicide attempt.   Patient denies a past history of homicide attempt.  Past Medical History:  Past Medical History:  Diagnosis Date   Anemia    Depression    Gestational diabetes mellitus    gestational only   H/O cesarean section 2010   Headache    migraines   Hemorrhoids    Hypertension    Panic attacks    PTSD (post-traumatic stress disorder)    Schizophrenia (HCC)    Vaginal delivery 2004 , 2008, 2009    Past  Surgical History:  Procedure Laterality Date   CESAREAN SECTION     COLONOSCOPY WITH PROPOFOL   08/19/2017   Procedure: COLONOSCOPY WITH PROPOFOL ;  Surgeon: Teresa Lonni HERO, MD;  Location: [facility];  Service: General;;   COLONOSCOPY WITH PROPOFOL  N/A 02/21/2018   Procedure: COLONOSCOPY WITH PROPOFOL ;  Surgeon: Abran Norleen SAILOR, MD;  Location: [facility];  Service: Endoscopy;  Laterality: N/A;   EVALUATION UNDER ANESTHESIA WITH HEMORRHOIDECTOMY N/A 02/26/2018   Procedure: EXAM UNDER ANESTHESIA WITH HEMORRHOIDAL LIGATION AND PEXY, EXTERNAL HEMORRHOIDECTOMY X2;  Surgeon: Sheldon Standing, MD;  Location: [facility];  Service: General;  Laterality: N/A;   FLEXIBLE SIGMOIDOSCOPY  09/25/2017   Procedure: FLEXIBLE SIGMOIDOSCOPY;  Surgeon: Teresa Lonni HERO, MD;  Location: [facility];  Service: General;;   i and d perirectal abcess  2016   INCISION AND DRAINAGE PERIRECTAL ABSCESS N/A 05/28/2015   Procedure: IRRIGATION AND DEBRIDEMENT PERIRECTAL ABSCESS;  Surgeon: Krystal Spinner, MD;  Location: [facility];  Service: General;  Laterality: N/A;   IR  RADIOLOGIST EVAL & MGMT  04/02/2017   IR RADIOLOGIST EVAL & MGMT  04/15/2018   IR RADIOLOGIST EVAL & MGMT  04/29/2018   TUBAL LIGATION  2010   XI ROBOTIC ASSISTED LOWER ANTERIOR RESECTION N/A 02/26/2018   Procedure: XI ROBOTIC ASSISTED LYSIS OF ADHESIONS, RETRORECTAL RESECTION OF PRESACRAL CYSTIC MASS, FIREFLY INJECTION;  Surgeon: Sheldon Standing, MD;  Location: [facility];  Service: General;  Laterality: N/A;    Family Psychiatric History:  Brother - ADHD Son - ODD, DMDD, ADHD, and mentally delayed Son (3rd oldest) - DMDD, ADHD    Family History:  Family History  Problem Relation Age of Onset   Hypertension Mother    Hypertension Other     Social History:  Social History   Socioeconomic History   Marital status: Single    Spouse name: Not on file   Number of children: Not on file   Years of education: Not on file   Highest education level: Not on file   Occupational History   Not on file  Tobacco Use   Smoking status: Every Day    Current packs/day: 0.50    Types: Cigarettes   Smokeless tobacco: Never  Vaping Use   Vaping status: Never Used  Substance and Sexual Activity   Alcohol use: Yes    Comment: ocassionally   Drug use: Not Currently    Frequency: 3.0 times per week    Types: Marijuana    Comment: occasionally   Sexual activity: Yes  Other Topics Concern   Not on file  Social History Narrative   Not on file   Social Drivers of Health   Financial Resource Strain: Not on file  Food Insecurity: Not on file  Transportation Needs: Not on file  Physical Activity: Not on file  Stress: Not on file  Social Connections: Not on file    Allergies: No Known Allergies  Current Medications: Current Outpatient Medications  Medication Sig Dispense Refill   acetaminophen  (TYLENOL  8 HOUR) 650 MG CR tablet Take 1 tablet (650 mg total) by mouth every 8 (eight) hours as needed for pain or fever. 30 tablet 0   ARIPiprazole  (ABILIFY ) 10 MG tablet Take 1 tablet (10 mg total) by mouth daily. 30 tablet 1   busPIRone  (BUSPAR ) 10 MG tablet Take 1 tablet (10 mg total) by mouth 3 (three) times daily. 45 tablet 1   hydrOXYzine  (ATARAX ) 10 MG tablet Take 1 tablet (10 mg total) by mouth 3 (three) times daily as needed. 75 tablet 1   ibuprofen  (ADVIL ) 600 MG tablet Take 1 tablet (600 mg total) by mouth every 6 (six) hours as needed. 30 tablet 0   lisinopril  (ZESTRIL ) 10 MG tablet Take 1 tablet (10 mg total) by mouth daily. 30 tablet 11   metroNIDAZOLE  (FLAGYL ) 500 MG tablet Take 1 tablet (500 mg total) by mouth 2 (two) times daily. 14 tablet 0   naproxen  (NAPROSYN ) 500 MG tablet Take 1 tablet (500 mg total) by mouth 2 (two) times daily. 20 tablet 0   omeprazole  (PRILOSEC) 40 MG capsule Take 1 capsule (40 mg total) by mouth daily. 30 capsule 0   No current facility-administered medications for this visit.     Musculoskeletal: Strength &  Muscle Tone: within normal limits Gait & Station: normal Patient leans: N/A  Psychiatric Specialty Exam: Blood pressure (!) 146/98, pulse (!) 113, height 5' 6 (1.676 m), weight 166 lb (75.3 kg).Body mass index is 26.79 kg/m. Review of Systems  Constitutional:  Negative for  activity change, appetite change, chills, diaphoresis and fatigue.  HENT:  Negative for congestion, dental problem, drooling, ear discharge and ear pain.   Eyes:  Negative for pain, discharge and itching.  Respiratory:  Negative for apnea, cough, choking and chest tightness.   Cardiovascular:  Negative for chest pain, palpitations and leg swelling.  Gastrointestinal:  Negative for abdominal distention, abdominal pain, constipation, diarrhea and nausea.  Endocrine: Negative for cold intolerance and heat intolerance.  Genitourinary:  Negative for difficulty urinating, dysuria, flank pain, frequency, hematuria and urgency.  Musculoskeletal:  Negative for arthralgias, back pain, gait problem, joint swelling, myalgias and neck pain.  Skin:  Negative for color change and pallor.  Allergic/Immunologic: Negative for environmental allergies and food allergies.  Neurological:  Negative for dizziness, seizures, syncope, facial asymmetry, speech difficulty, light-headedness, numbness and headaches.  Psychiatric/Behavioral:  Negative for agitation, behavioral problems, confusion, decreased concentration, dysphoric mood, hallucinations, self-injury, sleep disturbance and suicidal ideas. The patient is not nervous/anxious and is not hyperactive.     General Appearance: Casual  Eye Contact:  Good  Speech:  Normal Rate  Volume:  Normal  Mood:  Depressed  Affect:  Congruent  Thought Content: Logical   Suicidal Thoughts:  No  Homicidal Thoughts:  No  Thought Process:  Coherent  Orientation:  Full (Time, Place, and Person)    Memory: Immediate;   Good Recent;   Good Remote;   Good  Judgment:  Fair  Insight:  Fair  Concentration:   Concentration: Good and Attention Span: Good  Recall:  not formally assessed   Fund of Knowledge: Good  Language: Good  Psychomotor Activity:  Normal  Akathisia:  No  AIMS (if indicated): not done  Assets:  Communication Skills Desire for Improvement Financial Resources/Insurance Housing Resilience  ADL's:  Intact  Cognition: WNL  Sleep:  Fair   Metabolic Disorder Labs: Lab Results  Component Value Date   HGBA1C 5.2 02/24/2018   MPG 102.54 02/24/2018   No results found for: PROLACTIN No results found for: CHOL, TRIG, HDL, CHOLHDL, VLDL, LDLCALC   Therapeutic Level Labs: No results found for: LITHIUM No results found for: VALPROATE No results found for: CBMZ   Screenings: GAD-7    Flowsheet Row Office Visit from 04/01/2024 in [facility]  Total GAD-7 Score 21   PHQ2-9    Flowsheet Row Clinical Support from 05/08/2024 in [facility] Office Visit from 04/01/2024 in [facility] Office Visit from 04/25/2022 in [facility] - A Dept Of [place]. [facility] Office Visit from 10/25/2017 in [facility] - A Dept Of [place]. [facility]  PHQ-2 Total Score 6 6 1  0  PHQ-9 Total Score 21 23 11  --   Flowsheet Row ED from 04/18/2024 in [facility] Office Visit from 04/01/2024 in [facility] ED from 02/14/2024 in [facility] Emergency Department at [facility]  C-SSRS RISK CATEGORY No Risk No Risk No Risk    Collaboration of Care: Collaboration of Care: Other Dr. Jean  Patient/Guardian was advised Release of Information must be obtained prior to any record release in order to collaborate their care with an outside provider. Patient/Guardian was advised if they have not already done so to contact the registration department to sign all necessary forms in order for us  to  release information regarding their care.   Consent: Patient/Guardian gives verbal consent for treatment  and assignment of benefits for services provided during this visit. Patient/Guardian expressed understanding and agreed to proceed.    Quantae Martel, MD 05/08/2024, 10:48 AM

## 2024-05-08 ENCOUNTER — Ambulatory Visit (INDEPENDENT_AMBULATORY_CARE_PROVIDER_SITE_OTHER): Payer: MEDICAID

## 2024-05-08 DIAGNOSIS — F411 Generalized anxiety disorder: Secondary | ICD-10-CM | POA: Diagnosis not present

## 2024-05-08 DIAGNOSIS — F3132 Bipolar disorder, current episode depressed, moderate: Secondary | ICD-10-CM | POA: Diagnosis not present

## 2024-05-08 MED ORDER — ARIPIPRAZOLE 10 MG PO TABS: 10.0000 mg | ORAL_TABLET | Freq: Every day | ORAL | 1 refills | Status: DC

## 2024-05-08 MED ORDER — BUSPIRONE HCL 10 MG PO TABS: 10.0000 mg | ORAL_TABLET | Freq: Three times a day (TID) | ORAL | 1 refills | Status: DC

## 2024-05-13 ENCOUNTER — Encounter (HOSPITAL_COMMUNITY): Payer: Self-pay

## 2024-05-13 ENCOUNTER — Emergency Department (HOSPITAL_COMMUNITY)
Admission: EM | Admit: 2024-05-13 | Discharge: 2024-05-13 | Discharge status: Against Medical Advice | Payer: MEDICAID | Attending: Emergency Medicine | Admitting: Emergency Medicine

## 2024-05-13 ENCOUNTER — Other Ambulatory Visit: Payer: Self-pay

## 2024-05-13 DIAGNOSIS — Z5321 Procedure and treatment not carried out due to patient leaving prior to being seen by health care provider: Secondary | ICD-10-CM | POA: Diagnosis not present

## 2024-05-13 DIAGNOSIS — R519 Headache, unspecified: Secondary | ICD-10-CM | POA: Diagnosis present

## 2024-05-13 DIAGNOSIS — R42 Dizziness and giddiness: Secondary | ICD-10-CM | POA: Diagnosis not present

## 2024-05-13 DIAGNOSIS — R2243 Localized swelling, mass and lump, lower limb, bilateral: Secondary | ICD-10-CM | POA: Diagnosis not present

## 2024-05-13 NOTE — ED Notes (Signed)
 Pt Nax1 for vitas

## 2024-05-13 NOTE — ED Triage Notes (Addendum)
 Pt coming from work for headache that started today on her break and light headedness. Denies vision changes. Pt also has some swelling in bilateral feet, more to L foot. Pt reports 6/10 throbbing pain in feet. Pt reports starting new medication last week.

## 2024-05-16 ENCOUNTER — Telehealth: Payer: MEDICAID | Admitting: Nurse Practitioner

## 2024-05-16 ENCOUNTER — Encounter: Payer: Self-pay | Admitting: Nurse Practitioner

## 2024-05-16 DIAGNOSIS — M25473 Effusion, unspecified ankle: Secondary | ICD-10-CM | POA: Diagnosis not present

## 2024-05-16 MED ORDER — IBUPROFEN 800 MG PO TABS: 800.0000 mg | ORAL_TABLET | Freq: Three times a day (TID) | ORAL | 0 refills | Status: AC | PRN

## 2024-05-16 NOTE — Patient Instructions (Signed)
  [name], thank you for joining Haze LELON Servant, NP for today's virtual visit.  While this provider is not your primary care provider (PCP), if your PCP is located in our provider database this encounter information will be shared with them immediately following your visit.   A [place] MyChart account gives you access to today's visit and all your visits, tests, and labs performed at [facility]  click here if you don't have a [place] MyChart account or go to [email or web address]  Consent: (Patient) CARINNA NEWHART provided verbal consent for this virtual visit at the beginning of the encounter.  Current Medications:  Current Outpatient Medications:    ibuprofen  (ADVIL ) 800 MG tablet, Take 1 tablet (800 mg total) by mouth every 8 (eight) hours as needed., Disp: 30 tablet, Rfl: 0   acetaminophen  (TYLENOL  8 HOUR) 650 MG CR tablet, Take 1 tablet (650 mg total) by mouth every 8 (eight) hours as needed for pain or fever., Disp: 30 tablet, Rfl: 0   ARIPiprazole  (ABILIFY ) 10 MG tablet, Take 1 tablet (10 mg total) by mouth daily., Disp: 30 tablet, Rfl: 1   busPIRone  (BUSPAR ) 10 MG tablet, Take 1 tablet (10 mg total) by mouth 3 (three) times daily., Disp: 45 tablet, Rfl: 1   hydrOXYzine  (ATARAX ) 10 MG tablet, Take 1 tablet (10 mg total) by mouth 3 (three) times daily as needed., Disp: 75 tablet, Rfl: 1   lisinopril  (ZESTRIL ) 10 MG tablet, Take 1 tablet (10 mg total) by mouth daily., Disp: 30 tablet, Rfl: 11   metroNIDAZOLE  (FLAGYL ) 500 MG tablet, Take 1 tablet (500 mg total) by mouth 2 (two) times daily., Disp: 14 tablet, Rfl: 0   naproxen  (NAPROSYN ) 500 MG tablet, Take 1 tablet (500 mg total) by mouth 2 (two) times daily., Disp: 20 tablet, Rfl: 0   omeprazole  (PRILOSEC) 40 MG capsule, Take 1 capsule (40 mg total) by mouth daily., Disp: 30 capsule, Rfl: 0   Medications ordered in this encounter:  Meds ordered this encounter  Medications   ibuprofen  (ADVIL ) 800 MG  tablet    Sig: Take 1 tablet (800 mg total) by mouth every 8 (eight) hours as needed.    Dispense:  30 tablet    Refill:  0    Supervising Provider:   LAMPTEY, PHILIP O [[id number]]     *If you need refills on other medications prior to your next appointment, please contact your pharmacy*  Follow-Up: Call back or seek an in-person evaluation if the symptoms worsen or if the condition fails to improve as anticipated.  [facility] [phone]  Other Instructions Follow up with PCP for referral second opinion as requested   If you have been instructed to have an in-person evaluation today at a local Urgent Care facility, please use the link below. It will take you to a list of all of our available [facility], including address, phone number and hours of operation. Please do not delay care.  [facility]  If you or a family member do not have a primary care provider, use the link below to schedule a visit and establish care. When you choose a [facility] primary care physician or advanced practice provider, you gain a long-term partner in health. Find a Primary Care Provider  Learn more about [facility]'s in-office and virtual care options: [place] - Get Care Now
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
## 2024-05-29 NOTE — Progress Notes (Deleted)
 BH MD/PA/NP OP Progress Note  05/29/2024 10:59 AM BREYANNA [name]  MRN:  [id number]  Visit Diagnosis:  No diagnosis found.   Assessment:   [name] is a 43 year old female with a psychiatric history of bipolar disorder, depression, PTSD and possible schizophrenia that presented to the [facility] behavioral health outpatient clinic establishing care.  She reported work related issues, no unusual assessment denied any paranoia, AVH, active or passive SI, endorsed depression and anxiety.  She was on Zoloft and Klonopin in the past, was started on Seroquel  50 mg at bedtime, dose titrated up to 100 mg.  She was also started on buspirone  7.5 mg twice daily for anxiety.  Later she was switched to Abilify  5 mg daily and buspirone  10 mg twice daily for anxiety.  The patient has still been very anxious, affecting her mood due to the psychosocial stressors that include people around her work, having to take care of 5 kids.  She has been trying to switch work, switched from [employer] [facility] [employer] [facility] [employer] earlier as she is trying to provide for her family.  She was also prescribed hydroxyzine  which she stopped taking it as that was not making her feel good.  She has been feeling angry and frustrated at work and continues to report her mood as not good .  She is reporting poor appetite but states that she eats food sometimes and poor sleep as she is constantly thinking about her work and stressors around her.  We did some motivational counseling today.  She is also smoking marijuana every day, is not currently wanting to stop using it.  Reported that Abilify  and buspirone  helped her minimally.  She was very emotionally labile during the conversation.  She requested for filling of accommodation form for her work, directed her to the front desk.  Discussed about trying to abstain from smoking marijuana and cigarettes.  Also discussed about increasing her Abilify  to 10 mg for her mood, emotional lability, patient amenable to the  plan.  Discussed about increasing buspirone  to 10 mg 3 times daily for her anxiety, patient amenable as well.  Discussed the risks benefits and side effects for the medications, prescriptions were sent to the preferred pharmacy.  RTC in 3 to 4 weeks.     Risk Assessment: An assessment of suicide and violence risk factors was performed as part of this evaluation and is not significantly changed from the last visit. While future psychiatric events cannot be accurately predicted, the patient does not  currently require acute inpatient psychiatric care and does not  currently meet [facility]  involuntary commitment criteria. Patient was given contact information for crisis resources, behavioral health clinic and was instructed to call 911 for emergencies.      1. MDD without psychotic features H/O Bipolar disorder  - Increase Abilify  to 10 mg daily   2. PTSD/Generalized anxiety disorder   - Increase buspirone  to 10 mg 3 times daily   Chief Complaint:  No chief complaint on file.  HPI:   Stephanie [name] is a 43 year old female with a past psychiatric history significant for possible schizophrenia, bipolar disorder, depression, and PTSD who presents to [facility] for follow-up. Patient presented to the clinic unaccompanied.  She reported her mood as not good .  She denied any active or passive SI/HI/AVH.  She was emotionally labile during the conversation, stated that I am still feeling angry and frustrated with issues at work , reported that people at work feel that  I am stating .  She reported that she is happy to work past paced places but has not been finding appropriate work currently.  Reported that she stopped hydroxyzine  as it did not make me feel good .  She denied any side effects of the medications or any new physical concerns.  She denied any symptoms of mania, psychosis.  She reported poor sleep and poor appetite in the setting of  ongoing stressors I am continuously thinking about job and stuff .  Reported that she took buspirone  on 10 mg twice daily and Abilify  5 mg daily and that only helped her minimally. She also continues to smoke marijuana, currently does not want to quit stating I am not a crazy lady, I am not trying to change the world . She requested for filling of accommodation form for her work, directed her to the front desk.  Discussed about trying to abstain from smoking marijuana and cigarettes.  Also discussed about increasing her Abilify  to 10 mg for her mood, emotional lability, patient amenable to the plan.  Discussed about increasing buspirone  to 10 mg 3 times daily for her anxiety, patient amenable as well.  Discussed the risks benefits and side effects for the medications, prescriptions were sent to the preferred pharmacy.  RTC in 3 to 4 weeks.  Past Psychiatric History:  Patient with a past psychiatric history significant for bipolar disorder, depression, panic attacks, and possible schizophrenia.   Patient has a past history of hospitalization due to mental health.  Patient reports that she was last hospitalized in 2010 at [facility] ED after having a nervous breakdown shortly after having twins.   Patient denies a past history of suicide attempt.   Patient denies a past history of homicide attempt.  Past Medical History:  Past Medical History:  Diagnosis Date   Anemia    Depression    Gestational diabetes mellitus    gestational only   H/O cesarean section 2010   Headache    migraines   Hemorrhoids    Hypertension    Panic attacks    PTSD (post-traumatic stress disorder)    Schizophrenia (HCC)    Vaginal delivery 2004 , 2008, 2009    Past Surgical History:  Procedure Laterality Date   CESAREAN SECTION     COLONOSCOPY WITH PROPOFOL   08/19/2017   Procedure: COLONOSCOPY WITH PROPOFOL ;  Surgeon: Teresa Lonni HERO, MD;  Location: [facility];  Service: General;;   COLONOSCOPY  WITH PROPOFOL  N/A 02/21/2018   Procedure: COLONOSCOPY WITH PROPOFOL ;  Surgeon: Abran Norleen SAILOR, MD;  Location: [facility];  Service: Endoscopy;  Laterality: N/A;   EVALUATION UNDER ANESTHESIA WITH HEMORRHOIDECTOMY N/A 02/26/2018   Procedure: EXAM UNDER ANESTHESIA WITH HEMORRHOIDAL LIGATION AND PEXY, EXTERNAL HEMORRHOIDECTOMY X2;  Surgeon: Sheldon Standing, MD;  Location: [facility];  Service: General;  Laterality: N/A;   FLEXIBLE SIGMOIDOSCOPY  09/25/2017   Procedure: FLEXIBLE SIGMOIDOSCOPY;  Surgeon: Teresa Lonni HERO, MD;  Location: [facility];  Service: General;;   i and d perirectal abcess  2016   INCISION AND DRAINAGE PERIRECTAL ABSCESS N/A 05/28/2015   Procedure: IRRIGATION AND DEBRIDEMENT PERIRECTAL ABSCESS;  Surgeon: Krystal Spinner, MD;  Location: [facility];  Service: General;  Laterality: N/A;   IR RADIOLOGIST EVAL & MGMT  04/02/2017   IR RADIOLOGIST EVAL & MGMT  04/15/2018   IR RADIOLOGIST EVAL & MGMT  04/29/2018   TUBAL LIGATION  2010   XI ROBOTIC ASSISTED LOWER ANTERIOR RESECTION N/A 02/26/2018   Procedure: XI ROBOTIC  ASSISTED LYSIS OF ADHESIONS, RETRORECTAL RESECTION OF PRESACRAL CYSTIC MASS, FIREFLY INJECTION;  Surgeon: Sheldon Standing, MD;  Location: [facility];  Service: General;  Laterality: N/A;    Family Psychiatric History:  Brother - ADHD Son - ODD, DMDD, ADHD, and mentally delayed Son (3rd oldest) - DMDD, ADHD    Family History:  Family History  Problem Relation Age of Onset   Hypertension Mother    Hypertension Other     Social History:  Social History   Socioeconomic History   Marital status: Single    Spouse name: Not on file   Number of children: Not on file   Years of education: Not on file   Highest education level: Not on file  Occupational History   Not on file  Tobacco Use   Smoking status: Every Day    Current packs/day: 0.50    Types: Cigarettes   Smokeless tobacco: Never  Vaping Use   Vaping status: Never Used  Substance and Sexual Activity   Alcohol use: Yes     Comment: ocassionally   Drug use: Not Currently    Frequency: 3.0 times per week    Types: Marijuana    Comment: occasionally   Sexual activity: Yes  Other Topics Concern   Not on file  Social History Narrative   Not on file   Social Drivers of Health   Financial Resource Strain: Not on file  Food Insecurity: Not on file  Transportation Needs: Not on file  Physical Activity: Not on file  Stress: Not on file  Social Connections: Not on file    Allergies: No Known Allergies  Current Medications: Current Outpatient Medications  Medication Sig Dispense Refill   acetaminophen  (TYLENOL  8 HOUR) 650 MG CR tablet Take 1 tablet (650 mg total) by mouth every 8 (eight) hours as needed for pain or fever. 30 tablet 0   ARIPiprazole  (ABILIFY ) 10 MG tablet Take 1 tablet (10 mg total) by mouth daily. 30 tablet 1   busPIRone  (BUSPAR ) 10 MG tablet Take 1 tablet (10 mg total) by mouth 3 (three) times daily. 45 tablet 1   hydrOXYzine  (ATARAX ) 10 MG tablet Take 1 tablet (10 mg total) by mouth 3 (three) times daily as needed. 75 tablet 1   ibuprofen  (ADVIL ) 800 MG tablet Take 1 tablet (800 mg total) by mouth every 8 (eight) hours as needed. 30 tablet 0   lisinopril  (ZESTRIL ) 10 MG tablet Take 1 tablet (10 mg total) by mouth daily. 30 tablet 11   metroNIDAZOLE  (FLAGYL ) 500 MG tablet Take 1 tablet (500 mg total) by mouth 2 (two) times daily. 14 tablet 0   naproxen  (NAPROSYN ) 500 MG tablet Take 1 tablet (500 mg total) by mouth 2 (two) times daily. 20 tablet 0   omeprazole  (PRILOSEC) 40 MG capsule Take 1 capsule (40 mg total) by mouth daily. 30 capsule 0   No current facility-administered medications for this visit.     Musculoskeletal: Strength & Muscle Tone: within normal limits Gait & Station: normal Patient leans: N/A  Psychiatric Specialty Exam: There were no vitals taken for this visit.There is no height or weight on file to calculate BMI. Review of Systems  Constitutional:  Negative  for activity change, appetite change, chills, diaphoresis and fatigue.  HENT:  Negative for congestion, dental problem, drooling, ear discharge and ear pain.   Eyes:  Negative for pain, discharge and itching.  Respiratory:  Negative for apnea, cough, choking and chest tightness.   Cardiovascular:  Negative for  chest pain, palpitations and leg swelling.  Gastrointestinal:  Negative for abdominal distention, abdominal pain, constipation, diarrhea and nausea.  Endocrine: Negative for cold intolerance and heat intolerance.  Genitourinary:  Negative for difficulty urinating, dysuria, flank pain, frequency, hematuria and urgency.  Musculoskeletal:  Negative for arthralgias, back pain, gait problem, joint swelling, myalgias and neck pain.  Skin:  Negative for color change and pallor.  Allergic/Immunologic: Negative for environmental allergies and food allergies.  Neurological:  Negative for dizziness, seizures, syncope, facial asymmetry, speech difficulty, light-headedness, numbness and headaches.  Psychiatric/Behavioral:  Negative for agitation, behavioral problems, confusion, decreased concentration, dysphoric mood, hallucinations, self-injury, sleep disturbance and suicidal ideas. The patient is not nervous/anxious and is not hyperactive.     General Appearance: Casual  Eye Contact:  Good  Speech:  Normal Rate  Volume:  Normal  Mood:  Depressed  Affect:  Congruent  Thought Content: Logical   Suicidal Thoughts:  No  Homicidal Thoughts:  No  Thought Process:  Coherent  Orientation:  Full (Time, Place, and Person)    Memory: Immediate;   Good Recent;   Good Remote;   Good  Judgment:  Fair  Insight:  Fair  Concentration:  Concentration: Good and Attention Span: Good  Recall:  not formally assessed   Fund of Knowledge: Good  Language: Good  Psychomotor Activity:  Normal  Akathisia:  No  AIMS (if indicated): not done  Assets:  Communication Skills Desire for Improvement Financial  Resources/Insurance Housing Resilience  ADL's:  Intact  Cognition: WNL  Sleep:  Fair   Metabolic Disorder Labs: Lab Results  Component Value Date   HGBA1C 5.2 02/24/2018   MPG 102.54 02/24/2018   No results found for: PROLACTIN No results found for: CHOL, TRIG, HDL, CHOLHDL, VLDL, LDLCALC   Therapeutic Level Labs: No results found for: LITHIUM No results found for: VALPROATE No results found for: CBMZ   Screenings: GAD-7    Flowsheet Row Office Visit from 04/01/2024 in [facility]  Total GAD-7 Score 21   PHQ2-9    Flowsheet Row Clinical Support from 05/08/2024 in [facility] Office Visit from 04/01/2024 in [facility] Office Visit from 04/25/2022 in [facility] - A Dept Of [place]. [facility] Office Visit from 10/25/2017 in [facility] - A Dept Of [place]. [facility]  PHQ-2 Total Score 6 6 1  0  PHQ-9 Total Score 21 23 11  --   Flowsheet Row ED from 04/18/2024 in [facility] Office Visit from 04/01/2024 in [facility] ED from 02/14/2024 in [facility] Emergency Department at [facility]  C-SSRS RISK CATEGORY No Risk No Risk No Risk    Collaboration of Care: Collaboration of Care: Other Dr. Jean  Patient/Guardian was advised Release of Information must be obtained prior to any record release in order to collaborate their care with an outside provider. Patient/Guardian was advised if they have not already done so to contact the registration department to sign all necessary forms in order for us  to release information regarding their care.   Consent: Patient/Guardian gives verbal consent for treatment and assignment of benefits for services provided during this visit. Patient/Guardian expressed understanding and agreed to proceed.    Merilee Wible,  MD 05/29/2024, 10:59 AM

## 2024-06-09 ENCOUNTER — Encounter (HOSPITAL_COMMUNITY): Payer: Self-pay

## 2024-06-09 ENCOUNTER — Encounter (HOSPITAL_COMMUNITY): Payer: MEDICAID

## 2024-06-10 NOTE — Progress Notes (Deleted)
 BH MD/PA/NP OP Progress Note  06/10/2024 9:07 AM Stephanie [name]  MRN:  [id number]  Visit Diagnosis:  No diagnosis found.   Assessment:   Stephanie [name] is a 43 year old female with a psychiatric history of bipolar disorder, depression, PTSD and possible schizophrenia that presented to the [facility] behavioral health outpatient clinic establishing care.  She reported work related issues, no unusual assessment denied any paranoia, AVH, active or passive SI, endorsed depression and anxiety.  She was on Zoloft and Klonopin in the past, was started on Seroquel  50 mg at bedtime, dose titrated up to 100 mg.  She was also started on buspirone  7.5 mg twice daily for anxiety.  Later she was switched to Abilify  5 mg daily and buspirone  10 mg twice daily for anxiety.  The patient has still been very anxious, affecting her mood due to the psychosocial stressors that include people around her work, having to take care of 5 kids.  She has been trying to switch work, switched from [facility] earlier as she is trying to provide for her family.  She was also prescribed hydroxyzine  which she stopped taking it as that was not making her feel good.  She has been feeling angry and frustrated at work and continues to report her mood as not good .  She is reporting poor appetite but states that she eats food sometimes and poor sleep as she is constantly thinking about her work and stressors around her.  We did some motivational counseling today.  She is also smoking marijuana every day, is not currently wanting to stop using it.  Reported that Abilify  and buspirone  helped her minimally.  She was very emotionally labile during the conversation.  She requested for filling of accommodation form for her work, directed her to the front desk.  Discussed about trying to abstain from smoking marijuana and cigarettes.  Also discussed about increasing her Abilify  to 10 mg for her mood, emotional lability, patient amenable to the  plan.  Discussed about increasing buspirone  to 10 mg 3 times daily for her anxiety, patient amenable as well.  Discussed the risks benefits and side effects for the medications, prescriptions were sent to the preferred pharmacy.  RTC in 3 to 4 weeks.     Risk Assessment: An assessment of suicide and violence risk factors was performed as part of this evaluation and is not significantly changed from the last visit. While future psychiatric events cannot be accurately predicted, the patient does not  currently require acute inpatient psychiatric care and does not  currently meet [place]  involuntary commitment criteria. Patient was given contact information for crisis resources, behavioral health clinic and was instructed to call 911 for emergencies.      1. MDD without psychotic features H/O Bipolar disorder  - Increase Abilify  to 10 mg daily   2. PTSD/Generalized anxiety disorder   - Increase buspirone  to 10 mg 3 times daily   Chief Complaint:  No chief complaint on file.  HPI:   Stephanie [name] is a 43 year old female with a past psychiatric history significant for possible schizophrenia, bipolar disorder, depression, and PTSD who presents to [facility] for follow-up. Patient presented to the clinic unaccompanied.  She reported her mood as not good .  She denied any active or passive SI/HI/AVH.  She was emotionally labile during the conversation, stated that I am still feeling angry and frustrated with issues at work , reported that people at work feel that  I am stating .  She reported that she is happy to work past paced places but has not been finding appropriate work currently.  Reported that she stopped hydroxyzine  as it did not make me feel good .  She denied any side effects of the medications or any new physical concerns.  She denied any symptoms of mania, psychosis.  She reported poor sleep and poor appetite in the setting of  ongoing stressors I am continuously thinking about job and stuff .  Reported that she took buspirone  on 10 mg twice daily and Abilify  5 mg daily and that only helped her minimally. She also continues to smoke marijuana, currently does not want to quit stating I am not a crazy lady, I am not trying to change the world . She requested for filling of accommodation form for her work, directed her to the front desk.  Discussed about trying to abstain from smoking marijuana and cigarettes.  Also discussed about increasing her Abilify  to 10 mg for her mood, emotional lability, patient amenable to the plan.  Discussed about increasing buspirone  to 10 mg 3 times daily for her anxiety, patient amenable as well.  Discussed the risks benefits and side effects for the medications, prescriptions were sent to the preferred pharmacy.  RTC in 3 to 4 weeks.  Past Psychiatric History:  Patient with a past psychiatric history significant for bipolar disorder, depression, panic attacks, and possible schizophrenia.   Patient has a past history of hospitalization due to mental health.  Patient reports that she was last hospitalized in 2010 at [facility] ED after having a nervous breakdown shortly after having twins.   Patient denies a past history of suicide attempt.   Patient denies a past history of homicide attempt.  Past Medical History:  Past Medical History:  Diagnosis Date   Anemia    Depression    Gestational diabetes mellitus    gestational only   H/O cesarean section 2010   Headache    migraines   Hemorrhoids    Hypertension    Panic attacks    PTSD (post-traumatic stress disorder)    Schizophrenia (HCC)    Vaginal delivery 2004 , 2008, 2009    Past Surgical History:  Procedure Laterality Date   CESAREAN SECTION     COLONOSCOPY WITH PROPOFOL   08/19/2017   Procedure: COLONOSCOPY WITH PROPOFOL ;  Surgeon: Teresa Lonni HERO, MD;  Location: [facility];  Service: General;;   COLONOSCOPY  WITH PROPOFOL  N/A 02/21/2018   Procedure: COLONOSCOPY WITH PROPOFOL ;  Surgeon: Abran Norleen SAILOR, MD;  Location: [facility];  Service: Endoscopy;  Laterality: N/A;   EVALUATION UNDER ANESTHESIA WITH HEMORRHOIDECTOMY N/A 02/26/2018   Procedure: EXAM UNDER ANESTHESIA WITH HEMORRHOIDAL LIGATION AND PEXY, EXTERNAL HEMORRHOIDECTOMY X2;  Surgeon: Sheldon Standing, MD;  Location: [facility] ORS;  Service: General;  Laterality: N/A;   FLEXIBLE SIGMOIDOSCOPY  09/25/2017   Procedure: FLEXIBLE SIGMOIDOSCOPY;  Surgeon: Teresa Lonni HERO, MD;  Location: [facility];  Service: General;;   i and d perirectal abcess  2016   INCISION AND DRAINAGE PERIRECTAL ABSCESS N/A 05/28/2015   Procedure: IRRIGATION AND DEBRIDEMENT PERIRECTAL ABSCESS;  Surgeon: Krystal Spinner, MD;  Location: [facility];  Service: General;  Laterality: N/A;   IR RADIOLOGIST EVAL & MGMT  04/02/2017   IR RADIOLOGIST EVAL & MGMT  04/15/2018   IR RADIOLOGIST EVAL & MGMT  04/29/2018   TUBAL LIGATION  2010   XI ROBOTIC ASSISTED LOWER ANTERIOR RESECTION N/A 02/26/2018   Procedure: XI ROBOTIC  ASSISTED LYSIS OF ADHESIONS, RETRORECTAL RESECTION OF PRESACRAL CYSTIC MASS, FIREFLY INJECTION;  Surgeon: Sheldon Standing, MD;  Location: [facility];  Service: General;  Laterality: N/A;    Family Psychiatric History:  Brother - ADHD Son - ODD, DMDD, ADHD, and mentally delayed Son (3rd oldest) - DMDD, ADHD    Family History:  Family History  Problem Relation Age of Onset   Hypertension Mother    Hypertension Other     Social History:  Social History   Socioeconomic History   Marital status: Single    Spouse name: Not on file   Number of children: Not on file   Years of education: Not on file   Highest education level: Not on file  Occupational History   Not on file  Tobacco Use   Smoking status: Every Day    Current packs/day: 0.50    Types: Cigarettes   Smokeless tobacco: Never  Vaping Use   Vaping status: Never Used  Substance and Sexual Activity   Alcohol use: Yes     Comment: ocassionally   Drug use: Not Currently    Frequency: 3.0 times per week    Types: Marijuana    Comment: occasionally   Sexual activity: Yes  Other Topics Concern   Not on file  Social History Narrative   Not on file   Social Drivers of Health   Financial Resource Strain: Not on file  Food Insecurity: Not on file  Transportation Needs: Not on file  Physical Activity: Not on file  Stress: Not on file  Social Connections: Not on file    Allergies: No Known Allergies  Current Medications: Current Outpatient Medications  Medication Sig Dispense Refill   acetaminophen  (TYLENOL  8 HOUR) 650 MG CR tablet Take 1 tablet (650 mg total) by mouth every 8 (eight) hours as needed for pain or fever. 30 tablet 0   ARIPiprazole  (ABILIFY ) 10 MG tablet Take 1 tablet (10 mg total) by mouth daily. 30 tablet 1   busPIRone  (BUSPAR ) 10 MG tablet Take 1 tablet (10 mg total) by mouth 3 (three) times daily. 45 tablet 1   hydrOXYzine  (ATARAX ) 10 MG tablet Take 1 tablet (10 mg total) by mouth 3 (three) times daily as needed. 75 tablet 1   ibuprofen  (ADVIL ) 800 MG tablet Take 1 tablet (800 mg total) by mouth every 8 (eight) hours as needed. 30 tablet 0   lisinopril  (ZESTRIL ) 10 MG tablet Take 1 tablet (10 mg total) by mouth daily. 30 tablet 11   metroNIDAZOLE  (FLAGYL ) 500 MG tablet Take 1 tablet (500 mg total) by mouth 2 (two) times daily. 14 tablet 0   naproxen  (NAPROSYN ) 500 MG tablet Take 1 tablet (500 mg total) by mouth 2 (two) times daily. 20 tablet 0   omeprazole  (PRILOSEC) 40 MG capsule Take 1 capsule (40 mg total) by mouth daily. 30 capsule 0   No current facility-administered medications for this visit.     Musculoskeletal: Strength & Muscle Tone: within normal limits Gait & Station: normal Patient leans: N/A  Psychiatric Specialty Exam: There were no vitals taken for this visit.There is no height or weight on file to calculate BMI. Review of Systems  Constitutional:  Negative  for activity change, appetite change, chills, diaphoresis and fatigue.  HENT:  Negative for congestion, dental problem, drooling, ear discharge and ear pain.   Eyes:  Negative for pain, discharge and itching.  Respiratory:  Negative for apnea, cough, choking and chest tightness.   Cardiovascular:  Negative for  chest pain, palpitations and leg swelling.  Gastrointestinal:  Negative for abdominal distention, abdominal pain, constipation, diarrhea and nausea.  Endocrine: Negative for cold intolerance and heat intolerance.  Genitourinary:  Negative for difficulty urinating, dysuria, flank pain, frequency, hematuria and urgency.  Musculoskeletal:  Negative for arthralgias, back pain, gait problem, joint swelling, myalgias and neck pain.  Skin:  Negative for color change and pallor.  Allergic/Immunologic: Negative for environmental allergies and food allergies.  Neurological:  Negative for dizziness, seizures, syncope, facial asymmetry, speech difficulty, light-headedness, numbness and headaches.  Psychiatric/Behavioral:  Negative for agitation, behavioral problems, confusion, decreased concentration, dysphoric mood, hallucinations, self-injury, sleep disturbance and suicidal ideas. The patient is not nervous/anxious and is not hyperactive.     General Appearance: Casual  Eye Contact:  Good  Speech:  Normal Rate  Volume:  Normal  Mood:  Depressed  Affect:  Congruent  Thought Content: Logical   Suicidal Thoughts:  No  Homicidal Thoughts:  No  Thought Process:  Coherent  Orientation:  Full (Time, Place, and Person)    Memory: Immediate;   Good Recent;   Good Remote;   Good  Judgment:  Fair  Insight:  Fair  Concentration:  Concentration: Good and Attention Span: Good  Recall:  not formally assessed   Fund of Knowledge: Good  Language: Good  Psychomotor Activity:  Normal  Akathisia:  No  AIMS (if indicated): not done  Assets:  Communication Skills Desire for Improvement Financial  Resources/Insurance Housing Resilience  ADL's:  Intact  Cognition: WNL  Sleep:  Fair   Metabolic Disorder Labs: Lab Results  Component Value Date   HGBA1C 5.2 02/24/2018   MPG 102.54 02/24/2018   No results found for: PROLACTIN No results found for: CHOL, TRIG, HDL, CHOLHDL, VLDL, LDLCALC   Therapeutic Level Labs: No results found for: LITHIUM No results found for: VALPROATE No results found for: CBMZ   Screenings: GAD-7    Flowsheet Row Office Visit from 04/01/2024 in [facility]  Total GAD-7 Score 21   PHQ2-9    Flowsheet Row Clinical Support from 05/08/2024 in [facility] Office Visit from 04/01/2024 in [facility] Office Visit from 04/25/2022 in [facility] - A Dept Of [place] Office Visit from 10/25/2017 in [facility] - A Dept Of [place]  PHQ-2 Total Score 6 6 1  0  PHQ-9 Total Score 21 23 11  --   Flowsheet Row ED from 04/18/2024 in [facility] Office Visit from 04/01/2024 in [facility] ED from 02/14/2024 in [facility] Emergency Department at [facility]  C-SSRS RISK CATEGORY No Risk No Risk No Risk    Collaboration of Care: Collaboration of Care: Other Dr. Jean  Patient/Guardian was advised Release of Information must be obtained prior to any record release in order to collaborate their care with an outside provider. Patient/Guardian was advised if they have not already done so to contact the registration department to sign all necessary forms in order for us  to release information regarding their care.   Consent: Patient/Guardian gives verbal consent for treatment and assignment of benefits for services provided during this visit. Patient/Guardian expressed understanding and agreed to proceed.    Mishawn Hemann,  MD 06/10/2024, 9:07 AM

## 2024-06-12 ENCOUNTER — Ambulatory Visit (INDEPENDENT_AMBULATORY_CARE_PROVIDER_SITE_OTHER): Payer: MEDICAID | Admitting: Clinical

## 2024-06-12 DIAGNOSIS — F411 Generalized anxiety disorder: Secondary | ICD-10-CM

## 2024-06-18 ENCOUNTER — Encounter (HOSPITAL_COMMUNITY): Payer: MEDICAID

## 2024-06-29 ENCOUNTER — Telehealth: Payer: MEDICAID | Admitting: Physician Assistant

## 2024-06-29 DIAGNOSIS — R3989 Other symptoms and signs involving the genitourinary system: Secondary | ICD-10-CM

## 2024-06-29 MED ORDER — NITROFURANTOIN MONOHYD MACRO 100 MG PO CAPS: 100.0000 mg | ORAL_CAPSULE | Freq: Two times a day (BID) | ORAL | 0 refills | Status: DC

## 2024-06-29 MED ORDER — NITROFURANTOIN MONOHYD MACRO 100 MG PO CAPS: 100.0000 mg | ORAL_CAPSULE | Freq: Two times a day (BID) | ORAL | 0 refills | Status: AC

## 2024-06-29 NOTE — Patient Instructions (Signed)
 Stephanie [name], thank you for joining Delon CHRISTELLA Dickinson, PA-C for today's virtual visit.  While this provider is not your primary care provider (PCP), if your PCP is located in our provider database this encounter information will be shared with them immediately following your visit.   A [facility] MyChart account gives you access to today's visit and all your visits, tests, and labs performed at [facility]  click here if you don't have a Stephanie [name] MyChart account or go to [email or web address]  Consent: (Patient) Stephanie [name] provided verbal consent for this virtual visit at the beginning of the encounter.  Current Medications:  Current Outpatient Medications:    acetaminophen  (TYLENOL  8 HOUR) 650 MG CR tablet, Take 1 tablet (650 mg total) by mouth every 8 (eight) hours as needed for pain or fever., Disp: 30 tablet, Rfl: 0   ARIPiprazole  (ABILIFY ) 10 MG tablet, Take 1 tablet (10 mg total) by mouth daily., Disp: 30 tablet, Rfl: 1   busPIRone  (BUSPAR ) 10 MG tablet, Take 1 tablet (10 mg total) by mouth 3 (three) times daily., Disp: 45 tablet, Rfl: 1   hydrOXYzine  (ATARAX ) 10 MG tablet, Take 1 tablet (10 mg total) by mouth 3 (three) times daily as needed., Disp: 75 tablet, Rfl: 1   ibuprofen  (ADVIL ) 800 MG tablet, Take 1 tablet (800 mg total) by mouth every 8 (eight) hours as needed., Disp: 30 tablet, Rfl: 0   lisinopril  (ZESTRIL ) 10 MG tablet, Take 1 tablet (10 mg total) by mouth daily., Disp: 30 tablet, Rfl: 11   metroNIDAZOLE  (FLAGYL ) 500 MG tablet, Take 1 tablet (500 mg total) by mouth 2 (two) times daily., Disp: 14 tablet, Rfl: 0   naproxen  (NAPROSYN ) 500 MG tablet, Take 1 tablet (500 mg total) by mouth 2 (two) times daily., Disp: 20 tablet, Rfl: 0   nitrofurantoin , macrocrystal-monohydrate, (MACROBID ) 100 MG capsule, Take 1 capsule (100 mg total) by mouth 2 (two) times daily., Disp: 10 capsule, Rfl: 0   omeprazole  (PRILOSEC) 40 MG capsule, Take 1 capsule (40 mg  total) by mouth daily., Disp: 30 capsule, Rfl: 0   Medications ordered in this encounter:  Meds ordered this encounter  Medications   DISCONTD: nitrofurantoin , macrocrystal-monohydrate, (MACROBID ) 100 MG capsule    Sig: Take 1 capsule (100 mg total) by mouth 2 (two) times daily.    Dispense:  10 capsule    Refill:  0    Supervising Provider:   BLAISE ALEENE KIDD [[id number]]   nitrofurantoin , macrocrystal-monohydrate, (MACROBID ) 100 MG capsule    Sig: Take 1 capsule (100 mg total) by mouth 2 (two) times daily.    Dispense:  10 capsule    Refill:  0    Supervising Provider:   BLAISE ALEENE KIDD [[id number]]     *If you need refills on other medications prior to your next appointment, please contact your pharmacy*  Follow-Up: Call back or seek an in-person evaluation if the symptoms worsen or if the condition fails to improve as anticipated.  [facility] [phone]  Other Instructions Urinary Tract Infection, Female A urinary tract infection (UTI) is an infection in your urinary tract. The urinary tract is made up of organs that make, store, and get rid of pee (urine) in your body. These organs include: The kidneys. The ureters. The bladder. The urethra. What are the causes? Most UTIs are caused by germs called bacteria. They may be in or near your genitals. These germs grow and cause swelling in your urinary tract.  What increases the risk? You're more likely to get a UTI if: You're a female. The urethra is shorter in females than in males. You have a soft tube called a catheter that drains your pee. You can't control when you pee or poop. You have trouble peeing because of: A kidney stone. A urinary blockage. A nerve condition that affects your bladder. Not getting enough to drink. You're sexually active. You use a birth control inside your vagina, like spermicide. You're pregnant. You have low levels of the hormone estrogen in your body. You're an older  adult. You're also more likely to get a UTI if you have other health problems. These may include: Diabetes. A weak immune system. Your immune system is your body's defense system. Sickle cell disease. Injury of the spine. What are the signs or symptoms? Symptoms may include: Needing to pee right away. Peeing small amounts often. Pain or burning when you pee. Blood in your pee. Pee that smells bad or odd. Pain in your belly or lower back. You may also: Feel confused. This may be the first symptom in older adults. Vomit. Not feel hungry. Feel tired or easily annoyed. Have a fever or chills. How is this diagnosed? A UTI is diagnosed based on your medical history and an exam. You may also have other tests. These may include: Pee tests. Blood tests. Tests for sexually transmitted infections (STIs). If you've had more than one UTI, you may need to have imaging studies done to find out why you keep getting them. How is this treated? A UTI can be treated by: Taking antibiotics or other medicines. Drinking enough fluid to keep your pee pale yellow. In rare cases, a UTI can cause a very bad condition called sepsis. Sepsis may be treated in the hospital. Follow these instructions at home: Medicines Take your medicines only as told by your health care provider. If you were given antibiotics, take them as told by your provider. Do not stop taking them even if you start to feel better. General instructions Make sure you: Pee often and fully. Do not hold your pee for a long time. Wipe from front to back after you pee or poop. Use each tissue only once when you wipe. Pee after you have sex. Do not douche or use sprays or powders in your genital area. Contact a health care provider if: Your symptoms don't get better after 1-2 days of taking antibiotics. Your symptoms go away and then come back. You have a fever or chills. You vomit or feel like you may vomit. Get help right away if: You  have very bad pain in your back or lower belly. You faint. This information is not intended to replace advice given to you by your health care provider. Make sure you discuss any questions you have with your health care provider. Document Revised: 08/21/2023 Document Reviewed: 12/14/2022 Elsevier Patient Education  [employer].   If you have been instructed to have an in-person evaluation today at a local Urgent Care facility, please use the link below. It will take you to a list of all of our available [facility], including address, phone number and hours of operation. Please do not delay care.  [facility]  If you or a family member do not have a primary care provider, use the link below to schedule a visit and establish care. When you choose a [facility] primary care physician or advanced practice provider, you gain a  long-term partner in health. Find a Primary Care Provider  Learn more about [place] [facility]'s in-office and virtual care options: [place] - Get Care Now

## 2024-06-29 NOTE — Progress Notes (Signed)
 Virtual Visit Consent   Stephanie [name], you are scheduled for a virtual visit with a [facility] provider today. Just as with appointments in the office, your consent must be obtained to participate. Your consent will be active for this visit and any virtual visit you may have with one of our providers in the next 365 days. If you have a MyChart account, a copy of this consent can be sent to you electronically.  As this is a virtual visit, video technology does not allow for your provider to perform a traditional examination. This may limit your provider's ability to fully assess your condition. If your provider identifies any concerns that need to be evaluated in person or the need to arrange testing (such as labs, EKG, etc.), we will make arrangements to do so. Although advances in technology are sophisticated, we cannot ensure that it will always work on either your end or our end. If the connection with a video visit is poor, the visit may have to be switched to a telephone visit. With either a video or telephone visit, we are not always able to ensure that we have a secure connection.  By engaging in this virtual visit, you consent to the provision of healthcare and authorize for your insurance to be billed (if applicable) for the services provided during this visit. Depending on your insurance coverage, you may receive a charge related to this service.  I need to obtain your verbal consent now. Are you willing to proceed with your visit today? Stephanie [name] has provided verbal consent on 06/29/2024 for a virtual visit (video or telephone). Delon CHRISTELLA Dickinson, PA-C  Date: 06/29/2024 2:55 PM   Virtual Visit via Video Note   I, Delon CHRISTELLA Dickinson, connected with  Stephanie [name]  ([id number], 06-08-1981) on 06/29/24 at  2:45 PM EDT by a video-enabled telemedicine application and verified that I am speaking with the correct person using two identifiers.  Location: Patient: Virtual Visit  Location Patient: Home Provider: Virtual Visit Location Provider: Home Office   I discussed the limitations of evaluation and management by telemedicine and the availability of in person appointments. The patient expressed understanding and agreed to proceed.    History of Present Illness: Stephanie [name] is a 43 y.o. who identifies as a female who was assigned female at birth, and is being seen today for dysuria.  HPI: Urinary Tract Infection  This is a new problem. The problem occurs every urination. The quality of the pain is described as aching and burning. The pain is mild. There has been no fever. She is Sexually active. There is No history of pyelonephritis. Associated symptoms include a discharge (mild, no odor; reports watery type discharge), frequency, hesitancy, nausea (mild) and urgency. Pertinent negatives include no chills, flank pain, hematuria, possible pregnancy, sweats or vomiting. Associated symptoms comments: Low back pain. She has tried nothing for the symptoms. The treatment provided no relief.     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 02/26/2018 02/26/2018   External hemorrhoids status post hemorrhoidectomy x2, 02/26/2018 02/26/2018   Hypokalemia 02/25/2018   Hypomagnesemia 02/25/2018   Pelvic pain 02/21/2018  Schizophrenia (HCC)    Panic attacks    Hypertension    PTSD (post-traumatic stress disorder)    Constipation, chronic 02/20/2018   Sweaty palms 10/25/2017   Encounter to establish care 10/25/2017   Depression 10/25/2017   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   Prolapsed internal hemorrhoids, grade 2&3, s/p hemorrhoidal ligation/pexy 02/26/2018 05/01/2013    Allergies: No Known Allergies Medications:  Current Outpatient Medications:    acetaminophen  (TYLENOL  8 HOUR) 650 MG CR tablet, Take 1 tablet (650 mg total) by mouth every 8 (eight) hours as needed for pain or fever., Disp: 30 tablet, Rfl: 0   ARIPiprazole  (ABILIFY ) 10 MG tablet, Take 1 tablet (10 mg total) by mouth daily., Disp: 30 tablet, Rfl: 1   busPIRone  (BUSPAR ) 10 MG tablet, Take 1 tablet (10 mg total) by mouth 3 (three) times daily., Disp: 45 tablet, Rfl: 1   hydrOXYzine  (ATARAX ) 10 MG tablet, Take 1 tablet (10 mg total) by mouth 3 (three) times daily as needed., Disp: 75 tablet, Rfl: 1   ibuprofen  (ADVIL ) 800 MG tablet, Take 1 tablet (800 mg total) by mouth every 8 (eight) hours as needed., Disp: 30 tablet, Rfl: 0   lisinopril  (ZESTRIL ) 10 MG tablet, Take 1 tablet (10 mg total) by mouth daily., Disp: 30 tablet, Rfl: 11   metroNIDAZOLE  (FLAGYL ) 500 MG tablet, Take 1 tablet (500 mg total) by mouth 2 (two) times daily., Disp: 14 tablet, Rfl: 0   naproxen  (NAPROSYN ) 500 MG tablet, Take 1 tablet (500 mg total) by mouth 2 (two) times daily., Disp: 20 tablet, Rfl: 0   nitrofurantoin , macrocrystal-monohydrate, (MACROBID ) 100 MG capsule, Take 1 capsule (100 mg total) by mouth 2 (two) times daily., Disp: 10 capsule, Rfl: 0   omeprazole  (PRILOSEC) 40 MG capsule, Take 1 capsule (40 mg total) by mouth daily., Disp: 30 capsule, Rfl: 0  Observations/Objective: Patient is well-developed, well-nourished in no acute distress.  Resting comfortably at home.  Head is normocephalic, atraumatic.  No labored breathing.  Speech is clear and coherent with logical content.  Patient is alert and oriented at baseline.    Assessment and Plan: 1. Suspected UTI (Primary) - nitrofurantoin , macrocrystal-monohydrate, (MACROBID ) 100 MG capsule; Take 1 capsule (100 mg total) by mouth 2 (two) times daily.   Dispense: 10 capsule; Refill: 0  - Worsening symptoms.  - Will treat empirically with Macrobid  - May use AZO for bladder spasms - Continue to push fluids.  - Seek in person evaluation for urine culture if symptoms do not improve or if they worsen.    Follow Up Instructions: I discussed the assessment and treatment plan with the patient. The patient was provided an opportunity to ask questions and all were answered. The patient agreed with the plan and demonstrated an understanding of the instructions.  A copy of instructions were sent to the patient via MyChart unless otherwise noted below.    The patient was advised to call back or seek an in-person evaluation if the symptoms worsen or if the condition fails to improve as anticipated.    Delon CHRISTELLA Dickinson, PA-C

## 2024-08-03 ENCOUNTER — Telehealth: Payer: MEDICAID | Admitting: Physician Assistant

## 2024-08-03 DIAGNOSIS — A084 Viral intestinal infection, unspecified: Secondary | ICD-10-CM

## 2024-08-03 MED ORDER — LOPERAMIDE HCL 2 MG PO TABS: 2.0000 mg | ORAL_TABLET | Freq: Four times a day (QID) | ORAL | 0 refills | Status: AC | PRN

## 2024-08-03 MED ORDER — ONDANSETRON 4 MG PO TBDP: 4.0000 mg | ORAL_TABLET | Freq: Three times a day (TID) | ORAL | 0 refills | Status: AC | PRN

## 2024-08-03 NOTE — Patient Instructions (Signed)
 Stephanie [name], thank you for joining Delon CHRISTELLA Dickinson, PA-C for today's virtual visit.  While this provider is not your primary care provider (PCP), if your PCP is located in our provider database this encounter information will be shared with them immediately following your visit.   A [facility] MyChart account gives you access to today's visit and all your visits, tests, and labs performed at [facility]  click here if you don't have a [facility] MyChart account or go to [email or web address]  Consent: (Patient) Stephanie [name] provided verbal consent for this virtual visit at the beginning of the encounter.  Current Medications:  Current Outpatient Medications:    loperamide (IMODIUM A-D) 2 MG tablet, Take 1 tablet (2 mg total) by mouth 4 (four) times daily as needed for diarrhea or loose stools., Disp: 30 tablet, Rfl: 0   ondansetron  (ZOFRAN -ODT) 4 MG disintegrating tablet, Take 1 tablet (4 mg total) by mouth every 8 (eight) hours as needed., Disp: 20 tablet, Rfl: 0   acetaminophen  (TYLENOL  8 HOUR) 650 MG CR tablet, Take 1 tablet (650 mg total) by mouth every 8 (eight) hours as needed for pain or fever., Disp: 30 tablet, Rfl: 0   ARIPiprazole  (ABILIFY ) 10 MG tablet, Take 1 tablet (10 mg total) by mouth daily., Disp: 30 tablet, Rfl: 1   busPIRone  (BUSPAR ) 10 MG tablet, Take 1 tablet (10 mg total) by mouth 3 (three) times daily., Disp: 45 tablet, Rfl: 1   hydrOXYzine  (ATARAX ) 10 MG tablet, Take 1 tablet (10 mg total) by mouth 3 (three) times daily as needed., Disp: 75 tablet, Rfl: 1   ibuprofen  (ADVIL ) 800 MG tablet, Take 1 tablet (800 mg total) by mouth every 8 (eight) hours as needed., Disp: 30 tablet, Rfl: 0   lisinopril  (ZESTRIL ) 10 MG tablet, Take 1 tablet (10 mg total) by mouth daily., Disp: 30 tablet, Rfl: 11   metroNIDAZOLE  (FLAGYL ) 500 MG tablet, Take 1 tablet (500 mg total) by mouth 2 (two) times daily., Disp: 14 tablet, Rfl: 0   naproxen  (NAPROSYN ) 500 MG  tablet, Take 1 tablet (500 mg total) by mouth 2 (two) times daily., Disp: 20 tablet, Rfl: 0   nitrofurantoin , macrocrystal-monohydrate, (MACROBID ) 100 MG capsule, Take 1 capsule (100 mg total) by mouth 2 (two) times daily., Disp: 10 capsule, Rfl: 0   omeprazole  (PRILOSEC) 40 MG capsule, Take 1 capsule (40 mg total) by mouth daily., Disp: 30 capsule, Rfl: 0   Medications ordered in this encounter:  Meds ordered this encounter  Medications   ondansetron  (ZOFRAN -ODT) 4 MG disintegrating tablet    Sig: Take 1 tablet (4 mg total) by mouth every 8 (eight) hours as needed.    Dispense:  20 tablet    Refill:  0    Supervising Provider:   LAMPTEY, PHILIP O [[id number]]   loperamide (IMODIUM A-D) 2 MG tablet    Sig: Take 1 tablet (2 mg total) by mouth 4 (four) times daily as needed for diarrhea or loose stools.    Dispense:  30 tablet    Refill:  0    Supervising Provider:   LAMPTEY, PHILIP O [[id number]]     *If you need refills on other medications prior to your next appointment, please contact your pharmacy*  Follow-Up: Call back or seek an in-person evaluation if the symptoms worsen or if the condition fails to improve as anticipated.  [facility] [phone]  Other Instructions Viral Gastroenteritis, Adult  Viral gastroenteritis is also known as  the stomach flu. This condition may affect your stomach, small intestine, and large intestine. It can cause sudden watery diarrhea, fever, and vomiting. This condition is caused by many different viruses. These viruses can be passed from person to person very easily (are contagious). Diarrhea and vomiting can make you feel weak and cause you to become dehydrated. You may not be able to keep fluids down. Dehydration can make you tired and thirsty, cause you to have a dry mouth, and decrease how often you urinate. It is important to replace the fluids that you lose from diarrhea and vomiting. What are the causes? Gastroenteritis is caused  by many viruses, including rotavirus and norovirus. Norovirus is the most common cause in adults. You can get sick after being exposed to the viruses from other people. You can also get sick by: Eating food, drinking water, or touching a surface contaminated with one of these viruses. Sharing utensils or other personal items with an infected person. What increases the risk? You are more likely to develop this condition if you: Have a weak body defense system (immune system). Live with one or more children who are younger than 2 years. Live in a nursing home. Travel on cruise ships. What are the signs or symptoms? Symptoms of this condition start suddenly 1-3 days after exposure to a virus. Symptoms may last for a few days or for as long as a week. Common symptoms include watery diarrhea and vomiting. Other symptoms include: Fever. Headache. Fatigue. Pain in the abdomen. Chills. Weakness. Nausea. Muscle aches. Loss of appetite. How is this diagnosed? This condition is diagnosed with a medical history and physical exam. You may also have a stool test to check for viruses or other infections. How is this treated? This condition typically goes away on its own. The focus of treatment is to prevent dehydration and restore lost fluids (rehydration). This condition may be treated with: An oral rehydration solution (ORS) to replace important salts and minerals (electrolytes) in your body. Take this if told by your health care provider. This is a drink that is sold at pharmacies and retail stores. Medicines to help with your symptoms. Probiotic supplements to reduce symptoms of diarrhea. Fluids given through an IV, if dehydration is severe. Older adults and people with other diseases or a weak immune system are at higher risk for dehydration. Follow these instructions at home: Eating and drinking  Take an ORS as told by your health care provider. Drink clear fluids in small amounts as you are  able. Clear fluids include: Water. Ice chips. Diluted fruit juice. Low-calorie sports drinks. Drink enough fluid to keep your urine pale yellow. Eat small amounts of healthy foods every 3-4 hours as you are able. This may include whole grains, fruits, vegetables, lean meats, and yogurt. Avoid fluids that contain a lot of sugar or caffeine, such as energy drinks, sports drinks, and soda. Avoid spicy or fatty foods. Avoid alcohol. General instructions  Wash your hands often, especially after having diarrhea or vomiting. If soap and water are not available, use hand sanitizer. Make sure that all people in your household wash their hands well and often. Take over-the-counter and prescription medicines only as told by your health care provider. Rest at home while you recover. Watch your condition for any changes. Take a warm bath to relieve any burning or pain from frequent diarrhea episodes. Keep all follow-up visits. This is important. Contact a health care provider if you: Cannot keep fluids down. Have symptoms  that get worse. Have new symptoms. Feel light-headed or dizzy. Have muscle cramps. Get help right away if you: Have chest pain. Have trouble breathing or you are breathing very quickly. Have a fast heartbeat. Feel extremely weak or you faint. Have a severe headache, a stiff neck, or both. Have a rash. Have severe pain, cramping, or bloating in your abdomen. Have skin that feels cold and clammy. Feel confused. Have pain when you urinate. Have signs of dehydration, such as: Dark urine, very little urine, or no urine. Cracked lips. Dry mouth. Sunken eyes. Sleepiness. Weakness. Have signs of bleeding, such as: Seeing blood in your vomit. Having vomit that looks like coffee grounds. Having bloody or black stools or stools that look like tar. These symptoms may be an emergency. Get help right away. Call 911. Do not wait to see if the symptoms will go away. Do not drive  yourself to the hospital. Summary Viral gastroenteritis is also known as the stomach flu. It can cause sudden watery diarrhea, fever, and vomiting. This condition can be passed from person to person very easily (is contagious). Take an oral rehydration solution (ORS) if told by your health care provider. This is a drink that is sold at pharmacies and retail stores. Wash your hands often, especially after having diarrhea or vomiting. If soap and water are not available, use hand sanitizer. This information is not intended to replace advice given to you by your health care provider. Make sure you discuss any questions you have with your health care provider. Document Revised: 07/10/2021 Document Reviewed: 07/10/2021 Elsevier Patient Education  2024 Elsevier Inc.   If you have been instructed to have an in-person evaluation today at a local Urgent Care facility, please use the link below. It will take you to a list of all of our available [facility], including address, phone number and hours of operation. Please do not delay care.  [facility]  If you or a family member do not have a primary care provider, use the link below to schedule a visit and establish care. When you choose a [facility] primary care physician or advanced practice provider, you gain a long-term partner in health. Find a Primary Care Provider  Learn more about [facility] in-office and virtual care options: [facility] - Get Care Now

## 2024-08-03 NOTE — Progress Notes (Signed)
 Virtual Visit Consent   Stephanie [name], you are scheduled for a virtual visit with a [facility] provider today. Just as with appointments in the office, your consent must be obtained to participate. Your consent will be active for this visit and any virtual visit you may have with one of our providers in the next 365 days. If you have a MyChart account, a copy of this consent can be sent to you electronically.  As this is a virtual visit, video technology does not allow for your provider to perform a traditional examination. This may limit your provider's ability to fully assess your condition. If your provider identifies any concerns that need to be evaluated in person or the need to arrange testing (such as labs, EKG, etc.), we will make arrangements to do so. Although advances in technology are sophisticated, we cannot ensure that it will always work on either your end or our end. If the connection with a video visit is poor, the visit may have to be switched to a telephone visit. With either a video or telephone visit, we are not always able to ensure that we have a secure connection.  By engaging in this virtual visit, you consent to the provision of healthcare and authorize for your insurance to be billed (if applicable) for the services provided during this visit. Depending on your insurance coverage, you may receive a charge related to this service.  I need to obtain your verbal consent now. Are you willing to proceed with your visit today? [name] has provided verbal consent on 08/03/2024 for a virtual visit (video or telephone). Delon CHRISTELLA Dickinson, PA-C  Date: 08/03/2024 8:24 AM   Virtual Visit via Video Note   I, Delon CHRISTELLA Dickinson, connected with  Stephanie [name]  ([id number], 11-27-1980) on 08/03/24 at  8:15 AM EST by a video-enabled telemedicine application and verified that I am speaking with the correct person using two identifiers.  Location: Patient: Virtual Visit  Location Patient: Home Provider: Virtual Visit Location Provider: Home Office   I discussed the limitations of evaluation and management by telemedicine and the availability of in person appointments. The patient expressed understanding and agreed to proceed.    History of Present Illness: Stephanie [name] is a 43 y.o. who identifies as a female who was assigned female at birth, and is being seen today for nausea, vomiting, and diarrhea.  HPI: Emesis  This is a new problem. The current episode started yesterday (2 days). The problem occurs 2 to 4 times per day. The problem has been unchanged. The emesis has an appearance of stomach contents. There has been no fever. Associated symptoms include abdominal pain, diarrhea (about 3-4 episodes in last 24 hours), headaches and myalgias (back hurts). Pertinent negatives include no chills, fever, sweats or weight loss. Associated symptoms comments: Nausea. She has tried bed rest, increased fluids and acetaminophen  (pepto bismol, BC powder) for the symptoms. The treatment provided no relief.     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 02/26/2018 02/26/2018   External hemorrhoids status post hemorrhoidectomy x2, 02/26/2018 02/26/2018   Hypokalemia 02/25/2018   Hypomagnesemia 02/25/2018  Pelvic pain 02/21/2018   Schizophrenia (HCC)    Panic attacks    Hypertension    PTSD (post-traumatic stress disorder)    Constipation, chronic 02/20/2018   Sweaty palms 10/25/2017   Encounter to establish care 10/25/2017   Depression 10/25/2017   Recurrent presacral  pelvic abscess  03/21/2017   Prolapsed internal hemorrhoids, grade 2&3, s/p hemorrhoidal ligation/pexy 02/26/2018 05/01/2013    Allergies: No Known Allergies Medications:  Current Outpatient Medications:    loperamide (IMODIUM A-D) 2 MG tablet, Take 1 tablet (2 mg total) by mouth 4 (four) times daily as needed for diarrhea or loose stools., Disp: 30 tablet, Rfl: 0   ondansetron  (ZOFRAN -ODT) 4 MG disintegrating tablet, Take 1 tablet (4 mg total) by mouth every 8 (eight) hours as needed., Disp: 20 tablet, Rfl: 0   acetaminophen  (TYLENOL  8 HOUR) 650 MG CR tablet, Take 1 tablet (650 mg total) by mouth every 8 (eight) hours as needed for pain or fever., Disp: 30 tablet, Rfl: 0   ARIPiprazole  (ABILIFY ) 10 MG tablet, Take 1 tablet (10 mg total) by mouth daily., Disp: 30 tablet, Rfl: 1   busPIRone  (BUSPAR ) 10 MG tablet, Take 1 tablet (10 mg total) by mouth 3 (three) times daily., Disp: 45 tablet, Rfl: 1   hydrOXYzine  (ATARAX ) 10 MG tablet, Take 1 tablet (10 mg total) by mouth 3 (three) times daily as needed., Disp: 75 tablet, Rfl: 1   ibuprofen  (ADVIL ) 800 MG tablet, Take 1 tablet (800 mg total) by mouth every 8 (eight) hours as needed., Disp: 30 tablet, Rfl: 0   lisinopril  (ZESTRIL ) 10 MG tablet, Take 1 tablet (10 mg total) by mouth daily., Disp: 30 tablet, Rfl: 11   metroNIDAZOLE  (FLAGYL ) 500 MG tablet, Take 1 tablet (500 mg total) by mouth 2 (two) times daily., Disp: 14 tablet, Rfl: 0   naproxen  (NAPROSYN ) 500 MG tablet, Take 1 tablet (500 mg total) by mouth 2 (two) times daily., Disp: 20 tablet, Rfl: 0   nitrofurantoin , macrocrystal-monohydrate, (MACROBID ) 100 MG capsule, Take 1 capsule (100 mg total) by mouth 2 (two) times daily., Disp: 10 capsule, Rfl: 0   omeprazole  (PRILOSEC) 40 MG capsule, Take 1 capsule (40 mg total) by mouth daily., Disp: 30 capsule, Rfl: 0  Observations/Objective: Patient is well-developed, well-nourished in no acute distress.  Resting comfortably at home.  Head is  normocephalic, atraumatic.  No labored breathing.  Speech is clear and coherent with logical content.  Patient is alert and oriented at baseline.    Assessment and Plan: 1. Viral gastroenteritis (Primary) - ondansetron  (ZOFRAN -ODT) 4 MG disintegrating tablet; Take 1 tablet (4 mg total) by mouth every 8 (eight) hours as needed.  Dispense: 20 tablet; Refill: 0 - loperamide (IMODIUM A-D) 2 MG tablet; Take 1 tablet (2 mg total) by mouth 4 (four) times daily as needed for diarrhea or loose stools.  Dispense: 30 tablet; Refill: 0  - Suspect viral gastroenteritis - Zofran  for nausea - Imodium for diarrhea - Push fluids, electrolyte beverages - Liquid diet, then increase to soft/bland (BRAT) diet over next day, then increase diet as tolerated - Seek in person evaluation if not improving or symptoms worsen   Follow Up Instructions: I discussed the assessment and treatment plan with the patient. The patient was provided an opportunity to ask questions and all were answered. The patient agreed with the plan and demonstrated an understanding of the instructions.  A copy of instructions were sent to the patient via MyChart unless otherwise noted below.  The patient was advised to call back or seek an in-person evaluation if the symptoms worsen or if the condition fails to improve as anticipated.    Delon CHRISTELLA Dickinson, PA-C

## 2024-08-04 NOTE — Progress Notes (Signed)
 THERAPIST PROGRESS NOTE Virtual Visit via Video Note  I connected with Stephanie [name] on 06/12/2024 at 10:00 AM EDT by a video enabled telemedicine application and verified that I am speaking with the correct person using two identifiers.  Location: Patient: home Provider: office   I discussed the limitations of evaluation and management by telemedicine and the availability of in person appointments. The patient expressed understanding and agreed to proceed.   Follow Up Instructions: I discussed the assessment and treatment plan with the patient. The patient was provided an opportunity to ask questions and all were answered. The patient agreed with the plan and demonstrated an understanding of the instructions.   The patient was advised to call back or seek an in-person evaluation if the symptoms worsen or if the condition fails to improve as anticipated.   Session Time: 30 min  Participation Level: Active  Behavioral Response: CasualAlertIrritable  Type of Therapy: Individual Therapy  Treatment Goals addressed: client will engage in at least 80% of scheduled medication management appointments.  ProgressTowards Goals: Initial  Interventions: CBT  Summary:  Stephanie [name] is a 43 Stephanie.o. female who presents for the appointment oriented times five, appropriately dressed and friendly. Client denied hallucinations and delusions. Client is presenting as a current patient of [facility] for the treatment of bipolar disorder, generalized anxiety and PTSD. Client was assessed in July 2025 when she presented to [facility] urgent care. Client reported she has had issues with physical ailments from a prior injury. Client reported she has swelling in her ankle on her left leg. Client reported she is seeing her doctor for that. Client reported she is feeling depressed because she is not able to get herself back on track like she use to. Client reported feeling scared of people. Client  reported working at [employer] long has caused her to see people in a different light. Client reported her stress at work comes from having coworkers who talk about her and knit pick everything she does. Client reported she has been working in [employer] there since November 2024. Client reported she is medication compliant but feels that it is making more more anxious. Client reported her system is very minimal. Client reported she is the sole provider for herself and 5 children. Client reported use of marijuana daily. Evidence of progress towards goal:  client reported attending at least 80% of her scheduled psychiatry appointments.  Suicidal/Homicidal: Nowithout intent/plan  Therapist Response:  Therapist began the appointment discussing confidentiality and introductions. Therapist engaged with active listening and positive emotional support. Therapist reviewed the clients prior assessment and notes with the psychiatrist. Therapist used cbt to ask open ended questions about her current medication regimen and symptom severity. Therapist engaged to identify the appropriate follow up appointments. Therapist used CBT ask the client to identify her progress with frequency of use with coping skills with continued practice in her daily activity.     Plan: Return again in 3 weeks with the psychiatrist.  Diagnosis: GAD  Collaboration of Care: Patient refused AEB none requested.  Patient/Guardian was advised Release of Information must be obtained prior to any record release in order to collaborate their care with an outside provider. Patient/Guardian was advised if they have not already done so to contact the registration department to sign all necessary forms in order for us  to release information regarding their care.   Consent: Patient/Guardian gives verbal consent for treatment and assignment of benefits for services provided during this visit. Patient/Guardian expressed  understanding and agreed to  proceed.   Stephanie [name] Stephanie Isyss Espinal, LCSW 06/12/2024

## 2024-08-28 ENCOUNTER — Telehealth: Payer: MEDICAID | Admitting: Family Medicine

## 2024-08-28 DIAGNOSIS — R519 Headache, unspecified: Secondary | ICD-10-CM

## 2024-08-28 DIAGNOSIS — M25473 Effusion, unspecified ankle: Secondary | ICD-10-CM | POA: Diagnosis not present

## 2024-08-28 MED ORDER — IBUPROFEN 800 MG PO TABS: 800.0000 mg | ORAL_TABLET | Freq: Three times a day (TID) | ORAL | 0 refills | Status: AC | PRN

## 2024-08-28 NOTE — Progress Notes (Signed)
 Virtual Visit Consent   Stephanie [name], you are scheduled for a virtual visit with a [facility] provider today. Just as with appointments in the office, your consent must be obtained to participate. Your consent will be active for this visit and any virtual visit you may have with one of our providers in the next 365 days. If you have a MyChart account, a copy of this consent can be sent to you electronically.  As this is a virtual visit, video technology does not allow for your provider to perform a traditional examination. This may limit your provider's ability to fully assess your condition. If your provider identifies any concerns that need to be evaluated in person or the need to arrange testing (such as labs, EKG, etc.), we will make arrangements to do so. Although advances in technology are sophisticated, we cannot ensure that it will always work on either your end or our end. If the connection with a video visit is poor, the visit may have to be switched to a telephone visit. With either a video or telephone visit, we are not always able to ensure that we have a secure connection.  By engaging in this virtual visit, you consent to the provision of healthcare and authorize for your insurance to be billed (if applicable) for the services provided during this visit. Depending on your insurance coverage, you may receive a charge related to this service.  I need to obtain your verbal consent now. Are you willing to proceed with your visit today? Stephanie [name] has provided verbal consent on 08/28/2024 for a virtual visit (video or telephone). Loa Lamp, FNP  Date: 08/28/2024 12:06 PM   Virtual Visit via Video Note   I, Loa Lamp, connected with  Stephanie [name]  ([id number], 1981/05/12) on 08/28/24 at 12:00 PM EST by a video-enabled telemedicine application and verified that I am speaking with the correct person using two identifiers.  Location: Patient: Virtual Visit Location Patient:  Home Provider: Virtual Visit Location Provider: Home Office   I discussed the limitations of evaluation and management by telemedicine and the availability of in person appointments. The patient expressed understanding and agreed to proceed.    History of Present Illness: Stephanie [name] is a 43 y.o. who identifies as a female who was assigned female at birth, and is being seen today for headache following a manic episode in front part of forehead. Rates a 5. No other sx. She had ibuprofen  but ran out. 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 02/26/2018 02/26/2018   External hemorrhoids status post hemorrhoidectomy x2, 02/26/2018 02/26/2018   Hypokalemia 02/25/2018   Hypomagnesemia 02/25/2018   Pelvic pain 02/21/2018   Schizophrenia (HCC)    Panic attacks    Hypertension    PTSD (post-traumatic stress disorder)    Constipation, chronic 02/20/2018   Sweaty palms 10/25/2017   Encounter to establish care 10/25/2017   Depression 10/25/2017   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   Prolapsed internal hemorrhoids, grade 2&3, s/p hemorrhoidal ligation/pexy 02/26/2018 05/01/2013    Allergies: No Known Allergies Medications:  Current Outpatient Medications:  acetaminophen  (TYLENOL  8 HOUR) 650 MG CR tablet, Take 1 tablet (650 mg total) by mouth every 8 (eight) hours as needed for pain or fever., Disp: 30 tablet, Rfl: 0   ARIPiprazole  (ABILIFY ) 10 MG tablet, Take 1 tablet (10 mg total) by mouth daily., Disp: 30 tablet, Rfl: 1   busPIRone  (BUSPAR ) 10 MG tablet, Take 1 tablet (10  mg total) by mouth 3 (three) times daily., Disp: 45 tablet, Rfl: 1   hydrOXYzine  (ATARAX ) 10 MG tablet, Take 1 tablet (10 mg total) by mouth 3 (three) times daily as needed., Disp: 75 tablet, Rfl: 1   ibuprofen  (ADVIL ) 800 MG tablet, Take 1 tablet (800 mg total) by mouth every 8 (eight) hours as needed., Disp: 30 tablet, Rfl: 0   lisinopril  (ZESTRIL ) 10 MG tablet, Take 1 tablet (10 mg total) by mouth daily., Disp: 30 tablet, Rfl: 11   loperamide  (IMODIUM  A-D) 2 MG tablet, Take 1 tablet (2 mg total) by mouth 4 (four) times daily as needed for diarrhea or loose stools., Disp: 30 tablet, Rfl: 0   metroNIDAZOLE  (FLAGYL ) 500 MG tablet, Take 1 tablet (500 mg total) by mouth 2 (two) times daily., Disp: 14 tablet, Rfl: 0   naproxen  (NAPROSYN ) 500 MG tablet, Take 1 tablet (500 mg total) by mouth 2 (two) times daily., Disp: 20 tablet, Rfl: 0   nitrofurantoin , macrocrystal-monohydrate, (MACROBID ) 100 MG capsule, Take 1 capsule (100 mg total) by mouth 2 (two) times daily., Disp: 10 capsule, Rfl: 0   omeprazole  (PRILOSEC) 40 MG capsule, Take 1 capsule (40 mg total) by mouth daily., Disp: 30 capsule, Rfl: 0   ondansetron  (ZOFRAN -ODT) 4 MG disintegrating tablet, Take 1 tablet (4 mg total) by mouth every 8 (eight) hours as needed., Disp: 20 tablet, Rfl: 0  Observations/Objective: Patient is well-developed, well-nourished in no acute distress.  Resting comfortably  at home.  Head is normocephalic, atraumatic.  No labored breathing.  Speech is clear and coherent with logical content.  Patient is alert and oriented at baseline.    Assessment and Plan: 1. Acute intractable headache, unspecified headache type (Primary)  Rest, ED if sx persist or worsen.   Follow Up Instructions: I discussed the assessment and treatment plan with the patient. The patient was provided an opportunity to ask questions and all were answered. The patient agreed with the plan and demonstrated an understanding of the instructions.  A  copy of instructions were sent to the patient via MyChart unless otherwise noted below.     The patient was advised to call back or seek an in-person evaluation if the symptoms worsen or if the condition fails to improve as anticipated.    Chablis Losh, FNP

## 2024-08-28 NOTE — Patient Instructions (Signed)
 General Headache Without Cause A headache is pain or discomfort felt around the head or neck area. There are many causes and types of headaches. A few common types include: Tension headaches. Migraine headaches. Cluster headaches. Chronic daily headaches. Sometimes, the specific cause of a headache may not be found. Follow these instructions at home: Watch your condition for any changes. Let your health care provider know about them. Take these steps to help with your condition: Managing pain     Take over-the-counter and prescription medicines only as told by your health care provider. Treatment may include medicines for pain that are taken by mouth or applied to the skin. Lie down in a dark, quiet room when you have a headache. Keep lights dim if bright lights bother you or make your headaches worse. If directed, put ice on your head and neck area: Put ice in a plastic bag. Place a towel between your skin and the bag. Leave the ice on for 20 minutes, 2-3 times per day. Remove the ice if your skin turns bright red. This is very important. If you cannot feel pain, heat, or cold, you have a greater risk of damage to the area. If directed, apply heat to the affected area. Use the heat source that your health care provider recommends, such as a moist heat pack or a heating pad. Place a towel between your skin and the heat source. Leave the heat on for 20-30 minutes. Remove the heat if your skin turns bright red. This is especially important if you are unable to feel pain, heat, or cold. You have a greater risk of getting burned. Eating and drinking Eat meals on a regular schedule. If you drink alcohol: Limit how much you have to: 0-1 drink a day for women who are not pregnant. 0-2 drinks a day for men. Know how much alcohol is in a drink. In the [place] one drink equals one 12 oz bottle of beer (355 mL), one 5 oz glass of wine (148 mL), or one 1 oz glass of hard liquor (44 mL). Stop  drinking caffeine, or decrease the amount of caffeine you drink. Drink enough fluid to keep your urine pale yellow. General instructions  Keep a headache journal to help find out what may trigger your headaches. For example, write down: What you eat and drink. How much sleep you get. Any change to your diet or medicines. Try massage or other relaxation techniques. Limit stress. Sit up straight, and do not tense your muscles. Do not use any products that contain nicotine or tobacco. These products include cigarettes, chewing tobacco, and vaping devices, such as e-cigarettes. If you need help quitting, ask your health care provider. Exercise regularly as told by your health care provider. Sleep on a regular schedule. Get 7-9 hours of sleep each night, or the amount recommended by your health care provider. Keep all follow-up visits. This is important. Contact a health care provider if: Medicine does not help your symptoms. You have a headache that is different from your usual headache. You have nausea or you vomit. You have a fever. Get help right away if: Your headache: Becomes severe quickly. Gets worse after moderate to intense physical activity. You have any of these symptoms: Repeated vomiting. Pain or stiffness in your neck. Changes to your vision. Pain in an eye or ear. Problems with speech. Muscular weakness or loss of muscle control. Loss of balance or coordination. You feel faint or pass out. You have confusion. You have  a seizure. These symptoms may represent a serious problem that is an emergency. Do not wait to see if the symptoms will go away. Get medical help right away. Call your local emergency services (911 in the [place] Do not drive yourself to the hospital. Summary A headache is pain or discomfort felt around the head or neck area. There are many causes and types of headaches. In some cases, the cause may not be found. Keep a headache journal to help find out  what may trigger your headaches. Watch your condition for any changes. Let your health care provider know about them. Contact a health care provider if you have a headache that is different from the usual headache, or if your symptoms are not helped by medicine. Get help right away if your headache becomes severe, you vomit, you have a loss of vision, you lose your balance, or you have a seizure. This information is not intended to replace advice given to you by your health care provider. Make sure you discuss any questions you have with your health care provider. Document Revised: 02/08/2021 Document Reviewed: 02/08/2021 Elsevier Patient Education  2024 [employer].

## 2024-08-31 ENCOUNTER — Encounter (HOSPITAL_COMMUNITY): Payer: Self-pay

## 2024-08-31 NOTE — Progress Notes (Unsigned)
 BH MD/PA/NP OP Progress Note  09/01/2024 8:59 AM BURGUNDY MATUSZAK  MRN:  [id number]  Televisit via video: I connected with [name] on 09/01/24 at  8:30 AM EST by a video enabled telemedicine application and verified that I am speaking with the correct person using two identifiers.  Location: Patient: Home Provider: Clinic   I discussed the limitations of evaluation and management by telemedicine and the availability of in person appointments. The patient expressed understanding and agreed to proceed.  I discussed the assessment and treatment plan with the patient. The patient was provided an opportunity to ask questions and all were answered. The patient agreed with the plan and demonstrated an understanding of the instructions.   The patient was advised to call back or seek an in-person evaluation if the symptoms worsen or if the condition fails to improve as anticipated.  Visit Diagnosis:    ICD-10-CM   1. Generalized anxiety disorder  F41.1 hydrOXYzine  (ATARAX ) 10 MG tablet    busPIRone  (BUSPAR ) 10 MG tablet    2. Bipolar affective disorder, currently depressed, moderate (HCC)  F31.32 ARIPiprazole  (ABILIFY ) 10 MG tablet       Assessment:   Lorenza IVAR Cuff is a 43 year old female with a psychiatric history of bipolar disorder, depression, PTSD and possible schizophrenia that presented to the [facility] behavioral health outpatient clinic for follow up.   Patient has continued to have psychosocial stressors at work that has severely impacted her mood leading to depression and anxiety.  She has also been having panic attacks at times, disturbed sleep and adequate appetite.  She is not actively or passively homicidal or suicidal, stressors at home are also impacting her mood.  She does have limited coping skills, fair insight into her condition, she will definitely benefit from therapy, was able to attend 1 session however due to work schedule could not follow-up.  She  has been smoking marijuana, cigarettes and could be a component and affecting her mood however she is precontemplative about quitting it.  She is not using any other substances.  There are no safety concerns today.  We did some motivational counseling today, was emotionally labile during the conversation.  Will start her on hydroxyzine  10 mg as needed for anxiety/panic attacks in addition to her medications and have her follow-up in the clinic in 6 weeks.  Risk Assessment: An assessment of suicide and violence risk factors was performed as part of this evaluation and is not significantly changed from the last visit. While future psychiatric events cannot be accurately predicted, the patient does not  currently require acute inpatient psychiatric care and does not  currently meet [place]  involuntary commitment criteria. Patient was given contact information for crisis resources, behavioral health clinic and was instructed to call 911 for emergencies.      1. MDD without psychotic features H/O Bipolar disorder  - Continue Abilify  to 10 mg daily   2. PTSD/Generalized anxiety disorder   - Continue buspirone  to 10 mg 3 times daily - Start hydroxyzine  10 mg 3 times daily as needed for anxiety/panic attacks   Chief Complaint:  Chief Complaint  Patient presents with   Follow-up   Medication Refill   Depression   Anxiety   HPI:   Bay Jarquin is a 43 year old female with a past psychiatric history significant for possible schizophrenia, bipolar disorder, depression, and PTSD who presents to [facility] for follow-up. Today, she presented via telepsychiatry appointment.  She reported  her mood as not good .  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  She reported feeling depressed  with decreased energy, decreased interest daily activities, reduced concentration, reduced appetite at times and disturbed sleep, denied any active or passive SI/HI/AVH.  She also reported anxiety due to the above psychosocial stressors associated with panic attacks at times with symptoms of breathlessness, increased sweating.  Reported that I wish I could go back to my previous work.  She reported that she has been taking all the medications as prescribed, denied any side effects or any new physical concerns.  Stated that I was more scared after taking medicines at work .  Stated I just do not know, I do not feel happy .  Reported that she was on therapy, had 1 session however could not attend other appointments due to work, encouraged her to keep up with therapy.  She reported smoking marijuana stated I do not smoke weed as much as I did , smoking cigarettes but denied using any other substances. We discussed about starting hydroxyzine  10 mg 3 times daily as needed for intense anxiety and panic attacks in addition to buspirone  and Abilify .  Discussed the risk benefits and side effects of medications.  Encouraged her sobriety from using substances.  Plan to have her back in the clinic in 6 weeks.  Past Psychiatric History:  Patient with a past psychiatric history significant for bipolar disorder, depression, panic attacks, and possible schizophrenia.   Patient has a past history of hospitalization due to mental health.  Patient reports that she was last hospitalized in 2010 at [facility] ED after having a nervous breakdown shortly after having twins.   Patient denies a past history of suicide attempt.   Patient denies a past history of homicide attempt.  Past Medical History:  Past Medical History:  Diagnosis Date   Anemia    Depression    Gestational diabetes mellitus    gestational only   H/O cesarean section 2010   Headache    migraines   Hemorrhoids    Hypertension    Panic  attacks    PTSD (post-traumatic stress disorder)    Schizophrenia (HCC)    Vaginal delivery 2004 , 2008, 2009    Past Surgical History:  Procedure Laterality Date   CESAREAN SECTION     COLONOSCOPY WITH PROPOFOL   08/19/2017   Procedure: COLONOSCOPY WITH PROPOFOL ;  Surgeon: Teresa Lonni HERO, MD;  Location: [facility];  Service: General;;   COLONOSCOPY WITH PROPOFOL  N/A 02/21/2018   Procedure: COLONOSCOPY WITH PROPOFOL ;  Surgeon: Abran Norleen SAILOR, MD;  Location: [facility];  Service: Endoscopy;  Laterality: N/A;   EVALUATION UNDER ANESTHESIA WITH HEMORRHOIDECTOMY N/A 02/26/2018   Procedure: EXAM UNDER ANESTHESIA WITH HEMORRHOIDAL LIGATION AND PEXY, EXTERNAL HEMORRHOIDECTOMY X2;  Surgeon: Sheldon Standing, MD;  Location: [facility];  Service: General;  Laterality: N/A;   FLEXIBLE SIGMOIDOSCOPY  09/25/2017   Procedure: FLEXIBLE SIGMOIDOSCOPY;  Surgeon: Teresa Lonni HERO, MD;  Location: [facility];  Service: General;;   i and d perirectal abcess  2016   INCISION AND DRAINAGE PERIRECTAL ABSCESS N/A 05/28/2015   Procedure: IRRIGATION AND DEBRIDEMENT PERIRECTAL ABSCESS;  Surgeon: Krystal Spinner, MD;  Location: [facility];  Service: General;  Laterality: N/A;   IR RADIOLOGIST EVAL & MGMT  04/02/2017   IR RADIOLOGIST EVAL &  MGMT  04/15/2018   IR RADIOLOGIST EVAL & MGMT  04/29/2018   TUBAL LIGATION  2010   XI ROBOTIC ASSISTED LOWER ANTERIOR RESECTION N/A 02/26/2018   Procedure: XI ROBOTIC ASSISTED LYSIS OF ADHESIONS, RETRORECTAL RESECTION OF PRESACRAL CYSTIC MASS, FIREFLY INJECTION;  Surgeon: Sheldon Standing, MD;  Location: [facility];  Service: General;  Laterality: N/A;    Family Psychiatric History:  Brother - ADHD Son - ODD, DMDD, ADHD, and mentally delayed Son (3rd oldest) - DMDD, ADHD    Family History:  Family History  Problem Relation Age of Onset   Hypertension Mother    Hypertension Other     Social History:  Social History   Socioeconomic History   Marital status: Single    Spouse name: Not on  file   Number of children: Not on file   Years of education: Not on file   Highest education level: Not on file  Occupational History   Not on file  Tobacco Use   Smoking status: Every Day    Current packs/day: 0.50    Types: Cigarettes   Smokeless tobacco: Never  Vaping Use   Vaping status: Never Used  Substance and Sexual Activity   Alcohol use: Yes    Comment: ocassionally   Drug use: Not Currently    Frequency: 3.0 times per week    Types: Marijuana    Comment: occasionally   Sexual activity: Yes  Other Topics Concern   Not on file  Social History Narrative   Not on file   Social Drivers of Health   Financial Resource Strain: Not on file  Food Insecurity: Not on file  Transportation Needs: Not on file  Physical Activity: Not on file  Stress: Not on file  Social Connections: Not on file    Allergies: No Known Allergies  Current Medications: Current Outpatient Medications  Medication Sig Dispense Refill   acetaminophen  (TYLENOL  8 HOUR) 650 MG CR tablet Take 1 tablet (650 mg total) by mouth every 8 (eight) hours as needed for pain or fever. 30 tablet 0   ARIPiprazole  (ABILIFY ) 10 MG tablet Take 1 tablet (10 mg total) by mouth daily. 30 tablet 1   busPIRone  (BUSPAR ) 10 MG tablet Take 1 tablet (10 mg total) by mouth 3 (three) times daily. 45 tablet 1   hydrOXYzine  (ATARAX ) 10 MG tablet Take 1 tablet (10 mg total) by mouth 3 (three) times daily as needed. 75 tablet 1   ibuprofen  (ADVIL ) 800 MG tablet Take 1 tablet (800 mg total) by mouth every 8 (eight) hours as needed. 30 tablet 0   ibuprofen  (ADVIL ) 800 MG tablet Take 1 tablet (800 mg total) by mouth every 8 (eight) hours as needed for up to 10 days. 30 tablet 0   lisinopril  (ZESTRIL ) 10 MG tablet Take 1 tablet (10 mg total) by mouth daily. 30 tablet 11   loperamide  (IMODIUM  A-D) 2 MG tablet Take 1 tablet (2 mg total) by mouth 4 (four) times daily as needed for diarrhea or loose stools. 30 tablet 0   metroNIDAZOLE   (FLAGYL ) 500 MG tablet Take 1 tablet (500 mg total) by mouth 2 (two) times daily. 14 tablet 0   naproxen  (NAPROSYN ) 500 MG tablet Take 1 tablet (500 mg total) by mouth 2 (two) times daily. 20 tablet 0   nitrofurantoin , macrocrystal-monohydrate, (MACROBID ) 100 MG capsule Take 1 capsule (100 mg total) by mouth 2 (two) times daily. 10 capsule 0   omeprazole  (PRILOSEC) 40 MG capsule Take 1 capsule (40  mg total) by mouth daily. 30 capsule 0   ondansetron  (ZOFRAN -ODT) 4 MG disintegrating tablet Take 1 tablet (4 mg total) by mouth every 8 (eight) hours as needed. 20 tablet 0   No current facility-administered medications for this visit.     Musculoskeletal: Strength & Muscle Tone: within normal limits Gait & Station: normal Patient leans: N/A  Psychiatric Specialty Exam: There were no vitals taken for this visit.There is no height or weight on file to calculate BMI. Review of Systems  Constitutional:  Negative for activity change, appetite change, chills, diaphoresis and fatigue.  HENT:  Negative for congestion, dental problem, drooling, ear discharge and ear pain.   Eyes:  Negative for pain, discharge and itching.  Respiratory:  Negative for apnea, cough, choking and chest tightness.   Cardiovascular:  Negative for chest pain, palpitations and leg swelling.  Gastrointestinal:  Negative for abdominal distention, abdominal pain, constipation, diarrhea and nausea.  Endocrine: Negative for cold intolerance and heat intolerance.  Genitourinary:  Negative for difficulty urinating, dysuria, flank pain, frequency, hematuria and urgency.  Musculoskeletal:  Negative for arthralgias, back pain, gait problem, joint swelling, myalgias and neck pain.  Skin:  Negative for color change and pallor.  Allergic/Immunologic: Negative for environmental allergies and food allergies.  Neurological:  Negative for dizziness, seizures, syncope, facial asymmetry, speech difficulty, light-headedness, numbness and  headaches.  Psychiatric/Behavioral:  Negative for agitation, behavioral problems, confusion, decreased concentration, dysphoric mood, hallucinations, self-injury, sleep disturbance and suicidal ideas. The patient is not nervous/anxious and is not hyperactive.     General Appearance: Casual  Eye Contact:  Good  Speech:  Normal Rate  Volume:  Normal  Mood:  Depressed  Affect:  Congruent  Thought Content: Logical   Suicidal Thoughts:  No  Homicidal Thoughts:  No  Thought Process:  Coherent  Orientation:  Full (Time, Place, and Person)    Memory: Immediate;   Good Recent;   Good Remote;   Good  Judgment:  Fair  Insight:  Fair  Concentration:  Concentration: Good and Attention Span: Good  Recall:  not formally assessed   Fund of Knowledge: Good  Language: Good  Psychomotor Activity:  Normal  Akathisia:  No  AIMS (if indicated): not done  Assets:  Communication Skills Desire for Improvement Financial Resources/Insurance Housing Resilience  ADL's:  Intact  Cognition: WNL  Sleep:  Fair   Metabolic Disorder Labs: Lab Results  Component Value Date   HGBA1C 5.2 02/24/2018   MPG 102.54 02/24/2018   No results found for: PROLACTIN No results found for: CHOL, TRIG, HDL, CHOLHDL, VLDL, LDLCALC   Therapeutic Level Labs: No results found for: LITHIUM No results found for: VALPROATE No results found for: CBMZ   Screenings: GAD-7    Flowsheet Row Office Visit from 04/01/2024 in [facility]  Total GAD-7 Score 21   PHQ2-9    Flowsheet Row Clinical Support from 05/08/2024 in [facility] Office Visit from 04[facility]01/2024 in [facility] Office Visit from 04/25/2022 in [facility] - [facility] Of [place] Office Visit from 10/25/2017 in [facility] - [facility] Of [place]. [facility]  PHQ-2 Total Score 6 6 1  0  PHQ-9 Total Score  21 23 11  --   Flowsheet Row ED from 04/18/2024 in [facility] Office Visit from 04/01/2024 in [facility] ED from 02/14/2024 in [facility] Emergency  Department at [facility]  C-SSRS RISK CATEGORY No Risk No Risk No Risk    Collaboration of Care: Collaboration of Care: Other Dr. Jean  Patient/Guardian was advised Release of Information must be obtained prior to any record release in order to collaborate their care with an outside provider. Patient/Guardian was advised if they have not already done so to contact the registration department to sign all necessary forms in order for us  to release information regarding their care.   Consent: Patient/Guardian gives verbal consent for treatment and assignment of benefits for services provided during this visit. Patient/Guardian expressed understanding and agreed to proceed.    Eternity Dexter, MD 09/01/2024, 8:59 AM

## 2024-09-01 ENCOUNTER — Telehealth (INDEPENDENT_AMBULATORY_CARE_PROVIDER_SITE_OTHER): Payer: MEDICAID

## 2024-09-01 DIAGNOSIS — F3132 Bipolar disorder, current episode depressed, moderate: Secondary | ICD-10-CM

## 2024-09-01 DIAGNOSIS — F411 Generalized anxiety disorder: Secondary | ICD-10-CM

## 2024-09-01 MED ORDER — HYDROXYZINE HCL 10 MG PO TABS: 10.0000 mg | ORAL_TABLET | Freq: Three times a day (TID) | ORAL | 1 refills | Status: DC | PRN

## 2024-09-01 MED ORDER — ARIPIPRAZOLE 10 MG PO TABS: 10.0000 mg | ORAL_TABLET | Freq: Every day | ORAL | 1 refills | Status: DC

## 2024-09-01 MED ORDER — BUSPIRONE HCL 10 MG PO TABS: 10.0000 mg | ORAL_TABLET | Freq: Three times a day (TID) | ORAL | 1 refills | Status: DC

## 2024-09-29 ENCOUNTER — Telehealth: Payer: MEDICAID | Admitting: Family Medicine

## 2024-09-29 DIAGNOSIS — M549 Dorsalgia, unspecified: Secondary | ICD-10-CM

## 2024-09-29 NOTE — Progress Notes (Signed)
 [place]  Extreme back pain with migraine as well- worsening- not responding to OTC medications. Given symptoms ED was encourage for eval and assessment  Patient acknowledged agreement and understanding of the plan.

## 2024-09-30 NOTE — Progress Notes (Signed)
 BH MD/PA/NP OP Progress Note  10/13/2024 10:57 AM Stephanie [name]  MRN:  [id number]  Televisit via video: I connected with Stephanie [name] on 10/14/23 at  10:30 AM EST by a video enabled telemedicine application and verified that I am speaking with the correct person using two identifiers.  Location: Patient: Home Provider: Clinic   I discussed the limitations of evaluation and management by telemedicine and the availability of in person appointments. The patient expressed understanding and agreed to proceed.  I discussed the assessment and treatment plan with the patient. The patient was provided an opportunity to ask questions and all were answered. The patient agreed with the plan and demonstrated an understanding of the instructions.   The patient was advised to call back or seek an in-person evaluation if the symptoms worsen or if the condition fails to improve as anticipated.  Visit Diagnosis:    ICD-10-CM   1. Generalized anxiety disorder  F41.1 busPIRone  (BUSPAR ) 10 MG tablet    2. Bipolar affective disorder, currently depressed, moderate (HCC)  F31.32 lamoTRIgine  (LAMICTAL ) 25 MG tablet    ARIPiprazole  (ABILIFY ) 10 MG tablet    Ambulatory referral to [facility] Intensive [facility]        Assessment:   Stephanie [name] is a 44 year old female with a psychiatric history of bipolar disorder, depression, PTSD and possible schizophrenia that presented to the [facility] behavioral health outpatient clinic for follow up virtually.   Patient has continued to have the psychosocial stressors at work, interpersonal conflicts with people that has significantly impacted her mood leading to depression and anxiety symptoms with decreased energy, decreased energy with daily activities, reduced appetite, disturbed sleep, reduced concentration but she is not actively passively homicidal or suicidal.  There are no safety concerns.  She lives with her children 5 at home and prefers virtual  visits.  She has limited coping skills, fair insight into her condition and would significantly benefit from therapy in addition to medication management, rescheduled her for the earliest available appointment with therapist in the clinic.  In addition she would benefit from intensive outpatient program as well, placed a referral today.  She has been smoking marijuana, smoking cigarettes that is also likely contributing to her mood however she is precontemplative to quit it.  She is not using any other substances.  We utilized motivational counseling today, she was emotionally labile during assessment.  Will start her on lamotrigine  at the lowest dose due to sensitivity to vacations in addition to the ongoing management.  Will discontinue hydroxyzine  as she has had side effects, her preference.  Will follow-up with her  in 4 weeks.   Risk Assessment: An assessment of suicide and violence risk factors was performed as part of this evaluation and is not significantly changed from the last visit. While future psychiatric events cannot be accurately predicted, the patient does not  currently require acute inpatient psychiatric care and does not  currently meet [facility]  involuntary commitment criteria. Patient was given contact information for crisis resources, behavioral health clinic and was instructed to call 911 for emergencies.      1. MDD without psychotic features H/O Bipolar disorder  - Continue Abilify  to 10 mg daily - Start Lamotrigine  25 mg daily   2. PTSD/Generalized anxiety disorder   - Continue buspirone  to 10 mg 3 times daily - Patient discontinued hydroxyzine  due to side effects   Chief Complaint:  Chief Complaint  Patient presents with   Medication Refill  Follow-up   HPI:   Stephanie [name] is a 43 year old female with a past psychiatric history significant for possible schizophrenia, bipolar disorder, depression, and PTSD who presents to [facility] for follow-up. Today, she presented via telepsychiatry appointment.  She reported her mood as not good .  Reported that people are not going well at work, I do not like people, I am tired of being mistreated .  Reported that she has been having continual psychosocial stressors at work, work from 7-3, 11-7 sometimes .  Reported that interpersonal conflicts have continued with people at work.  She stated that I have been trying to fill job applications every day.  She denied any active or passive SI/HI/AVH.  She continues to report feeling depressed with decreased energy, decreased interest in daily activities, reduced concentration, decreased appetite and disturbed sleep due to the ongoing psychosocial stressors.  When asked about her depression and anxiety with due to the use she stated probably .  Also endorsed anxiety due to the above-mentioned stressors.  Reported that she took hydroxyzine  it made me more scared of people, reported that she discontinued the medicine.  Reported compliance on the rest of the medicines, denied any side effects.  Stated that I do not want to cook, I do not want to deal with people . Reported that she is continue to smoke marijuana, precontemplative to quit it.  She also reported smoking cigarettes, 1 to 2 packs every day.  She denied using any other substances.  Reported that she would like to resume therapy, scheduled her for the earliest available appointment in the clinic.  Also sent a referral for intensive outpatient program that she would significantly benefit from. We discussed about starting lamotrigine  25 mg for her intense mood swings, emotional lability and history of bipolar disorder in addition to her medications.  Discussed the risk benefits and side effects.  Encouraged her to keep the medicine at the lowest dose due to sensitivity to medicines.  Encouraged her sobriety from using substances.  Will have her back in the clinic in  4 weeks.   Past Psychiatric History:  Patient with a past psychiatric history significant for bipolar disorder, depression, panic attacks, and possible schizophrenia.   Patient has a past history of hospitalization due to mental health.  Patient reports that she was last hospitalized in 2010 at [facility] ED after having a nervous breakdown shortly after having twins.   Patient denies a past history of suicide attempt.   Patient denies a past history of homicide attempt.  Past Medical History:  Past Medical History:  Diagnosis Date   Anemia    Depression    Gestational diabetes mellitus    gestational only   H/O cesarean section 2010   Headache    migraines   Hemorrhoids    Hypertension    Panic attacks    PTSD (post-traumatic stress disorder)    Schizophrenia (HCC)    Vaginal delivery 2004 , 2008, 2009    Past Surgical History:  Procedure Laterality Date   CESAREAN SECTION     COLONOSCOPY WITH PROPOFOL   08/19/2017   Procedure: COLONOSCOPY WITH PROPOFOL ;  Surgeon: Teresa Lonni HERO, MD;  Location: [facility];  Service: General;;   COLONOSCOPY WITH PROPOFOL  N/A 02/21/2018   Procedure: COLONOSCOPY WITH PROPOFOL ;  Surgeon: Abran Norleen SAILOR, MD;  Location: [facility];  Service: Endoscopy;  Laterality: N/A;   EVALUATION UNDER ANESTHESIA WITH HEMORRHOIDECTOMY N/A 02/26/2018   Procedure: ERASMO  UNDER ANESTHESIA WITH HEMORRHOIDAL LIGATION AND PEXY, EXTERNAL HEMORRHOIDECTOMY X2;  Surgeon: Sheldon Standing, MD;  Location: [facility];  Service: General;  Laterality: N/A;   FLEXIBLE SIGMOIDOSCOPY  09/25/2017   Procedure: FLEXIBLE SIGMOIDOSCOPY;  Surgeon: Teresa Lonni HERO, MD;  Location: [facility];  Service: General;;   i and d perirectal abcess  2016   INCISION AND DRAINAGE PERIRECTAL ABSCESS N/A 05/28/2015   Procedure: IRRIGATION AND DEBRIDEMENT PERIRECTAL ABSCESS;  Surgeon: Krystal Spinner, MD;  Location: [facility];  Service: General;  Laterality: N/A;   IR RADIOLOGIST EVAL & MGMT  04/02/2017    IR RADIOLOGIST EVAL & MGMT  04/15/2018   IR RADIOLOGIST EVAL & MGMT  04/29/2018   TUBAL LIGATION  2010   XI ROBOTIC ASSISTED LOWER ANTERIOR RESECTION N/A 02/26/2018   Procedure: XI ROBOTIC ASSISTED LYSIS OF ADHESIONS, RETRORECTAL RESECTION OF PRESACRAL CYSTIC MASS, FIREFLY INJECTION;  Surgeon: Sheldon Standing, MD;  Location: [facility];  Service: General;  Laterality: N/A;    Family Psychiatric History:  Brother - ADHD Son - ODD, DMDD, ADHD, and mentally delayed Son (3rd oldest) - DMDD, ADHD    Family History:  Family History  Problem Relation Age of Onset   Hypertension Mother    Hypertension Other     Social History:  Social History   Socioeconomic History   Marital status: Single    Spouse name: Not on file   Number of children: Not on file   Years of education: Not on file   Highest education level: Not on file  Occupational History   Not on file  Tobacco Use   Smoking status: Every Day    Current packs/day: 0.50    Types: Cigarettes   Smokeless tobacco: Never  Vaping Use   Vaping status: Never Used  Substance and Sexual Activity   Alcohol use: Yes    Comment: ocassionally   Drug use: Not Currently    Frequency: 3.0 times per week    Types: Marijuana    Comment: occasionally   Sexual activity: Yes  Other Topics Concern   Not on file  Social History Narrative   Not on file   Social Drivers of Health   Tobacco Use: High Risk (09/29/2024)   Patient History    Smoking Tobacco Use: Every Day    Smokeless Tobacco Use: Never    Passive Exposure: Not on file  Financial Resource Strain: Not on file  Food Insecurity: Not on file  Transportation Needs: Not on file  Physical Activity: Not on file  Stress: Not on file  Social Connections: Not on file  Depression (PHQ2-9): High Risk (05/08/2024)   Depression (PHQ2-9)    PHQ-2 Score: 21  Alcohol Screen: Not on file  Housing: Not on file  Utilities: Not on file  Health Literacy: Not on file    Allergies: No Known  Allergies  Current Medications: Current Outpatient Medications  Medication Sig Dispense Refill   lamoTRIgine  (LAMICTAL ) 25 MG tablet Take 1 tablet (25 mg total) by mouth daily. 30 tablet 1   acetaminophen  (TYLENOL  8 HOUR) 650 MG CR tablet Take 1 tablet (650 mg total) by mouth every 8 (eight) hours as needed for pain or fever. 30 tablet 0   ARIPiprazole  (ABILIFY ) 10 MG tablet Take 1 tablet (10 mg total) by mouth daily. 30 tablet 1   busPIRone  (BUSPAR ) 10 MG tablet Take 1 tablet (10 mg total) by mouth 3 (three) times daily. 45 tablet 1   ibuprofen  (ADVIL ) 800 MG tablet Take 1  tablet (800 mg total) by mouth every 8 (eight) hours as needed. 30 tablet 0   lisinopril  (ZESTRIL ) 10 MG tablet Take 1 tablet (10 mg total) by mouth daily. 30 tablet 11   loperamide  (IMODIUM  A-D) 2 MG tablet Take 1 tablet (2 mg total) by mouth 4 (four) times daily as needed for diarrhea or loose stools. 30 tablet 0   metroNIDAZOLE  (FLAGYL ) 500 MG tablet Take 1 tablet (500 mg total) by mouth 2 (two) times daily. 14 tablet 0   naproxen  (NAPROSYN ) 500 MG tablet Take 1 tablet (500 mg total) by mouth 2 (two) times daily. 20 tablet 0   nitrofurantoin , macrocrystal-monohydrate, (MACROBID ) 100 MG capsule Take 1 capsule (100 mg total) by mouth 2 (two) times daily. 10 capsule 0   omeprazole  (PRILOSEC) 40 MG capsule Take 1 capsule (40 mg total) by mouth daily. 30 capsule 0   ondansetron  (ZOFRAN -ODT) 4 MG disintegrating tablet Take 1 tablet (4 mg total) by mouth every 8 (eight) hours as needed. 20 tablet 0   No current facility-administered medications for this visit.     Musculoskeletal: Strength & Muscle Tone: within normal limits Gait & Station: normal Patient leans: N/A  Psychiatric Specialty Exam: There were no vitals taken for this visit.There is no height or weight on file to calculate BMI. Review of Systems  Constitutional:  Negative for activity change, appetite change, chills, diaphoresis and fatigue.  HENT:  Negative  for congestion, dental problem, drooling, ear discharge and ear pain.   Eyes:  Negative for pain, discharge and itching.  Respiratory:  Negative for apnea, cough, choking and chest tightness.   Cardiovascular:  Negative for chest pain, palpitations and leg swelling.  Gastrointestinal:  Negative for abdominal distention, abdominal pain, constipation, diarrhea and nausea.  Endocrine: Negative for cold intolerance and heat intolerance.  Genitourinary:  Negative for difficulty urinating, dysuria, flank pain, frequency, hematuria and urgency.  Musculoskeletal:  Negative for arthralgias, back pain, gait problem, joint swelling, myalgias and neck pain.  Skin:  Negative for color change and pallor.  Allergic/Immunologic: Negative for environmental allergies and food allergies.  Neurological:  Negative for dizziness, seizures, syncope, facial asymmetry, speech difficulty, light-headedness, numbness and headaches.  Psychiatric/Behavioral:  Negative for agitation, behavioral problems, confusion, decreased concentration, dysphoric mood, hallucinations, self-injury, sleep disturbance and suicidal ideas. The patient is not nervous/anxious and is not hyperactive.     General Appearance: Casual  Eye Contact:  Good  Speech:  Normal Rate  Volume:  Normal  Mood:  Depressed  Affect:  Congruent  Thought Content: Logical   Suicidal Thoughts:  No  Homicidal Thoughts:  No  Thought Process:  Coherent  Orientation:  Full (Time, Place, and Person)    Memory: Immediate;   Good Recent;   Good Remote;   Good  Judgment:  Fair  Insight:  Fair  Concentration:  Concentration: Good and Attention Span: Good  Recall:  not formally assessed   Fund of Knowledge: Good  Language: Good  Psychomotor Activity:  Normal  Akathisia:  No  AIMS (if indicated): not done  Assets:  Communication Skills Desire for Improvement Financial Resources/Insurance Housing Resilience  ADL's:  Intact  Cognition: WNL  Sleep:  Fair    Metabolic Disorder Labs: Lab Results  Component Value Date   HGBA1C 5.2 02/24/2018   MPG 102.54 02/24/2018   No results found for: PROLACTIN No results found for: CHOL, TRIG, HDL, CHOLHDL, VLDL, LDLCALC   Therapeutic Level Labs: No results found for: LITHIUM No results found for:  VALPROATE No results found for: CBMZ   Screenings: GAD-7    Flowsheet Row Office Visit from 04/01/2024 in [facility]  Total GAD-7 Score 21   PHQ2-9    Flowsheet Row Clinical Support from 05/08/2024 in [facility] Office Visit from 04/01/2024 in [facility] Office Visit from 04/25/2022 in [facility] - [facility] Dept Of [place]. [facility] Office Visit from 10/25/2017 in [facility] - [facility] Dept Of [place]  PHQ-2 Total Score 6 6 1  0  PHQ-9 Total Score 21 23 11  --   Flowsheet Row ED from 04/18/2024 in [facility] Office Visit from 04/01/2024 in [facility] ED from 02/14/2024 in [facility] Emergency Department at [facility]  C-SSRS RISK CATEGORY No Risk No Risk No Risk    Collaboration of Care: Collaboration of Care: Other Dr. Jean  Patient/Guardian was advised Release of Information must be obtained prior to any record release in order to collaborate their care with an outside provider. Patient/Guardian was advised if they have not already done so to contact the registration department to sign all necessary forms in order for us  to release information regarding their care.   Consent: Patient/Guardian gives verbal consent for treatment and assignment of benefits for services provided during this visit. Patient/Guardian expressed understanding and agreed to proceed.    Stony Stegmann, MD 10/13/2024, 10:57 AM

## 2024-10-13 ENCOUNTER — Telehealth (HOSPITAL_COMMUNITY): Payer: MEDICAID

## 2024-10-13 DIAGNOSIS — F411 Generalized anxiety disorder: Secondary | ICD-10-CM

## 2024-10-13 DIAGNOSIS — F3132 Bipolar disorder, current episode depressed, moderate: Secondary | ICD-10-CM

## 2024-10-13 MED ORDER — ARIPIPRAZOLE 10 MG PO TABS: 10.0000 mg | ORAL_TABLET | Freq: Every day | ORAL | 1 refills | Status: AC

## 2024-10-13 MED ORDER — LAMOTRIGINE 25 MG PO TABS: 25.0000 mg | ORAL_TABLET | Freq: Every day | ORAL | 1 refills | Status: AC

## 2024-10-13 MED ORDER — BUSPIRONE HCL 10 MG PO TABS: 10.0000 mg | ORAL_TABLET | Freq: Three times a day (TID) | ORAL | 1 refills | Status: AC

## 2024-10-16 ENCOUNTER — Ambulatory Visit (INDEPENDENT_AMBULATORY_CARE_PROVIDER_SITE_OTHER): Payer: MEDICAID

## 2024-10-16 ENCOUNTER — Encounter (HOSPITAL_COMMUNITY): Payer: Self-pay

## 2024-10-16 DIAGNOSIS — F411 Generalized anxiety disorder: Secondary | ICD-10-CM | POA: Diagnosis not present

## 2024-10-16 NOTE — Progress Notes (Signed)
 "  THERAPIST PROGRESS NOTE  Virtual Visit via Video Note  I connected with Stephanie [name] on 10/16/24 at 11:00 AM EST by a video enabled telemedicine application and verified that I am speaking with the correct person using two identifiers.  Location: Patient: Home Address Provider: Clinician Home Office   I discussed the limitations of evaluation and management by telemedicine and the availability of in person appointments. The patient expressed understanding and agreed to proceed.   I discussed the assessment and treatment plan with the patient. The patient was provided an opportunity to ask questions and all were answered. The patient agreed with the plan and demonstrated an understanding of the instructions.   The patient was advised to call back or seek an in-person evaluation if the symptoms worsen or if the condition fails to improve as anticipated.  I provided 42 minutes of non-face-to-face time during this encounter.   Othel Ada, [facility]   Session Time: 11:05am-11:47am  Participation Level: Active  Behavioral Response: DisheveledAlertEuthymic  Type of Therapy: Individual Therapy  Treatment Goals addressed:    LTG: Mindy will score less than 5 on the Generalized Anxiety Disorder 7 Scale (GAD-7)   STG: Tayllor will practice problem solving skills 3 times per week for the next 4 weeks.   STG: Kahmya will reduce frequency of avoidant behaviors by 50% as evidenced by self-report in therapy sessions  Interventions Review results of GAD-7 with Laneisha to track progress Frequency: Work with Lorenza to identify 3 personal goals for managing their anxiety to work on during current treatment.  Frequency: Work with Lorenza to identify a minimum of 3 alternative coping behaviors to avoidance.  Frequency:    ProgressTowards Goals: Initial  Interventions: Solution Focused and Supportive  Summary: Shanyn is a 45 year old single female that presented today with diagnoses of  Generalized anxiety disorder [F41.1 . Dacia reports not liking people and asking doctor for a pill for people to go away. Flornce reports a misunderstanding with previous job responsibilities. Khianna reports problems with her son's behavior and being disrespectful towards her. Aurilla reports the challenge with no longer working but no it is better for her mental health to not be around people. Shella is worried about what she may do if she gets upset.   Suicidal/Homicidal: None; without intent or plan.   Therapist Response: Clinician met with Zaylynn today for  Generalized anxiety disorder [F41.1 virtual therapy appointment and assessed for safety, medication compliance, and sobriety. Paxton presented for session on time and was alert, oriented x5, with no evidence or self-report of active SI/HI. Leilana denied any abuse of alcohol or illicit substances. Tamia reported compliance with all medication. Clinician inquired about Shela current emotional ratings, as well as any significant changes in thoughts, feelings or behavior since previous check-in. Yaeko current symptoms have included fatigue, irritability, trouble sleeping,increased anger, and tearfulness, with updated PHQ9 screening today rated 19.  Brody reported ongoing issues with anxiety such as difficulty concentrating, irritability, restlessness, lack of sleep, fatigue, and tension, rating a 19 on GAD7 screening. Therapist agreed to work on social ques and navigating people that upset her.     Plan: Return again in 2 weeks.  Diagnosis: No diagnosis found.  Collaboration of Care: current medication management  Patient/Guardian was advised Release of Information must be obtained prior to any record release in order to collaborate their care with an outside provider. Patient/Guardian was advised if they have not already done so to contact the registration department to  sign all necessary forms in order for us  to release information regarding their care.    Consent: Patient/Guardian gives verbal consent for treatment and assignment of benefits for services provided during this visit. Patient/Guardian expressed understanding and agreed to proceed.   Othel Ada, [facility] 10/16/2024  "

## 2024-10-22 ENCOUNTER — Encounter (HOSPITAL_COMMUNITY): Payer: Self-pay

## 2024-10-22 ENCOUNTER — Ambulatory Visit (HOSPITAL_COMMUNITY): Payer: MEDICAID | Admitting: Clinical

## 2024-10-26 ENCOUNTER — Ambulatory Visit (HOSPITAL_COMMUNITY): Payer: MEDICAID | Admitting: Clinical

## 2024-10-29 ENCOUNTER — Ambulatory Visit (INDEPENDENT_AMBULATORY_CARE_PROVIDER_SITE_OTHER): Payer: MEDICAID

## 2024-10-29 DIAGNOSIS — F4010 Social phobia, unspecified: Secondary | ICD-10-CM

## 2024-10-29 NOTE — Progress Notes (Signed)
 "  THERAPIST PROGRESS NOTE  Virtual Visit via Video Note  I connected with Stephanie [name] on 10/29/24 at  9:00 AM EST by a video enabled telemedicine application and verified that I am speaking with the correct person using two identifiers.  Location: Patient: Home Address Provider: Clinician Home Office   I discussed the limitations of evaluation and management by telemedicine and the availability of in person appointments. The patient expressed understanding and agreed to proceed.     I discussed the assessment and treatment plan with the patient. The patient was provided an opportunity to ask questions and all were answered. The patient agreed with the plan and demonstrated an understanding of the instructions.   The patient was advised to call back or seek an in-person evaluation if the symptoms worsen or if the condition fails to improve as anticipated.  I provided 44 minutes of non-face-to-face time during this encounter.   Stephanie [name], [facility]   Session Time: 9:12- 9:56am  Participation Level: Active  Behavioral Response: CasualAlertEuthymic  Type of Therapy: Individual Therapy  Treatment Goals addressed:   LTG: Stephanie [name] will score less than 5 on the Generalized Anxiety Disorder 7 Scale (GAD-7)    STG: Stephanie [name] will practice problem solving skills 3 times per week for the next 4 weeks.    STG: Stephanie [name] will reduce frequency of avoidant behaviors by 50% as evidenced by self-report in therapy sessions   Interventions Review results of GAD-7 with Stephanie [name] to track progress Frequency: Work with Stephanie [name] to identify 3 personal goals for managing their anxiety to work on during current treatment.  Frequency: Work with Stephanie [name] to identify a minimum of 3 alternative coping behaviors to avoidance.    ProgressTowards Goals: Progressing  Interventions: CBT and Supportive  Summary: Stephanie [name] is a 44 year old single female that presented today with diagnoses of  Social anxiety disorder  [F40.10 .  Stephanie [name] is emotional and crying during the session. Stephanie [name] reports trying to manage when people made her upset. Stephanie [name] reflects on previous job and the impact that it has had on her mental health. Stephanie [name] reports not being use to un-professionalism on a job,  Stephanie [name] reflects on the lack of support that she's received from previous job, police, and family.   Suicidal/Homicidal: None; without intent or plan.   Therapist Response: Clinician met with Stephanie [name] today for  virtual therapy appointment and assessed for safety, medication compliance, and sobriety. Stephanie [name] presented for session on time and was alert, oriented x5, with no evidence or self-report of active SI/HI. Stephanie [name] denied any abuse of alcohol or illicit substances. Stephanie [name] reported compliance with all medication. Clinician inquired about Stephanie [name] current emotional ratings, as well as any significant changes in thoughts, feelings or behavior since previous check-in. Therapist encourages Stephanie [name] to give herself grace about her choosing a better job.  Client did not know what was going to be her experience on the job. Therapist provided client an assignment create a 3 day schedule and look up things you would like to learn or do.   Plan: Return again in 2 weeks.  Diagnosis: Social Anxiety Disorder  Collaboration of Care: currently taking medication management  Patient/Guardian was advised Release of Information must be obtained prior to any record release in order to collaborate their care with an outside provider. Patient/Guardian was advised if they have not already done so to contact the registration department to sign all necessary forms in order for us  to release information regarding their care.   Consent:  Patient/Guardian gives verbal consent for treatment and assignment of benefits for services provided during this visit. Patient/Guardian expressed understanding and agreed to proceed.   Stephanie [name], [facility] 10/29/2024  "

## 2024-11-05 ENCOUNTER — Ambulatory Visit: Payer: MEDICAID | Admitting: Family

## 2024-11-12 ENCOUNTER — Ambulatory Visit (HOSPITAL_COMMUNITY): Payer: MEDICAID
